# Patient Record
Sex: Male | Born: 1940 | Race: White | Hispanic: No | Marital: Married | Smoking: Former smoker
Health system: Southern US, Community
[De-identification: ages and names within clinical notes are randomized; demographics above are authoritative.]

## PROBLEM LIST (undated history)

## (undated) DIAGNOSIS — E669 Obesity, unspecified: Secondary | ICD-10-CM

## (undated) DIAGNOSIS — G8929 Other chronic pain: Secondary | ICD-10-CM

## (undated) DIAGNOSIS — G4733 Obstructive sleep apnea (adult) (pediatric): Secondary | ICD-10-CM

## (undated) DIAGNOSIS — R569 Unspecified convulsions: Secondary | ICD-10-CM

## (undated) DIAGNOSIS — I69359 Hemiplegia and hemiparesis following cerebral infarction affecting unspecified side: Secondary | ICD-10-CM

## (undated) DIAGNOSIS — F419 Anxiety disorder, unspecified: Secondary | ICD-10-CM

## (undated) DIAGNOSIS — I2510 Atherosclerotic heart disease of native coronary artery without angina pectoris: Secondary | ICD-10-CM

## (undated) DIAGNOSIS — E785 Hyperlipidemia, unspecified: Secondary | ICD-10-CM

## (undated) DIAGNOSIS — M549 Dorsalgia, unspecified: Secondary | ICD-10-CM

## (undated) DIAGNOSIS — I10 Essential (primary) hypertension: Secondary | ICD-10-CM

## (undated) DIAGNOSIS — R7302 Impaired glucose tolerance (oral): Secondary | ICD-10-CM

## (undated) DIAGNOSIS — C189 Malignant neoplasm of colon, unspecified: Secondary | ICD-10-CM

## (undated) DIAGNOSIS — K429 Umbilical hernia without obstruction or gangrene: Secondary | ICD-10-CM

## (undated) DIAGNOSIS — I639 Cerebral infarction, unspecified: Secondary | ICD-10-CM

## (undated) HISTORY — DX: Obesity, unspecified: E66.9

## (undated) HISTORY — DX: Essential (primary) hypertension: I10

## (undated) HISTORY — DX: Impaired glucose tolerance (oral): R73.02

## (undated) HISTORY — DX: Anxiety disorder, unspecified: F41.9

## (undated) HISTORY — PX: HERNIA REPAIR: SHX51

## (undated) HISTORY — DX: Hyperlipidemia, unspecified: E78.5

## (undated) HISTORY — DX: Obstructive sleep apnea (adult) (pediatric): G47.33

## (undated) HISTORY — DX: Atherosclerotic heart disease of native coronary artery without angina pectoris: I25.10

## (undated) HISTORY — DX: Malignant neoplasm of colon, unspecified: C18.9

## (undated) HISTORY — DX: Cerebral infarction, unspecified: I63.9

## (undated) HISTORY — DX: Hemiplegia and hemiparesis following cerebral infarction affecting unspecified side: I69.359

---

## 2002-01-11 DIAGNOSIS — I69359 Hemiplegia and hemiparesis following cerebral infarction affecting unspecified side: Secondary | ICD-10-CM

## 2002-01-11 HISTORY — DX: Hemiplegia and hemiparesis following cerebral infarction affecting unspecified side: I69.359

## 2002-01-18 ENCOUNTER — Inpatient Hospital Stay (HOSPITAL_COMMUNITY)
Admission: AD | Admit: 2002-01-18 | Discharge: 2002-02-19 | Payer: Self-pay | Admitting: Physical Medicine & Rehabilitation

## 2002-01-25 ENCOUNTER — Encounter: Payer: Self-pay | Admitting: Physical Medicine & Rehabilitation

## 2002-03-26 ENCOUNTER — Encounter (HOSPITAL_COMMUNITY)
Admission: RE | Admit: 2002-03-26 | Discharge: 2002-04-25 | Payer: Self-pay | Admitting: Physical Medicine & Rehabilitation

## 2002-04-25 ENCOUNTER — Encounter (HOSPITAL_COMMUNITY)
Admission: RE | Admit: 2002-04-25 | Discharge: 2002-05-25 | Payer: Self-pay | Admitting: Physical Medicine & Rehabilitation

## 2002-05-28 ENCOUNTER — Encounter (HOSPITAL_COMMUNITY)
Admission: RE | Admit: 2002-05-28 | Discharge: 2002-06-27 | Payer: Self-pay | Admitting: Physical Medicine & Rehabilitation

## 2002-06-25 ENCOUNTER — Encounter (HOSPITAL_COMMUNITY)
Admission: RE | Admit: 2002-06-25 | Discharge: 2002-07-25 | Payer: Self-pay | Admitting: Physical Medicine & Rehabilitation

## 2002-07-25 ENCOUNTER — Encounter (HOSPITAL_COMMUNITY)
Admission: RE | Admit: 2002-07-25 | Discharge: 2002-08-24 | Payer: Self-pay | Admitting: Physical Medicine & Rehabilitation

## 2002-08-28 ENCOUNTER — Encounter (HOSPITAL_COMMUNITY)
Admission: RE | Admit: 2002-08-28 | Discharge: 2002-09-27 | Payer: Self-pay | Admitting: Physical Medicine & Rehabilitation

## 2002-09-13 HISTORY — PX: CORONARY ARTERY BYPASS GRAFT: SHX141

## 2002-09-28 ENCOUNTER — Encounter (HOSPITAL_COMMUNITY)
Admission: RE | Admit: 2002-09-28 | Discharge: 2002-10-28 | Payer: Self-pay | Admitting: Physical Medicine & Rehabilitation

## 2002-10-23 ENCOUNTER — Encounter (HOSPITAL_COMMUNITY)
Admission: RE | Admit: 2002-10-23 | Discharge: 2002-11-22 | Payer: Self-pay | Admitting: Physical Medicine & Rehabilitation

## 2003-01-17 ENCOUNTER — Encounter
Admission: RE | Admit: 2003-01-17 | Discharge: 2003-04-17 | Payer: Self-pay | Admitting: Physical Medicine & Rehabilitation

## 2003-05-13 ENCOUNTER — Encounter
Admission: RE | Admit: 2003-05-13 | Discharge: 2003-08-11 | Payer: Self-pay | Admitting: Physical Medicine & Rehabilitation

## 2003-08-28 ENCOUNTER — Encounter
Admission: RE | Admit: 2003-08-28 | Discharge: 2003-11-26 | Payer: Self-pay | Admitting: Physical Medicine & Rehabilitation

## 2003-09-02 ENCOUNTER — Inpatient Hospital Stay (HOSPITAL_COMMUNITY): Admission: EM | Admit: 2003-09-02 | Discharge: 2003-09-18 | Payer: Self-pay | Admitting: *Deleted

## 2003-09-04 ENCOUNTER — Encounter (INDEPENDENT_AMBULATORY_CARE_PROVIDER_SITE_OTHER): Payer: Self-pay | Admitting: Cardiology

## 2003-09-14 HISTORY — PX: OTHER SURGICAL HISTORY: SHX169

## 2003-09-18 ENCOUNTER — Ambulatory Visit (HOSPITAL_COMMUNITY)
Admission: RE | Admit: 2003-09-18 | Discharge: 2003-09-19 | Payer: Self-pay | Admitting: Physical Medicine & Rehabilitation

## 2003-09-18 ENCOUNTER — Inpatient Hospital Stay
Admission: RE | Admit: 2003-09-18 | Discharge: 2003-09-24 | Payer: Self-pay | Admitting: Physical Medicine & Rehabilitation

## 2003-09-24 ENCOUNTER — Ambulatory Visit (HOSPITAL_COMMUNITY)
Admission: RE | Admit: 2003-09-24 | Discharge: 2003-09-24 | Payer: Self-pay | Admitting: Physical Medicine & Rehabilitation

## 2003-10-22 ENCOUNTER — Encounter
Admission: RE | Admit: 2003-10-22 | Discharge: 2003-10-22 | Payer: Self-pay | Admitting: Thoracic Surgery (Cardiothoracic Vascular Surgery)

## 2003-11-14 ENCOUNTER — Encounter
Admission: RE | Admit: 2003-11-14 | Discharge: 2003-11-14 | Payer: Self-pay | Admitting: Thoracic Surgery (Cardiothoracic Vascular Surgery)

## 2003-11-18 ENCOUNTER — Ambulatory Visit (HOSPITAL_COMMUNITY)
Admission: RE | Admit: 2003-11-18 | Discharge: 2003-11-18 | Payer: Self-pay | Admitting: Thoracic Surgery (Cardiothoracic Vascular Surgery)

## 2003-12-05 ENCOUNTER — Encounter
Admission: RE | Admit: 2003-12-05 | Discharge: 2003-12-05 | Payer: Self-pay | Admitting: Thoracic Surgery (Cardiothoracic Vascular Surgery)

## 2004-01-14 ENCOUNTER — Encounter (HOSPITAL_COMMUNITY): Admission: RE | Admit: 2004-01-14 | Discharge: 2004-02-13 | Payer: Self-pay | Admitting: *Deleted

## 2004-02-14 ENCOUNTER — Encounter (HOSPITAL_COMMUNITY): Admission: RE | Admit: 2004-02-14 | Discharge: 2004-03-15 | Payer: Self-pay | Admitting: *Deleted

## 2004-02-25 ENCOUNTER — Encounter
Admission: RE | Admit: 2004-02-25 | Discharge: 2004-05-25 | Payer: Self-pay | Admitting: Physical Medicine & Rehabilitation

## 2004-05-16 ENCOUNTER — Emergency Department (HOSPITAL_COMMUNITY): Admission: EM | Admit: 2004-05-16 | Discharge: 2004-05-16 | Payer: Self-pay | Admitting: Emergency Medicine

## 2004-05-25 ENCOUNTER — Ambulatory Visit (HOSPITAL_COMMUNITY): Admission: RE | Admit: 2004-05-25 | Discharge: 2004-05-25 | Payer: Self-pay | Admitting: General Surgery

## 2004-07-16 ENCOUNTER — Ambulatory Visit: Payer: Self-pay | Admitting: Family Medicine

## 2004-07-22 ENCOUNTER — Encounter
Admission: RE | Admit: 2004-07-22 | Discharge: 2004-09-02 | Payer: Self-pay | Admitting: Physical Medicine & Rehabilitation

## 2004-07-23 ENCOUNTER — Ambulatory Visit: Payer: Self-pay | Admitting: Family Medicine

## 2004-07-27 ENCOUNTER — Ambulatory Visit: Payer: Self-pay | Admitting: Physical Medicine & Rehabilitation

## 2004-09-02 ENCOUNTER — Encounter
Admission: RE | Admit: 2004-09-02 | Discharge: 2004-12-01 | Payer: Self-pay | Admitting: Physical Medicine & Rehabilitation

## 2004-09-09 ENCOUNTER — Ambulatory Visit: Payer: Self-pay | Admitting: Physical Medicine & Rehabilitation

## 2004-11-17 ENCOUNTER — Ambulatory Visit: Payer: Self-pay | Admitting: Family Medicine

## 2005-03-05 ENCOUNTER — Encounter
Admission: RE | Admit: 2005-03-05 | Discharge: 2005-06-03 | Payer: Self-pay | Admitting: Physical Medicine & Rehabilitation

## 2005-03-18 ENCOUNTER — Ambulatory Visit: Payer: Self-pay | Admitting: Family Medicine

## 2005-04-05 ENCOUNTER — Ambulatory Visit: Payer: Self-pay | Admitting: Physical Medicine & Rehabilitation

## 2005-07-05 ENCOUNTER — Ambulatory Visit: Payer: Self-pay | Admitting: Family Medicine

## 2005-08-09 ENCOUNTER — Ambulatory Visit: Payer: Self-pay | Admitting: Family Medicine

## 2005-09-02 ENCOUNTER — Encounter
Admission: RE | Admit: 2005-09-02 | Discharge: 2005-12-01 | Payer: Self-pay | Admitting: Physical Medicine & Rehabilitation

## 2005-09-02 ENCOUNTER — Ambulatory Visit: Payer: Self-pay | Admitting: Physical Medicine & Rehabilitation

## 2005-09-13 HISTORY — PX: OTHER SURGICAL HISTORY: SHX169

## 2005-10-04 ENCOUNTER — Ambulatory Visit: Payer: Self-pay | Admitting: Physical Medicine & Rehabilitation

## 2005-10-08 ENCOUNTER — Encounter (HOSPITAL_COMMUNITY)
Admission: RE | Admit: 2005-10-08 | Discharge: 2005-11-07 | Payer: Self-pay | Admitting: Physical Medicine & Rehabilitation

## 2005-10-12 ENCOUNTER — Ambulatory Visit (HOSPITAL_COMMUNITY): Admission: RE | Admit: 2005-10-12 | Discharge: 2005-10-12 | Payer: Self-pay | Admitting: General Surgery

## 2005-10-12 ENCOUNTER — Encounter (INDEPENDENT_AMBULATORY_CARE_PROVIDER_SITE_OTHER): Payer: Self-pay | Admitting: General Surgery

## 2005-11-01 ENCOUNTER — Inpatient Hospital Stay (HOSPITAL_COMMUNITY): Admission: RE | Admit: 2005-11-01 | Discharge: 2005-11-08 | Payer: Self-pay | Admitting: General Surgery

## 2005-11-01 ENCOUNTER — Encounter (INDEPENDENT_AMBULATORY_CARE_PROVIDER_SITE_OTHER): Payer: Self-pay | Admitting: General Surgery

## 2005-11-30 ENCOUNTER — Emergency Department (HOSPITAL_COMMUNITY): Admission: EM | Admit: 2005-11-30 | Discharge: 2005-11-30 | Payer: Self-pay | Admitting: Emergency Medicine

## 2005-11-30 ENCOUNTER — Emergency Department (HOSPITAL_COMMUNITY): Admission: EM | Admit: 2005-11-30 | Discharge: 2005-12-01 | Payer: Self-pay | Admitting: Emergency Medicine

## 2005-12-01 ENCOUNTER — Ambulatory Visit: Payer: Self-pay | Admitting: Family Medicine

## 2005-12-23 ENCOUNTER — Ambulatory Visit: Payer: Self-pay | Admitting: Physical Medicine & Rehabilitation

## 2005-12-23 ENCOUNTER — Encounter
Admission: RE | Admit: 2005-12-23 | Discharge: 2006-03-23 | Payer: Self-pay | Admitting: Physical Medicine & Rehabilitation

## 2006-01-11 DIAGNOSIS — C189 Malignant neoplasm of colon, unspecified: Secondary | ICD-10-CM

## 2006-01-11 HISTORY — DX: Malignant neoplasm of colon, unspecified: C18.9

## 2006-02-01 ENCOUNTER — Ambulatory Visit: Payer: Self-pay | Admitting: Family Medicine

## 2006-02-03 ENCOUNTER — Ambulatory Visit: Payer: Self-pay | Admitting: Physical Medicine & Rehabilitation

## 2006-04-05 ENCOUNTER — Encounter
Admission: RE | Admit: 2006-04-05 | Discharge: 2006-07-04 | Payer: Self-pay | Admitting: Physical Medicine & Rehabilitation

## 2006-04-05 ENCOUNTER — Ambulatory Visit: Payer: Self-pay | Admitting: Physical Medicine & Rehabilitation

## 2006-04-11 ENCOUNTER — Ambulatory Visit: Payer: Self-pay | Admitting: Family Medicine

## 2006-06-22 ENCOUNTER — Ambulatory Visit: Payer: Self-pay | Admitting: Family Medicine

## 2006-09-20 ENCOUNTER — Ambulatory Visit: Payer: Self-pay | Admitting: Family Medicine

## 2006-09-21 ENCOUNTER — Encounter: Payer: Self-pay | Admitting: Family Medicine

## 2006-09-21 LAB — CONVERTED CEMR LAB
ALT: 16 units/L (ref 0–53)
AST: 18 units/L (ref 0–37)
Albumin: 4.3 g/dL (ref 3.5–5.2)
Alkaline Phosphatase: 40 units/L (ref 39–117)
BUN: 24 mg/dL — ABNORMAL HIGH (ref 6–23)
Bilirubin, Direct: 0.2 mg/dL (ref 0.0–0.3)
CO2: 31 meq/L (ref 19–32)
Calcium: 9.3 mg/dL (ref 8.4–10.5)
Chloride: 101 meq/L (ref 96–112)
Cholesterol: 130 mg/dL (ref 0–200)
Creatinine, Ser: 1.13 mg/dL (ref 0.40–1.50)
Glucose, Bld: 103 mg/dL — ABNORMAL HIGH (ref 70–99)
HDL: 41 mg/dL (ref >39)
Hgb A1c MFr Bld: 5.9 % (ref 4.6–6.1)
Indirect Bilirubin: 0.5 mg/dL (ref 0.0–0.9)
LDL Cholesterol: 67 mg/dL (ref 0–99)
Potassium: 4.3 meq/L (ref 3.5–5.3)
Sodium: 138 meq/L (ref 135–145)
Total Bilirubin: 0.7 mg/dL (ref 0.3–1.2)
Total CHOL/HDL Ratio: 3.2
Total Protein: 7.2 g/dL (ref 6.0–8.3)
Triglycerides: 109 mg/dL (ref <150)
VLDL: 22 mg/dL (ref 0–40)

## 2006-09-26 ENCOUNTER — Encounter
Admission: RE | Admit: 2006-09-26 | Discharge: 2006-12-25 | Payer: Self-pay | Admitting: Physical Medicine & Rehabilitation

## 2006-09-27 ENCOUNTER — Ambulatory Visit (HOSPITAL_COMMUNITY): Admission: RE | Admit: 2006-09-27 | Discharge: 2006-09-27 | Payer: Self-pay | Admitting: Orthopaedic Surgery

## 2006-10-04 ENCOUNTER — Encounter (HOSPITAL_COMMUNITY): Admission: RE | Admit: 2006-10-04 | Discharge: 2006-11-03 | Payer: Self-pay | Admitting: Orthopaedic Surgery

## 2006-11-08 ENCOUNTER — Encounter (HOSPITAL_COMMUNITY): Admission: RE | Admit: 2006-11-08 | Discharge: 2006-12-08 | Payer: Self-pay | Admitting: Orthopaedic Surgery

## 2006-11-15 ENCOUNTER — Encounter (INDEPENDENT_AMBULATORY_CARE_PROVIDER_SITE_OTHER): Payer: Self-pay | Admitting: Specialist

## 2006-11-15 ENCOUNTER — Ambulatory Visit (HOSPITAL_COMMUNITY): Admission: RE | Admit: 2006-11-15 | Discharge: 2006-11-15 | Payer: Self-pay | Admitting: General Surgery

## 2006-12-12 ENCOUNTER — Encounter (HOSPITAL_COMMUNITY): Admission: RE | Admit: 2006-12-12 | Discharge: 2007-01-12 | Payer: Self-pay | Admitting: Orthopaedic Surgery

## 2007-01-04 ENCOUNTER — Ambulatory Visit: Payer: Self-pay | Admitting: Family Medicine

## 2007-01-10 ENCOUNTER — Ambulatory Visit: Payer: Self-pay | Admitting: Family Medicine

## 2007-01-13 ENCOUNTER — Encounter
Admission: RE | Admit: 2007-01-13 | Discharge: 2007-04-13 | Payer: Self-pay | Admitting: Physical Medicine & Rehabilitation

## 2007-01-13 ENCOUNTER — Ambulatory Visit: Payer: Self-pay | Admitting: Physical Medicine & Rehabilitation

## 2007-03-09 ENCOUNTER — Ambulatory Visit: Payer: Self-pay | Admitting: Physical Medicine & Rehabilitation

## 2007-03-22 ENCOUNTER — Encounter: Payer: Self-pay | Admitting: Family Medicine

## 2007-03-22 LAB — CONVERTED CEMR LAB
ALT: 16 units/L (ref 0–53)
AST: 16 units/L (ref 0–37)
Albumin: 4.6 g/dL (ref 3.5–5.2)
Alkaline Phosphatase: 36 units/L — ABNORMAL LOW (ref 39–117)
BUN: 26 mg/dL — ABNORMAL HIGH (ref 6–23)
Basophils Absolute: 0 10*3/uL (ref 0.0–0.1)
Basophils Relative: 0 % (ref 0–1)
Bilirubin, Direct: 0.1 mg/dL (ref 0.0–0.3)
CO2: 25 meq/L (ref 19–32)
Calcium: 9.6 mg/dL (ref 8.4–10.5)
Chloride: 104 meq/L (ref 96–112)
Cholesterol: 171 mg/dL (ref 0–200)
Creatinine, Ser: 1.24 mg/dL (ref 0.40–1.50)
Eosinophils Absolute: 0.2 10*3/uL (ref 0.0–0.7)
Eosinophils Relative: 3 % (ref 0–5)
Glucose, Bld: 99 mg/dL (ref 70–99)
HCT: 46 % (ref 39.0–52.0)
HDL: 52 mg/dL (ref >39)
Hemoglobin: 14.9 g/dL (ref 13.0–17.0)
Indirect Bilirubin: 0.3 mg/dL (ref 0.0–0.9)
LDL Cholesterol: 95 mg/dL (ref 0–99)
Lymphocytes Relative: 22 % (ref 12–46)
Lymphs Abs: 2 10*3/uL (ref 0.7–3.3)
MCHC: 32.4 g/dL (ref 30.0–36.0)
MCV: 93.9 fL (ref 78.0–100.0)
Monocytes Absolute: 0.6 10*3/uL (ref 0.2–0.7)
Monocytes Relative: 7 % (ref 3–11)
Neutro Abs: 6.1 10*3/uL (ref 1.7–7.7)
Neutrophils Relative %: 68 % (ref 43–77)
Platelets: 186 10*3/uL (ref 150–400)
Potassium: 4.4 meq/L (ref 3.5–5.3)
RBC: 4.9 M/uL (ref 4.22–5.81)
RDW: 13.6 % (ref 11.5–14.0)
Sodium: 141 meq/L (ref 135–145)
TSH: 2.356 microintl units/mL (ref 0.350–5.50)
Total Bilirubin: 0.4 mg/dL (ref 0.3–1.2)
Total CHOL/HDL Ratio: 3.3
Total Protein: 7.4 g/dL (ref 6.0–8.3)
Triglycerides: 118 mg/dL (ref <150)
VLDL: 24 mg/dL (ref 0–40)
WBC: 8.9 10*3/uL (ref 4.0–10.5)

## 2007-05-08 ENCOUNTER — Encounter
Admission: RE | Admit: 2007-05-08 | Discharge: 2007-06-13 | Payer: Self-pay | Admitting: Physical Medicine & Rehabilitation

## 2007-05-08 ENCOUNTER — Ambulatory Visit: Payer: Self-pay | Admitting: Physical Medicine & Rehabilitation

## 2007-06-14 ENCOUNTER — Ambulatory Visit: Payer: Self-pay | Admitting: Family Medicine

## 2007-06-14 LAB — CONVERTED CEMR LAB: PSA: 1.88 ng/mL (ref 0.10–4.00)

## 2007-08-08 ENCOUNTER — Ambulatory Visit: Payer: Self-pay | Admitting: Physical Medicine & Rehabilitation

## 2007-08-08 ENCOUNTER — Encounter
Admission: RE | Admit: 2007-08-08 | Discharge: 2007-08-15 | Payer: Self-pay | Admitting: Physical Medicine & Rehabilitation

## 2007-09-26 ENCOUNTER — Encounter: Payer: Self-pay | Admitting: Family Medicine

## 2007-09-26 LAB — CONVERTED CEMR LAB
ALT: 20 units/L (ref 0–53)
AST: 17 units/L (ref 0–37)
Albumin: 4.4 g/dL (ref 3.5–5.2)
Alkaline Phosphatase: 41 units/L (ref 39–117)
BUN: 24 mg/dL — ABNORMAL HIGH (ref 6–23)
Bilirubin, Direct: 0.1 mg/dL (ref 0.0–0.3)
CO2: 26 meq/L (ref 19–32)
Calcium: 9.4 mg/dL (ref 8.4–10.5)
Chloride: 103 meq/L (ref 96–112)
Cholesterol: 163 mg/dL (ref 0–200)
Creatinine, Ser: 1.14 mg/dL (ref 0.40–1.50)
Glucose, Bld: 100 mg/dL — ABNORMAL HIGH (ref 70–99)
HDL: 47 mg/dL (ref >39)
Indirect Bilirubin: 0.5 mg/dL (ref 0.0–0.9)
LDL Cholesterol: 90 mg/dL (ref 0–99)
Potassium: 4.3 meq/L (ref 3.5–5.3)
Sodium: 140 meq/L (ref 135–145)
Total Bilirubin: 0.6 mg/dL (ref 0.3–1.2)
Total CHOL/HDL Ratio: 3.5
Total Protein: 7.1 g/dL (ref 6.0–8.3)
Triglycerides: 131 mg/dL (ref <150)
VLDL: 26 mg/dL (ref 0–40)

## 2007-10-08 ENCOUNTER — Emergency Department (HOSPITAL_COMMUNITY): Admission: EM | Admit: 2007-10-08 | Discharge: 2007-10-08 | Payer: Self-pay | Admitting: Emergency Medicine

## 2007-11-06 ENCOUNTER — Ambulatory Visit (HOSPITAL_COMMUNITY): Admission: RE | Admit: 2007-11-06 | Discharge: 2007-11-06 | Payer: Self-pay | Admitting: *Deleted

## 2007-11-09 ENCOUNTER — Ambulatory Visit: Payer: Self-pay | Admitting: Family Medicine

## 2008-01-31 ENCOUNTER — Emergency Department (HOSPITAL_COMMUNITY): Admission: EM | Admit: 2008-01-31 | Discharge: 2008-01-31 | Payer: Self-pay | Admitting: Emergency Medicine

## 2008-02-14 ENCOUNTER — Ambulatory Visit: Payer: Self-pay | Admitting: Family Medicine

## 2008-02-21 DIAGNOSIS — I2510 Atherosclerotic heart disease of native coronary artery without angina pectoris: Secondary | ICD-10-CM

## 2008-02-21 DIAGNOSIS — C189 Malignant neoplasm of colon, unspecified: Secondary | ICD-10-CM | POA: Insufficient documentation

## 2008-02-21 DIAGNOSIS — G4733 Obstructive sleep apnea (adult) (pediatric): Secondary | ICD-10-CM | POA: Insufficient documentation

## 2008-02-21 DIAGNOSIS — I10 Essential (primary) hypertension: Secondary | ICD-10-CM

## 2008-02-21 DIAGNOSIS — E785 Hyperlipidemia, unspecified: Secondary | ICD-10-CM

## 2008-02-21 DIAGNOSIS — G4730 Sleep apnea, unspecified: Secondary | ICD-10-CM

## 2008-02-21 DIAGNOSIS — E669 Obesity, unspecified: Secondary | ICD-10-CM | POA: Insufficient documentation

## 2008-05-29 ENCOUNTER — Ambulatory Visit: Payer: Self-pay | Admitting: Family Medicine

## 2008-05-29 LAB — CONVERTED CEMR LAB: Glucose, Bld: 102 mg/dL

## 2008-06-06 ENCOUNTER — Encounter: Payer: Self-pay | Admitting: Family Medicine

## 2008-06-10 ENCOUNTER — Encounter: Payer: Self-pay | Admitting: Family Medicine

## 2008-06-13 ENCOUNTER — Encounter: Payer: Self-pay | Admitting: Family Medicine

## 2008-06-14 ENCOUNTER — Encounter: Payer: Self-pay | Admitting: Family Medicine

## 2008-07-05 ENCOUNTER — Encounter: Payer: Self-pay | Admitting: Family Medicine

## 2008-07-05 LAB — CONVERTED CEMR LAB: PSA: 2.67 ng/mL (ref 0.10–4.00)

## 2008-07-11 ENCOUNTER — Telehealth: Payer: Self-pay | Admitting: Family Medicine

## 2008-07-29 ENCOUNTER — Encounter: Payer: Self-pay | Admitting: Family Medicine

## 2008-08-05 ENCOUNTER — Encounter: Payer: Self-pay | Admitting: Family Medicine

## 2008-08-27 ENCOUNTER — Ambulatory Visit: Payer: Self-pay | Admitting: Family Medicine

## 2008-08-30 ENCOUNTER — Encounter: Payer: Self-pay | Admitting: Family Medicine

## 2008-09-24 ENCOUNTER — Encounter: Payer: Self-pay | Admitting: Family Medicine

## 2008-10-28 ENCOUNTER — Encounter: Payer: Self-pay | Admitting: Family Medicine

## 2008-11-05 ENCOUNTER — Ambulatory Visit: Payer: Self-pay | Admitting: Family Medicine

## 2008-11-05 DIAGNOSIS — I69959 Hemiplegia and hemiparesis following unspecified cerebrovascular disease affecting unspecified side: Secondary | ICD-10-CM

## 2008-11-06 LAB — CONVERTED CEMR LAB
Basophils Absolute: 0 10*3/uL (ref 0.0–0.1)
Basophils Relative: 0 % (ref 0–1)
Eosinophils Absolute: 0.2 10*3/uL (ref 0.0–0.7)
Eosinophils Relative: 2 % (ref 0–5)
HCT: 46.4 % (ref 39.0–52.0)
Hemoglobin: 15.6 g/dL (ref 13.0–17.0)
Lymphocytes Relative: 19 % (ref 12–46)
Lymphs Abs: 1.5 10*3/uL (ref 0.7–4.0)
MCHC: 33.6 g/dL (ref 30.0–36.0)
MCV: 91.9 fL (ref 78.0–100.0)
Monocytes Absolute: 0.6 10*3/uL (ref 0.1–1.0)
Monocytes Relative: 8 % (ref 3–12)
Neutro Abs: 5.6 10*3/uL (ref 1.7–7.7)
Neutrophils Relative %: 71 % (ref 43–77)
Platelets: 176 10*3/uL (ref 150–400)
RBC: 5.05 M/uL (ref 4.22–5.81)
RDW: 13.4 % (ref 11.5–15.5)
TSH: 2.281 microintl units/mL (ref 0.350–4.50)
WBC: 7.9 10*3/uL (ref 4.0–10.5)

## 2008-11-18 ENCOUNTER — Encounter: Payer: Self-pay | Admitting: Family Medicine

## 2008-11-26 ENCOUNTER — Encounter
Admission: RE | Admit: 2008-11-26 | Discharge: 2008-11-27 | Payer: Self-pay | Admitting: Physical Medicine & Rehabilitation

## 2008-11-27 ENCOUNTER — Ambulatory Visit: Payer: Self-pay | Admitting: Physical Medicine & Rehabilitation

## 2008-12-02 ENCOUNTER — Encounter: Payer: Self-pay | Admitting: Family Medicine

## 2008-12-02 LAB — CONVERTED CEMR LAB
OCCULT 1: NEGATIVE
OCCULT 2: NEGATIVE
OCCULT 3: NEGATIVE

## 2008-12-10 ENCOUNTER — Telehealth: Payer: Self-pay | Admitting: Family Medicine

## 2008-12-17 ENCOUNTER — Encounter: Payer: Self-pay | Admitting: Family Medicine

## 2008-12-17 ENCOUNTER — Telehealth: Payer: Self-pay | Admitting: Family Medicine

## 2009-01-02 ENCOUNTER — Encounter: Payer: Self-pay | Admitting: Family Medicine

## 2009-01-13 ENCOUNTER — Encounter
Admission: RE | Admit: 2009-01-13 | Discharge: 2009-01-13 | Payer: Self-pay | Admitting: Physical Medicine & Rehabilitation

## 2009-01-13 ENCOUNTER — Ambulatory Visit: Payer: Self-pay | Admitting: Physical Medicine & Rehabilitation

## 2009-01-17 ENCOUNTER — Telehealth: Payer: Self-pay | Admitting: Family Medicine

## 2009-01-21 ENCOUNTER — Encounter: Payer: Self-pay | Admitting: Family Medicine

## 2009-01-27 ENCOUNTER — Encounter (HOSPITAL_COMMUNITY)
Admission: RE | Admit: 2009-01-27 | Discharge: 2009-02-26 | Payer: Self-pay | Admitting: Physical Medicine & Rehabilitation

## 2009-02-19 ENCOUNTER — Ambulatory Visit: Payer: Self-pay | Admitting: Family Medicine

## 2009-02-27 ENCOUNTER — Encounter: Payer: Self-pay | Admitting: Family Medicine

## 2009-03-05 ENCOUNTER — Encounter
Admission: RE | Admit: 2009-03-05 | Discharge: 2009-06-03 | Payer: Self-pay | Admitting: Physical Medicine & Rehabilitation

## 2009-03-10 ENCOUNTER — Ambulatory Visit: Payer: Self-pay | Admitting: Physical Medicine & Rehabilitation

## 2009-03-21 ENCOUNTER — Encounter: Payer: Self-pay | Admitting: Family Medicine

## 2009-03-24 ENCOUNTER — Telehealth: Payer: Self-pay | Admitting: Family Medicine

## 2009-04-08 ENCOUNTER — Ambulatory Visit: Payer: Self-pay | Admitting: Physical Medicine & Rehabilitation

## 2009-05-28 ENCOUNTER — Encounter
Admission: RE | Admit: 2009-05-28 | Discharge: 2009-05-30 | Payer: Self-pay | Admitting: Physical Medicine & Rehabilitation

## 2009-05-30 ENCOUNTER — Ambulatory Visit: Payer: Self-pay | Admitting: Physical Medicine & Rehabilitation

## 2009-06-24 ENCOUNTER — Ambulatory Visit: Payer: Self-pay | Admitting: Family Medicine

## 2009-06-24 LAB — CONVERTED CEMR LAB: Hgb A1c MFr Bld: 6 %

## 2009-07-01 ENCOUNTER — Encounter: Payer: Self-pay | Admitting: Family Medicine

## 2009-07-09 ENCOUNTER — Encounter: Payer: Self-pay | Admitting: Family Medicine

## 2009-07-10 ENCOUNTER — Encounter: Payer: Self-pay | Admitting: Family Medicine

## 2009-07-14 LAB — CONVERTED CEMR LAB
ALT: 15 units/L (ref 0–53)
AST: 16 units/L (ref 0–37)
Albumin: 4.5 g/dL (ref 3.5–5.2)
Alkaline Phosphatase: 40 units/L (ref 39–117)
BUN: 25 mg/dL — ABNORMAL HIGH (ref 6–23)
Bilirubin, Direct: 0.1 mg/dL (ref 0.0–0.3)
CO2: 25 meq/L (ref 19–32)
Calcium: 9.9 mg/dL (ref 8.4–10.5)
Chloride: 104 meq/L (ref 96–112)
Cholesterol: 145 mg/dL (ref 0–200)
Creatinine, Ser: 1.07 mg/dL (ref 0.40–1.50)
Glucose, Bld: 103 mg/dL — ABNORMAL HIGH (ref 70–99)
HDL: 43 mg/dL (ref >39)
Indirect Bilirubin: 0.5 mg/dL (ref 0.0–0.9)
LDL Cholesterol: 77 mg/dL (ref 0–99)
PSA: 2.52 ng/mL (ref 0.10–4.00)
Potassium: 4.3 meq/L (ref 3.5–5.3)
Sodium: 142 meq/L (ref 135–145)
Total Bilirubin: 0.6 mg/dL (ref 0.3–1.2)
Total CHOL/HDL Ratio: 3.4
Total Protein: 7.1 g/dL (ref 6.0–8.3)
Triglycerides: 124 mg/dL (ref <150)
VLDL: 25 mg/dL (ref 0–40)

## 2009-08-20 ENCOUNTER — Ambulatory Visit: Payer: Self-pay | Admitting: Physical Medicine & Rehabilitation

## 2009-08-20 ENCOUNTER — Encounter
Admission: RE | Admit: 2009-08-20 | Discharge: 2009-09-10 | Payer: Self-pay | Admitting: Physical Medicine & Rehabilitation

## 2009-08-26 ENCOUNTER — Telehealth: Payer: Self-pay | Admitting: Family Medicine

## 2009-11-10 ENCOUNTER — Encounter: Payer: Self-pay | Admitting: Family Medicine

## 2009-11-17 LAB — CONVERTED CEMR LAB
ALT: 17 units/L (ref 0–53)
AST: 19 units/L (ref 0–37)
Albumin: 4.6 g/dL (ref 3.5–5.2)
Alkaline Phosphatase: 41 units/L (ref 39–117)
BUN: 30 mg/dL — ABNORMAL HIGH (ref 6–23)
Basophils Absolute: 0 10*3/uL (ref 0.0–0.1)
Basophils Relative: 0 % (ref 0–1)
Bilirubin, Direct: 0.1 mg/dL (ref 0.0–0.3)
CO2: 29 meq/L (ref 19–32)
Calcium: 9.8 mg/dL (ref 8.4–10.5)
Chloride: 100 meq/L (ref 96–112)
Cholesterol: 231 mg/dL — ABNORMAL HIGH (ref 0–200)
Creatinine, Ser: 0.93 mg/dL (ref 0.40–1.50)
Eosinophils Absolute: 0.2 10*3/uL (ref 0.0–0.7)
Eosinophils Relative: 3 % (ref 0–5)
Glucose, Bld: 76 mg/dL (ref 70–99)
HCT: 47.9 % (ref 39.0–52.0)
HDL: 44 mg/dL (ref >39)
Hemoglobin: 15.5 g/dL (ref 13.0–17.0)
Indirect Bilirubin: 0.5 mg/dL (ref 0.0–0.9)
LDL Cholesterol: 161 mg/dL — ABNORMAL HIGH (ref 0–99)
Lymphocytes Relative: 26 % (ref 12–46)
Lymphs Abs: 2.1 10*3/uL (ref 0.7–4.0)
MCHC: 32.4 g/dL (ref 30.0–36.0)
MCV: 95.2 fL (ref 78.0–100.0)
Monocytes Absolute: 0.7 10*3/uL (ref 0.1–1.0)
Monocytes Relative: 9 % (ref 3–12)
Neutro Abs: 4.9 10*3/uL (ref 1.7–7.7)
Neutrophils Relative %: 62 % (ref 43–77)
Platelets: 188 10*3/uL (ref 150–400)
Potassium: 4.5 meq/L (ref 3.5–5.3)
RBC: 5.03 M/uL (ref 4.22–5.81)
RDW: 14.3 % (ref 11.5–15.5)
Sodium: 141 meq/L (ref 135–145)
Total Bilirubin: 0.6 mg/dL (ref 0.3–1.2)
Total CHOL/HDL Ratio: 5.3
Total Protein: 7 g/dL (ref 6.0–8.3)
Triglycerides: 131 mg/dL (ref <150)
VLDL: 26 mg/dL (ref 0–40)
WBC: 7.9 10*3/uL (ref 4.0–10.5)

## 2009-11-19 ENCOUNTER — Ambulatory Visit: Payer: Self-pay | Admitting: Family Medicine

## 2009-12-15 ENCOUNTER — Encounter: Payer: Self-pay | Admitting: Family Medicine

## 2010-01-07 ENCOUNTER — Encounter
Admission: RE | Admit: 2010-01-07 | Discharge: 2010-01-07 | Payer: Self-pay | Admitting: Physical Medicine & Rehabilitation

## 2010-02-03 ENCOUNTER — Ambulatory Visit (HOSPITAL_COMMUNITY): Admission: RE | Admit: 2010-02-03 | Discharge: 2010-02-03 | Payer: Self-pay | Admitting: General Surgery

## 2010-03-30 ENCOUNTER — Encounter
Admission: RE | Admit: 2010-03-30 | Discharge: 2010-04-07 | Payer: Self-pay | Admitting: Physical Medicine & Rehabilitation

## 2010-04-07 ENCOUNTER — Ambulatory Visit: Payer: Self-pay | Admitting: Physical Medicine & Rehabilitation

## 2010-04-13 ENCOUNTER — Encounter (HOSPITAL_COMMUNITY)
Admission: RE | Admit: 2010-04-13 | Discharge: 2010-05-13 | Payer: Self-pay | Admitting: Physical Medicine & Rehabilitation

## 2010-04-21 ENCOUNTER — Ambulatory Visit: Payer: Self-pay | Admitting: Family Medicine

## 2010-04-28 ENCOUNTER — Telehealth: Payer: Self-pay | Admitting: Family Medicine

## 2010-04-28 LAB — CONVERTED CEMR LAB
Alkaline Phosphatase: 37 units/L — ABNORMAL LOW (ref 39–117)
BUN: 23 mg/dL (ref 6–23)
Bilirubin, Direct: 0.1 mg/dL (ref 0.0–0.3)
CO2: 30 meq/L (ref 19–32)
Chloride: 102 meq/L (ref 96–112)
Creatinine, Ser: 1.15 mg/dL (ref 0.40–1.50)
Glucose, Bld: 95 mg/dL (ref 70–99)
Indirect Bilirubin: 0.5 mg/dL (ref 0.0–0.9)
LDL Cholesterol: 81 mg/dL (ref 0–99)
Potassium: 4.2 meq/L (ref 3.5–5.3)
Total Bilirubin: 0.6 mg/dL (ref 0.3–1.2)
Total Protein: 7 g/dL (ref 6.0–8.3)
Triglycerides: 151 mg/dL — ABNORMAL HIGH (ref <150)
VLDL: 30 mg/dL (ref 0–40)

## 2010-04-30 ENCOUNTER — Ambulatory Visit: Payer: Self-pay | Admitting: Otolaryngology

## 2010-04-30 ENCOUNTER — Ambulatory Visit: Payer: Self-pay | Admitting: Family Medicine

## 2010-04-30 ENCOUNTER — Telehealth: Payer: Self-pay | Admitting: Physician Assistant

## 2010-06-30 ENCOUNTER — Ambulatory Visit: Payer: Self-pay | Admitting: Family Medicine

## 2010-07-24 ENCOUNTER — Ambulatory Visit: Payer: Self-pay | Admitting: Family Medicine

## 2010-07-26 DIAGNOSIS — M549 Dorsalgia, unspecified: Secondary | ICD-10-CM

## 2010-07-27 ENCOUNTER — Encounter: Payer: Self-pay | Admitting: Family Medicine

## 2010-07-27 LAB — CONVERTED CEMR LAB: PSA: 2.17 ng/mL (ref <=4.00)

## 2010-08-03 ENCOUNTER — Encounter: Payer: Self-pay | Admitting: Family Medicine

## 2010-08-18 ENCOUNTER — Telehealth: Payer: Self-pay | Admitting: Family Medicine

## 2010-09-22 ENCOUNTER — Ambulatory Visit: Admit: 2010-09-22 | Payer: Self-pay | Admitting: Physical Medicine & Rehabilitation

## 2010-10-13 NOTE — Assessment & Plan Note (Signed)
Summary: FLU SHOT/SLJ  Nurse Visit   Allergies: 1)  ! * Niaspan  Immunizations Administered:  Influenza Vaccine:    Vaccine Type: Fluvax MCR    Site: right deltoid    Mfr: novartis    Dose: 0.5 ml    Route: IM    Given by: Mauricia Area CMA    Exp. Date: 01/2011    Lot #: [id number]    VIS given: 04/07/10 version given June 30, 2010.  Orders Added: 1)  Influenza Vaccine MCR [00025]   Immunizations Administered:  Influenza Vaccine:    Vaccine Type: Fluvax MCR    Site: right deltoid    Mfr: novartis    Dose: 0.5 ml    Route: IM    Given by: Mauricia Area CMA    Exp. Date: 01/2011    Lot #: [id number]    VIS given: 04/07/10 version given June 30, 2010.

## 2010-10-13 NOTE — Miscellaneous (Signed)
Summary: refill  Clinical Lists Changes  Medications: Changed medication from HYDROCODONE-ACETAMINOPHEN 5-500 MG TABS (HYDROCODONE-ACETAMINOPHEN) Take 1 tablet by mouth three times a day  as needed to HYDROCODONE-ACETAMINOPHEN 5-500 MG TABS (HYDROCODONE-ACETAMINOPHEN) Take 1 tablet by mouth three times a day  as needed - Signed Rx of HYDROCODONE-ACETAMINOPHEN 5-500 MG TABS (HYDROCODONE-ACETAMINOPHEN) Take 1 tablet by mouth three times a day  as needed;  #90 x 0;  Signed;  Entered by: Everitt Amber LPN;  Authorized by: Syliva Overman MD;  Method used: Printed then [facility] to [facility]. [phone]*, [address], [place], [place], [place]  [address], Ph: [phone] or [phone], Fax: [phone]    Prescriptions: HYDROCODONE-ACETAMINOPHEN 5-500 MG TABS (HYDROCODONE-ACETAMINOPHEN) Take 1 tablet by mouth three times a day  as needed  #90 x 0   Entered by:   Everitt Amber LPN   Authorized by:   Syliva Overman MD   Signed by:   Everitt Amber LPN on 52/84/1324   Method used:   Printed then faxed to ...       [facility]  [address] [phone]* (retail)       [address]       [place], [place]  [address]       Ph: [phone] or [phone]       Fax: [phone]   RxID:   [id number]

## 2010-10-13 NOTE — Miscellaneous (Signed)
Summary: med add  Clinical Lists Changes  Medications: Added new medication of CLOTRIMAZOLE-BETAMETHASONE 1-0.05 % CREA (CLOTRIMAZOLE-BETAMETHASONE) Apply topically to affected area two times a day as needed - Signed Rx of CLOTRIMAZOLE-BETAMETHASONE 1-0.05 % CREA (CLOTRIMAZOLE-BETAMETHASONE) Apply topically to affected area two times a day as needed;  #45gm x 0;  Signed;  Entered by: Everitt Amber LPN;  Authorized by: Syliva Overman MD;  Method used: Handwritten    Prescriptions: CLOTRIMAZOLE-BETAMETHASONE 1-0.05 % CREA (CLOTRIMAZOLE-BETAMETHASONE) Apply topically to affected area two times a day as needed  #45gm x 0   Entered by:   Everitt Amber LPN   Authorized by:   Syliva Overman MD   Signed by:   Everitt Amber LPN on 16/06/9603   Method used:   Handwritten   RxID:   [id number]

## 2010-10-13 NOTE — Progress Notes (Signed)
Summary: DR. Suszanne Conners  DR. TEOH   Imported By: Lind Guest 05/07/2010 08:14:48  _____________________________________________________________________  External Attachment:    Type:   Image     Comment:   External Document

## 2010-10-13 NOTE — Assessment & Plan Note (Signed)
Summary: right ear ROOM 1    Vital Signs:  Patient profile:   70 year old male Height:      69 inches Weight:      264.75 pounds BMI:     39.24 O2 Sat:      98 % Pulse rate:   74 / minute Resp:     16 per minute BP sitting:   134 / 78  (left arm) Cuff size:   large  Vitals Entered By: Everitt Amber LPN (April 30, 2010 1:21 PM) CC: Patient was using Q-tip in right ear lastnight and it started bleeding off and on until this morning. He was unable to hear out of ear yesterday during the bleeding but now he can    CC:  Patient was using Q-tip in right ear lastnight and it started bleeding off and on until this morning. He was unable to hear out of ear yesterday during the bleeding but now he can .  History of Present Illness: Pt presents today with his wife.  States he used a Qtip to clean his ear yesterday.  No pain.  Later his wife noticed dried blood in his ear. He has been cleaning it away but continues. Had a hard time hearing this morning until the clot came out.  Has noticed more bleeding today too.  Still no pain.  Pt is on Plavix.  He is aware that he bleeds much easier due to this blood thinner. He is also aware that he shouldnt put anything in his ear.  Allergies: 1)  ! * Niaspan  Past History:  Past medical history reviewed for relevance to current acute and chronic problems.  Past Medical History: Reviewed history from 11/05/2008 and no changes required. 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 ENT:  Denies earache and nasal congestion.  Physical Exam  General:  Well-developed,well-nourished,in no acute distress; alert,appropriate and cooperative throughout examination Head:  Normocephalic and  atraumatic without obvious abnormalities. No apparent alopecia or balding. Ears:  L ear normal.  R EAC mostly obstructed with blood clot.  Dried blood noted too external ear.  Portion of the clot was removed with a curette.  TM appears nl.  There is irrg whitish matter in lower mid section of EAC with surrounding clot.  Uncertain if this is from abrasion, mass or cerumen.  The remainder of the EAC appears nl. Nose:  no external deformity and no nasal discharge.   Neck:  No deformities, masses, or tenderness noted. Lungs:  Normal respiratory effort, chest expands symmetrically. Lungs are clear to auscultation, no crackles or wheezes. Heart:  Normal rate and regular rhythm. S1 and S2 normal without gallop, murmur, click, rub or other extra sounds. Cervical Nodes:  No lymphadenopathy noted   Impression & Recommendations:  Problem # 1:  OPEN WOUND EAR PART UNSPEC WITHOUT MENTION COMP (ICD-872.8) Assessment New Suspect abrasion wound to EAC with persistent bleeding secondary to Plavix. Was able to get pt an appt this afternoon with ENT.  He was to go immed to his office for eval.  Complete Medication List: 1)  [facility] Spectravite Tabs (Multiple vitamins-minerals) .... Take 1 tablet by mouth once a day 2)  Triamterene-hctz 37.5-25 Mg Tabs (Triamterene-hctz) .... Take 1 tablet by mouth once a day 3)  Metoprolol Tartrate 50 Mg Tabs (Metoprolol tartrate) .Marland KitchenMarland KitchenMarland Kitchen  Take one half tab two times a day 4)  Lisinopril 10 Mg Tabs (Lisinopril) .... Take 1 tablet by mouth once a day 5)  Aspirin 81 Mg Tbec (Aspirin) .... Take 2 tablets by mouth once a day 6)  Plavix 75 Mg Tabs (Clopidogrel bisulfate) .... Take 1 tablet by mouth once a day 7)  Klor-con M20 20 Meq Cr-tabs (Potassium chloride crys cr) .... Take two by mouth daily 8)  Fish Oil 1000 Mg Caps (Omega-3 fatty acids) .... Take 2 pills daily 9)  Tizanidine Hcl 4 Mg Tabs (Tizanidine hcl) .... Take one tab every 6 hours 10)  Hydrocodone-acetaminophen 5-500 Mg Tabs  (Hydrocodone-acetaminophen) .... Take 1 tablet by mouth three times a day  as needed 11)  Co Q-10 200 Mg Caps (Coenzyme q10) .... One cap by mouth qd 12)  Gabapentin 300 Mg Caps (Gabapentin) .... One cap by mouth tid 13)  Senna-plus 8.6-50 Mg Tabs (Sennosides-docusate sodium) .... Three tabs by mouth qd 14)  Clotrimazole-betamethasone 1-0.05 % Crea (Clotrimazole-betamethasone) .... Apply topically to affected area two times a day as needed 15)  Simvastatin 20 Mg Tabs (Simvastatin) .... Take 1 tab by mouth at bedtime (take as able)  Patient Instructions: 1)  Keep your next appt with DrSimpson 2)  I have arranged for you to see the ENT dr today. He is going to see you right away.  Go downstairs to [address] - Dr Simpson's old office.

## 2010-10-13 NOTE — Progress Notes (Signed)
Summary: LAB WORK  Phone Note Call from Patient   Summary of Call: WANTS YOU TO FAX HIS WORK TO [facility] WILL PICK UP A COPY OF HIS LAB WORK WANTS YOU TO HAVE IT READY Initial call taken by: Lind Guest,  April 28, 2010 9:22 AM  Follow-up for Phone Call        faxed Follow-up by: Adella Hare LPN,  April 29, 2010 9:06 AM

## 2010-10-13 NOTE — Progress Notes (Signed)
Summary: right ear bleeding  Phone Note Call from Patient   Summary of Call: pt needs to speak with nurse about right ear. cleaned it out yesterday and it started bleeding. bleed off and on through nite. but this morning he can hear and ear not bleeding. [phone] Initial call taken by: Rudene Anda,  April 30, 2010 9:04 AM  Follow-up for Phone Call        patient is on plavix but this morning its not bleeding anymore. You don't need to see him for that unless it starts back bleeding do you? Follow-up by: Everitt Amber LPN,  April 30, 2010 9:24 AM  Additional Follow-up for Phone Call Additional follow up Details #1::        I recommend he have it looked at.  Especially if he is having any pain or discomfort at all in that ear. Additional Follow-up by: Esperanza Sheets PA,  April 30, 2010 9:45 AM    Additional Follow-up for Phone Call Additional follow up Details #2::    Was using a Q-tip lastnight to clean the ear and it started bleeding, he couldn't hear out of that ear from the blood but he thinks it all came out this morning and he can hear again. Made app with Dawn to have it checked to make sure he didn't do any damage  Follow-up by: Everitt Amber LPN,  April 30, 2010 9:50 AM

## 2010-10-13 NOTE — Progress Notes (Signed)
Summary: medication  Phone Note From Other Clinic   Summary of Call: [facility] states you sent them a prescription to them, I am sorry I didn't get the name of the prescription. Initial call taken by: Curtis Sites,  August 18, 2010 9:27 AM    Prescriptions: PLAVIX 75 MG  TABS (CLOPIDOGREL BISULFATE) Take 1 tablet by mouth once a day  #90 x 3   Entered by:   Adella Hare LPN   Authorized by:   Syliva Overman MD   Signed by:   Adella Hare LPN on 34/74/2595   Method used:   Printed then [facility]ed [facility] ...       [facility]  [address] [phone]* (retail)       [address]       [place], [place]  [address]       Ph: [phone] or [phone]       Fax: [phone]   RxID:   [id number]

## 2010-10-13 NOTE — Assessment & Plan Note (Signed)
Summary: FOLLOW UP   Vital Signs:  Patient profile:   70 year old male Height:      69 inches Weight:      267.25 pounds BMI:     39.61 O2 Sat:      97 % Pulse rate:   51 / minute Pulse rhythm:   regular Resp:     16 per minute BP sitting:   120 / 80  (right arm) Cuff size:   large  Vitals Entered By: Everitt Amber LPN (November 19, 9145 11:21 AM)  Nutrition Counseling: Patient's BMI is greater than 25 and therefore counseled on weight management options. CC: Follow up chronic problems Is Patient Diabetic? No   CC:  Follow up chronic problems.  History of Present Illness: Pt  reports that he has started having pain in the legs since trying to resume a cholesterol med, so he is unable to continue the statins. He deneis any recent chest painor palpitations, PND orthopnea or leg swelling. He denies symptoms of uncontrolled depression or anxiety. He denies any redcent feve or chills, head or chest congestion, dysuri or frequency. He denies polyuria, polydypsia, blurredvision.     Current Medications (verified): 1)  [facility] Spectravite   Tabs (Multiple Vitamins-Minerals) .... Take 1 Tablet By Mouth Once A Day 2)  Triamterene-Hctz 37.5-25 Mg  Tabs (Triamterene-Hctz) .... Take 1 Tablet By Mouth Once A Day 3)  Metoprolol Tartrate 50 Mg  Tabs (Metoprolol Tartrate) .... Take One Half Tab Two Times A Day 4)  Lisinopril 10 Mg  Tabs (Lisinopril) .... Take 1 Tablet By Mouth Once A Day 5)  Aspirin 81 Mg  Tbec (Aspirin) .... Take 1 Tablet By Mouth Once A Day 6)  Plavix 75 Mg  Tabs (Clopidogrel Bisulfate) .... Take 1 Tablet By Mouth Once A Day 7)  Klor-Con M20 20 Meq Cr-Tabs (Potassium Chloride Crys Cr) .... Take Two By Mouth Daily 8)  Fish Oil 1000 Mg Caps (Omega-3 Fatty Acids) .... Take 2 Pills Daily 9)  Tizanidine Hcl 4 Mg Tabs (Tizanidine Hcl) .... Take One Tab Every 6 Hours 10)  Hydrocodone-Acetaminophen 5-500 Mg Tabs (Hydrocodone-Acetaminophen) .... Take 1 Tablet By Mouth Three Times A Day  As  Needed 11)  Co Q-10 200 Mg Caps (Coenzyme Q10) .... One Cap By Mouth Qd 12)  Gabapentin 300 Mg Caps (Gabapentin) .... One Cap By Mouth Tid 13)  Senna-Plus 8.6-50 Mg Tabs (Sennosides-Docusate Sodium) .... Three Tabs By Mouth Qd  Allergies (verified): 1)  ! * Niaspan  Review of Systems      See HPI General:  Complains of fatigue and weakness; denies chills, fever, and sweats. Eyes:  Denies blurring and red eye. ENT:  Denies hoarseness, nasal congestion, and sinus pressure. CV:  See HPI. Resp:  Complains of shortness of breath; denies cough and sputum productive. GI:  Denies abdominal pain, constipation, diarrhea, nausea, and vomiting. GU:  Denies dysuria and urinary frequency. MS:  Complains of joint pain, muscle weakness, and stiffness. Psych:  Denies anxiety and depression. Endo:  Denies cold intolerance, excessive hunger, excessive thirst, excessive urination, heat intolerance, polyuria, and weight change.  Physical Exam  General:  alert, well-hydrated, and overweight-appearing.  HEENT: No facial asymmetry,  EOMI, No sinus tenderness, TM's Clear, oropharynx  pink and moist.   Chest: Clear to auscultation bilaterally.  CVS: S1, S2, No murmurs, No S3.   Abd: Soft, Nontender.  MS: decreased ROM spine, hips, shoulders and knees.  Ext: No edema.   CNS: CN  2-12 intactdecreased, power tone and sensation on left upper and lower ext s/p CVA  Skin: Intact, no visible lesions or rashes.  Psych: Good eye contact, normal affect.  Memory intact, not anxious or depressed appearing.     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 Take 1 tablet by mouth once a day    Plavix 75 Mg Tabs (Clopidogrel bisulfate) .Marland Kitchen... 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 Take 1 tablet by mouth once a day    Metoprolol Tartrate 50 Mg Tabs (Metoprolol tartrate) .Marland Kitchen... Take one half tab two times a day    Lisinopril 10 Mg Tabs (Lisinopril) .Marland Kitchen... Take 1 tablet by mouth once a day  BP today: 120/80 Prior BP: 120/80 (06/24/2009)  Labs Reviewed: K+: 4.5 (11/11/2009) Creat: : 0.93 (11/11/2009)   Chol: 231 (11/11/2009)   HDL: 44 (11/11/2009)   LDL: 161 (11/11/2009)   TG: 131 (11/11/2009)  Problem # 5:  HYPERLIPIDEMIA (ICD-272.4) Assessment: Deteriorated  The following medications were removed from the medication list:    Simvastatin 20 Mg Tabs (Simvastatin) .Marland Kitchen... Take 1 tab by mouth at bedtime, pt reprts significant intolerance and will not tak any statin  Labs Reviewed: SGOT: 19 (11/11/2009)   SGPT: 17 (11/11/2009)   HDL:44 (11/11/2009), 43 (07/09/2009)  LDL:161 (11/11/2009), 77 (98/07/9146)  Chol:231 (11/11/2009), 145 (07/09/2009)  Trig:131 (11/11/2009), 124 (07/09/2009)  Problem # 6:  HYPERTENSION (ICD-401.9)  His updated medication list for this problem includes:    Triamterene-hctz 37.5-25 Mg Tabs (Triamterene-hctz) .Marland Kitchen... Take 1 tablet by mouth once a day    Metoprolol Tartrate 50 Mg Tabs (Metoprolol tartrate) .Marland Kitchen... Take one half tab two times a day    Lisinopril 10 Mg Tabs (Lisinopril) .Marland Kitchen... Take 1 tablet by mouth once a day  Complete Medication List: 1)  [facility]vs Spectravite Tabs (Multiple vitamins-minerals) .... Take 1 tablet by mouth once a day 2)  Triamterene-hctz 37.5-25 Mg Tabs (Triamterene-hctz) .... Take 1 tablet by mouth once a day 3)  Metoprolol Tartrate 50 Mg Tabs (Metoprolol tartrate) .... Take one half tab two times a  day 4)  Lisinopril 10 Mg Tabs (Lisinopril) .... Take 1 tablet by mouth once a day 5)  Aspirin 81 Mg Tbec (Aspirin) .... Take 1 tablet by mouth once a day 6)  Plavix 75 Mg Tabs (Clopidogrel bisulfate) .... Take 1 tablet by mouth once a day 7)  Klor-con M20 20 Meq Cr-tabs (Potassium chloride crys cr) .... Take two by mouth daily 8)  Fish Oil 1000 Mg Caps (Omega-3 fatty acids) .... Take 2 pills daily 9)  Tizanidine Hcl 4 Mg Tabs (Tizanidine hcl) .... Take one tab every 6 hours 10)  Hydrocodone-acetaminophen 5-500 Mg Tabs (Hydrocodone-acetaminophen) .... Take 1 tablet by mouth three times a day  as needed 11)  Co Q-10 200 Mg Caps (Coenzyme q10) .... One cap by mouth qd 12)  Gabapentin 300 Mg Caps (Gabapentin) .... One cap by mouth tid 13)  Senna-plus 8.6-50 Mg Tabs (Sennosides-docusate sodium) .... Three  tabs by mouth qd  Patient Instructions: 1)  Please schedule a follow-up appointment in .5 months. 2)  It is important that you exercise regularly at least 20 minutes 5 times a week. If you develop chest pain, have severe difficulty breathing, or feel very tired , stop exercising immediately and seek medical attention. 3)  You need to lose weight. Consider a lower calorie diet and regular exercise.  4)  Your blood pressure is great , no med changes at this time. 5)  pLs discucc the cholesterol with the cardiologist. Prescriptions: METOPROLOL TARTRATE 50 MG  TABS (METOPROLOL TARTRATE) Take ONE HALF tab two times a day  #45 x 3   Entered by:   Everitt Amber LPN   Authorized by:   Syliva Overman MD   Signed by:   Everitt Amber LPN on 85/46/2703   Method used:   [facility]ronically to        [facility] [phone]* (retail)       [address]       [place], [place]  [address]       Ph: [phone] or [phone]       Fax: [phone]   RxID:   [id number]

## 2010-10-13 NOTE — Assessment & Plan Note (Signed)
Summary: office visit   Vital Signs:  Patient profile:   70 year old male Height:      69 inches Weight:      265.25 pounds BMI:     39.31 O2 Sat:      98 % on Room air Pulse rate:   59 / minute Pulse rhythm:   regular Resp:     16 per minute BP sitting:   120 / 70  (right arm)  Vitals Entered By: Mauricia Area CMA (July 24, 2010 10:23 AM)  Nutrition Counseling: Patient's BMI is greater than 25 and therefore counseled on weight management options.  O2 Flow:  Room air CC: Follow up   CC:  Follow up.  History of Present Illness: Reports  that he has been doing fairly well. He unfortunately has not been diligent in dietary change , hence his weight is unchanged, he is a bit more active, since the pain in his legs is less Denies recent fever or chills. Denies sinus pressure, nasal congestion , ear pain or sore throat. Denies chest congestion, or cough productive of sputum. Denies chest pain, palpitations, PND, orthopnea or leg swelling. Denies abdominal pain, nausea, vomitting, diarrhea or constipation. Denies change in bowel movements or bloody stool. Denies dysuria , frequency, incontinence or hesitancy.  Denies headaches, vertigo, seizures. Denies depression, anxiety or insomnia. Denies  rash, lesions, or itch.     Current Medications (verified): 1)  [facility]   Tabs (Multiple Vitamins-Minerals) .... Take 1 Tablet By Mouth Once A Day 2)  Triamterene-Hctz 37.5-25 Mg  Tabs (Triamterene-Hctz) .... Take 1 Tablet By Mouth Once A Day 3)  Metoprolol Tartrate 50 Mg  Tabs (Metoprolol Tartrate) .... Take One Half Tab Two Times A Day 4)  Lisinopril 10 Mg  Tabs (Lisinopril) .... Take 1 Tablet By Mouth Once A Day 5)  Aspirin 81 Mg  Tbec (Aspirin) .... Take 1 Tablet By Mouth Once A Day 6)  Plavix 75 Mg  Tabs (Clopidogrel Bisulfate) .... Take 1 Tablet By Mouth Once A Day 7)  Klor-Con M20 20 Meq Cr-Tabs (Potassium Chloride Crys Cr) .... Take Two By Mouth Daily 8)  Fish Oil  1000 Mg Caps (Omega-3 Fatty Acids) .... Take 2 Pills Daily 9)  Tizanidine Hcl 4 Mg Tabs (Tizanidine Hcl) .... Take One Tab Two Times A Day 10)  Hydrocodone-Acetaminophen 5-500 Mg Tabs (Hydrocodone-Acetaminophen) .... Take 1 Tablet By Mouth Three Times A Day  As Needed 11)  Co Q-10 200 Mg Caps (Coenzyme Q10) .... One Cap By Mouth Qd 12)  Gabapentin 300 Mg Caps (Gabapentin) .... One Cap By Mouth Two Times A Day 13)  Senna-Plus 8.6-50 Mg Tabs (Sennosides-Docusate Sodium) .... Three Tabs By Mouth Once Daily. 14)  Clotrimazole-Betamethasone 1-0.05 % Crea (Clotrimazole-Betamethasone) .... Apply Topically To Affected Area Two Times A Day As Needed 15)  Simvastatin 20 Mg Tabs (Simvastatin) .... Take 1 Tab By Mouth At Bedtime (Take As Able)  Allergies (verified): 1)  ! * Niaspan  Review of Systems      See HPI General:  Complains of fatigue. Eyes:  Complains of vision loss-both eyes; wears corrective lenses. MS:  Complains of joint pain, muscle weakness, and stiffness. Endo:  Denies cold intolerance, excessive hunger, excessive thirst, and excessive urination. Heme:  Denies abnormal bruising and bleeding. Allergy:  Denies hives or rash and itching eyes.  Physical Exam  General:  Well-developed,obese,in no acute distress; alert,appropriate and cooperative throughout examination HEENT: No facial asymmetry,  EOMI, No sinus tenderness,  TM's Clear, oropharynx  pink and moist.   Chest: Clear to auscultation bilaterally.  CVS: S1, S2, No murmurs, No S3.   Abd: Soft, Nontender.  MS: decreased e ROM spine, hips, shoulders and knees.  Ext: No edema.   CNS: CN 2-12 intact, power tone and sensation decreased onleft  upper and lower extremeties.   Skin: Intact, no visible lesions or rashes.  Psych: Good eye contact, normal affect.  Memory intact, not anxious or depressed appearing.    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 Take 1 tablet by mouth once a day    Plavix 75 Mg Tabs (Clopidogrel bisulfate) .Marland Kitchen... Take 1 tablet by mouth once a day  Problem # 2:  OBESITY (ICD-278.00) Assessment: Unchanged  Ht: 69 (07/24/2010)   Wt: 265.25 (07/24/2010)   BMI: 39.31 (07/24/2010) therapeutic lifestyle change discussed and encouraged  Problem # 3:  HYPERTENSION (ICD-401.9) Assessment: Improved  His updated medication list for this problem includes:    Triamterene-hctz 37.5-25 Mg Tabs (Triamterene-hctz) .Marland Kitchen... Take 1 tablet by mouth once a day    Metoprolol Tartrate 50 Mg Tabs (Metoprolol tartrate) .Marland Kitchen... Take one half tab two times a day    Lisinopril 10 Mg Tabs (Lisinopril) .Marland Kitchen... Take 1 tablet by mouth once a day  Orders: Medicare Electronic Prescription [phone]) T-Basic Metabolic Panel [phone])  BP today: 120/70 Prior BP: 134/78 (04/30/2010)  Labs Reviewed: K+: 4.2 (04/27/2010) Creat: : 1.15 (04/27/2010)   Chol: 150 (04/27/2010)   HDL: 39 (04/27/2010)   LDL: 81 (04/27/2010)   TG: 151 (04/27/2010)  Problem # 4:  HYPERLIPIDEMIA (ICD-272.4) Assessment: Comment Only  His updated medication list for this problem includes:    Simvastatin 20 Mg Tabs (Simvastatin) .Marland Kitchen... Take 1 tab by mouth at bedtime (take as able)  Orders: Medicare Electronic Prescription [phone]) T-Hepatic Function [phone]) T-Lipid Profile [phone]) Low fat dietdiscussed and encouraged  Labs Reviewed: SGOT: 25 (04/27/2010)   SGPT: 20 (04/27/2010)   HDL:39 (04/27/2010), 44 (11/11/2009)  LDL:81 (04/27/2010), 161 (28/41/3244)  Chol:150 (04/27/2010), 231 (11/11/2009)  Trig:151 (04/27/2010), 131 (11/11/2009)  Problem # 5:  BACK PAIN (ICD-724.5) Assessment: Improved  His updated medication list for this problem includes:    Aspirin 81 Mg Tbec (Aspirin) .Marland Kitchen... Take 1 tablet by mouth once a day    Tizanidine Hcl 4 Mg Tabs (Tizanidine hcl) .Marland Kitchen...  Take one tab two times a day    Hydrocodone-acetaminophen 5-500 Mg Tabs (Hydrocodone-acetaminophen) .Marland Kitchen... Take 1 tablet by mouth three times a day  as needed  Problem # 6:  ESSENTIAL HYPERTENSION (ICD-401.9) Assessment: Improved  His updated medication list for this problem includes:    Triamterene-hctz 37.5-25 Mg Tabs (Triamterene-hctz) .Marland Kitchen... Take 1 tablet by mouth once a day    Metoprolol Tartrate 50 Mg Tabs (Metoprolol tartrate) .Marland Kitchen... Take one half tab two times a day    Lisinopril 10 Mg Tabs (Lisinopril) .Marland Kitchen... Take 1 tablet by mouth once a day  Orders: Medicare Electronic Prescription [phone]) T-Basic Metabolic Panel [phone])  BP today: 120/70 Prior BP: 134/78 (04/30/2010)  Labs Reviewed: K+: 4.2 (04/27/2010) Creat: : 1.15 (04/27/2010)   Chol: 150 (04/27/2010)   HDL: 39 (04/27/2010)   LDL: 81 (04/27/2010)   TG: 151 (04/27/2010)  Complete Medication List: 1)  [facility] Spect[facility] Tabs (Multiple vitamins-minerals) .... Take 1 tablet by mouth once a day 2)  Triamterene-hctz 37.5-25  Mg Tabs (Triamterene-hctz) .... Take 1 tablet by mouth once a day 3)  Metoprolol Tartrate 50 Mg Tabs (Metoprolol tartrate) .... Take one half tab two times a day 4)  Lisinopril 10 Mg Tabs (Lisinopril) .... Take 1 tablet by mouth once a day 5)  Aspirin 81 Mg Tbec (Aspirin) .... Take 1 tablet by mouth once a day 6)  Plavix 75 Mg Tabs (Clopidogrel bisulfate) .... Take 1 tablet by mouth once a day 7)  Klor-con M20 20 Meq Cr-tabs (Potassium chloride crys cr) .... Take two by mouth daily 8)  Fish Oil 1000 Mg Caps (Omega-3 fatty acids) .... Take 2 pills daily 9)  Tizanidine Hcl 4 Mg Tabs (Tizanidine hcl) .... Take one tab two times a day 10)  Hydrocodone-acetaminophen 5-500 Mg Tabs (Hydrocodone-acetaminophen) .... Take 1 tablet by mouth three times a day  as needed 11)  Co Q-10 200 Mg Caps (Coenzyme q10) .... One cap by mouth qd 12)  Gabapentin 300 Mg Caps (Gabapentin) .... One cap by mouth two times a day 13)   Senna-plus 8.6-50 Mg Tabs (Sennosides-docusate sodium) .... Three tabs by mouth once daily. 14)  Clotrimazole-betamethasone 1-0.05 % Crea (Clotrimazole-betamethasone) .... Apply topically to affected area two times a day as needed 15)  Simvastatin 20 Mg Tabs (Simvastatin) .... Take 1 tab by mouth at bedtime (take as able)  Other Orders: T-TSH ([id number]) T-PSA ([id number])  Patient Instructions: 1)  cPE end April. 2)  PSA today. 3)  Fasting labs 2nd or 3rd week in January, 4)  No med changes. 5)  I am happy that you feel better. 6)  It is important that you exercise regularly at least 30 minutes 5 times a week. If you develop chest pain, have severe difficulty breathing, or feel very tired , stop exercising immediately and seek medical attention. 7)  You need to lose weight. Consider a lower calorie diet and regular exercise. Goal is 2 pounds each month Prescriptions: PLAVIX 75 MG  TABS (CLOPIDOGREL BISULFATE) Take 1 tablet by mouth once a day  #30 Tablet x 3   Entered by:   Adella Hare LPN   Authorized by:   Syliva Overman MD   Signed by:   Adella Hare LPN on 29/56/2130   Method used:   [facility] to        [facility] [phone]* (retail)       [address]       [place], [place]  [address]       Ph: [phone] or [phone]       Fax: [phone]   RxID:   [id number] TIZANIDINE HCL 4 MG TABS (TIZANIDINE HCL) take one tab two times a day  #60 x 3   Entered by:   Adella Hare LPN   Authorized by:   Syliva Overman MD   Signed by:   Adella Hare LPN on 84/16/6063   Method used:   Electronically to        [facility]. [phone]* (retail)       [address]       [place], [place]  [address]       Ph: [phone] or [phone]       Fax: [phone]   RxID:   [id number] KLOR-CON M20 20 MEQ CR-TABS (POTASSIUM CHLORIDE CRYS CR) take two by mouth daily  #60 Tablet x 3   Entered by:  Adella Hare LPN   Authorized by:    Syliva Overman MD   Signed by:   Adella Hare LPN on 16/06/9603   Method used:   Electronically to        [facility]. [phone]* (retail)       [address]       [place], [place]  [address]       Ph: [phone] or [phone]       Fax: [phone]   RxID:   [id number] LISINOPRIL 10 MG  TABS (LISINOPRIL) Take 1 tablet by mouth once a day  #30 Tablet x 3   Entered by:   Adella Hare LPN   Authorized by:   Syliva Overman MD   Signed by:   Adella Hare LPN on 74/25/9563   Method used:   Electronically to        [facility]. [phone]* (retail)       [address]       [place], [place]  [address]       Ph: [phone] or [phone]       Fax: [phone]   RxID:   [id number]    Orders Added: 1)  Est. Patient Level IV [[address]] 2)  Medicare Electronic Prescription [G8553] 3)  T-Basic Metabolic Panel [phone] 4)  T-Hepatic Function [80076-22960] 5)  T-Lipid Profile [80061-22930] 6)  T-TSH [54627-03500] 7)  T-PSA [93818-29937]

## 2010-10-13 NOTE — Letter (Signed)
Summary: Laboratory/X-Ray Results  [facility]  [address]   [place], [place] [address]   Phone: [phone]  Fax: [phone]    Lab/X-Ray Results  July 27, 2010  MRN: [id number]  [facility] [address] [place], [place]  [address]  Dear Mr. Wempe,  The results of your recent lab has been reviewed and were found: NORMAL    If you have any questions, please contact our office.     Adella Hare LPN

## 2010-10-13 NOTE — Letter (Signed)
Summary: RX ASSISTANCE  RX ASSISTANCE   Imported By: Lind Guest 08/03/2010 09:06:45  _____________________________________________________________________  External Attachment:    Type:   Image     Comment:   External Document

## 2010-10-13 NOTE — Assessment & Plan Note (Signed)
Summary: office visit   Vital Signs:  Patient profile:   70 year old male Height:      69 inches Weight:      267 pounds BMI:     39.57 O2 Sat:      94 % Pulse rate:   74 / minute Pulse rhythm:   regular Resp:     16 per minute BP sitting:   126 / 72  (left arm) Cuff size:   large  Vitals Entered By: Everitt Amber LPN (April 21, 2010 8:21 AM)  Nutrition Counseling: Patient's BMI is greater than 25 and therefore counseled on weight management options. Denies change in bowel movements or bloody stool. Denies dysuria , frequency, incontinence or hesitancy. Denies  joint pain, swelling, or reduced mobility. Denies headaches, vertigo, seizures. Denies depression, anxiety or insomnia. Denies  rash, lesions, or itch.   CC: Follow up chronic problems   CC:  Follow up chronic problems.  History of Present Illness: Pt has started therapy last week, he is happy about this and excited, he has come from being wheelchair bound to be able to walk 0.1 mile, he is doing 3 times per week for 4 weeks. He states the pain cotrol in the left knee is now appropriate. Reports  that he has beendoing well. Denies recent fever or chills. Denies sinus pressure, nasal congestion , ear pain or sore throat. Denies chest congestion, or cough productive of sputum. Denies chest pain, palpitations, PND, orthopnea or leg swelling. Denies abdominal pain, nausea, vomitting, diarrhea or constipation. Denies change in bowel movements or bloody stool. Denies dysuria , frequency, incontinence or hesitancy.  Denies headaches, vertigo, seizures. Denies depression, anxiety or insomnia. Denies  rash, lesions, or itch.     Current Medications (verified): 1)  [facility]   Tabs (Multiple Vitamins-Minerals) .... Take 1 Tablet By Mouth Once A Day 2)  Triamterene-Hctz 37.5-25 Mg  Tabs (Triamterene-Hctz) .... Take 1 Tablet By Mouth Once A Day 3)  Metoprolol Tartrate 50 Mg  Tabs (Metoprolol Tartrate) .... Take One  Half Tab Two Times A Day 4)  Lisinopril 10 Mg  Tabs (Lisinopril) .... Take 1 Tablet By Mouth Once A Day 5)  Aspirin 81 Mg  Tbec (Aspirin) .... Take 2 Tablets By Mouth Once A Day 6)  Plavix 75 Mg  Tabs (Clopidogrel Bisulfate) .... Take 1 Tablet By Mouth Once A Day 7)  Klor-Con M20 20 Meq Cr-Tabs (Potassium Chloride Crys Cr) .... Take Two By Mouth Daily 8)  Fish Oil 1000 Mg Caps (Omega-3 Fatty Acids) .... Take 2 Pills Daily 9)  Tizanidine Hcl 4 Mg Tabs (Tizanidine Hcl) .... Take One Tab Every 6 Hours 10)  Hydrocodone-Acetaminophen 5-500 Mg Tabs (Hydrocodone-Acetaminophen) .... Take 1 Tablet By Mouth Three Times A Day  As Needed 11)  Co Q-10 200 Mg Caps (Coenzyme Q10) .... One Cap By Mouth Qd 12)  Gabapentin 300 Mg Caps (Gabapentin) .... One Cap By Mouth Tid 13)  Senna-Plus 8.6-50 Mg Tabs (Sennosides-Docusate Sodium) .... Three Tabs By Mouth Qd 14)  Clotrimazole-Betamethasone 1-0.05 % Crea (Clotrimazole-Betamethasone) .... Apply Topically To Affected Area Two Times A Day As Needed 15)  Simvastatin 20 Mg Tabs (Simvastatin) .... Take 1 Tab By Mouth At Bedtime (Take As Able)  Allergies (verified): 1)  ! * Niaspan  Review of Systems      See HPI General:  Complains of fatigue. Eyes:  Complains of vision loss-both eyes; wears corrective lenses. MS:  Complains of joint pain, low back  pain, mid back pain, muscle weakness, and stiffness; left hemipARESIS S/P CVA. Psych:  Complains of anxiety; denies depression, suicidal thoughts/plans, thoughts of violence, and unusual visions or sounds. Endo:  Denies excessive thirst and heat intolerance. Heme:  Denies abnormal bruising and enlarge lymph nodes. Allergy:  Denies hives or rash and persistent infections.  Physical Exam  General:  alert, well-hydrated, and overweight-appearing.  HEENT: No facial asymmetry,  EOMI, No sinus tenderness, TM's Clear, oropharynx  pink and moist.   Chest: Clear to auscultation bilaterally.  CVS: S1, S2, No murmurs, No  S3.   Abd: Soft, Nontender.  MS: decreased ROM spine, hips, shoulders and knees.  Ext: No edema.   CNS: CN 2-12 intactdecreased, power tone and sensation on left upper and lower ext s/p CVA  Skin: Intact, no visible lesions or rashes.  Psych: Good eye contact, normal affect.  Memory intact, not anxious or depressed appearing.     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 Take 2 tablets by mouth once a day    Plavix 75 Mg Tabs (Clopidogrel bisulfate) .Marland Kitchen... Take 1 tablet by mouth once a day Strength slightly improvedwith therapy  Problem # 3:  CAD (ICD-414.00) Assessment: Comment Only  His updated medication list for this problem includes:    Triamterene-hctz 37.5-25 Mg Tabs (Triamterene-hctz) .Marland Kitchen... Take 1 tablet by mouth once a day    Metoprolol Tartrate 50 Mg Tabs (Metoprolol tartrate) .Marland Kitchen... Take one half tab two times a day    Lisinopril 10 Mg Tabs (Lisinopril) .Marland Kitchen... Take 1 tablet by mouth once a day    Aspirin 81 Mg Tbec (Aspirin) .Marland Kitchen... Take 2 tablets by mouth once a day    Plavix 75 Mg Tabs (Clopidogrel bisulfate) .Marland Kitchen... Take 1 tablet by mouth once a day  Labs Reviewed: Chol: 231 (11/11/2009)   HDL: 44 (11/11/2009)   LDL: 161 (11/11/2009)   TG: 131 (11/11/2009)  Problem # 4:  HYPERTENSION (ICD-401.9) Assessment: Unchanged  His updated medication list for this problem includes:    Triamterene-hctz 37.5-25 Mg Tabs (Triamterene-hctz) .Marland Kitchen... Take 1 tablet by mouth once a day    Metoprolol Tartrate 50 Mg Tabs (Metoprolol tartrate) .Marland Kitchen... Take one half tab two times a day    Lisinopril 10 Mg Tabs (Lisinopril) .Marland Kitchen... Take 1  tablet by mouth once a day  Orders: T-Basic Metabolic Panel [id number] [phone])  BP today: 126/72 Prior BP: 120/80 (11/19/2009)  Labs Reviewed: K+: 4.5 (11/11/2009) Creat: : 0.93 (11/11/2009)   Chol: 231 (11/11/2009)   HDL: 44 (11/11/2009)   LDL: 161 (11/11/2009)   TG: 131 (11/11/2009)  Complete Medication List: 1)  [facility] Spect[facility] Tabs (Multiple vitamins-minerals) .... Take 1 tablet by mouth once a day 2)  Triamterene-hctz 37.5-25 Mg Tabs (Triamterene-hctz) .... Take 1 tablet by mouth once a day 3)  Metoprolol Tartrate 50 Mg Tabs (Metoprolol tartrate) .... Take one half tab two times a day 4)  Lisinopril 10 Mg Tabs (Lisinopril) .... Take 1 tablet by mouth once a day 5)  Aspirin 81 Mg Tbec (Aspirin) .... Take 2 tablets by mouth once a day 6)  Plavix 75 Mg Tabs (Clopidogrel bisulfate) .... Take 1 tablet by  mouth once a day 7)  Klor-con M20 20 Meq Cr-tabs (Potassium chloride crys cr) .... Take two by mouth daily 8)  Fish Oil 1000 Mg Caps (Omega-3 fatty acids) .... Take 2 pills daily 9)  Tizanidine Hcl 4 Mg Tabs (Tizanidine hcl) .... Take one tab every 6 hours 10)  Hydrocodone-acetaminophen 5-500 Mg Tabs (Hydrocodone-acetaminophen) .... Take 1 tablet by mouth three times a day  as needed 11)  Co Q-10 200 Mg Caps (Coenzyme q10) .... One cap by mouth qd 12)  Gabapentin 300 Mg Caps (Gabapentin) .... One cap by mouth tid 13)  Senna-plus 8.6-50 Mg Tabs (Sennosides-docusate sodium) .... Three tabs by mouth qd 14)  Clotrimazole-betamethasone 1-0.05 % Crea (Clotrimazole-betamethasone) .... Apply topically to affected area two times a day as needed 15)  Simvastatin 20 Mg Tabs (Simvastatin) .... Take 1 tab by mouth at bedtime (take as able)  Other Orders: T-Hepatic Function [phone]) T-Lipid Profile ([id number])  Patient Instructions: 1)  Please schedule a follow-up appointment in 4 months. 2)  It is important that you exercise regularly at least 20 minutes 5 times a week. If you develop chest  pain, have severe difficulty breathing, or feel very tired , stop exercising immediately and seek medical attention. 3)  You need to lose weight. Consider a lower calorie diet and regular exercise.  4)  BMP prior to visit, ICD-9: 5)  Hepatic Panel prior to visit, ICD-9: 6)  Lipid Panel prior to visit, ICD-9:   fasting asap 7)  HbgA1C prior to visit, ICD-9: 8)  No med changes, keep well wiht the therapy. Prescriptions: METOPROLOL TARTRATE 50 MG  TABS (METOPROLOL TARTRATE) Take ONE HALF tab two times a day  #30 Tablet x 4   Entered by:   Everitt Amber LPN   Authorized by:   Syliva Overman MD   Signed by:   Everitt Amber LPN on 08/65/7846   Method used:   Electronically to        [facility] [phone]* (retail)       [address]       [place], [place]  [address]       Ph: [phone] or [phone]       Fax: [phone]   RxID:   [id number] PLAVIX 75 MG  TABS (CLOPIDOGREL BISULFATE) Take 1 tablet by mouth once a day  #30 Tablet x 4   Entered by:   Everitt Amber LPN   Authorized by:   Syliva Overman MD   Signed by:   Everitt Amber LPN on 30/16/0109   Method used:   Electronically to        [facility]. [phone]* (retail)       [address]       [place], [place]  [address]       Ph: [phone] or [phone]       Fax: [phone]   RxID:   [id number] KLOR-CON M20 20 MEQ CR-TABS (POTASSIUM CHLORIDE CRYS CR) take two by mouth daily  #60 Tablet x 4   Entered by:   Everitt Amber LPN   Authorized by:   Syliva Overman MD   Signed by:   Everitt Amber LPN on 81/82/9937   Method used:   Electronically to        [facility]  [facility]. [phone]* (retail)       [address]  [place], [place]  [address]       Ph: [phone] or [phone]       Fax: [phone]   RxID:   [id number] LISINOPRIL 10 MG  TABS (LISINOPRIL) Take 1 tablet by mouth once a day  #30 Tablet x 4   Entered by:   Everitt Amber LPN   Authorized by:   Syliva Overman MD   Signed by:   Everitt Amber LPN on 64/40/3474   Method used:   Electronically to        [facility]. [phone]* (retail)       [address]       [place], [place]  [address]       Ph: [phone] or [phone]       Fax: [phone]   RxID:   [id number]

## 2010-10-14 LAB — CONVERTED CEMR LAB
AST: 19 units/L (ref 0–37)
Albumin: 4.2 g/dL (ref 3.5–5.2)
Bilirubin, Direct: 0.1 mg/dL (ref 0.0–0.3)
CO2: 31 meq/L (ref 19–32)
Calcium: 9.4 mg/dL (ref 8.4–10.5)
Chloride: 102 meq/L (ref 96–112)
Glucose, Bld: 83 mg/dL (ref 70–99)
HDL: 46 mg/dL (ref >39)
Potassium: 3.9 meq/L (ref 3.5–5.3)
Sodium: 141 meq/L (ref 135–145)
TSH: 3.304 microintl units/mL (ref 0.350–4.500)
Total Bilirubin: 0.6 mg/dL (ref 0.3–1.2)
Total CHOL/HDL Ratio: 3.5
VLDL: 25 mg/dL (ref 0–40)

## 2010-11-16 ENCOUNTER — Ambulatory Visit: Payer: Medicare Other | Admitting: Physical Medicine & Rehabilitation

## 2010-11-16 ENCOUNTER — Encounter: Payer: Medicare Other | Attending: Physical Medicine & Rehabilitation

## 2010-11-16 DIAGNOSIS — I10 Essential (primary) hypertension: Secondary | ICD-10-CM | POA: Insufficient documentation

## 2010-11-16 DIAGNOSIS — I69959 Hemiplegia and hemiparesis following unspecified cerebrovascular disease affecting unspecified side: Secondary | ICD-10-CM | POA: Insufficient documentation

## 2010-11-16 DIAGNOSIS — I2510 Atherosclerotic heart disease of native coronary artery without angina pectoris: Secondary | ICD-10-CM

## 2010-11-16 DIAGNOSIS — G8110 Spastic hemiplegia affecting unspecified side: Secondary | ICD-10-CM

## 2010-11-16 DIAGNOSIS — I6330 Cerebral infarction due to thrombosis of unspecified cerebral artery: Secondary | ICD-10-CM

## 2011-01-04 ENCOUNTER — Encounter: Payer: Self-pay | Admitting: Family Medicine

## 2011-01-05 ENCOUNTER — Encounter: Payer: Self-pay | Admitting: Family Medicine

## 2011-01-17 ENCOUNTER — Other Ambulatory Visit: Payer: Self-pay | Admitting: Family Medicine

## 2011-01-20 ENCOUNTER — Other Ambulatory Visit: Payer: Self-pay

## 2011-01-20 MED ORDER — HYDROCODONE-ACETAMINOPHEN 5-500 MG PO TABS: 1.0000 | ORAL_TABLET | Freq: Three times a day (TID) | ORAL | Status: DC | PRN

## 2011-01-26 ENCOUNTER — Emergency Department (HOSPITAL_COMMUNITY): Payer: Medicare Other

## 2011-01-26 ENCOUNTER — Emergency Department (HOSPITAL_COMMUNITY)
Admission: EM | Admit: 2011-01-26 | Discharge: 2011-01-27 | Disposition: A | Discharge status: Home / Self Care | Payer: Medicare Other | Attending: Emergency Medicine | Admitting: Emergency Medicine

## 2011-01-26 DIAGNOSIS — Z79899 Other long term (current) drug therapy: Secondary | ICD-10-CM | POA: Insufficient documentation

## 2011-01-26 DIAGNOSIS — Z794 Long term (current) use of insulin: Secondary | ICD-10-CM | POA: Insufficient documentation

## 2011-01-26 DIAGNOSIS — Z85038 Personal history of other malignant neoplasm of large intestine: Secondary | ICD-10-CM | POA: Insufficient documentation

## 2011-01-26 DIAGNOSIS — R1013 Epigastric pain: Secondary | ICD-10-CM | POA: Insufficient documentation

## 2011-01-26 DIAGNOSIS — R079 Chest pain, unspecified: Secondary | ICD-10-CM | POA: Insufficient documentation

## 2011-01-26 DIAGNOSIS — Z951 Presence of aortocoronary bypass graft: Secondary | ICD-10-CM | POA: Insufficient documentation

## 2011-01-26 DIAGNOSIS — E119 Type 2 diabetes mellitus without complications: Secondary | ICD-10-CM | POA: Insufficient documentation

## 2011-01-26 DIAGNOSIS — Z8673 Personal history of transient ischemic attack (TIA), and cerebral infarction without residual deficits: Secondary | ICD-10-CM | POA: Insufficient documentation

## 2011-01-26 DIAGNOSIS — Z7902 Long term (current) use of antithrombotics/antiplatelets: Secondary | ICD-10-CM | POA: Insufficient documentation

## 2011-01-26 DIAGNOSIS — I10 Essential (primary) hypertension: Secondary | ICD-10-CM | POA: Insufficient documentation

## 2011-01-26 LAB — POCT CARDIAC MARKERS
CKMB, poc: 2.7 ng/mL (ref 1.0–8.0)
Myoglobin, poc: 98.5 ng/mL (ref 12–200)
Troponin i, poc: <0.05 ng/mL (ref 0.00–0.09)

## 2011-01-26 LAB — DIFFERENTIAL
Basophils Absolute: 0 10*3/uL (ref 0.0–0.1)
Basophils Relative: 0 % (ref 0–1)
Eosinophils Absolute: 0.2 10*3/uL (ref 0.0–0.7)
Eosinophils Relative: 3 % (ref 0–5)
Lymphocytes Relative: 29 % (ref 12–46)
Monocytes Absolute: 0.5 10*3/uL (ref 0.1–1.0)

## 2011-01-26 LAB — CBC
HCT: 43.3 % (ref 39.0–52.0)
MCHC: 33.3 g/dL (ref 30.0–36.0)
Platelets: 155 10*3/uL (ref 150–400)
RDW: 13.4 % (ref 11.5–15.5)
WBC: 6.2 10*3/uL (ref 4.0–10.5)

## 2011-01-26 NOTE — Assessment & Plan Note (Signed)
HISTORY OF PRESENT ILLNESS:  Mr. Farner is back regarding his left sided  hemiparesis.  We tried increasing his Zanaflex last visit and all he  really feels is that it has made him more tired.  He is on 6 mg q.i.d.  He has had some ongoing problems with his left foot and calluses on the  toes.  He denies any specific pain.  Sleep is fair.   REVIEW OF SYSTEMS:  Notable for tingling, numbness, bladder control,  occasional bleeding problems, swelling, constipation.  Full review is in  the written health and history section.   SOCIAL HISTORY:  The patient is married and living with his wife.   PHYSICAL EXAMINATION:  VITAL SIGNS:  Blood pressure 112/74, pulse 68,  respiratory rate 18, saturating 94% on room air.  GENERAL APPEARANCE:  The patient is pleasant, alert and oriented x3.  Affect is generally bright and appropriate.  MUSCULOSKELETAL:  Tone is 3/4 in the left upper extremity and 1 to 2/4  in the left lower extremity.  Strength is 1/5 in the upper limb and 2+  to 3/5 still in the left lower limb.  He has trace edema in the leg and  the arm.  CARDIOVASCULAR:  Heart is regular.  CHEST:  Clear.  ABDOMEN:  Soft, nontender.  He remains overweight.   ASSESSMENT:  1. 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  2. Hypertension.  3. History of constipation.  4. Coronary artery disease.   PLAN:  1. Taper Zanaflex to off over the next 7 to 10 days time.  2. Continue Plavix.  3. Continue modifications and follow up with podiatry regarding      calluses.  I would advise him to stay away from surgery if      possible.  4. I will see him back on a p.r.n. basis.  I have nothing new to offer      at this point.      Ranelle Oyster, M.D.  Electronically Signed     ZTS/MedQ  D:  08/14/2007 11:35:40  T:  08/14/2007 11:46:31  Job #:  [id number]   cc:   Milus Mallick. Lodema Hong, M.D.  Fax: [phone]

## 2011-01-26 NOTE — Assessment & Plan Note (Signed)
Craig Fields is back regarding his left leg pain.  He has had his ups and  downs.  The Neurontin may have helped a bit with his symptoms and  Zanaflex seems to help with his spasms, but he is having a lot of pain  in the knee, hip, and ankle now.  He uses Vicodin from his family doctor  with some benefit.  He rates his pain 6-7/10.  He described it as sharp,  constant, aching.  Pain interferes with general activity, relations with  others, enjoyment of life on a moderate level.  Sleep is poor with pain  in his hip often waking him up at nighttime.   REVIEW OF SYSTEMS:  Notable for weakness, trouble walking, spasms,  fever, chills, night sweats, constipation, swelling, sleep apnea.  Other  pertinent positives are above and full review is in the written health  and history section of the chart.   SOCIAL HISTORY:  The patient is married living with his wife.   PHYSICAL EXAMINATION:  Blood pressure is 140/72, pulse 71, respiratory  rate 18.  He is sating 96% on room air.  [name] affect is bright  and appropriate.  He remains overweight.  He continues to walk with  substantial external rotation from the hip down to the leg.  There seems  to be significant valgus stress on the left knee in stance phase.  Tone  is still 2/4 in the left leg with 3+ reflexes.  He has edema in the left  leg, it is trace, and trace to 1+ in left arm.  Heart is regular.  Chest  is clear.  Abdomen is soft, nontender.   ASSESSMENT:  1. Right cerebrovascular accident, spastic left hemiparesis.  2. Hypertension.  3. Constipation.  4. Multifactorial left leg pain which is related to spasticity,      perhaps neuropathic pain and certainly there are arthritic changes      in the hip, knee, and ankle.   PLAN:  1. Recommended increasing Zanaflex at night to 8 mg and holding off on      early morning dosing.  I would like to see him get some sleep.  2. He needs to follow up on a sleep apnea and CPAP machine to find  an      appropriate setting.  3. Consider Klonopin at night for spasticity and nighttime symptoms.  4. Voltaren gel trial t.i.d. to left knee, hip, and ankle.  He is not      a candidate for oral anti-inflammatories.  5. May benefit from specific trial of therapy to help straighten out      his left leg in stance.  Perhaps pool therapy would be beneficial.  6. I will see him back in about 2 months' time.      Ranelle Oyster, M.D.  Electronically Signed     ZTS/MedQ  D:  04/08/2009 11:04:47  T:  04/09/2009 60:45:40  Job #:  [id number]   cc:   Teola Bradley, M.D.  Fax: [phone]   Milus Mallick. Lodema Hong, M.D.  Fax: [phone]

## 2011-01-26 NOTE — Assessment & Plan Note (Signed)
Craig Fields is back regarding his left leg pain.  He seemed to be doing a bit  better for a while particularly when I saw him last May.  He states that  over the last few weeks, pain seems to have worsened.  Pain seems to be  worse when he is walking and when he is resting.  He knows the pain is  increased at night when he first awakes in the morning.  He described as  a sharp, sometimes burning, and aching at the hip, knee, and ankle.  He  is on the increased Zanaflex still at this point.  He has had a back off  therapy at this point.  He is able to walk short distances with his  straight cane.   REVIEW OF SYSTEMS:  Notable for the above as well as tremor, weakness,  spasms, nausea, urine retention, night sweats, sleep apnea, limb  swelling, fever, chills.  Other pertinent positives are above and full  review is in the written health and history section of the chart.   SOCIAL HISTORY:  The patient is married, living with his wife.  He has  also been in therapy recently.   PHYSICAL EXAMINATION:  VITAL SIGNS:  Blood pressure is 153/71, pulse is  57, respiratory rate 18.  He is sating 95% on room air.  GENERAL:  The patient is pleasant, alert, and oriented x3.  Affect is  generally bright and appropriate.  EXTREMITIES:  He walks with externally rotated leg and significant  valgus stress on the left knee.  His tone still in left upper extremity  is 2/4.  Reflexes 3+.  He has trace edema in left leg, 1+ left arm  today.  Left leg appears to be slightly hyperemic compared to the right,  although pulses are 2+ dorsalis pedis and posterior tibial areas.  HEART:  Regular.  CHEST:  Clear.  ABDOMEN:  Soft, nontender.  Weight is unchanged.  [name] straight  leg testing was equivocal.  He had some pain with palpation along the  left knee and ankle.   ASSESSMENT:  1. Right cerebrovascular accident, spastic left hemiparesis.  2. Hypertension.  3. Constipation.  4. Continued left leg pain.  I  felt that this was initially related to      the patient's statin agents.  However, he has had some persistent      pain and has been in joint areas as well as diffusely down the leg.      I wonder if he is having more of a osteoarthritic versus      neuropathic presentation now.  He denies back pain at this point,      but this also could be radiculopathy.   PLAN:  1. We will begin the patient on Neurontin titrating up to 300 mg      t.i.d. to see if this improves his pain.  He had been on this in      the remote past and tolerated it previously.  2. We will continue with Zanaflex current dosing 4 mg q.6 h.  3. Consider Klonopin h.s. for morning spasticity and nighttime      symptoms.  4. Hold on any physical therapy at this point.  5. I will see him back in about a month.      Ranelle Oyster, M.D.  Electronically Signed     ZTS/MedQ  D:  03/10/2009 09:52:07  T:  03/11/2009 01:01:55  Job #:  [id number]  cc:   J. Darreld Mclean, M.D.  Fax: [phone]   Milus Mallick. Lodema Hong, M.D.  Fax: [phone]

## 2011-01-26 NOTE — Assessment & Plan Note (Signed)
Craig Fields is back regarding his leg pain.  He has done better with the  prednisone taper.  We gave him two of these.  The second one was not  effective, but overall he has much improved.  He rates his pain 4/10,  described it as sharp, tingling, and aching.  He had some ongoing tone,  although Zanaflex seems to have helped him from a functional standpoint  without any substantial side effects.  Sleep is fair.  Pain worsens with  walking, sitting, and prolonged standing; and improves with rest and  medications.   REVIEW OF SYSTEMS:  Notable for numbness, tingling, trouble walking,  spasms, weakness.  Also he complaints of fever, chills, easy bleeding,  constipation, limb swelling, sleep apnea.  Other pertinent positives  above.  Full review is in the written health and history section of the  chart.   SOCIAL HISTORY:  The patient is married, living with his wife.   PHYSICAL EXAMINATION:  VITAL SIGNS:  Blood pressure 154/74, pulse 65,  respiratory rate 18, and he is saturating 95% on room air.  GENERAL:  The patient is pleasant, alert, and oriented x3.  MUSCULOSKELETAL:  Tone remains 2-3/4 in the left leg and arm.  Strength  in left leg is still in the 2+ to 3- range.  Strength in the upper  extremity is 1/5.  He seemed to be walking much better with less  antalgia and more fluidity.  His trace edema in left leg was 2+, pulses  intact and intact skin.  HEART:  Regular.  CHEST:  Clear.  ABDOMEN:  Soft and nontender.  Weight unchanged.   ASSESSMENT:  1. 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  2. Hypertension.  3. Constipation.  4. 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  May      be clouded by spasticity and perhaps neuropathic pain in the left      lower extremity as well.   PLAN:  1. We will send the patient for outpatient physical therapy and      further improve his strength and endurance as well as range of      motion.  He was very  receptive to this.  2. We will increase Zanaflex for better spasticity control going to 4      mg q.8 h. for 1 week then q.6 h. thereafter.  3. Continue other medications per his primary team.  He is on Crestor      and then perhaps another triglyceride-      lowering agent as well.  4. I will see him back in 2 months.      Ranelle Oyster, M.D.  Electronically Signed     ZTS/MedQ  D:  01/13/2009 10:17:51  T:  01/14/2009 00:27:51  Job #:  [id number]   cc:   Teola Bradley, M.D.  Fax: [phone]   Milus Mallick. Lodema Hong, M.D.  Fax: [phone]

## 2011-01-26 NOTE — Assessment & Plan Note (Signed)
[name] is back regarding pain complaints of left leg.   He states that at Christmas time, he was started on simvastatin and  Niaspan for his cholesterol and lipids.  He began developed left leg  pain at higher doses.  His medication was stopped and pain improved and  essentially resolved and he was placed back on medication again and pain  is recurred.  He has been off it now for about 2 months or so.  He  continues to have similar pain in his right thigh and calf with  associated spasm.  It sounds as if he has had normal Dopplers and x-rays  of his leg.  He rates his pain on average of 3-10/10 depending on  activity levels.  His pain seems to bother him mostly the daytime when  he has to walk.  He takes Zanaflex 2 at nighttime as well as hydrocodone  for breakthrough pain.  Pain described as sharp, tingling, and  intermittent.  Pain interferes with general activity, relations with  others, and enjoyment of life from a mild-to-severe level.   REVIEW OF SYSTEMS:  Notable for the above.  He reports trouble walking  and easy bleeding.  He has sleep apnea episodes.  It sounds as if he had  normal echocardiogram.   SOCIAL HISTORY:  The patient is married and no changes noted there.   PHYSICAL EXAMINATION:  VITAL SIGNS:  Blood pressure is 106/67, pulse 79,  respiratory rate 18, and saturating 96% on room air.  EXTREMITIES:  The patient walks with his left AFO and tends to  continually rotate the leg externally to help clear the foot.  He has  significant varus moment at the knee as well.  Tone in the knee and foot  remains at 2-3/4.  Reflexes are 3+.  He has underlying 2-3/5 strength in  the left leg still.  Strength in the upper limb remains 1/5 with 2-3/4  tone.  He has trace edema in the left leg and arm still.  HEART:  Regular.  CHEST:  Clear.  ABDOMEN:  Soft and nontender.  Weight is stable.  NEUROLOGIC:  Cognitively, he is intact.   ASSESSMENT:  1. Right cerebrovascular  accident and spastic left hemiparesis.  2. Hypertension.  3. History of constipation.  4. New left leg pain, which may have been as a result of his statin      use.  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   PLAN:  1. We wrote a 16-day prednisone taper starting at 60 mg and tapering      off.  I asked him to closely watch his sugars and blood pressure      especially initially.  He is technically not diabetic at this      point.  2. We will increase Zanaflex to 4 mg h.s. and 2 mg daily in the      morning and midday.  3. Continue other medications per primary team.  4. Recommended stretching, heat, modalities such as warm water soak,      etc.  5. I will see him back about 6 weeks' time.      Ranelle Oyster, M.D.  Electronically Signed     ZTS/MedQ  D:  11/27/2008 10:05:47  T:  11/27/2008 22:13:44  Job #:  [id number]   cc:   Teola Bradley, M.D.  Fax: [phone]   Milus Mallick. Lodema Hong, M.D.  Fax: [phone]

## 2011-01-26 NOTE — Assessment & Plan Note (Signed)
REASON FOR VISIT:  The patient is back complaining of left-sided spastic  hemiparesis.   HISTORY OF PRESENT ILLNESS:  He feels that the Zanaflex has helped him  somewhat, particularly in his left foot where before his toes tended to  plantar flex. He does not know that it has helped his left upper  extremity as much. He is having some sedation with it, but it certainly  is tolerable. We are currently at 4 mg q.i.d. with the Zanaflex. He  remains on Plavix for stroke prophylaxis.   REVIEW OF SYSTEMS:  Notable for trouble walking, easy bleeding. He has  had some changes made to his shoe and he tends to rotate and invert his  foot in his new shoe setup.   SOCIAL HISTORY:  The patient is married, living with his wife.   PHYSICAL EXAMINATION:  VITAL SIGNS: Blood pressure is 119/94, pulse is  58, respiratory rate 20, satting 98% on room air.  GENERAL APPEARANCE: The patient is pleasant, alert and oriented x3,  affect is bright and appropriate.  EXTREMITIES: Tone remains about 3/4 in the left upper extremity. He is a  bit less in the left lower extremity. Strength is 1/5 in the upper, 2+  to 3/5 in the lower.  SKIN: The skin is intact. Trace edema seen in the left arm and leg  still.  HEART: The heart is regular but slightly bradycardic.  CHEST: The chest is clear.  ABDOMEN: The abdomen is soft, nontender. Weight is unchanged.   ASSESSMENT:  1. 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  2. Hypertension.  3. History of constipation.  4. Coronary artery disease.   PLAN:  1. Continue Zanaflex but increase 1 more time up to 6 mg q.i.d. over      the next week or so.  2. Continue Plavix for stroke prophylaxis.  3. Will send to [employer] for shoe modifications including potential      lateral buildup and/or T-strap.  4. I will see him back in about 3 months' time for followup. I would      check LFTs at that point.      Ranelle Oyster, M.D.  Electronically  Signed     ZTS/MedQ  D:  05/09/2007 10:38:43  T:  05/09/2007 17:03:22  Job #:  [id number]

## 2011-01-26 NOTE — Assessment & Plan Note (Signed)
Craig Fields is back regarding his left-sided spastic hemiparesis. We started  the Zanaflex at last visit and he has noted some improvement  particularly in tightness in the left chest and occasionally elbow. Hand  is not significantly different. He has not noticed any specific fatigue  or other side effects related to it. He is occasionally short of breath  but he thinks that is his weight. His activities remain stable. He does  try to walk on a daily basis. Patient denies frank pain today. He is on  Plavix still for stroke prophylaxis.   REVIEW OF SYSTEMS:  Notable for numbness, tingling, weakness, limb  swelling particularly in the effect side. Easy bleeding is also seen.  Full review is in the health and history section.   SOCIAL HISTORY:  Patient is married living with his wife.   RECENT MEDICAL HISTORY:  Patient saw Dr. Hilda Lias up in [place] for  right shoulder pain. It sounds as if he has had some bursitis,  arthritis, and rotator cuff injury. He has had some physical therapy  which has helped.   PHYSICAL EXAMINATION:  Blood pressure is 127/69, pulse is 43,  respiratory rate 18, he is sating at 93% on room air. Patient is  pleasant, alert and oriented x3. Affect is bright and appropriate. Left  arm remains tight with 3 out of 4 tone at the wrist, 1 + to 2 out of 4  at the elbow and shoulder still. Strength is trace to 1 out of 5. Lower  extremity motor function is 2 + to 3 out of 5. He is a bit weaker  distally at the ankle.  SKIN: Intact.  He does have some edema at the left arm and leg at 1 + to 2 +.  Pulse is bradycardic.  CHEST: CLEAR. He remains overweight.   ASSESSMENT:  1. Right CVA with a spastic left hemiparesis.  2. Hypertension.  3. History of constipation.  4. History of coronary artery disease.   PLAN:  1. Continue titrating Zanaflex up eventually to 4 mg q.i.d. His family      physician will send me copies of LFTs when he has these done in a      couple of  weeks.  2. Continue Plavix for stroke prophylaxis.  3. Continue ambulation.  4. I will see the patient back in about 2 months time. He is to call      me with any questions.      Ranelle Oyster, M.D.  Electronically Signed     ZTS/MedQ  D:  03/14/2007 10:30:35  T:  03/14/2007 13:42:36  Job #:  [id number]

## 2011-01-27 LAB — BASIC METABOLIC PANEL
Calcium: 10.4 mg/dL (ref 8.4–10.5)
GFR calc non Af Amer: >60 mL/min (ref >60)
Glucose, Bld: 110 mg/dL — ABNORMAL HIGH (ref 70–99)
Potassium: 3.6 mEq/L (ref 3.5–5.1)
Sodium: 136 mEq/L (ref 135–145)

## 2011-01-29 NOTE — Procedures (Signed)
NAMEJARQUEZ, MESTRE NO.:  [id number]   MEDICAL RECORD NO.:  [id number]          PATIENT TYPE:  REC   LOCATION:  [facility]                          FACILITY:  [facility]   PHYSICIAN:  Ranelle Oyster, M.D.DATE OF BIRTH:  August 24, 1941   DATE OF PROCEDURE:  07/27/2004  DATE OF DISCHARGE:                                 OPERATIVE REPORT   MEDICAL RECORD NUMBER:  [id number].   DATE OF BIRTH:  [birth year].   PROCEDURE IN DETAIL:  Botox injection.   DIAGNOSIS:  Spastic hemiplegia, nondominant side.  ICD9 code 342.12.   After informed consent and appropriate preparation, we injected two muscle  groups today, the left biceps and the left wrist and finger flexor bed.  We  injected these areas using a 25 gauge 1-1/2 inch needle with Botox diluted  in 1 mL of preservative-free normal saline.  The patient tolerated the  injections well.  Aspiration technique was used.  We placed 100 units into  the biceps muscle and 200 units into the wrist and finger flexor bed.   Post injection instructions were given.  I will see the patient back in six  weeks for followup.  May consider phenol injection to the left median nerve  to control severe spasticity at the wrist if Botox fails.   The patient also will follow up with orthotics regarding his left AFO  adjustments.       ZTS/MEDQ  D:  07/27/2004 12:24:45  T:  07/27/2004 13:10:44  Job:  [id number]   cc:   Wendi Maya, M.D.

## 2011-01-29 NOTE — Assessment & Plan Note (Signed)
DATE OF VISIT:  April 05, 2005.   Craig Fields is here regarding his left spastic hemiparesis.  There has really been  no change since our last meeting in December.  He continues to have  significant tone at the wrist, fingers, elbow and shoulder.  We discussed  Phenol injections at last visit, although patient was not eager to go ahead  with these.  He does not wear his tone is too great.  He is able to use his  arm for assistance in some ADLs.  He denies pain.  His left little toes were  giving him some problems due to the fact that they were curling  underneath.  He had a pad placed by his podiatrist which has helped.  He  has also had a corn removed.  He uses his quad cane to walk.  He remains  very active.  His weight has been about stable.   REVIEW OF SYSTEMS:  Patient any new neurological, psychiatric,  constitutional, GU or GI, cardiorespiratory problems.   SOCIAL HISTORY:  Patient had no new changes.   PHYSICAL EXAMINATION:  GENERAL APPEARANCE:  Patient is pleasant, in no acute  distress.  Weight stable.  VITAL SIGNS:  Blood pressure is 112/72, pulse 56, respiratory rate 16,  sating 98% on room air.  LUNGS:  Clear.  CARDIOVASCULAR:  Regular rate and rhythm.  ABDOMEN:  Soft and nontender.  NEUROLOGIC:  Patient is alert and oriented x3.  Left upper extremity has 3/4  tone at the wrist, elbow and shoulder.  Sensation appears near normal.  Reflexes are hyperactive.  Left lower extremity is 3+ to 4/5 proximally and  1+ to 2/5 distally.  Tone is trace to 1/4 distally in the leg.  Cognitively,  the patient is appropriate.  Cranial nerve exam is intact.  Patient  ambulates with his total upright AFO today without significant difficulties.  I examined his toes today and there were no obvious pressure areas.  He  seems to have some contractures of the toes which have lead to some of the  curling.   ASSESSMENT:  1.  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  2.   Hypertension.  3.  Constipation.   PLAN:  1.  We once again discussed Phenol today.  He will think this over.  He will      call me back if he wishes to proceed.  He is not too unhappy with how      things are at this point but would like to see his tone improve.  We      discussed the Baclofen pump as well.  He may be a candidate for straight      induced therapies if some of his tone breaks.  2.  Continue with his adaptive footwear.  3.  I gave the patient samples of Plavix today.  4.  I will see him back in about six months' time.       ZTS/MedQ  D:  04/05/2005 12:59:41  T:  04/05/2005 14:17:17  Job #:  [id number]

## 2011-01-29 NOTE — Cardiovascular Report (Signed)
NAMELONG, BRIMAGE NO.:  [id number]   MEDICAL RECORD NO.:  [id number]                   PATIENT TYPE:  INP   LOCATION:  3714                                 FACILITY:  [facility]   PHYSICIAN:  Cristy Hilts. Jacinto Halim, M.D.                  DATE OF BIRTH:  03-07-1941   DATE OF PROCEDURE:  DATE OF DISCHARGE:                              CARDIAC CATHETERIZATION   REFERRING PHYSICIAN:  Blanch Media, M.D.   PROCEDURE PERFORMED:  1. Left ventriculography.  2. Selective left and right coronary arteriography.  3. Right innominate and left subclavian arteriography.  4. Abdominal aortogram.   INDICATIONS:  Mr. Vencent Lemmerman is a 70 year old Caucasian male with history  of hypertension, morbid obesity, strong family history of premature coronary  artery disease who was admitted to the hospital with chest pain suggestive  of unstable angina.  Given his multiple cardiac risk factors and his ongoing  chest pain he was brought to the cardiac catheterization laboratory to  evaluate his coronary anatomy.   HEMODYNAMIC DATA:  The left ventricular pressures were 127/5 with the end-  diastolic pressure of 15 mmHg.  The aortic pressures were 127/67 mmHg.   LEFT VENTRICULAR DATA:  Left ventricle:  Left ventricular systolic function  was normal and the ejection fraction was estimated at 60%.  There was no  significant mitral regurgitation.  There was no significant wall motion  abnormalities.   CORONARY ANGIOGRAPHY:  Right coronary artery is a large caliber vessel and a  dominant vessel.  Proximal 30% stenosis, mid 80-90% stenosis, distal 80%  stenosis.  Large PDA origin.  The PDA ostium 90% stenosis.  PLV moderate  caliber, has 90% mid stenosis.   Left main coronary artery is a large caliber vessel.  It has mild  calcification.   Left circumflex is a large caliber vessel.  It has mild proximal  calcification.  Proximally it has 40% stenosis.  Gives origin to small OM1  and large OM2 and continues as a moderate sized obtuse marginal 3.  At the  bifurcation of OM2 and OM3 there is a 95% stenosis.  The OM2 which is large  has moderate diffuse disease and proximally has about 60% stenosis.   Left anterior descending is a large caliber vessel.  It gives origin to a  very large diagonal 1 which is __________ to the LAD.  The LAD itself has  moderate diffuse disease with 80% mid __________ lesions and 40-50% lesions  in the mid segment throughout.  The diagonal 1 which is very large has  proximal 90% stenosis.  The LAD itself is diffusely diseased in its distal  segment.   Right innominate and RIMA are widely patent.   Left subclavian artery and LIMA are widely patent.   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  They are  widely patent.  No evidence of  abdominal aortic aneurysm.  No significant aortic atherosclerosis was noted.   IMPRESSION:  1. Normal left ventricular systolic function, ejection fraction 60%.  2. Severe triple vessel coronary artery disease as described above.   RECOMMENDATIONS:  Based on his coronary anatomy, patient will benefit from  coronary artery bypass grafting.  This will be done during this  hospitalization.  Hence, a CVTS consultation will be obtained.  Aggressive  risk factor modification is indicated.   TECHNIQUE OF PROCEDURE:  Under the usual sterile precaution using a 6-French  right femoral artery access, a 6-French multipurpose BD catheter was  advanced in the ascending aorta with 0.035 inch J-wire.  The catheter was  then advanced to the left ventricle.  Left ventricular pressures were  monitored.  Hand contrast into the left ventricle was performed both in LAO  and RAO projection.  Then the catheter was pulled out of the body and a 6-  [place] Judkins left 4 diagnostic catheter was utilized to engage the left  main coronary artery and a 6-French Judkins  right 4 catheter was utilized to  engage the RCA and angiography was repeated.  The right coronary catheter  was utilized to engage the right innominate and left subclavian artery and  also to perform the abdominal aortogram.  Then the catheter was pulled out  of the body in the usual fashion.  The patient tolerated the procedure well.  No complications were noted.                                               Cristy Hilts. Jacinto Halim, M.D.    Pilar Plate  D:  09/03/2003  T:  09/03/2003  Job:  [id number]   cc:   Valetta Mole. Swords, M.D. [facility]   [facility]

## 2011-01-29 NOTE — Discharge Summary (Signed)
NAMEKAZIM, CORRALES NO.:  [id number]   MEDICAL RECORD NO.:  [id number]                   PATIENT TYPE:  INP   LOCATION:                                       FACILITY:  [facility]   PHYSICIAN:  Ranelle Oyster, M.D.             DATE OF BIRTH:  Feb 04, 1941   DATE OF ADMISSION:  09/18/2003  DATE OF DISCHARGE:  09/24/2003                                 DISCHARGE SUMMARY   DISCHARGE DIAGNOSES:  1. Deconditioning after coronary artery bypass graft.  2. Status post coronary artery bypass graft secondary to cardiovascular     disease.  3. History of right cerebrovascular accident.  4. History of diabetes mellitus.  5. History of hypertension.  6. History of dyslipidemia.  7. History of spasticity.  8. Anemia.   PAST SURGICAL HISTORY:  None.   MEDICATIONS PRIOR TO ADMISSION:  1. Lisinopril 10 mg daily.  2. Maxzide 37.5 mg daily.  3. Plavix 75 mg daily.  4. Aspirin 325 daily.  5. Dantrium 50 mg t.i.d.  6. Tylenol 500 mg p.o. t.i.d.   PRIMARY CARE Seymone Forlenza:  None at this time.   FAMILY HISTORY:  Positive for CVA and cardiovascular disease.   SOCIAL HISTORY:  The patient and his wife live in a one-level home in  [place], [place].  Independent prior to admission.  Uses a left  AFO and resting hand splint.  Denies tobacco or alcohol use at this time.   HOSPITAL COURSE:  Mr. Rishav Obriant was admitted to [facility] subacute  department on September 18, 2003, where he received one-hour therapies daily.  Overall, Mr. Burmester progressed very well during his six-day stay in subacute.  Discharged at an independent level.  Able to ambulate greater than 100 feet  with left AFO and quad cane.  Bed mobility:  Modified independent level.  Transfers:  Modified independent level.  Patient had no significant chest  pain while in rehab.  Patient remained on Plavix and aspirin for his MI  prophylaxis.  Patient was also placed on Zocor 20 mg p.o. q.h.s. for a  history of dyslipidemia.  Liver function remained stable.  Blood pressures  remained stable.  He remained on Lopressor as well as Lasix.  The parameters  were set for Lopressor due to occasional decreased blood pressure.  The  patient also had a history of diabetes.  CBGs under great control on diet-  control only.  Postoperatively, patient was receiving _________.  This was  increased from 100 mg 3 times daily to 100 mg 4 times daily due to increase  in spasticity.  Overall, the patient did tolerate therapies very well.  Patient was followed periodically by [facility].  His surgical incision healed  very, very well.  He was able to adhere to sternal precautions.  The patient  was also followed by cardiology, who is going to see him in  about 2-3 weeks.  There are no other major medical issues that occurred while the patient is  in rehab.   The latest labs indicated the latest hemoglobin was 10.2, hematocrit 30.3,  white blood cell count 8.5, platelet count 429.  The latest sodium 135,  potassium 3.8, chloride 99, carbon dioxide 29, BUN 13, creatinine 1.1, AST  17 and ALT 17.   Overall, the patient progressed fairly well and was discharged at a modified  independent level, able to ambulate greater than 200 feet, requiring pain.  Bed mobility to modified independent.  Transfer:  Modified independent.  All  ADLs are modified independent.  At the time of discharge, all vitals were  stable.  Surgical incisions were well healed, demonstrating no signs of  infection.  Patient was transferred home with his family.   DISCHARGE MEDICATIONS:  1. Aspirin 325 mg 1 tablet daily.  2. Plavix 75 mg daily.  3. Zocor 20 mg in the afternoon.  4. Lisinopril 10 mg daily.  5. Maxzide resume 3.75 mg daily.  6. Potassium 4 mg daily.  7. Dantrium 100 mg 4 times daily.  8. Oxycodone 5-10 mg every 4-6 hours as needed for pain.  9. Lopressor 25 mg 1 table twice daily.   Pain management with oxycodone.   ACTIVITY:   No driving.  Observe sternal precautions.  Use cane.  No smoking.  No alcohol.  No therapies are needed at this time.   He is to follow up with Dr. Dorris Fetch at [facility] on February 8 at 12:15.  Patient will also have x-rays prior to this appointment.  He is to follow up  with Dr. Domingo Sep, cardiologist, on February 2nd at 10:00 a.m.  Follow up  with Dr. Riley Kill for his scheduled appointment in February.      Drucilla Schmidt, P.A.                         Ranelle Oyster, M.D.    LB/MEDQ  D:  09/24/2003  T:  09/24/2003  Job:  [id number]

## 2011-01-29 NOTE — H&P (Signed)
NAMEWALKER, SITAR NO.:  [id number]   MEDICAL RECORD NO.:  [id number]          PATIENT TYPE:  REC   LOCATION:  TPC                          FACILITY:  [facility]   PHYSICIAN:  Dalia Heading, M.D.  DATE OF BIRTH:  1940/11/11   DATE OF ADMISSION:  09/02/2005  DATE OF DISCHARGE:  LH                                HISTORY & PHYSICAL   CHIEF COMPLAINT:  Hematochezia.   HISTORY OF PRESENT ILLNESS:  The patient is a 70 year old white male who was  referred for endoscopic evaluation, colonoscopy for intermittent  hematochezia.  He has been on Plavix.  No abdominal pain, weight loss,  nausea, vomiting, diarrhea, constipation, melena have been noted.  He has  never had a colonoscopy.  There is no family history of colon carcinoma.   PAST MEDICAL HISTORY:  1.  Coronary artery disease.  2.  Hypertension.  3.  CVA.   PAST SURGICAL HISTORY:  1.  CABG.  2.  Hernia surgery.   CURRENT MEDICATIONS:  Simvastatin, lisinopril,  triamterene/hydrochlorothiazide, metoprolol, potassium supplements,  Hydrocodone, baby aspirin.   ALLERGIES:  No known drug allergies.   REVIEW OF SYSTEMS:  Noncontributory.   PHYSICAL EXAMINATION:  GENERAL:  The patient is a well-developed, well-  nourished white male in no acute distress.  LUNGS:  Clear to auscultation with equal breath sounds bilaterally.  HEART:  Reveals a regular rate and rhythm without S3, S4, or murmurs.  ABDOMEN:  Soft, nontender, nondistended.  No hepatosplenomegaly or masses  are noted.  RECTAL:  Examination was deferred to the procedure.   IMPRESSION:  Hematochezia.   PLAN:  The patient is scheduled for a colonoscopy on October 12, 2005.  The  risks and benefits of the procedure, including bleeding and perforation,  were fully explained to the patient, who gave informed consent.      Dalia Heading, M.D.  Electronically Signed     MAJ/MEDQ  D:  09/23/2005  T:  09/23/2005  Job:  [id number]   cc:   Dalia Heading, M.D.  Fax: [phone]   [facility] Short Stay   Milus Mallick. Lodema Hong, M.D.  Fax: [phone]

## 2011-01-29 NOTE — H&P (Signed)
NAME:  Craig Fields, Craig Fields NO.:  [id number]   MEDICAL RECORD NO.:  [id number]          PATIENT TYPE:  AMB   LOCATION:  DAY                           FACILITY:  [facility]   PHYSICIAN:  Dalia Heading, M.D.  DATE OF BIRTH:  11/23/1940   DATE OF ADMISSION:  DATE OF DISCHARGE:  LH                                HISTORY & PHYSICAL   CHIEF COMPLAINT:  Left colon carcinoma.   HISTORY OF PRESENT ILLNESS:  The patient is a 70 year old white male who  recently underwent a colonoscopy for an intermittent hematochezia and was  found to have an invasive malignancy at approximately 40 cm from the anus.  He denies any abdominal pain, weight loss, nausea, vomiting, diarrhea,  constipation, melena.  There is no family history of colon carcinoma.   The patient has a significant history of coronary artery disease and a  stroke several years ago.  He was seen by Dr. Domingo Sep recently and a  Cardiolite stress test was unremarkable.  He has been cleared from the  cardiac standpoint to undergo surgery.   PAST MEDICAL HISTORY:  1.  Coronary artery disease.  2.  Hypertension.  3.  CVA.   PAST SURGICAL HISTORY:  1.  CABG.  2.  Inguinal herniorrhaphy.   CURRENT MEDICATIONS:  1.  Simvastatin 20 mg p.o. daily.  2.  Lisinopril 10 mg p.o. daily.  3.  Triamterine/hydrochlorothiazide 37.5/25 mg p.o. daily.  4.  Metoprolol 50 mg p.o. daily.  5.  Potassium supplement 20 mEq p.o. b.i.d.  6.  Hydrocodone as needed for pain.  7.  Baby aspirin which he is holding.  8.  Plavix which he is holding.   ALLERGIES:  No known drug allergies.   REVIEW OF SYSTEMS:  No recent chest pain, MI, or recent TIAs.   PHYSICAL EXAMINATION:  GENERAL:  The patient is a well-developed, well-  nourished white male in no acute distress.  He does have limited left arm  movement secondary to his previous stroke.  LUNGS:  Clear to auscultation with equal breath sounds bilaterally.  HEART:  Regular rate and rhythm  without S3, S4, or murmurs.  ABDOMEN:  Soft, nontender, nondistended.  No hepatosplenomegaly, masses or  inguinal lymphadenopathy is noted.   IMPRESSION:  Colon carcinoma.   PLAN:  The patient is scheduled for a left hemicolectomy with intraoperative  colonoscopy for neoplasm localization on November 01, 2005. The risks and  benefits of the procedure including bleeding, infection, cardiopulmonary  difficulties, blood transfusion, and a stroke were fully explained to the  patient who gave informed consent.      Dalia Heading, M.D.  Electronically Signed     MAJ/MEDQ  D:  10/28/2005  T:  10/28/2005  Job:  [id number]   cc:   [facility]  Fax: [phone]   Dani Gobble, MD  Fax: [phone]   Milus Mallick. Lodema Hong, M.D.  Fax: [phone]

## 2011-01-29 NOTE — H&P (Signed)
NAMEJAMEIRE, KOUBA NO.:  [id number]   MEDICAL RECORD NO.:  [id number]          PATIENT TYPE:  REC   LOCATION:  TPC                          FACILITY:  [facility]   PHYSICIAN:  Dalia Heading, M.D.  DATE OF BIRTH:  03/12/1941   DATE OF ADMISSION:  09/02/2005  DATE OF DISCHARGE:  LH                                HISTORY & PHYSICAL   CHIEF COMPLAINT:  Hematochezia.   HISTORY OF PRESENT ILLNESS:  The patient is a 70 year old white male who was  referred for endoscopic evaluation and colonoscopy for intermittent  hematochezia.  He has been on Plavix.  No abdominal pain, weight loss,  nausea, vomiting, diarrhea, constipation, or melena have been noted.  He has  never had a colonoscopy.   PAST MEDICAL HISTORY:  1.  Coronary artery disease.  2.  CVA.   CURRENT MEDICATIONS:  Simvastatin, lisinopril,  triamterene/hydrochlorothiazide, metoprolol, potassium supplements,  Hydrocodone, enteric aspirin.   ALLERGIES:  No known drug allergies.   REVIEW OF SYSTEMS:  Dictation stopped at this point.      Dalia Heading, M.D.  Electronically Signed     MAJ/MEDQ  D:  09/23/2005  T:  09/23/2005  Job:  [id number]

## 2011-01-29 NOTE — Assessment & Plan Note (Signed)
Craig Fields is back regarding his spastic left hemiparesis and pain.  He has  been doing a bit better over the last couple months.  He feels that the  Neurontin has helped somewhat as well as Zanaflex a lesser extent.  He  has also stopped his lipid lowering agents, but has not discussed this  with his cardiologist or family doctor.  His pain is 5-7/10.  Pain is  sharp.  Pain interferes with general activity, relations with others,  enjoyment of life on a moderate level.  Sleep is fair.   REVIEW OF SYSTEMS:  Notable for occasional dizziness, tingling,  weakness, trouble walking, easy bleeding, fever, night sweats,  constipation, and limb swelling.  Other pertinent positives are listed  above and full review is in the written health and history section of  the chart.   SOCIAL HISTORY:  The patient is married and living with his wife.   PHYSICAL EXAMINATION:  VITAL SIGNS:  Blood pressure 134/83, pulse 68,  respirations 20.  He is sating 95% on room air.  GENERAL:  The patient is pleasant, alert, and oriented x3.  EXTREMITIES:  He continues to walk with externally rotated left hip and  continues to have significant varus stress on the left knee and ankle.  Tone remains 2/4 still in the left leg and 3/4 in the left arm today.  He has edema in the left leg which is trace to 1+.  Leg is not  allogenic.  Color is good and pulses are 2+.  HEART:  Regular.  CHEST:  Clear.  ABDOMEN:  Soft, nontender.  The patient remains overweight.   ASSESSMENT:  1. Right cerebrovascular accident, spastic left hemiparesis.  2. Hypertension.  3. Constipation.  4. Multifactorial leg pain related to spasticity, neuropathic      symptoms, arthritis, and perhaps lipid lowering agents.   PLAN:  1. We will add low-dose Klonopin 0.5 mg q.12 h to see if this      initially helps with his lower extremity tone which is part of his      pain.  Ultimately, I told him that the way he is walking will continue to  worsen  his symptoms in the knee and ankle.  I think, we will get his  pain and spasticity down a bit in the short-term that we could explore  physical therapy to work on his form with his walking.  He understands  this concept.  1. We will stick with Zanaflex 4-8 mg twice a day.  He is receiving      hydrocodone from his family doctor which is      fine for more severe breakthrough pain.  2. I will see him back in about 3 months.      Ranelle Oyster, M.D.  Electronically Signed     ZTS/MedQ  D:  05/30/2009 11:39:59  T:  05/31/2009 02:21:34  Job #:  [id number]   cc:   Teola Bradley, M.D.  Fax: [phone]   Milus Mallick. Lodema Hong, M.D.  Fax: [phone]

## 2011-01-29 NOTE — Assessment & Plan Note (Signed)
MEDICAL RECORD NUMBER:  [id number].   Craig Fields is here regarding his spastic left hemiparesis. He states he has had a  little bit of movement return to the left arm since I last saw him. His  biggest problem is some curling of the left middle toes which is causing  pain and excessive shoe wear and calluses. He is seeing a podiatrist who has  provided some padding and some hygiene to the foot. He needs a new insert  for his shoe. The patient notes pain is worse with walking and standing.  Improves somewhat with rest. 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 Left hand is nonfunctional currently although he does have  some movement by report.   SOCIAL HISTORY:  Without change. His wife remains very supportive.   REVIEW OF SYSTEMS:  The patient reports trouble walking and pain in the left  foot as mentioned above. Full review of systems is in the health and history  section.   PHYSICAL EXAMINATION:  VITAL SIGNS:  Blood pressure is 130/80, pulse is 58,  respiratory rate 16. He is saturating 97% on room air.  GENERAL:  The patient is pleasant in no acute distress. He is alert and  oriented x3. Affect is bright and appropriate.   Gait is of a steppage variety. He tends to walk with some external rotation  of the left foot as well. Still is weak with ankle dorsi flexion and plantar  flexion today. Coordination was fair otherwise in the right lower extremity.  Deep tendon reflexes were hyperactive in the left. Sensation is decreased in  the left arm and hand today. Left hand movement was trace to 1/5 but  difficult to assess. The elbow function was 1+ to 2/5, and shoulder was 1+  to 2/5. The patient had significant tone in the left upper extremity, and I  rated his wrist tone at 3+ to 4/4. No skin breakdown was seen. The patient  had trace edema in the hand. Cranial nerve exam was fairly unremarkable  today. Cognitively, the patient is appropriate. He remains slightly  obese.   ABDOMEN:  Soft and nontender.  CHEST:  Clear.  HEART:  Regular rate and rhythm today.   ASSESSMENT:  1.  Right cerebral vascular accident with spastic left hemiparesis.  2.  Hypertension.  3.  Constipation.   PLAN:  1.  We will try a phenol injection to the left median nerve at the elbow.      Also consider Botox to the left flexor digitorum brevus and longus to      help with his toe symptoms although there may be some other issues      leading to the hammer toe deformities aside from spasms. Recommend      podiatry follow up and a modification of shoe wear for now.  2.  Continue Plavix for stroke prophylaxis.  3.  Will seen the patient back pending arrangement of his phenol injection.     Ranelle Oyster, M.D.  Electronically Signed    ZTS/MedQ  D:  09/08/2005 12:32:20  T:  09/08/2005 16:19:32  Job #:  [id number]

## 2011-01-29 NOTE — Discharge Summary (Signed)
Craig Fields, Craig Fields NO.:  [id number]   MEDICAL RECORD NO.:  [id number]                   PATIENT TYPE:  PS   LOCATION:  4037                                 FACILITY:  [facility]   PHYSICIAN:  Ranelle Oyster, M.D.             DATE OF BIRTH:  Oct 06, 1940   DATE OF PROCEDURE:  DATE OF DISCHARGE:  02/19/2002                      STAT - MUST CHANGE TO CORRECT WORK TYPE   DISCHARGE DIAGNOSES:  1. Right nonhemorrhagic infarct.  2. History of hypotension.  3. History of sleep apnea.  4. Back pain.   HISTORY OF PRESENT ILLNESS:  The patient is a 70 year old, right-handed,  white male with a positive history of hypertension and kidney stones, who  presents on Jan 15, 2002, for left-sided weakness and slurred speech.  MRI  revealing nonhemorrhagic infarction in the frontal temporal white matter of  the corpus callosum.  Carotid Doppler was performed revealing no internal  carotid artery stenosis.  The patient was placed on aspirin and Plavix for  CVA prophylaxis.  PT report had patient for transfer mod assist in bed  mobility.  Neurology Dr. Grandville Silos.  Hospital course significant for  hypokalemia and sleep apnea.  The patient was ambulating approximately 5  feet.  The patient will have sleep study performed after discharge.  The  patient was transferred to [facility] on Jan 18, 2002.   PAST MEDICAL HISTORY:  1. Hypertension.  2. Left inguinal hernia.  3. Obesity.   MEDICATIONS:  HCTZ, aspirin.   PRIMARY CARE Devaun Hernandez:  Dr. Olin Hauser at the [facility].   ALLERGIES:  None.   SOCIAL HISTORY:  The patient lives with wife in one-level home in  [place], [place], works as a Merchandiser, retail at [employer].  Fairly  inactive.  He has one step to entry.  Occasional alcohol.  Chews tobacco.  Independent prior to admission.   FAMILY HISTORY:  Father had a CVA in his 63's.  Mother deceased from cancer.   REVIEW OF SYSTEMS:   Significant for GI bleed.  No chest pain or shortness of  breath.   HOSPITAL COURSE:  The patient now admitted to [facility] Rehab Department on  Jan 18, 2002, for comprehensive rehabilitation where he received more than  three hours of PT/OT therapy daily.  His hospital course was significant for  the following.  1. Overall the patient made fair progress during rehab and tolerated     therapies.  He was made minimum assist to ambulation and needed minimum     assist with some ADLs.  He made very slow progress during his 13 day stay     in rehab.  He remained on aspirin and Plavix for CVA prophylaxis.  He was     started on subcu heparin for DVT prophylaxis.  He had no new neurological     symptoms during rehab.  He had significant  arthritic pain and     occasionally complained of back pain radiating to the left hip.  He was     started on Vioxx 25 mg p.o. q.d. on day one of rehab.  The patient     developed some nausea on Jan 21, 2002.  Therefore, Vioxx was placed on     hold.  He received __________ mg q.6h. p.r.n. for nausea.  The main cause     of nausea we thought was the patient did not have a bowel movement for     several days.  Although he has received laxative of choice and sorbitol.     He had a KUB performed on Jan 25, 2002, to rule out impaction.  KUB     revealed normal bowel gas pattern.  After receiving several doses of     sorbitol and laxative, the patient finally had a bowel movement.  He was     fitted for a custom AFO on the left lower extremity on Feb 08, 2002.     Vioxx was restarted on February 11, 2002, due to significant agitation of the     back pain.  He also received a __________ p.r.n.  It was thought the pain     he had was possibly neuropathy and back pain, but he stated it was still     radiating down his left leg and therefore, he was started on Neurontin     300 mg p.o. q.h.s. on February 14, 2002, and he also was started on baclofen     for some spasms.  Neurontin  was tapered accordingly as well as the     baclofen and the Vioxx.  After adjustments were made in these     medications, back pain and leg pain did improve.  2. HYPOTENSION:  At the time of admission, the patient was receiving     Accupril as well as Maxzide.  It was noted on Jan 30, 2002, that the     patient began to have elevated BUN.  Therefore, Maxzide was placed on     hold.  And he was encouraged to push fluids.  Maxzide was restarted on     Feb 03, 2002.  On Feb 09, 2002, it was noted that blood pressure began to     elevate.  His __________ was elevated from 10 to 20 mg q.d.  No other     further adjustments were made in antihypertensive medications.  Besides     decreased appetite, there were no other major medical issues occurred     while he in rehab, and he was started on Megace, and this was     discontinued prior to discharge.   Latest labs indicated that his hemoglobin was 16.1, hematocrit 47.2, white  blood cell count 9.1, platelet count 148.  Latest potassium was 3.7.  Latest  sodium was 134, chloride 101, CO2 27, BUN 20, creatinine 1.1, glucose 116.  His AST was 34, ALT 47.  Urine cultures performed on Jan 18, 2002, was  demonstrating no growth x 1 day.  At time of discharge, blood pressure was  144/73, respiratory rate 20, pulse 79, temperature 96.8.  PT report  indicated patient was ambulating with minimum assist but required cane.  In  transfer, squat to pivot was close supervision.  Bed mobility modified  independent.  He could perform most ADLs with some minimum assist to  modified independently.  Overall patient requires minimum assist to  close  supervision due to decreased standing and balance.  The patient made good  progress and met all three long-term goals.  The patient required minimum  assist during those times where he is easy to fatigue, on indoor-outdoor  surfaces, and wheelchair.  The patient is strong and works diligently and has a thorough  understanding of his strengths and limitations.  Overall, he  had great progress with speech.  He is currently on a regular diet.  Speech  has improved to 100% in conversation; cognition appears functional.  The  patient was discharged home with his family.   DISCHARGE MEDICATIONS:  1. Plavix 75 mg q.d.  2. Aspirin 325.  3. Coreg q.d.  4. Lisinopril 20 mg q.d.  5. Vioxx 25 mg 1 pill q.d.  6. Maxzide 37.5/25 q.d.  7. Tylenol 325-650 q.4-6h. p.r.n. pain.  8. Baclofen 10 mg t.i.d.  9. Neurontin 300 mg t.i.d.    DISCHARGE INSTRUCTIONS:  Use walker.  No driving, no drinking.  Wear AFO on  the left lower extremity.  Low salt.  No smoking.  No alcohol.  He will have  [facility] for PT and OT.  He will follow up with Dr. Faith Rogue, April 02, 2002, at 12:00.  Follow up with Dr. Olin Hauser within two  weeks.     Junie Bame, R.N.                       Ranelle Oyster, M.D.    LH/MEDQ  D:  04/22/2002  T:  04/22/2002  Job:  [id number]   cc:   Olin Hauser, M.D.

## 2011-01-29 NOTE — H&P (Signed)
NAME:  Craig Fields, Craig Fields                           ACCOUNT NO.:  [id number]   MEDICAL RECORD NO.:  [id number]                   PATIENT TYPE:   LOCATION:                                       FACILITY:  [facility]   PHYSICIAN:  Dalia Heading, M.D.               DATE OF BIRTH:  07/16/1941   DATE OF ADMISSION:  05/25/2004  DATE OF DISCHARGE:                                HISTORY & PHYSICAL   CHIEF COMPLAINT:  Left inguinal hernia.   HISTORY OF PRESENT ILLNESS:  The patient is a 70 year old white male status  post a cerebrovascular accident with left sided weakness and coronary artery  disease who presented to the emergency room approximately 1 week ago with an  incarcerated left inguinal hernia. It was reduced in the emergency room. He  now comes for a formal left inguinal herniorrhaphy.   PAST MEDICAL HISTORY:  Coronary artery disease, left sided cerebrovascular  accident.   PAST SURGICAL HISTORY:  Coronary arterial bypass grafting several years ago.   CURRENT MEDICATIONS:  Metoprolol, Zocor, potassium supplements, Lisinopril,  Vicodin p.r.n., Triamterene/hydrochlorothiazide.   ALLERGIES:  No known drug allergies.   REVIEW OF SYSTEMS:  The patient denies any recent chest pain, myocardial  infarction, diabetes mellitus, or bleeding disorder. He has been on Plavix  and aspirin but they are on hold.   PHYSICAL EXAMINATION:  GENERAL:  The patient is a pleasant 70 year old white  male who is in no acute distress.  VITAL SIGNS:  Afebrile. Vital signs are stable.  LUNGS:  Clear to auscultation with equal breath sounds bilaterally.  HEART:  Examination reveals regular rate and rhythm without S3, S4, or  murmurs.  ABDOMEN:  Soft, nontender, nondistended. Reducible left inguinal hernia is  noted. No right inguinal hernia is noted.  GENITOURINARY:  Examination is within normal limits.   IMPRESSION:  Left inguinal hernia.   PLAN:  The patient is scheduled for left inguinal herniorrhaphy on  May 25, 2004. The risks and benefits of the procedure including bleeding,  infection, and recurrence of the hernia were fully explained to the patient  and he gave informed consent.     ___________________________________________                                         Dalia Heading, M.D.   MAJ/MEDQ  D:  05/21/2004  T:  05/21/2004  Job:  [id number]   cc:   Milus Mallick. Lodema Hong, M.D.  [address]  [place], [place] [address]  Fax: [phone]

## 2011-01-29 NOTE — Assessment & Plan Note (Signed)
Craig Fields is back regarding his spastic left-sided hemiparesis.  He had good  results with the Botox injections to his left quadriceps muscles.  He has  had some good results with the phenol as well to the left median nerve.  He  does note some increase in tone in the wrist, however, the last few weeks.  Left elbow and shoulder also are still problems.  He continues to splint the  hand as able and perform range of motion exercises.  He feels that his gait  is improved with better swing of the knee in swing phase of his gait.  Patient does have shoulder discomfort with dressing tasks.  He also has  difficulties in any kind of over the shoulder movement.   REVIEW OF SYSTEMS:  Patient reports weakness, tingling, numbness, trouble  walking, dizziness, limb swelling, easy bruising.   SOCIAL HISTORY:  Patient is married without change today.   PHYSICAL EXAMINATION:  VITAL SIGNS:  Blood pressure is 146/84, pulse is 70,  respiratory rate 16.  He is saturating 96% on room air.  GENERAL:  Patient is pleasant, no acute distress.  He remains overweight.  HEART:  Regular rate and rhythm.  LUNGS:  Clear.  ABDOMEN:  Soft, nontender.  NEUROLOGIC:  Patient is alert and oriented.  Gait is wide based.  The left  leg remains rigid but he has better bend in the knee with swing phase.  He  also seems to extend the knee better as he is transferring from a sit to  stand position.  Coordination is unchanged in the left side.  Reflexes are  3+ on the left.  He has 2-3/5 motor function in the left lower extremity.  Left upper extremity motor function is 1+/5.  Tone is 2+ at the wrist, 1+ at  the elbow, 2 at the shoulder for the pectoralis major and latissimus dorsi  muscle.  The  teres major may be involved.  Cranial nerve examination is  intact.  Cognitively patient is appropriate.  Sensation remains decreased in  the left side at 1/2.   ASSESSMENT:  1.  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  2.  Hypertension.  3.  Constipation history.   PLAN:  1.  Will observe for now.  Patient does not want to be overly aggressive at      this point.  He has had some results with the injections we performed      but long-term results have not been overwhelming at this point.  He may      be a candidate for a Baclofen pump but I do not believe he wants to go      that route.  2.  Continue Plavix for stroke prophylaxis.  3.  I will see the patient back in six months.      Ranelle Oyster, M.D.  Electronically Signed     ZTS/MedQ  D:  04/06/2006 11:31:16  T:  04/06/2006 11:46:18  Job #:  [id number]

## 2011-01-29 NOTE — Assessment & Plan Note (Signed)
The patient is back regarding his left spastic hemiparesis.  He had some  positive effects with the Botox injection into the left elbow and wrist.  Left fingers remained clinched.  Left leg has been stable.  He does have a  pair of new shoes pending which we hope will provide a better fit since he  has lost some weight.  He is to continue to walk and try to stay active.  He  has had no other new problems since our last visit.   REVIEW OF SYSTEMS:  Denies any new review of systems changes.  Please see  review of systems section in the health and history portion of the chart.   PHYSICAL EXAMINATION:  VITAL SIGNS:  Blood pressure 99/59, pulse 55,  respiratory rate 20, saturating 98% on room air.  GENERAL:  The patient walks with externally rotated gait pattern on the left  side with some steppage noted.  Affect is bright and appropriate.  Appearance is well-kept.  Tone ranges from 3 to 4 to 1 out of 4 in the arm  to the leg.  Muscle examination is 1 to 2 out of 5 in the upper extremity  and 3+ to 4+ out of 5 in the lower extremity on the left side.  Sensory  examination is grossly intact.  Deep tendon reflexes are 2++.  Cognitively,  the patient is appropriate.  No cranial nerve examination changes were noted  today.   ASSESSMENT:  1.  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  2.  Hypertension.  3.  Constipation.   PLAN:  1.  We discussed the possibility of phenol injections today.  The patient      decided that he would like to hold off on these for the timebeing.  He      is not too unhappy with what he has had.  2.  We will await his new shoe wear to see if this helps with some of his      excessive plantar flexion of the toes.  3.  Continue other medications as is for now.  4.  Will see the patient back in about six months for a checkup.      Zach   ZTS/MedQ  D:  09/09/2004 12:39:28  T:  09/09/2004 12:57:35  Job #:  [id number]

## 2011-01-29 NOTE — Procedures (Signed)
NAMERONALD, LONDO NO.:  [id number]   MEDICAL RECORD NO.:  [id number]          PATIENT TYPE:  REC   LOCATION:  [facility]                          FACILITY:  [facility]   PHYSICIAN:  Ranelle Oyster, M.D.DATE OF BIRTH:  01/03/1941   DATE OF PROCEDURE:  02/04/2006  DATE OF DISCHARGE:                                 OPERATIVE REPORT   PROCEDURE:  Phenol injection.   DIAGNOSIS:  Spastic hemiplegia, left side, diagnostic code 342.12.   DESCRIPTION OF PROCEDURE:  After informed consent and preparation of the  skin with isopropyl alcohol, I utilized a monopolar 50 mm 26-gauge  injectable needle with the reference electrode connected to the elbow.  Going from 80 mA down to 0.05 mA, we localized the median nerve at the  antecubital fossa on the left.  After drawing back, 5 mL of aqueous phenol  solution was injected into the left elbow.  The patient had instant release  of the wrist and finger musculature.  He still has underlying contractures.   We will have the patient work on home exercises alone.  If he does not feel  that he is progressing, we will look into outpatient OT.  He was given  postinjection instructions.  I will see him back in the office in two  months' time.      Ranelle Oyster, M.D.  Electronically Signed     ZTS/MEDQ  D:  02/04/2006 12:50:15  T:  02/05/2006 12:51:33  Job:  [id number]

## 2011-01-29 NOTE — Assessment & Plan Note (Signed)
HISTORY:  The patient is back regarding his spastic left hemiparesis.  He  came to the rehabilitation unit after a coronary artery bypass graft surgery  and some deconditioning.  He is improved to modified independent at the  household level.  He is still having some problems with his endurance.  He  is being followed closely by cardiology postoperatively.  He notes that the  left hand has not really improved dramatically with an increase in Dantrium.  He is able to wear a splint at night time, and is not having pain in the  left hand or wrist.  The left elbow seems a bit better.  The left leg may  have responded somewhat to the Dantrium increase.  He is ambulating daily.  His mood remains stable.  He has done some more in regards to his ADL's.  He  has prepared some simple meals.  He is troubled with constipation and is  using Senokot daily.  He is using Plavix for stroke prophylaxis, as well as  cardiac prophylaxis.   REVIEW OF SYSTEMS:  The patient denies any new chest pain or shortness of  breath, nausea, vomiting, diarrhea, or constipation.  No wheezing, coughing,  problems with vision, or sleep.  No mood issues.  Denies any new spasms,  weakness, numbness, or vertigo.  No fever or chills.  He does note a  decreased appetite and some overall weight loss from before his coronary  artery bypass graft surgery.   PHYSICAL EXAMINATION:  VITAL SIGNS:  Blood pressure 113/74, pulse 91,  saturation 91% on room air.  NEUROLOGIC:  The patient has an improved gait today with some circumduction  of the left leg, but less external rotation than he has had, and better foot  clearance overall.  He seems much more efficient than he has in prior  visits.  The left upper extremity remains spastic and the wrist and hand  remain 2+/4.  His left elbow is 1/4.  His left shoulder is 1+ to 2/4.  He  has 1+ to 2 strength in the left arm and 2 to 2+ in the left lower  extremity.  His skin is intact.   Cognition remains appropriate.  Cranial  nerve examination is stable.   ASSESSMENT:  1. Right cerebrovascular accident with a spastic left hemiparesis.  2. Coronary artery disease, status post coronary artery bypass graft     surgery.  3. Hypertension.  4. Constipation.   PLAN:  1. Continue with his Dantrium 100 mg q.i.d.  2. He had recent normal liver function tests and will check these at a later     date.  3. He will continue with Senokot for constipation.  4. He will continue with Plavix for stroke and coronary artery disease     prophylaxis.  5. Will consider Botox injections of the left arm at a later point, but I     think he has made some gains with the Dantrium at this time.  I would     like to see how he does as he gets more strength back after this recent     coronary artery bypass graft surgery, and we will revisit Botox possibly     in June or July.  He has had modest results previously with the Botox.  6. I will see the patient back in June.      Ranelle Oyster, M.D.   ZTS/MedQ  D:  10/29/2003 13:55:25  T:  10/29/2003 14:52:24  Job #:  [id number]   cc:   Wendi Maya, M.D.

## 2011-01-29 NOTE — Assessment & Plan Note (Signed)
HISTORY OF PRESENT ILLNESS:  The patient is back regarding his left-  sided spastic hemiparesis.  I saw him about nine months ago.  He has had  continued spasticity in the left side.  He has done well with Botox to a  certain extent.  We have done phenol injections as well though his  spasticity usually returns after a period of time.  We have tried him on  Dantrium and Baclofen in the past with suboptimal results.  He does try  regularly to stretch and walk.  He goes up to a mile if possible with  walking unless he has pain develop.  Patient has been fairly stable from  a medical standpoint.  He remains on Plavix for stroke prophylaxis.  Blood pressure has been under fair control.  His wife recently has  become ill and had some pain complications of her own.   REVIEW OF SYSTEMS:  Notable for bladder control problems, weakness,  numbness, trouble walking, easy bleeding and limb swelling.  He had his  shoes and brace recently adjusted by [facility] today.   SOCIAL HISTORY:  Patient is married.  Lives with his wife and other  pertinent positives are above.   PHYSICAL EXAMINATION:  VITAL SIGNS:  Blood pressure 130/87, pulse 75,  respiration 18.  O2 sat 97 percent on room air.  GENERAL:  Patient generally pleasant.  No acute distress.  He is alert  and oriented x3.  Remains overweight.  Patient is generally appropriate.  HEART:  Regular rate.  CHEST:  Clear.  ABDOMEN:  Soft, nontender.  EXTREMITIES:  Gait is wide based.  He continues to circum-duct the left  leg with swing phase.  He transfers well.  He has significant tone at  the wrist at 3/4, 2/4 tone at the elbow and shoulder.  Strength in the  left upper extremity is 1+/5.  Lower extremity movements 2-3/5 proximal,  1+-2 distal at the ankle.  NEUROLOGIC:  Cranial nerve exam is intact.  Cognitively is appropriate.  Sensation is decreased in the left to 1/2.   ASSESSMENT:  1. Right cerebrovascular accident.  Spastic left  hemiparesis.  2. Hypertension.  3. Constipation history.   PLAN:  1. Will try Zanaflex 2 mg q h.s. titrating up to q.i.d. over 20 days      time.  May benefit from anticonvulsant as well.  2. Continue Plavix for stroke prophylaxis.  3. I will see the patient back in about two months time.      Ranelle Oyster, M.D.  Electronically Signed     ZTS/MedQ  D:  01/16/2007 16:38:14  T:  01/17/2007 06:40:40  Job #:  [id number]

## 2011-01-29 NOTE — Op Note (Signed)
NAME:  Craig Fields, Craig Fields                           ACCOUNT NO.:  [id number]   MEDICAL RECORD NO.:  [id number]                   PATIENT TYPE:  AMB   LOCATION:  DAY                                  FACILITY:  [facility]   PHYSICIAN:  Dalia Heading, M.D.               DATE OF BIRTH:  05-01-1941   DATE OF PROCEDURE:  05/25/2004  DATE OF DISCHARGE:                                 OPERATIVE REPORT   PREOPERATIVE DIAGNOSIS:  Left inguinal hernia.   POSTOPERATIVE DIAGNOSIS:  Left inguinal hernia, direct and indirect.   PROCEDURE:  Left inguinal herniorrhaphy.   SURGEON:  Dalia Heading, M.D.   ANESTHESIA:  Spinal.   INDICATIONS:  The patient is a 70 year old white male who presented to the  emergency room over a week ago with an incarcerated left inguinal hernia.  This was reduced in the emergency room. The patient now comes to the  operating room for a left inguinal herniorrhaphy. The risks and benefits of  the procedure including bleeding, infection, pain, and the possibility of  recurrence of the hernia were fully explained to the patient, who gave  informed consent.   PROCEDURE NOTE:  The patient the patient was placed in the supine position  after spinal anesthesia was administered. The left groin region was prepped  and draped using the usual sterile technique with Betadine. Surgical site  confirmation was performed.   A transverse incision was made in the left groin region down to the external  oblique aponeurosis. The aponeurosis was incised to the external ring. A  Penrose drain was placed around the spermatic cord. The vas deferens was  noted in the spermatic cord. 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 The patient was noted to have  indirect and  direct hernia sacs. Both were reduced after being freed away from the  spermatic cord. A medium-size polypropylene mesh plug was then placed in the  indirect hernia, and a large-size polypropylene mesh  was placed in the  direct hernia. Both were secured to the transverse ___________ fascia using  a 2-0 Novofil interrupted suture. An [place] polypropylene mesh patch was then  placed along the floor of the inguinal canal and secured superiorly to the  conjoined tendon and inferiorly to the shelving edge of Poupart's ligament  using a 2-0 Novofil interrupted suture. The internal ring was recreated a 2-  0 Novofil interrupted suture. The external oblique aponeurosis was  reapproximated using a 2-0 Vicryl running suture. Subcutaneous layer was  reapproximated using a 3-0 Vicryl interrupted suture. The skin was closed  using a 4-0 Vicryl subcuticular suture. Sensorcaine 0.5% was instilled into  the surrounding wound, and the wound was covered with a liquid dressing.   All tape and needle counts were correct at the end of the procedure. The  patient was transferred to PACU in stable condition.  COMPLICATIONS:  None.   SPECIMENS:  None.   BLOOD LOSS:  Minimal.      ___________________________________________                                            Dalia Heading, M.D.   MAJ/MEDQ  D:  05/25/2004  T:  05/25/2004  Job:  [id number]   cc:   Milus Mallick. Lodema Hong, M.D.  [address]  [place], [place] [address]  Fax: [phone]

## 2011-01-29 NOTE — Consult Note (Signed)
NAME:  Craig Fields, Craig Fields                           ACCOUNT NO.:  [id number]   MEDICAL RECORD NO.:  [id number]                   PATIENT TYPE:  EMS   LOCATION:  ED                                   FACILITY:  [facility]   PHYSICIAN:  Dalia Heading, M.D.               DATE OF BIRTH:  [birth year]   DATE OF CONSULTATION:  DATE OF DISCHARGE:                                   CONSULTATION   REASON FOR CONSULTATION:  Incarcerated left inguinal hernia.   HISTORY OF PRESENT ILLNESS:  The patient is a 70 year old white male with  coronary artery disease and a history of cerebrovascular accident who  presented with left groin pain from trying to have a bowel movement.  He has  a known history of a left inguinal hernia which has never been repaired.  A  mass developed in the left groin region.  It could not be initially reduced.  A surgery consultation was obtained.   PAST MEDICAL HISTORY:  1.  Includes coronary artery disease.  2.  Cerebrovascular accident.   PAST SURGICAL HISTORY:  Coronary artery bypass graft several years ago.   CURRENT MEDICATIONS:  1.  Plavix.  2.  Metoprolol.  3.  Zocor.  4.  Potassium supplements.  5.  Regular aspirin.  6.  Lisinopril 10 mg p.o. daily.  7.  Vicodin p.r.n. pain.  8.  Triamterene.  9.  Hydrochlorothiazide one tablet p.o. daily.   ALLERGIES:  No known drug allergies.   REVIEW OF SYSTEMS:  Noncontributory.   PHYSICAL EXAMINATION:  GENERAL:  The patient is a pleasant, 70 year old  white male, in no acute distress.  VITAL SIGNS:  He is afebrile.  Vital signs are stable.  ABDOMEN:  Soft with an incarcerated left inguinal hernia present.  This was  reduced at bedside.  No other masses were noted.  No hepatosplenomegaly or  lesions were noted.  The patient reported resolution of his pain.   IMPRESSION:  Incarcerated left inguinal hernia, resolved.   PLAN:  The patient has been told to hold his Plavix.   FOLLOWUP:  He is to follow up in my office later  this week to scheduled a  left inguinal herniorrhaphy on an elective basis.  He is to hold his Plavix  so that we could be able to do a spinal anesthetic.      ___________________________________________                                            Dalia Heading, M.D.   MAJ/MEDQ  D:  05/16/2004  T:  05/16/2004  Job:  [id number]   cc:   Milus Mallick. Lodema Hong, M.D.  [address]  [place], [place] [address]  Fax: [phone]

## 2011-01-29 NOTE — Discharge Summary (Signed)
NAMELOGIN, MUCKLEROY NO.:  [id number]   MEDICAL RECORD NO.:  [id number]                   PATIENT TYPE:  INP   LOCATION:  2020                                 FACILITY:  [facility]   PHYSICIAN:  Salvatore Decent. Dorris Fetch, M.D.         DATE OF BIRTH:  1941/06/13   DATE OF ADMISSION:  09/02/2003  DATE OF DISCHARGE:                                 DISCHARGE SUMMARY   DATE OF SURGERY:  September 10, 2003.   DATE OF DISCHARGE:  Anticipated date of discharge September 18, 2003.   ADMISSION DIAGNOSES:  Chest pain consistent with unstable angina.   PAST MEDICAL HISTORY:  1. Hypertension.  2. 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  3. Obesity.  4. Borderline diabetes mellitus type 2.  5. History of nephrolithiasis.   ALLERGIES:  Patient has no known drug allergies, however, he is ALLERGIC TO  BEES.   DISCHARGE DIAGNOSES:  Severe three vessel coronary artery disease, status  post coronary artery bypass grafting X4.   HISTORY OF PRESENT ILLNESS:  Briefly, the patient is a 70 year old Caucasian  man.  He presented to the emergency department at [facility] with complaint that he woke up early the morning of September 02, 2003 with right shoulder pain radiating to his chest.  This he described as  a pressure rated as a 5 out of 10, no associated shortness of breath or  diaphoresis.  He was seen by the medical teaching service in the emergency  department who felt the differential diagnoses included pulmonary embolus,  musculoskeletal pain, myocardial infarction, gastroesophageal reflux disease  or infection.  Studies were ordered to rule out all of these possibilities.  A cardiology consult was also requested and the patient was evaluated by Dr.  Domingo Sep.  The patient continued to have intermittent mild substernal chest  pain during his time in the emergency department.  Dr. Domingo Sep felt in  view  of his intermittent chest pain as well as his cardiac risk factors, that  included hypertension and strong family history of coronary artery disease,  the patient should undergo cardiac catheterization for definitive  delineation of his coronary anatomy.  The procedure risks and benefits were  discussed with the patient and he agreed to proceed with cardiac  catheterization.  Dr. Jacinto Halim performed this on September 03, 2003.  Catheterization revealed severe three vessel coronary artery disease with  preserved left ventricular function.  Ejection fraction was estimated to be  60%.  As his lesions were not amenable to percutaneous coronary  intervention, cardiac surgery consultation was requested.  He was seen later  in the day by Dr. Evelene Croon.  After examination of the patient and review  of the available records Dr. Laneta Simmers felt that coronary artery bypass  grafting would be the best treatment choice for this gentleman.  He  acknowledged that he would expect to have a slow recovery and high risk for  postoperative complications due to his obesity and prior history of stroke  with left hemiparesis.  He has also been taking Plavix and was recommended  that he should be off this for five to seven days prior to surgery to  decrease bleeding. The procedure risks and benefits were all discussed with  the patient and he agreed to proceed with the surgery.  The surgery was  tentatively scheduled for Monday, September 10, 2003.   Preoperative arterial evaluation was performed on September 04, 2003  including carotid Doppler study which revealed bilateral mild heterogeneous  plaque at the bifurcation and origin of the ICA and ECA.  No significant  internal carotid artery stenosis.  Next his lower extremity evaluation  included ankle brachial indexes which were greater than 1.0 bilaterally.   The patient was seen in consultation by the smoking cessation counselor for  his tobacco chewing habit.   The patient said that he would not have any  problems staying quit after discharge.  He will be followed in the  postoperative period via telephone calls.   A physical therapy consultation was performed on September 04, 2003.  Their  recommendation was for cardiac rehabilitation and mobility team and reorder  for physical therapy after his surgery.   On September 08, 2003 the patient was evaluated by Dr. Charlett Lango.  The procedure risks and benefits of coronary artery bypass grafting were  again discussed with the patient and he agreed to proceed with the surgery  scheduled for Tuesday, September 10, 2003.  The patient remained stable while  awaiting his surgery.   On September 10, 2003 the patient underwent the above surgical procedure with  Dr. Charlett Lango.  Coronary artery bypass grafting X4 performed with  grafts placed at time of procedure including left internal mammary artery  graft to left anterior descending artery, saphenous vein graft to posterior  descending artery, saphenous vein graft to the ramus intermedius artery,  saphenous vein was grafted to the second obtuse marginal artery.  Vein was  harvested from the right thigh through the lower calf via the Endovein  harvesting technique.  He tolerated this procedure well and was transferred  in stable condition to the SICU.  He remained hemodynamically stable in the  immediate postoperative period,  was extubated several hours after arriving  in the intensive care unit.  The patient awoke with his neurological system  at baseline.  The patient required only routine care in the intensive care  unit.  His progress was slow due to his obesity and his old cerebrovascular  accident.  He was ready for transfer out of the intensive care unit on  postoperative day #2.  Case management has followed the patient throughout his hospital stay.  Dr. Riley Kill had been following the patient as an  outpatient since his stroke in May  2003.  If the patient requires inpatient  rehabilitation or SACU stay, Dr. Riley Kill will follow him.   Over the next several days the patient continued to make progress in  recovering from his surgery.  This morning, September 17, 2003 is postoperative  day #7.  The patient reported that he felt stronger today.  His vital signs  are stable with blood pressure 101/63, he is afebrile, his room air  saturation is 97%.  His weight is 263.5 pounds which is just above his  preoperative weight of 262 pounds.  His heart  is maintaining normal sinus  rhythm.  His lungs are clear to auscultation.  His wounds are all healing  well.  He does have mild right lower extremity edema.  The patient is  medically stable for transfer to rehabilitation or SACU.  Dr. Riley Kill did  consult on the patient yesterday, September 16, 2003 and felt that the patient  could benefit from CIR level of therapies to address his mobility and  activities of daily living issues. However, the patient will require pre  certification from his insurance company.  This is being carried out today.  The patient is ready for transfer to [facility] when a bed is available.   LABORATORY DATA:  On September 17, 2003 chemistries show sodium 136, potassium  3.6, BUN 15, creatinine 1.0, glucose 128.  On September 13, 2003 CBC with  white blood cell count 9.1, hemoglobin 9.9, hematocrit 28.9, platelet count  162,000.   CONDITION ON DISCHARGE:  Improved.   DISCHARGE INSTRUCTIONS:   DISCHARGE MEDICATIONS:  1. Zocor 20 mg p.o. 1800 hours.  2. Dantrolene 100 mg p.o. b.i.d.  3. Plavix 75 mg p.o. daily.  4. Aspirin 325 mg p.o. daily.  5. Colace 200 mg p.o. daily.  6. Lopressor 25 mg p.o. b.i.d.  7. Potassium chloride 40 mEq p.o. daily.  8. Lisinopril 10 mg p.o. daily.  9. Lasix 40 mg p.o. daily.  10.      Ultram 50 mg one to two p.o. q.6h. PRN pain.   ACTIVITY:  The patient should not do any heavy lifting, pushing, pulling  anything greater than 10  pounds.  He is to continue working with physical  therapy and occupational therapy as per their recommendations.   DIET:  Heart-healthy diet.   WOUND CARE:  The patient may shower daily.  If his incisions show any sign  of infection or if he has a fever greater than 101 degrees F he is to call  Dr. Sunday Corn office.   FOLLOW UP:  Dr. Riley Kill will follow the patient on the subacute care unit.  CVTS will continue to follow his progress.  He will be scheduled for an  outpatient appointment with Dr. Dorris Fetch prior to discharge from the  hospital.      Toribio Harbour, N.P.                  Salvatore Decent. Dorris Fetch, M.D.    CTK/MEDQ  D:  09/17/2003  T:  09/17/2003  Job:  [id number]   cc:   Dani Gobble, MD  Fax: [phone]   Ranelle Oyster, M.D.  [address] [place]  [place] [address]  Fax: [phone]   [facility] Teaching Service, [facility]

## 2011-01-29 NOTE — H&P (Signed)
NAMEJOHNTE, PORTNOY NO.:  [id number]   MEDICAL RECORD NO.:  [id number]          PATIENT TYPE:  AMB   LOCATION:                                FACILITY:  [facility]   PHYSICIAN:  Dalia Heading, M.D.  DATE OF BIRTH:  13-Feb-1941   DATE OF ADMISSION:  11/15/2006  DATE OF DISCHARGE:  LH                              HISTORY & PHYSICAL   .   CHIEF COMPLAINT:  History of colon carcinoma.   HISTORY OF PRESENT ILLNESS:  The patient is a 70 year old white male who  is referred for endoscopic evaluation.  He needs colonoscopy for  followup of colon carcinoma.  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  No abdominal pain, weight loss,  nausea, vomiting, diarrhea, constipation, melena, hematochezia have been  noted.  There is no family history of colon carcinoma.   PAST MEDICAL HISTORY:  Includes coronary artery disease, hypertension,  CVA.   PAST SURGICAL HISTORY:  Includes CABG, herniorrhaphy, left  hemicolectomy.   CURRENT MEDICATIONS:  Include senna, Maxzide, Plavix which she is  holding, simvastatin, Vicodin, metoprolol, lisinopril, potassium  supplements, baby aspirin which she is holding, Lexapro.   ALLERGIES:  No known drug allergies.   REVIEW OF SYSTEMS:  No recent MI, chest pain, CVA, or diabetes mellitus  is noted.   PHYSICAL EXAMINATION:  GENERAL:  The patient is white male in no acute  distress.  LUNGS:  Clear to auscultation with equal breath sounds bilaterally.  HEART: Examination reveals regular rate and rhythm without S3, S4, or  murmurs.  ABDOMEN:  Soft, nontender, nondistended.  No hepatosplenomegaly or  masses noted.  A reducible umbilical hernia is present.  RECTAL:  Examination was deferred to the procedure.   IMPRESSION:  Colon carcinoma.   PLAN:  The patient is scheduled for colonoscopy on November 15, 2006.  The  risks and benefits of the  procedure including bleeding and perforation  were fully explained to the patient, gave informed consent.      Dalia Heading, M.D.  Electronically Signed     MAJ/MEDQ  D:  11/01/2006  T:  11/01/2006  Job:  [id number]   cc:   [facility]  Fax: [phone]   Milus Mallick. Lodema Hong, M.D.  Fax: [phone]

## 2011-01-29 NOTE — Op Note (Signed)
Craig Fields, Craig Fields NO.:  [id number]   MEDICAL RECORD NO.:  [id number]          PATIENT TYPE:  INP   LOCATION:  IC06                          FACILITY:  [facility]   PHYSICIAN:  Dalia Heading, M.D.  DATE OF BIRTH:  [birth year]   DATE OF PROCEDURE:  11/01/2005  DATE OF DISCHARGE:                                 OPERATIVE REPORT   PREOPERATIVE DIAGNOSIS:  Colon carcinoma.   POSTOPERATIVE DIAGNOSIS:  Colon carcinoma.   PROCEDURE:  Sigmoid colectomy, colonoscopy for localization of tumor.   SURGEON:  Dr. Franky Macho.   ASSISTANT:  Dr. Arna Snipe.   ANESTHESIA:  General endotracheal.   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 [name] preoperative CEA was 1.3. The patient now comes the  operating room for a partial colectomy. He will also undergo colonoscopy  intraoperatively to localize the tumor. The risks and benefits of procedure  including bleeding, infection, cardiopulmonary difficulties, and the  possibility of a blood transfusion were fully explained to the patient, who  gave informed consent.   PROCEDURE NOTE:  The patient was placed in a supine position. After  induction of general endotracheal anesthesia, the colonoscopy was performed.  The colonoscope was advanced to the distal transverse colon. As it was being  retracted, the lesion in question was found. This was at 40 cm from the  anus. Methylene blue was then injected submucosally into this region in  order to facilitate localization of the tumor given the patient's morbid  obesity. All air was then evacuated from the sigmoid colon and rectum upon  retraction of the colonoscope. The abdomen was then prepped and draped using  the usual sterile technique with Betadine. Surgical site confirmation was  performed.   A midline incision was made from just above the umbilicus  to the suprapubic  region. The peritoneal cavity was entered into without difficulty. On  exploration of the abdomen, the nasogastric tube tip was noted be in its  appropriate position in the stomach. The left lobe of liver was within  normal limits. No mass lesions were noted. The right lobe of liver could not  be fully palpated due to previous surgery and adhesions. The ascending  colon, transverse colon, and descending colon regions were all within normal  limits. No abnormal lesions were noted. The methylene blue dye was noted to  be in the midsigmoid colon region. It was elected to proceed with a partial  colectomy of the sigmoid colon. A GIA stapler was placed proximally and  distally on the sigmoid colon and fired. The mesentery was then divided  using the harmonic scalpel. A suture was placed proximally on the sigmoid  colon specimen, and the specimen was removed and sent to pathology for  further examination. A side-to-side descending colon to distal sigmoid colon  anastomosis was then performed using a GIA stapler. The colotomy was closed  using a TA-60 stapler. The staple line was bolstered using 3-0 silk sutures.  Omentum was then placed over the anastomosis and secured using 3-0 silk  sutures. The mesenteric defect was closed using a 0 chromic gut interrupted  suture. The abdominal cavity then copiously irrigated with normal saline.  The fascia was reapproximated using a looped 0 Novofil running suture.  Subcutaneous layer was irrigated with normal saline, and skin was closed  using staples. Betadine ointment and dry sterile dressing were applied.   All tape and needle counts were correct at the end of the procedure. The  patient was extubated in the operating room and went back to recovery room  awake in stable condition.   COMPLICATIONS:  None.   SPECIMEN:   SIGMOID COLON SUTURED PROXIMAL:  Blood loss less than 100 cc.      Dalia Heading, M.D.  Electronically  Signed     MAJ/MEDQ  D:  11/01/2005  T:  11/01/2005  Job:  [id number]   cc:   Dani Gobble, MD  Fax: [phone]   Milus Mallick. Lodema Hong, M.D.  Fax: [phone]

## 2011-01-29 NOTE — Assessment & Plan Note (Signed)
Craig Fields is here regarding his spastic left hemiparesis.  I last saw him in  February of this year.  We titrated his Dantrium up to 100 mg q.i.d. He  feels that he has had sporadic, at best, results with the Dantrium.  He is  interested in pursuing Botox once again but is waiting for Medicare and  insurance issue to be settled.  The patient is walking with his hinged AFO.  He denies any falls or pain.  The AFO is looser now as he has lost some  weight since his heart surgery.  He denies any focal pain.  He does have  occasional spasms.  He has been involved with cardiac rehab and gaining a  little bit more endurance overall.   REVIEW OF SYMPTOMS:  The patient denies any chest pain, shortness of breath,  cold, flu, wheezing or coughing symptoms.  Denies new weakness, numbness,  dizziness, vertigo, confusion, problems with sleep, suicidal thoughts,  hearing, taste or headaches.  Denies nausea, vomiting, urinary retention,  urinary frequency, constipation, or bladder incontinence.  The patient  denies fever, chills, weight change, skin breakdown or bruising.   PHYSICAL EXAMINATION:  VITAL SIGNS:  Blood pressure is 116/54, pulse 62,  respiratory rate 20, saturating 97% on room air.  NEUROLOGIC:  The patient has stable tone left upper extremity, 2+ to 3/4 at  the left wrist and fingers, 2/4 at the left triceps.  Knee and ankle tone is  1+/4.  Motor examination still in the range of 2 to 2+/5 in the left upper  extremity and 3+/5 left lower extremity.  On standing, the patient has  continued external rotation and varus moment at the left knee.  The left  foot is inverted predominantly.  She was worn at the toe to a certain extent  on the lateral edges.  Skin is intact throughout.  _________is appropriate.  Cranial nerve examination is unchanged.  I witnessed the patient walk today  and the  hinged AFO seems to provide better clearance of the toes but he  does not have significant toe lift when he  comes out of stance phase of gait  on the left side.   ASSESSMENT:  1. 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  2. Hypertension.  3. Constipation.   PLAN:  1. I would like to consider follow-up Botox injections in about three months     from now, trying to aim predominantly for the left wrist and finger     flexors using 200 to 300 units.  Also could potentially target the left     triceps with 100 to 200 units of Botox.  2. Recommend the patient follow up with advanced orthotics regarding further     shoe and brace modification and resizing.  3. The patient will continue with Senokot for constipation as well as Plavix     for stroke prophylaxis.  4. Will titrate the Dantrium to off over the next three to four weeks.  5. I will see the patient back in approximately three to four months' time.      Ranelle Oyster, M.D.   ZTS/MedQ  D:  02/26/2004 12:20:47  T:  02/26/2004 13:17:44  Job #:  [id number]   cc:   Craig Fields, M.D.

## 2011-01-29 NOTE — Assessment & Plan Note (Signed)
Craig Fields is back regarding his left-sided spastic hemiparesis.  He had good  results with the phenol injection, was making progress with therapy, but  developed complications with a colon polyp, which turned out to be cancerous  and required surgery.  He had a colectomy of approximately 1 ft on November 29, 2005.  He is recovering from that and generally improving nicely.  He has  had some setback with the use of his left arm and leg as a result.  He is  feeling pretty well in general right now and denies any pain but still has  some tightness in the left wrist and fingers as well as the knee.  He walks  with a quad cane for balance.  He recalls no problems on his last visit with  the phenol injection from the standpoint of pain, numbness, etc.   REVIEW OF SYSTEMS:  Patient reports no new neurological, psychiatric,  constitutional, GI/GU, or cardiorespiratory complaints other than those  mentioned above.   SOCIAL HISTORY:  Wife remains very supportive.   PHYSICAL EXAMINATION:  VITAL SIGNS:  Blood pressure 149/68, pulse 61,  respirations 17, satting 100% on room air.  GENERAL:  Patient is pleasant in no acute distress.  He is alert and  oriented x3.  HEART:  Regular rate and rhythm.  LUNGS:  Clear.  ABDOMEN:  Soft and nontender.  EXTREMITIES:  NEUROLOGIC:  He is alert and appropriate.  Coordination is normal on the  right.  It is impaired on the left due to his tone.  Reflexes are 3+ on the  left side.  He has 2-3+/4 tone in the left wrist and fingers, 2/4 tone in  the left hamstrings.  He had trace to 1 tone in the left ankle.  Cognitively, the patient is appropriate.  Sensation is decreased somewhat on  the left side at 102.  Left elbow and wrist function is trace to +1/5.  In  general, left lower extremity movement is 2/5.  Cranial nerves exam is  unremarkable.   ASSESSMENT:  1.  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  2.  Hypertension.  3.  Constipation.   PLAN:  1.  Will set up a phenol injection again of the left median nerve at the      elbow.  Also will attempt Botox injection to the left hamstring using      200 units of Botox.  2.  Continue Plavix for stroke prophylaxis.  3.  Consider follow-up therapy.  4.  I will see the patient back pending scheduling of these injections.      Ranelle Oyster, M.D.  Electronically Signed     ZTS/MedQ  D:  12/27/2005 12:29:21  T:  12/27/2005 21:25:44  Job #:  [id number]

## 2011-01-29 NOTE — Procedures (Signed)
NAMEJEVAN, GAUNT NO.:  [id number]   MEDICAL RECORD NO.:  [id number]          PATIENT TYPE:  REC   LOCATION:  TPC                          FACILITY:  [facility]   PHYSICIAN:  Ranelle Oyster, M.D.DATE OF BIRTH:  [birth year]   DATE OF PROCEDURE:  DATE OF DISCHARGE:                                 OPERATIVE REPORT   PROCEDURE PERFORMED:  Botox injection.  Diagnostic code is 342.12.   DIAGNOSIS:  Spastic left hemiparesis non-dominant limb.   DESCRIPTION OF PROCEDURE:  After informed consent and preparation of the  skin with isopropyl alcohol, we injected the quadriceps muscle at four  separate access points using 200 units of botulinum toxin type A.  Each 100  units of Botox was diluted in 1.5 mL preservative free normal saline.  Aspiration technique was used.  We also utilized EMG needle guidance for  appropriate location of the medicine.  The patient tolerated the injection  without difficulties.  He was given postinjection instructions.   I will have the patient follow up with me for left phenol injection as an  appointment is available.  We will inject the left median nerve at the  elbow.      Ranelle Oyster, M.D.  Electronically Signed     ZTS/MEDQ  D:  01/24/2006 15:36:42  T:  01/25/2006 08:47:42  Job:  [id number]

## 2011-01-29 NOTE — Op Note (Signed)
Craig Fields, PULSE NO.:  [id number]   MEDICAL RECORD NO.:  [id number]                   PATIENT TYPE:  INP   LOCATION:  2309                                 FACILITY:  [facility]   PHYSICIAN:  Salvatore Decent. Dorris Fetch, M.D.         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   POSTOPERATIVE DIAGNOSES:  Severe three-vessel coronary disease with unstable  angina.   OPERATION PERFORMED:  Median sternotomy, extracorporeal circulation,  coronary artery bypass grafting times four (left internal mammary artery to  left anterior descending, saphenous vein graft to ramus intermedius,  saphenous vein graft to obtuse marginal 2, saphenous vein graft to posterior  descending), endoscopic vein harvest, right let.   SURGEON:  Salvatore Decent. Dorris Fetch, M.D.   ASSISTANT:  Coral Ceo, P.A.   ANESTHESIA:  General.   FINDINGS:  Pericarditis with fibrous peel obscuring posterolateral walls.  Good quality targets, good quality conduits, left ventricular hypertrophy.   INDICATIONS FOR PROCEDURE:  The patient is a 70 year old gentleman with a  history of multiple cardiac risk factors as well as a previous stroke with  some residual left-sided weakness.  He presented with unstable angina and at  cardiac catheterization had severe three-vessel disease but preserved left  ventricular function.  The patient was referred for coronary artery bypass  grafting.  He had been on Plavix prior to admission and therefore was  advised to wait off Plavix five to seven days  prior to surgery.  The  indications, risks, benefits and alternatives including the high risk nature  of the procedure given his previous stroke were discussed in detail with the  patient and his family and he understood and accepted the risks and  agreed  to proceed.   DESCRIPTION OF PROCEDURE:  Craig Fields was brought to the preop holding area  on September 10, 2003.  Lines were placed to monitor arterial, central venous  and pulmonary arterial pressure.  EKG leads were placed for continuous  telemetry.  Intravenous antibiotics were administered.  The patient was  taken to the operating room, anesthetized and intubated.  A Foley catheter  was placed.  The chest, abdomen and legs were prepped and draped in the  usual fashion.   A median sternotomy was performed.  The left internal mammary artery was  harvested using standard technique.  Simultaneously incision was made in the  medial aspect of the right leg at the level of the knee and the greater  saphenous vein was harvested endoscopically from both the thigh as well as  the upper calf.  The saphenous vein was of good quality.  The patient was  fully heparinized prior to dividing the distal end of the mammary artery.  There was excellent flow through the cut  ends of the vessel.   The pericardium was opened.  The ascending aorta was inspected and palpated.  There was no palpable atherosclerotic disease.  The aorta was cannulated via  concentric 2-0 Ethibond pledgeted pursestring sutures.  A dual stage venous  cannula was placed via pursestring suture in the right atrial appendage.  Cardiopulmonary bypass was instituted and the patient was cooled to 32  degrees Celsius.  The coronary arteries were inspected and anastomotic sites  were chosen.  Of note there was pericardial inflammation as well as  epicardial fibrous peel particularly on the inferior and posterior walls as  well as on the lateral wall.  Coronary arteries were inspected and suitable  anastomotic sites were chosen on each of the vessels.  The conduits were  inspected and cut to length.  A foam pad was placed in the pericardium.  A  temperature probe was placed in the myocardial septum and a cardioplegia  cannula was  placed in the ascending aorta.   The aorta was crossclamped.  The left ventricle was emptied via the aortic  root vent.  Cardiac arrest then was achieved with a combination of cold  antegrade blood cardioplegia and topical iced saline.  After achieving a  complete diastolic arrest and myocardial septal temperature of 12 degrees  Celsius, the following distal anastomoses were performed.   First reversed saphenous vein graft was placed end-to-side to the posterior  descending branch of the right coronary artery.  This was a 2 mm vessel.  It  was a good quality at the site of the anastomosis.  The vein graft was good  quality.  The anastomosis was performed with a running 7-0 Prolene suture in  end-to-side fashion.  There was excellent flow through the graft.  Cardioplegia was administered.  There was good hemostasis.  Next a reversed  saphenous vein graft was placed end-to-side to the second obtuse marginal  branch of the left circumflex coronary artery.  This was the large dominant  lateral wall branch.  It was 1.5 mm in diameter.  A probe passed easily  distally.  There was significant disease proximal to the anastomosis.  The  vein graft was spatulated and was anastomosed end-to-side with a running 7-0  Prolene suture.  Of note the obtuse marginal 2 as well as the ramus  intermedius were intramyocardial vessels.  Next, a reversed saphenous vein  graft was placed end-to-side to the ramus intermedius.  This was a 1.5 mm  good quality target.  It was again intramyocardial.  A 1.5 mm probe passed  for a short distance distally and then a 1 mm probe passed further down the  branches of the vessel.  The vein graft for the ramus was of smaller caliber  but still good quality.  The anastomosis was then performed with a running 7-  0 Prolene suture.  Again, there was good flow through the graft. Cardioplegia was administered.  There was good hemostasis.   Next, the left internal mammary artery  was brought through a window in the  pericardium.  The distal end of the mammary was spatulated, then was  anastomosed end-to-side to the distal LAD.  The LAD was diffusely diseased  at its midportion. It was grafted beyond the palpable plaque. The 1.5 mm  probe did pass easily retrograde.  A 1 mm probe passed distal to the apex.  The mammary was a 2.5 mm good quality conduit.  The LAD was a 1.5 mm target.  The anastomosis  was performed with a running 8-0 Prolene suture in an end-to-  side fashion.  At the completion of the mammary anastomosis the bulldog  clamp was briefly removed to inspect for hemostasis.  Immediate and rapid  septal rewarming was noted.  A single 8-0 Prolene suture was required for a  small leak at the heel of the anastomosis.  There then was good hemostasis.  The mammary pedicle was tacked to the epicardial surface of the heart with 6-  0 Prolene sutures.   Additional cardioplegia then was administered down the vein grafts.  They  were cut to length.  Additional cardioplegia was also administered down the  aortic root to achieve adequate septal cooling.  The cardioplegia cannula  then was removed from the ascending aorta and the proximal vein graft  anastomoses were performed to 4.0 mm punch aortotomies while under cross-  clamp.  These were performed with running 6-0 Prolene sutures.  At the  completion of the final proximal anastomosis, lidocaine was administered.  The bulldog clamp was again removed from the mammary artery.  Immediate and  rapid septal rewarming was again noted.  The patient was placed in  Trendelenburg position.  The aortic root was deaired and the aortic  crossclamp was removed.  The vein grafts were aspirated to remove any  entrapped air.  The bulldog clamps were then removed from them and flow was  restored to the entire heart.  The total crossclamp time was 89 minutes.  All proximal and distal anastomoses were inspected for hemostasis while  the  patient was being rewarmed.  Epicardial pacing wires were placed on the  right ventricle and right atrium.  A dopamine drip was initiated and when  the patient had been rewarmed to a core temperature of 37 degrees Celsius,  he was weaned from cardiopulmonary bypass without difficulty.  He weaned on  the first attempt and had cardiac index of greater than 2L a minute per  meter squared coming off bypass.  The total bypass time was 136 minutes.  The test dose of protamine was administered and was well tolerated.  The  atrial and aortic cannulae were removed.  The remainder of the protamine was  administered without incident.  The chest was irrigated with 1L of warm  normal saline containing 1 gm of vancomycin.  Hemostasis was achieved.  A  left pleural and two mediastinal chest tubes were placed through separate subcostal incisions.  The pericardium was closed with interrupted 3-0 silk  sutures.  It came together easily without tension.  The sternum was closed  with a combination of heavy gauge stainless steel wires as well as sternal  bands for the midportion of the sternum to provide extra stability.  The  remainder of the incisions were closed in standard fashion.  Subcuticular  closures were used for the skin.  All sponge, needle and instrument counts  were correct at the end of the procedure.  Craig Fields was taken from the  operating room to the surgical intensive care unit intubated in critical but  stable condition.                                               Salvatore Decent Dorris Fetch, M.D.    SCH/MEDQ  D:  09/10/2003  T:  09/10/2003  Job:  [id number]   cc:   Vonna Kotyk  Tomasita Crumble, M.D.  [phone]  [place]  [place] [address]  Fax: [phone]   Madaline Guthrie, M.D.  [address] [place] [address]  [place] [address]  Fax: [phone]

## 2011-01-29 NOTE — Assessment & Plan Note (Signed)
SUBJECTIVE:  Craig Fields is back regarding his spastic left hemiparesis.  Craig Fields  does not feel that he has had as many results with the increase in the  Dantrium that he did with the initial dose of the medication.  Overall he  feels like he is better from the summer from a tone standpoint.  He still  struggles with his bracewear.  He has had less than remarkable results with  the Botox in the past and therefore, we have not proceeded with future Botox  injections.   On review of systems patient denies any sleep issues.  Mood has been good.  He is ambulating daily.  No chest pain or shortness of breath.  No bladder  problems.  He has had more constipation and is using two Senokot daily to  control his schedule.   OBJECTIVE:  On physical examination today patient is pleasant; no acute  distress.  Blood pressure is 164/89, pulse 64, saturating 97% on room air.  His left upper extremity still has significant tone at 2 to 3/4 depending on  location.  He seems to be tightest at the finger flexors today.  He is 1+ to  2 at the knee and ankle today on examination and seems to be a little bit  more fluent with his gait overall.  Motor exam still in the range of 2 to  2+/5 in the upper and 3+/5 in the left lower extremity.  Skin is intact.  Cognition is appropriate.  Cranial nerve exam is unchanged.   ASSESSMENT:  1. 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  2. Hypertension.  3. Constipation.   PLAN:  1. We will continue titrating his Dantrium upwards, after 10 days he will be     at 100 mg t.i.d.  2. We will check complete metabolic panel today and CBC.  3. Continue with the Senokot for constipation.  I gave him samples today.  4. I gave him samples of Plavix today which he will continue to take for     stroke prophylaxis.  5. I will see him back in approximately 2 months' time.      Ranelle Oyster, M.D.   ZTS/MedQ  D:  08/30/2003 10:17:17  T:  08/30/2003 11:26:10   Job #:  [id number]   cc:   Wendi Maya, MD

## 2011-01-29 NOTE — Discharge Summary (Signed)
NAME:  Craig Fields, Craig Fields NO.:  [id number]   MEDICAL RECORD NO.:  [id number]          PATIENT TYPE:  INP   LOCATION:  A223                          FACILITY:  [facility]   PHYSICIAN:  Dalia Heading, M.D.  DATE OF BIRTH:  Jun 24, 1941   DATE OF ADMISSION:  11/01/2005  DATE OF DISCHARGE:  02/26/2007LH                                 DISCHARGE SUMMARY   HOSPITAL COURSE SUMMARY:  The patient is a 70 year old white male with  multiple medical problems including coronary artery disease, hypertension,  and a stroke in the past who was found on colonoscopy to have invasive  carcinoma of the sigmoid colon. He went to the operating room on November 01, 2005 and underwent a sigmoid colectomy. He tolerated the procedure well.  His postoperative course is remarkable for a mild postoperative ileus which  has since resolved. Physical therapy was consulted for ambulation due to  residual effects of a previous stroke. Final pathology was negative for any  retained malignancy. His hypokalemia has been corrected.   The patient is being discharged home in good improving condition.   DISCHARGE INSTRUCTIONS:  The patient is to follow up with Dr. Franky Macho  on November 11, 2005. Discharge medications include K-Dur 20 mEq p.o. twice a  day x2 weeks. He is to resume all his other medications as previously  prescribed.   PRINCIPAL DIAGNOSIS:  1.  Colon carcinoma.  2.  Coronary artery disease.  3.  Hypertension.  4.  History of cerebrovascular accident.   PRINCIPAL PROCEDURE:  Sigmoid colectomy on November 01, 2005.      Dalia Heading, M.D.  Electronically Signed     MAJ/MEDQ  D:  11/08/2005  T:  11/08/2005  Job:  [id number]   cc:   Milus Mallick. Lodema Hong, M.D.  Fax: [phone]   Dani Gobble, MD  Fax: [phone]

## 2011-01-29 NOTE — Procedures (Signed)
NAMETAJAE, RYBICKI NO.:  [id number]   MEDICAL RECORD NO.:  [id number]          PATIENT TYPE:  REC   LOCATION:  TPC                          FACILITY:  [facility]   PHYSICIAN:  Ranelle Oyster, M.D.DATE OF BIRTH:  11/04/1940   DATE OF PROCEDURE:  10/05/2005  DATE OF DISCHARGE:                                 OPERATIVE REPORT   PROCEDURE:  Phenol injection, ICD-9 code 342.12.   DESCRIPTION OF PROCEDURE:  After informed consent and preparation with  isopropyl alcohol, we isolated the median nerve with needle stimulation,  first at 80 mA and titrating gradually down to 0.05 mA.  With a 50 mm 26-  gauge injectable monopolar needle, I injected 5 mL of aqueous phenol  solution around the median nerve.  The patient had relaxation of the median  nerve muscles on the left arm and hand.  The patient tolerated the injection  well without any bleeding complications.   I will send the patient for outpatient occupational therapy to work on arm  and hand range of motion.  Hopefully, we can achieve more splint wear as  well.  The patient was given post-injection instructions.  I will see him  back in approximately two months' time.      Ranelle Oyster, M.D.  Electronically Signed     ZTS/MEDQ  D:  10/05/2005 13:39:29  T:  10/06/2005 06:02:56  Job:  [id number]

## 2011-02-01 ENCOUNTER — Encounter: Payer: Self-pay | Admitting: Family Medicine

## 2011-03-02 ENCOUNTER — Encounter: Payer: Self-pay | Admitting: Family Medicine

## 2011-03-03 ENCOUNTER — Ambulatory Visit (INDEPENDENT_AMBULATORY_CARE_PROVIDER_SITE_OTHER): Payer: Medicare Other | Admitting: Family Medicine

## 2011-03-03 ENCOUNTER — Encounter: Payer: Self-pay | Admitting: Family Medicine

## 2011-03-03 VITALS — BP 118/78 | HR 84 | Resp 16 | Ht 68.75 in | Wt 262.4 lb

## 2011-03-03 DIAGNOSIS — Z125 Encounter for screening for malignant neoplasm of prostate: Secondary | ICD-10-CM

## 2011-03-03 DIAGNOSIS — R7302 Impaired glucose tolerance (oral): Secondary | ICD-10-CM

## 2011-03-03 DIAGNOSIS — E669 Obesity, unspecified: Secondary | ICD-10-CM

## 2011-03-03 DIAGNOSIS — R5383 Other fatigue: Secondary | ICD-10-CM

## 2011-03-03 DIAGNOSIS — E785 Hyperlipidemia, unspecified: Secondary | ICD-10-CM

## 2011-03-03 DIAGNOSIS — R5381 Other malaise: Secondary | ICD-10-CM

## 2011-03-03 DIAGNOSIS — R7309 Other abnormal glucose: Secondary | ICD-10-CM

## 2011-03-03 DIAGNOSIS — R7301 Impaired fasting glucose: Secondary | ICD-10-CM

## 2011-03-03 DIAGNOSIS — I10 Essential (primary) hypertension: Secondary | ICD-10-CM

## 2011-03-03 DIAGNOSIS — C189 Malignant neoplasm of colon, unspecified: Secondary | ICD-10-CM

## 2011-03-03 MED ORDER — HYDROCODONE-ACETAMINOPHEN 5-500 MG PO TABS: 1.0000 | ORAL_TABLET | Freq: Three times a day (TID) | ORAL | Status: DC | PRN

## 2011-03-03 MED ORDER — TRIAMTERENE-HCTZ 37.5-25 MG PO CAPS: 1.0000 | ORAL_CAPSULE | ORAL | Status: DC

## 2011-03-03 NOTE — Patient Instructions (Signed)
F/u in 4 month.  pls increase your fruit and vegetable intake to help with weight loss, you will feel better   I really hope that you will get help  With your leg so can start moving more.  PLS calll and get  An eye exam this is due  Labs today

## 2011-03-03 NOTE — Progress Notes (Signed)
  Subjective:    Patient ID: Craig Fields, male    DOB: [birth year], 70 y.o.   MRN: [id number]  HPI C/o left leg pain which limits his ability to walk, he is more fatigued and tired in recent times, sleep is fair. C/o neck pain and popping radiating down the left arm. He is concerned about weight gain, and plans to reduce his intake to facilitate weight loss.     Review of Systems Denies recent fever or chills. Denies sinus pressure, nasal congestion, ear pain or sore throat. Denies chest congestion, productive cough or wheezing. Denies chest pains, palpitations, paroxysmal nocturnal dyspnea, orthopnea and leg swelling Denies abdominal pain, nausea, vomiting,diarrhea or constipation.  Denies rectal bleeding or change in bowel movement. Denies dysuria, frequency, hesitancy or incontinence.  Denies headaches, seizure, numbness, or tingling. Denies depression, anxiety or insomnia. Denies skin break down or rash.        Objective:   Physical Exam Patient alert and oriented and in no Cardiopulmonary distress.  HEENT: No facial asymmetry, EOMI, no sinus tenderness, TM's clear, Oropharynx pink and moist.  Neck supple no adenopathy.  Chest: Clear to auscultation bilaterally.  CVS: S1, S2 no murmurs, no S3.  ABD: Soft non tender. Bowel sounds normal.  Ext: No edema  MS: decreased  ROM spine, hips and knees.  Skin: Intact, no ulcerations or rash noted.  Psych: Good eye contact, normal affect. Memory intact not anxious or depressed appearing.  CNS: CN 2-12 intact, power,      Assessment & Plan:

## 2011-03-04 ENCOUNTER — Other Ambulatory Visit: Payer: Self-pay | Admitting: Family Medicine

## 2011-03-04 LAB — HEMOGLOBIN A1C
Hgb A1c MFr Bld: 6.1 % — ABNORMAL HIGH (ref <5.7)
Mean Plasma Glucose: 128 mg/dL — ABNORMAL HIGH (ref <117)

## 2011-03-05 LAB — TSH: TSH: 3.796 u[IU]/mL (ref 0.350–4.500)

## 2011-03-18 NOTE — Assessment & Plan Note (Signed)
Deteriorated. Patient re-educated about  the importance of commitment to a  minimum of 150 minutes of exercise per week. The importance of healthy food choices with portion control discussed. Encouraged to start a food diary, count calories and to consider  joining a support group. Sample diet sheets offered. Goals set by the patient for the next several months.    

## 2011-03-18 NOTE — Assessment & Plan Note (Signed)
Controlled, no change in medication  

## 2011-03-18 NOTE — Assessment & Plan Note (Signed)
rept labs to be followed

## 2011-04-26 ENCOUNTER — Ambulatory Visit (INDEPENDENT_AMBULATORY_CARE_PROVIDER_SITE_OTHER): Payer: Medicare Other | Admitting: Family Medicine

## 2011-04-26 ENCOUNTER — Encounter: Payer: Self-pay | Admitting: Family Medicine

## 2011-04-26 VITALS — BP 138/78 | HR 73 | Resp 16 | Wt 255.1 lb

## 2011-04-26 DIAGNOSIS — R21 Rash and other nonspecific skin eruption: Secondary | ICD-10-CM

## 2011-04-26 MED ORDER — HYDROCORTISONE 1 % EX CREA: TOPICAL_CREAM | CUTANEOUS | Status: AC

## 2011-04-26 MED ORDER — CEPHALEXIN 500 MG PO CAPS: 500.0000 mg | ORAL_CAPSULE | Freq: Two times a day (BID) | ORAL | Status: AC

## 2011-04-26 NOTE — Patient Instructions (Signed)
Start the antibiotic, 1 tablet twice a day for 7 days Use the cream for any itcy or irritating spots, try not to scratch If you have fever, vomiting or the rash spreads please come in for a visit or go to ER Please call on Thursday to check on progress Make sure you elevate your legs to help with the swelling

## 2011-04-26 NOTE — Progress Notes (Signed)
  Subjective:    Patient ID: Craig Fields, male    DOB: [birth year], 70 y.o.   MRN: [id number]  HPI Patient presents with rash on right lower extremity x1 week. He noticed a few red spots on his leg which were sore to touch earlier in the week. He does go outside but does not remember any particular bite or injury. He's noticed that the spots are spreading on his lower extremities also had some swelling. He denies any drainage from any lesions. He denies fever or chills. He does note that yesterday he had some nausea. Has not tried any medication on the lesions. One lesion he has scratched the skin off. There has been no recent change in his medications. He has been tapered off of Neurontin and Zanaflex   Review of Systems per above     Objective:   Physical Exam  GEN- NAD, alert and oriented  EXT- Brace on LLE    1+ edema RLE, mild edema LLE  Skin- multiple areas of erythema with induration on 2 lesions on medial aspect of right leg, excoriations on lateral aspect of leg, with scabbing over one lesion, all together 4-5 areas of erythema  Feet- no lesions Pulse 2+ radial and DP       Assessment & Plan:   RASH- unclear cause of her rash. As there are couple lesions with induration light there may have been a bite I will go ahead and cover him for staph with cephalexin. I will also give hydrocortisone for any itching areas. If this is not resolved our recommended a biopsy of the lesions. We will followup on Thursday via telephone to see if these are improving. He will elevate his foot for the swelling.

## 2011-04-29 ENCOUNTER — Telehealth: Payer: Self-pay | Admitting: Family Medicine

## 2011-04-29 NOTE — Telephone Encounter (Signed)
Noted  

## 2011-05-28 ENCOUNTER — Other Ambulatory Visit: Payer: Self-pay

## 2011-05-28 MED ORDER — HYDROCODONE-ACETAMINOPHEN 5-500 MG PO TABS: 1.0000 | ORAL_TABLET | Freq: Three times a day (TID) | ORAL | Status: DC | PRN

## 2011-06-03 LAB — CBC
Hemoglobin: 14.7
MCHC: 33.1
MCV: 91.2
RBC: 4.88
RDW: 14.2

## 2011-06-03 LAB — BASIC METABOLIC PANEL
CO2: 29
Chloride: 103
Creatinine, Ser: 0.64
GFR calc Af Amer: >60
Glucose, Bld: 115 — ABNORMAL HIGH

## 2011-06-03 LAB — DIFFERENTIAL
Basophils Relative: 0
Eosinophils Absolute: 0.2
Monocytes Absolute: 0.5
Monocytes Relative: 7
Neutrophils Relative %: 72

## 2011-06-07 ENCOUNTER — Telehealth: Payer: Self-pay | Admitting: Family Medicine

## 2011-06-07 MED ORDER — HYDROCODONE-ACETAMINOPHEN 5-500 MG PO TABS: 1.0000 | ORAL_TABLET | Freq: Three times a day (TID) | ORAL | Status: DC | PRN

## 2011-06-07 NOTE — Telephone Encounter (Signed)
Patient aware, med sent in

## 2011-06-14 ENCOUNTER — Other Ambulatory Visit: Payer: Self-pay | Admitting: Family Medicine

## 2011-07-01 ENCOUNTER — Encounter: Payer: Self-pay | Admitting: Family Medicine

## 2011-07-02 LAB — HEPATIC FUNCTION PANEL
AST: 31 U/L (ref 0–37)
Alkaline Phosphatase: 38 U/L — ABNORMAL LOW (ref 39–117)
Indirect Bilirubin: 0.5 mg/dL (ref 0.0–0.9)
Total Protein: 6.9 g/dL (ref 6.0–8.3)

## 2011-07-02 LAB — LIPID PANEL
Cholesterol: 154 mg/dL (ref 0–200)
LDL Cholesterol: 87 mg/dL (ref 0–99)
Triglycerides: 150 mg/dL — ABNORMAL HIGH (ref <150)

## 2011-07-02 LAB — BASIC METABOLIC PANEL
BUN: 24 mg/dL — ABNORMAL HIGH (ref 6–23)
Calcium: 9.7 mg/dL (ref 8.4–10.5)
Creat: 1.02 mg/dL (ref 0.50–1.35)

## 2011-07-06 ENCOUNTER — Ambulatory Visit (INDEPENDENT_AMBULATORY_CARE_PROVIDER_SITE_OTHER): Payer: Medicare Other | Admitting: Family Medicine

## 2011-07-06 ENCOUNTER — Encounter: Payer: Self-pay | Admitting: Family Medicine

## 2011-07-06 VITALS — BP 140/68 | HR 68 | Resp 16 | Ht 68.75 in | Wt 255.4 lb

## 2011-07-06 DIAGNOSIS — Z23 Encounter for immunization: Secondary | ICD-10-CM

## 2011-07-06 DIAGNOSIS — K5900 Constipation, unspecified: Secondary | ICD-10-CM | POA: Insufficient documentation

## 2011-07-06 DIAGNOSIS — R7309 Other abnormal glucose: Secondary | ICD-10-CM

## 2011-07-06 DIAGNOSIS — I69959 Hemiplegia and hemiparesis following unspecified cerebrovascular disease affecting unspecified side: Secondary | ICD-10-CM

## 2011-07-06 DIAGNOSIS — E785 Hyperlipidemia, unspecified: Secondary | ICD-10-CM

## 2011-07-06 DIAGNOSIS — I10 Essential (primary) hypertension: Secondary | ICD-10-CM

## 2011-07-06 DIAGNOSIS — M549 Dorsalgia, unspecified: Secondary | ICD-10-CM

## 2011-07-06 DIAGNOSIS — R7303 Prediabetes: Secondary | ICD-10-CM

## 2011-07-06 DIAGNOSIS — E669 Obesity, unspecified: Secondary | ICD-10-CM

## 2011-07-06 MED ORDER — CLOPIDOGREL BISULFATE 75 MG PO TABS: 75.0000 mg | ORAL_TABLET | Freq: Every day | ORAL | Status: DC

## 2011-07-06 MED ORDER — POLYETHYLENE GLYCOL 3350 17 GM/SCOOP PO POWD: 17.0000 g | Freq: Every day | ORAL | Status: DC

## 2011-07-06 NOTE — Patient Instructions (Addendum)
F/u in 4.5 months.  The skin lesions on your legs are no longer infected, pls do not put on any cream on your skin lesions.  Blood sugar is improved   Pls cut back on fried and fatty foods   Use miralax every day and stool softeners every day for help with your constipation.  Ducolax, milk of magnesia or magnesium citrate are laxatives that you can use every 3 days if no bowel movement. Please increase fruit and vegetable intake  Flu vaccine today

## 2011-07-19 DIAGNOSIS — R7303 Prediabetes: Secondary | ICD-10-CM | POA: Insufficient documentation

## 2011-07-19 NOTE — Progress Notes (Signed)
  Subjective:    Patient ID: Craig Fields, male    DOB: 1941-02-23, 70 y.o.   MRN: [id number]  HPI The PT is here for follow up and re-evaluation of chronic medical conditions, medication management and review of any available recent lab and radiology data.  Preventive health is updated, specifically  Cancer screening and Immunization.   Questions or concerns regarding consultations or procedures which the PT has had in the interim are  addressed. The PT denies any adverse reactions to current medications since the last visit.  Has insect bites on his leg which he is concerned are not healing and look infected, he has continued to use topical antibiotic. Denies fever, tenderness or drainage       Review of Systems See HPI Denies recent fever or chills. Denies sinus pressure, nasal congestion, ear pain or sore throat. Denies chest congestion, productive cough or wheezing. Denies chest pains, palpitations and leg swelling Denies abdominal pain, nausea, vomiting,diarrhea . He has constipation at times, but when careful with his diet this is not bad   Denies dysuria, frequency, hesitancy or incontinence. Chronic  joint pain, swelling and limitation in mobility. Denies headaches, seizures, numbness, or tingling. Denies depression, anxiety or insomnia.         Objective:   Physical Exam Patient alert and oriented and in no cardiopulmonary distress.  HEENT: No facial asymmetry, EOMI, no sinus tenderness,  oropharynx pink and moist.  Neck  Adequate ROM though decreased, no adenopathy.  Chest: Clear to auscultation bilaterally.  CVS: S1, S2 no murmurs, no S3.  ABD: Soft non tender. Bowel sounds normal.  Ext: No edema  YQ:[id number]  ROM spine, shoulders, hips and knees.  Skin:NO  rash noted.Healing insect bites noted on leg with no evidence of infection, erythema which is present is due to topical agent being used  Psych: Good eye contact, normal affect. Memory intact not  anxious or depressed appearing.  CNS: CN 2-12 intact, power, tone and sensation normal throughout.        Assessment & Plan:

## 2011-07-19 NOTE — Assessment & Plan Note (Signed)
Unchanged, meds as before 

## 2011-07-19 NOTE — Assessment & Plan Note (Signed)
Unchanged, regular physical activity encouraged

## 2011-07-19 NOTE — Assessment & Plan Note (Signed)
Controlled, no change in medication  

## 2011-07-19 NOTE — Assessment & Plan Note (Signed)
Hyperlipidemia:Low fat diet discussed and encouraged.  No med change at this time     

## 2011-07-19 NOTE — Assessment & Plan Note (Signed)
Improved blood sugars based on recent hBa1C , pt applauded on this and encouraged to continue low carb diet

## 2011-07-19 NOTE — Assessment & Plan Note (Signed)
Deteriorated. Patient re-educated about  the importance of commitment to a  minimum of 150 minutes of exercise per week. The importance of healthy food choices with portion control discussed. Encouraged to start a food diary, count calories and to consider  joining a support group. Sample diet sheets offered. Goals set by the patient for the next several months.    

## 2011-09-11 ENCOUNTER — Other Ambulatory Visit: Payer: Self-pay | Admitting: Family Medicine

## 2011-09-22 DIAGNOSIS — E7800 Pure hypercholesterolemia, unspecified: Secondary | ICD-10-CM | POA: Diagnosis not present

## 2011-09-22 DIAGNOSIS — R0989 Other specified symptoms and signs involving the circulatory and respiratory systems: Secondary | ICD-10-CM | POA: Diagnosis not present

## 2011-09-22 DIAGNOSIS — I10 Essential (primary) hypertension: Secondary | ICD-10-CM | POA: Diagnosis not present

## 2011-09-22 DIAGNOSIS — I2510 Atherosclerotic heart disease of native coronary artery without angina pectoris: Secondary | ICD-10-CM | POA: Diagnosis not present

## 2011-09-22 DIAGNOSIS — Z951 Presence of aortocoronary bypass graft: Secondary | ICD-10-CM | POA: Diagnosis not present

## 2011-10-14 ENCOUNTER — Other Ambulatory Visit: Payer: Self-pay

## 2011-10-14 MED ORDER — HYDROCODONE-ACETAMINOPHEN 5-500 MG PO TABS: 1.0000 | ORAL_TABLET | Freq: Three times a day (TID) | ORAL | Status: DC | PRN

## 2011-10-15 ENCOUNTER — Other Ambulatory Visit: Payer: Self-pay

## 2011-11-03 DIAGNOSIS — I739 Peripheral vascular disease, unspecified: Secondary | ICD-10-CM | POA: Diagnosis not present

## 2011-11-09 ENCOUNTER — Ambulatory Visit: Payer: Medicare Other | Admitting: Family Medicine

## 2011-11-14 ENCOUNTER — Other Ambulatory Visit: Payer: Self-pay | Admitting: Family Medicine

## 2011-11-16 ENCOUNTER — Encounter: Payer: Self-pay | Admitting: Family Medicine

## 2011-11-16 ENCOUNTER — Other Ambulatory Visit: Payer: Self-pay | Admitting: Family Medicine

## 2011-11-16 ENCOUNTER — Ambulatory Visit (INDEPENDENT_AMBULATORY_CARE_PROVIDER_SITE_OTHER): Payer: Medicare Other | Admitting: Family Medicine

## 2011-11-16 VITALS — BP 158/74 | HR 85 | Resp 18 | Ht 68.75 in | Wt 253.0 lb

## 2011-11-16 DIAGNOSIS — R7309 Other abnormal glucose: Secondary | ICD-10-CM | POA: Diagnosis not present

## 2011-11-16 DIAGNOSIS — C189 Malignant neoplasm of colon, unspecified: Secondary | ICD-10-CM | POA: Diagnosis not present

## 2011-11-16 DIAGNOSIS — R7303 Prediabetes: Secondary | ICD-10-CM

## 2011-11-16 DIAGNOSIS — L97919 Non-pressure chronic ulcer of unspecified part of right lower leg with unspecified severity: Secondary | ICD-10-CM

## 2011-11-16 DIAGNOSIS — E785 Hyperlipidemia, unspecified: Secondary | ICD-10-CM

## 2011-11-16 DIAGNOSIS — I2510 Atherosclerotic heart disease of native coronary artery without angina pectoris: Secondary | ICD-10-CM | POA: Diagnosis not present

## 2011-11-16 DIAGNOSIS — L97909 Non-pressure chronic ulcer of unspecified part of unspecified lower leg with unspecified severity: Secondary | ICD-10-CM

## 2011-11-16 DIAGNOSIS — L919 Hypertrophic disorder of the skin, unspecified: Secondary | ICD-10-CM

## 2011-11-16 DIAGNOSIS — Z125 Encounter for screening for malignant neoplasm of prostate: Secondary | ICD-10-CM | POA: Diagnosis not present

## 2011-11-16 DIAGNOSIS — L909 Atrophic disorder of skin, unspecified: Secondary | ICD-10-CM | POA: Diagnosis not present

## 2011-11-16 DIAGNOSIS — L918 Other hypertrophic disorders of the skin: Secondary | ICD-10-CM

## 2011-11-16 DIAGNOSIS — I10 Essential (primary) hypertension: Secondary | ICD-10-CM

## 2011-11-16 DIAGNOSIS — E669 Obesity, unspecified: Secondary | ICD-10-CM

## 2011-11-16 HISTORY — DX: Other hypertrophic disorders of the skin: L91.8

## 2011-11-16 NOTE — Patient Instructions (Signed)
F/u mid July.  Fasting lipid and hepatic, cbc and TSH  In July 1 week before visit  PSA and HBA1C today.  Rectal exam is normal today.  Please try and get the best help for your left foot so you can get around more, call if you want referral to therapy.  Please continue work on weight loss  You are referred to dr Margo Aye

## 2011-11-16 NOTE — Assessment & Plan Note (Signed)
6 month h/o non healing ulcers on right lower leg, thinks initially an insect bite

## 2011-11-17 LAB — POC HEMOCCULT BLD/STL (OFFICE/1-CARD/DIAGNOSTIC): Fecal Occult Blood, POC: NEGATIVE

## 2011-11-20 NOTE — Assessment & Plan Note (Signed)
Controlled, no change in medication  

## 2011-11-20 NOTE — Assessment & Plan Note (Signed)
Hyperlipidemia:Low fat diet discussed and encouraged.  Updated labs prior to next visit 

## 2011-11-20 NOTE — Assessment & Plan Note (Signed)
Unchanged. Patient re-educated about  the importance of commitment to a  minimum of 150 minutes of exercise per week. The importance of healthy food choices with portion control discussed. Encouraged to start a food diary, count calories and to consider  joining a support group. Sample diet sheets offered. Goals set by the patient for the next several months.    

## 2011-11-20 NOTE — Progress Notes (Signed)
  Subjective:    Patient ID: Craig Fields, male    DOB: Oct 03, 1940, 71 y.o.   MRN: [id number]  HPI The PT is here for follow up and re-evaluation of chronic medical conditions, medication management and review of any available recent lab and radiology data.  Preventive health is updated, specifically  Cancer screening and Immunization.   Questions or concerns regarding consultations or procedures which the PT has had in the interim are  addressed. The PT denies any adverse reactions to current medications since the last visit.  Cocerned about 2 areas on the leg (right) and one on anterior chest, where he is cxoncerned about cancer, has had skin cancer in the past.Also wants skin tag on neck removed as it irritates him C/o increased left foot pain with corn limiting mobility Has cut back on portions with weight loss      Review of Systems See HPI Denies recent fever or chills. Denies sinus pressure, nasal congestion, ear pain or sore throat. Denies chest congestion, productive cough or wheezing. Denies chest pains, palpitations and leg swelling Denies abdominal pain, nausea, vomiting,diarrhea or constipation.   Denies dysuria, frequency, hesitancy or incontinence.  Denies headaches, seizures, numbness, or tingling. Denies depression, anxiety or insomnia.       Objective:   Physical Exam Patient alert and oriented and in no cardiopulmonary distress.  HEENT: No facial asymmetry, EOMI, no sinus tenderness,  oropharynx pink and moist.  Neck decreased ROM no adenopathy.  Chest: Clear to auscultation bilaterally.  CVS: S1, S2 no murmurs, no S3.  ABD: Soft non tender. Bowel sounds normal. Rectal: prostate smooth, heme negative stool Ext: No edema  MS: decreased ROM spine, shoulders, hips and knees.  Skin: , scaly erythematous maculopapular lesions on right anterior chest and non healing ulcers on right lower extremity. Skin tag on neck  Psych: Good eye contact, normal affect.  Memory intact not anxious or depressed appearing.  CNS: CN 2-12 intact,        Assessment & Plan:

## 2011-11-20 NOTE — Assessment & Plan Note (Signed)
Deteriorated increased HBA1c to 6.0, re educated about dietary change

## 2011-11-22 DIAGNOSIS — D234 Other benign neoplasm of skin of scalp and neck: Secondary | ICD-10-CM | POA: Diagnosis not present

## 2011-11-22 DIAGNOSIS — I83009 Varicose veins of unspecified lower extremity with ulcer of unspecified site: Secondary | ICD-10-CM | POA: Diagnosis not present

## 2011-12-13 ENCOUNTER — Other Ambulatory Visit: Payer: Self-pay | Admitting: Family Medicine

## 2012-01-12 ENCOUNTER — Other Ambulatory Visit: Payer: Self-pay | Admitting: Family Medicine

## 2012-01-14 ENCOUNTER — Telehealth: Payer: Self-pay | Admitting: Family Medicine

## 2012-01-14 NOTE — Telephone Encounter (Signed)
Sent in

## 2012-01-19 DIAGNOSIS — I739 Peripheral vascular disease, unspecified: Secondary | ICD-10-CM | POA: Diagnosis not present

## 2012-01-25 DIAGNOSIS — I359 Nonrheumatic aortic valve disorder, unspecified: Secondary | ICD-10-CM | POA: Diagnosis not present

## 2012-01-25 DIAGNOSIS — E782 Mixed hyperlipidemia: Secondary | ICD-10-CM | POA: Diagnosis not present

## 2012-01-25 DIAGNOSIS — I2510 Atherosclerotic heart disease of native coronary artery without angina pectoris: Secondary | ICD-10-CM | POA: Diagnosis not present

## 2012-03-10 DIAGNOSIS — I70219 Atherosclerosis of native arteries of extremities with intermittent claudication, unspecified extremity: Secondary | ICD-10-CM | POA: Diagnosis not present

## 2012-03-10 DIAGNOSIS — R0989 Other specified symptoms and signs involving the circulatory and respiratory systems: Secondary | ICD-10-CM | POA: Diagnosis not present

## 2012-03-22 DIAGNOSIS — I10 Essential (primary) hypertension: Secondary | ICD-10-CM | POA: Diagnosis not present

## 2012-03-22 DIAGNOSIS — L919 Hypertrophic disorder of the skin, unspecified: Secondary | ICD-10-CM | POA: Diagnosis not present

## 2012-03-22 DIAGNOSIS — L97909 Non-pressure chronic ulcer of unspecified part of unspecified lower leg with unspecified severity: Secondary | ICD-10-CM | POA: Diagnosis not present

## 2012-03-22 DIAGNOSIS — E785 Hyperlipidemia, unspecified: Secondary | ICD-10-CM | POA: Diagnosis not present

## 2012-03-22 DIAGNOSIS — R7309 Other abnormal glucose: Secondary | ICD-10-CM | POA: Diagnosis not present

## 2012-03-22 DIAGNOSIS — I2510 Atherosclerotic heart disease of native coronary artery without angina pectoris: Secondary | ICD-10-CM | POA: Diagnosis not present

## 2012-03-23 LAB — HEPATIC FUNCTION PANEL
ALT: 17 U/L (ref 0–53)
Total Protein: 6.6 g/dL (ref 6.0–8.3)

## 2012-03-23 LAB — CBC
HCT: 45.9 % (ref 39.0–52.0)
Hemoglobin: 15 g/dL (ref 13.0–17.0)
MCH: 30.1 pg (ref 26.0–34.0)
MCHC: 32.7 g/dL (ref 30.0–36.0)
MCV: 92 fL (ref 78.0–100.0)

## 2012-03-23 LAB — LIPID PANEL
Cholesterol: 148 mg/dL (ref 0–200)
Triglycerides: 128 mg/dL (ref <150)

## 2012-03-29 ENCOUNTER — Ambulatory Visit (INDEPENDENT_AMBULATORY_CARE_PROVIDER_SITE_OTHER): Payer: Medicare Other | Admitting: Family Medicine

## 2012-03-29 ENCOUNTER — Encounter: Payer: Self-pay | Admitting: Family Medicine

## 2012-03-29 VITALS — BP 124/74 | HR 68 | Resp 16 | Ht 68.75 in | Wt 256.0 lb

## 2012-03-29 DIAGNOSIS — B369 Superficial mycosis, unspecified: Secondary | ICD-10-CM | POA: Diagnosis not present

## 2012-03-29 DIAGNOSIS — E785 Hyperlipidemia, unspecified: Secondary | ICD-10-CM

## 2012-03-29 DIAGNOSIS — E669 Obesity, unspecified: Secondary | ICD-10-CM

## 2012-03-29 DIAGNOSIS — R5383 Other fatigue: Secondary | ICD-10-CM

## 2012-03-29 DIAGNOSIS — R7301 Impaired fasting glucose: Secondary | ICD-10-CM | POA: Diagnosis not present

## 2012-03-29 DIAGNOSIS — I10 Essential (primary) hypertension: Secondary | ICD-10-CM | POA: Diagnosis not present

## 2012-03-29 DIAGNOSIS — R5381 Other malaise: Secondary | ICD-10-CM | POA: Diagnosis not present

## 2012-03-29 DIAGNOSIS — M549 Dorsalgia, unspecified: Secondary | ICD-10-CM

## 2012-03-29 DIAGNOSIS — I739 Peripheral vascular disease, unspecified: Secondary | ICD-10-CM | POA: Diagnosis not present

## 2012-03-29 MED ORDER — HYDROCODONE-ACETAMINOPHEN 5-500 MG PO TABS: 1.0000 | ORAL_TABLET | Freq: Three times a day (TID) | ORAL | Status: DC | PRN

## 2012-03-29 MED ORDER — METOPROLOL TARTRATE 50 MG PO TABS: ORAL_TABLET | ORAL | Status: DC

## 2012-03-29 MED ORDER — TRIAMTERENE-HCTZ 37.5-25 MG PO CAPS: ORAL_CAPSULE | ORAL | Status: DC

## 2012-03-29 MED ORDER — CLOTRIMAZOLE-BETAMETHASONE 1-0.05 % EX CREA: TOPICAL_CREAM | Freq: Two times a day (BID) | CUTANEOUS | Status: DC

## 2012-03-29 MED ORDER — POTASSIUM CHLORIDE CRYS ER 20 MEQ PO TBCR: EXTENDED_RELEASE_TABLET | ORAL | Status: DC

## 2012-03-29 MED ORDER — SIMVASTATIN 20 MG PO TABS: ORAL_TABLET | ORAL | Status: DC

## 2012-03-29 NOTE — Assessment & Plan Note (Addendum)
Rash under abdomen, recurent , espescialy during the Summer months, cream prescribed

## 2012-03-29 NOTE — Patient Instructions (Addendum)
Annual wellness  exam in 3.5 month, please call if you need me before  Please call if the pain in your back worsens, I believe you may benefit from  therapy.  No changes in medication  HBA1C and non fastring chem 7 in 3.5 month, before next visit.  Labs are excellent!!, congrats

## 2012-03-29 NOTE — Progress Notes (Signed)
  Subjective:    Patient ID: Craig Fields, male    DOB: Jul 26, 1941, 71 y.o.   MRN: [id number]  HPI The PT is here for follow up and re-evaluation of chronic medical conditions, medication management and review of any available recent lab and radiology data.  Preventive health is updated, specifically  Cancer screening and Immunization.   Questions or concerns regarding consultations or procedures which the PT has had in the interim are  addressed. The PT denies any adverse reactions to current medications since the last visit.  There are no new concerns.  C/o increased back pain in the past several weeks, thinks it is mainly due to a bad mattress    Review of Systems See HPI Denies recent fever or chills. Denies sinus pressure, nasal congestion, ear pain or sore throat. Denies chest congestion, productive cough or wheezing. Denies chest pains, palpitations and leg swelling Denies abdominal pain, nausea, vomiting,diarrhea or constipation.   Denies dysuria, frequency, hesitancy or incontinence.  Denies headaches, seizures, numbness, or tingling. Denies depression, anxiety or insomnia. Denies skin break down or rash.        Objective:   Physical Exam Patient alert and oriented and in no cardiopulmonary distress.  HEENT: No facial asymmetry, EOMI, no sinus tenderness,  oropharynx pink and moist.  Neck supple no adenopathy.  Chest: Clear to auscultation bilaterally.  CVS: S1, S2 no murmurs, no S3.  ABD: Soft non tender. Bowel sounds normal.  Ext: No edema  MS: decreased  ROM spine, shoulders, hips and knees.  Skin: Intact, no ulcerations or rash noted.  Psych: Good eye contact, normal affect. Memory intact not anxious or depressed appearing.  CNS: CN 2-12 intact, power,decreased in left extremities.        Assessment & Plan:

## 2012-04-02 NOTE — Assessment & Plan Note (Signed)
Controlled, no change in medication  

## 2012-04-02 NOTE — Assessment & Plan Note (Signed)
Increased, resisting therapy at this time, believes he needs a new mattress

## 2012-04-02 NOTE — Assessment & Plan Note (Signed)
Controlled, no change in medication Hyperlipidemia:Low fat diet discussed and encouraged.  \ 

## 2012-04-02 NOTE — Assessment & Plan Note (Signed)
Unchanged. Patient re-educated about  the importance of commitment to a  minimum of 150 minutes of exercise per week. The importance of healthy food choices with portion control discussed. Encouraged to start a food diary, count calories and to consider  joining a support group. Sample diet sheets offered. Goals set by the patient for the next several months.    

## 2012-04-04 ENCOUNTER — Other Ambulatory Visit: Payer: Self-pay

## 2012-04-04 MED ORDER — CLOPIDOGREL BISULFATE 75 MG PO TABS: ORAL_TABLET | ORAL | Status: DC

## 2012-04-07 ENCOUNTER — Other Ambulatory Visit: Payer: Self-pay | Admitting: Family Medicine

## 2012-05-10 ENCOUNTER — Other Ambulatory Visit: Payer: Self-pay | Admitting: Family Medicine

## 2012-05-16 ENCOUNTER — Other Ambulatory Visit: Payer: Self-pay

## 2012-05-16 MED ORDER — HYDROCODONE-ACETAMINOPHEN 5-500 MG PO TABS: 1.0000 | ORAL_TABLET | Freq: Three times a day (TID) | ORAL | Status: DC | PRN

## 2012-06-07 ENCOUNTER — Ambulatory Visit (INDEPENDENT_AMBULATORY_CARE_PROVIDER_SITE_OTHER): Payer: Medicare Other

## 2012-06-07 DIAGNOSIS — I739 Peripheral vascular disease, unspecified: Secondary | ICD-10-CM | POA: Diagnosis not present

## 2012-06-07 DIAGNOSIS — Z23 Encounter for immunization: Secondary | ICD-10-CM | POA: Diagnosis not present

## 2012-07-17 DIAGNOSIS — E782 Mixed hyperlipidemia: Secondary | ICD-10-CM | POA: Diagnosis not present

## 2012-07-17 DIAGNOSIS — I10 Essential (primary) hypertension: Secondary | ICD-10-CM | POA: Diagnosis not present

## 2012-07-17 DIAGNOSIS — I359 Nonrheumatic aortic valve disorder, unspecified: Secondary | ICD-10-CM | POA: Diagnosis not present

## 2012-07-17 DIAGNOSIS — Z951 Presence of aortocoronary bypass graft: Secondary | ICD-10-CM | POA: Diagnosis not present

## 2012-07-19 DIAGNOSIS — R7301 Impaired fasting glucose: Secondary | ICD-10-CM | POA: Diagnosis not present

## 2012-07-19 DIAGNOSIS — R5381 Other malaise: Secondary | ICD-10-CM | POA: Diagnosis not present

## 2012-07-19 LAB — BASIC METABOLIC PANEL
BUN: 26 mg/dL — ABNORMAL HIGH (ref 6–23)
Calcium: 9.7 mg/dL (ref 8.4–10.5)
Potassium: 4.7 mEq/L (ref 3.5–5.3)
Sodium: 139 mEq/L (ref 135–145)

## 2012-07-19 LAB — HEMOGLOBIN A1C: Hgb A1c MFr Bld: 6 % — ABNORMAL HIGH (ref <5.7)

## 2012-07-26 ENCOUNTER — Ambulatory Visit (INDEPENDENT_AMBULATORY_CARE_PROVIDER_SITE_OTHER): Payer: Medicare Other | Admitting: Family Medicine

## 2012-07-26 ENCOUNTER — Encounter: Payer: Self-pay | Admitting: Family Medicine

## 2012-07-26 VITALS — BP 142/72 | HR 68 | Resp 18 | Ht 68.75 in | Wt 258.0 lb

## 2012-07-26 DIAGNOSIS — Z0000 Encounter for general adult medical examination without abnormal findings: Secondary | ICD-10-CM

## 2012-07-26 NOTE — Patient Instructions (Addendum)
F/U in  4 month, call if you need me before  Please review the advanced directive form this will be completed at your next visit  Please try to get around safely every day for exercise, and remember when you get to feeling frustrated about what you can no longer do, to remember th many thing you still can do, including living and enjoying your family  Increase the blood pressure medication as recommended by cardiologist please and use your CPAP machine, you will have more energy and protest your heart.  Colonoscopy is due in 2016  Depression screen shows you are not de[pressed. , so I know you will be able to smile when you feel upset, which is good

## 2012-07-26 NOTE — Progress Notes (Signed)
Subjective:    Patient ID: Craig Fields, male    DOB: Dec 04, 1940, 71 y.o.   MRN: [id number]  HPI Preventive Screening-Counseling & Management   Patient present here today for a Medicare annual wellness visit.   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 Quit nicotine over 30 years ago, no illicit drug use, never alcoholic    Risk Factors  Current exercise habits:  Minimal exercise , ambulates with a cane and has left hemiparesis  Dietary issues discussed:Low carb , low blood sugar diet discussed   Cardiac risk factors: established CAD  Depression Screen PHQ 9 score of 3 (Note: if answer to either of the following is "Yes", a more complete depression screening is indicated)   Over the past two weeks, have you felt down, depressed or hopeless? No  Over the past two weeks, have you felt little interest or pleasure in doing things? No  Have you lost interest or pleasure in daily life? No  Do you often feel hopeless? No  Do you cry easily over simple problems? No   Activities of Daily Living  In your present state of health, do you have any difficulty performing the following activities?  Driving?: Not driving since stroke Managing money?: No Feeding yourself?:yes Getting from bed to chair?:yes Climbing a flight of stairs?:yesPreparing food and eating?:yes Bathing or showering?yes Getting dressed?:yes Getting to the toilet?:yes Using the toilet?:yes Moving around from place to place?: yes  Fall Risk Assessment In the past year have you fallen or had a near fall?:No Are you currently taking any medications that make you  dizziness?:No   Hearing Difficulties: No Do you often ask people to speak up or repeat themselves?:No Do you experience ringing or noises in your ears?:No Do you have difficulty understanding soft or whispered voices?:No  Cognitive Testing  Alert? Yes Normal Appearance?Yes  Oriented to person? Yes Place? Yes  Time? Yes  Displays appropriate judgment?Yes  Can read the correct time from a watch face? yes Are you having problems remembering things?No  Advanced Directives have been discussed with the patient?Yes , full code, but if prognosis and overall condition is poor , then disconnect support. Will review MOST form with spouse and put in writing at future visit   List the Names of Other Physician/Practitioners you currently use: See list, sees [place] [facility]    Assessment:    Annual Wellness Exam   Plan:    During the course of the visit the patient was educated and counseled about appropriate screening and preventive services including:  A healthy diet is rich in fruit, vegetables and whole grains. Poultry fish, nuts and beans are a healthy choice for protein rather then red meat. A low sodium diet and drinking 64 ounces of water daily is generally recommended. Oils and sweet should be limited. Carbohydrates especially for those who are diabetic or overweight, should be limited to 30-45 gram per meal. It is important to eat on a regular schedule, at least 3 times daily. Snacks should be primarily fruits, vegetables or nuts. It is important that you exercise regularly at least 30 minutes  5 times a week. If you develop chest pain, have severe difficulty breathing, or feel very tired, stop exercising immediately and seek medical attention  Immunization reviewed and updated. Cancer screening reviewed and updated    Patient Instructions (the written plan) was given to the patient.  Medicare Attestation  I have personally reviewed:  [name] medical and social history   Their use of alcohol, tobacco or illicit drugs  Their current medications and supplements  [name] functional ability including ADLs,fall risks, home safety risks, cognitive, and hearing and visual impairment  Diet and physical activities  Evidence for depression or mood disorders  [name] weight, height, BMI, and visual acuity have been recorded in the chart. I have made referrals, counseling, and provided education to the patient based on review of the above and I have provided the patient with a written personalized care plan for preventive services.      Review of Systems     Objective:   Physical Exam        Assessment & Plan:

## 2012-07-30 DIAGNOSIS — Z0000 Encounter for general adult medical examination without abnormal findings: Secondary | ICD-10-CM | POA: Insufficient documentation

## 2012-07-30 NOTE — Assessment & Plan Note (Signed)
Exam performed, history reviewed, confirmed and updated. Pt is very functional despite left hemiparesis following a CVA, which at times depresses him. His cognitive function and memory are intact, his depression screen is negative. He is encouraged to commit to daily physical activity, healthy eating , rich in veg and fruit and encouraged o keep involved with community as well as reading and being informed on the news. He will complete the mOST form with his wife at a future visit, but advanced directives were discussed in her presence today at [facility] visit

## 2012-08-16 DIAGNOSIS — I739 Peripheral vascular disease, unspecified: Secondary | ICD-10-CM | POA: Diagnosis not present

## 2012-09-07 ENCOUNTER — Other Ambulatory Visit: Payer: Self-pay | Admitting: Family Medicine

## 2012-10-05 ENCOUNTER — Other Ambulatory Visit: Payer: Self-pay | Admitting: Family Medicine

## 2012-11-07 DIAGNOSIS — I739 Peripheral vascular disease, unspecified: Secondary | ICD-10-CM | POA: Diagnosis not present

## 2012-11-23 ENCOUNTER — Ambulatory Visit: Payer: Medicare Other | Admitting: Family Medicine

## 2012-12-05 ENCOUNTER — Other Ambulatory Visit: Payer: Self-pay | Admitting: Family Medicine

## 2012-12-07 ENCOUNTER — Encounter: Payer: Self-pay | Admitting: Family Medicine

## 2012-12-07 ENCOUNTER — Ambulatory Visit (INDEPENDENT_AMBULATORY_CARE_PROVIDER_SITE_OTHER): Payer: Medicare Other | Admitting: Family Medicine

## 2012-12-07 ENCOUNTER — Other Ambulatory Visit: Payer: Self-pay

## 2012-12-07 ENCOUNTER — Telehealth: Payer: Self-pay | Admitting: Family Medicine

## 2012-12-07 VITALS — BP 142/72 | HR 73 | Resp 16 | Ht 68.75 in | Wt 261.1 lb

## 2012-12-07 DIAGNOSIS — R7309 Other abnormal glucose: Secondary | ICD-10-CM

## 2012-12-07 DIAGNOSIS — L0390 Cellulitis, unspecified: Secondary | ICD-10-CM | POA: Insufficient documentation

## 2012-12-07 DIAGNOSIS — L0291 Cutaneous abscess, unspecified: Secondary | ICD-10-CM | POA: Diagnosis not present

## 2012-12-07 DIAGNOSIS — L989 Disorder of the skin and subcutaneous tissue, unspecified: Secondary | ICD-10-CM | POA: Diagnosis not present

## 2012-12-07 DIAGNOSIS — R7301 Impaired fasting glucose: Secondary | ICD-10-CM | POA: Diagnosis not present

## 2012-12-07 DIAGNOSIS — I10 Essential (primary) hypertension: Secondary | ICD-10-CM

## 2012-12-07 DIAGNOSIS — L918 Other hypertrophic disorders of the skin: Secondary | ICD-10-CM

## 2012-12-07 DIAGNOSIS — R7303 Prediabetes: Secondary | ICD-10-CM

## 2012-12-07 DIAGNOSIS — L909 Atrophic disorder of skin, unspecified: Secondary | ICD-10-CM

## 2012-12-07 DIAGNOSIS — E785 Hyperlipidemia, unspecified: Secondary | ICD-10-CM | POA: Diagnosis not present

## 2012-12-07 DIAGNOSIS — Z125 Encounter for screening for malignant neoplasm of prostate: Secondary | ICD-10-CM | POA: Diagnosis not present

## 2012-12-07 DIAGNOSIS — E669 Obesity, unspecified: Secondary | ICD-10-CM

## 2012-12-07 HISTORY — DX: Disorder of the skin and subcutaneous tissue, unspecified: L98.9

## 2012-12-07 MED ORDER — CEPHALEXIN 500 MG PO CAPS: 500.0000 mg | ORAL_CAPSULE | Freq: Three times a day (TID) | ORAL | Status: AC

## 2012-12-07 MED ORDER — HYDROCODONE-ACETAMINOPHEN 5-325 MG PO TABS: 1.0000 | ORAL_TABLET | Freq: Three times a day (TID) | ORAL | Status: DC | PRN

## 2012-12-07 NOTE — Patient Instructions (Addendum)
F/u end September or early October, please call if you need me before.  Fasting lipid, cmp and HBA1C this week and a PSA please.  Go back to lower dose of blood pressure medication please since you feel light headed with the higher dose.I will let cardiologist know what you told me today and the BP reading today.  You are referred to Dr Margo Aye for lesion on left middle toe  5 day course of antibiotic for infection at navel, please keep loosely covered so you do not accidentally scratch the skin.  Continuie to enjoy weekly outings with your older brother Craig Fields! Do not eat too much sweet and optato, watch the sugar!!!PLEASE

## 2012-12-07 NOTE — Telephone Encounter (Signed)
rx changed to hydrocodone 5/325mg  3x daily prn and printed for signature.

## 2012-12-10 NOTE — Assessment & Plan Note (Signed)
Erythema around navel antibiotic prescribed

## 2012-12-10 NOTE — Assessment & Plan Note (Signed)
Updated lab needed to further asses Patient educated about the importance of limiting  Carbohydrate intake , the need to commit to daily physical activity for a minimum of 30 minutes , and to commit weight loss. The fact that changes in all these areas will reduce or eliminate all together the development of diabetes is stressed.

## 2012-12-10 NOTE — Assessment & Plan Note (Signed)
Adequate control on reduced med dose with no lightheadedness. Advised pt to continue on the lower dose

## 2012-12-10 NOTE — Assessment & Plan Note (Signed)
Deteriorated. Patient re-educated about  the importance of commitment to a  minimum of 150 minutes of exercise per week. The importance of healthy food choices with portion control discussed. Encouraged to start a food diary, count calories and to consider  joining a support group. Sample diet sheets offered. Goals set by the patient for the next several months.    

## 2012-12-10 NOTE — Assessment & Plan Note (Signed)
Hyperlipidemia:Low fat diet discussed and encouraged.  Updated lab needed 

## 2012-12-10 NOTE — Progress Notes (Signed)
  Subjective:    Patient ID: Craig Fields, male    DOB: 1940/12/30, 72 y.o.   MRN: [id number]  HPI The PT is here for follow up and re-evaluation of chronic medical conditions, medication management and review of any available recent lab and radiology data.  Preventive health is updated, specifically  Cancer screening and Immunization.   Questions or concerns regarding consultations or procedures which the PT has had in the interim are  Addressed.Dose of antihypertensive was recently increased by his cardiologist, states however , that he became lightheaded, and has resumed the lower dose, which is half as strong Has been picking his navel, which is now red and painful no purulent drainage noted. The act is uncontrolled and is with his weak left upper extremity Wishes dermatology to remove painful toe lesion and is referred for same     Review of Systems See HPI Denies recent fever or chills. Denies sinus pressure, nasal congestion, ear pain or sore throat. Denies chest congestion, productive cough or wheezing. Denies chest pains, palpitations and leg swelling Denies abdominal pain, nausea, vomiting,diarrhea or constipation.   Denies dysuria, frequency, hesitancy or incontinence. Chronic  joint pain, and limitation in mobility. Denies headaches, seizures, chronic left sided weakness fromCVA Denies depression, anxiety or insomnia.       Objective:   Physical Exam Patient alert and oriented and in no cardiopulmonary distress.  HEENT: No facial asymmetry, EOMI, no sinus tenderness,  oropharynx pink and moist.  Neck decreased though adequate ROM no adenopathy.  Chest: Clear to auscultation bilaterally.  CVS: S1, S2 no murmurs, no S3.  ABD: Soft non tender. Bowel sounds normal.  Ext: No edema  MS: decreased ROM spine, shoulders, hips and knees.  Skin: peri umbilical erythema  Psych: Good eye contact, normal affect. Memory mildly impaired, not anxious or depressed  appearing.  CNS: CN 2-12 intact, power, tone and sensation decreased in left upper and lower extremities from CVA        Assessment & Plan:

## 2012-12-10 NOTE — Assessment & Plan Note (Signed)
Referred to dermatology for further management 

## 2012-12-11 DIAGNOSIS — Z125 Encounter for screening for malignant neoplasm of prostate: Secondary | ICD-10-CM | POA: Diagnosis not present

## 2012-12-11 DIAGNOSIS — L821 Other seborrheic keratosis: Secondary | ICD-10-CM | POA: Diagnosis not present

## 2012-12-11 DIAGNOSIS — L84 Corns and callosities: Secondary | ICD-10-CM | POA: Diagnosis not present

## 2012-12-11 DIAGNOSIS — L570 Actinic keratosis: Secondary | ICD-10-CM | POA: Diagnosis not present

## 2012-12-11 DIAGNOSIS — D235 Other benign neoplasm of skin of trunk: Secondary | ICD-10-CM | POA: Diagnosis not present

## 2012-12-11 DIAGNOSIS — R7301 Impaired fasting glucose: Secondary | ICD-10-CM | POA: Diagnosis not present

## 2012-12-11 DIAGNOSIS — E785 Hyperlipidemia, unspecified: Secondary | ICD-10-CM | POA: Diagnosis not present

## 2012-12-12 LAB — COMPREHENSIVE METABOLIC PANEL
ALT: 21 U/L (ref 0–53)
AST: 27 U/L (ref 0–37)
Alkaline Phosphatase: 38 U/L — ABNORMAL LOW (ref 39–117)
BUN: 27 mg/dL — ABNORMAL HIGH (ref 6–23)
Calcium: 10 mg/dL (ref 8.4–10.5)
Chloride: 99 mEq/L (ref 96–112)
Creat: 1.17 mg/dL (ref 0.50–1.35)
Total Bilirubin: 0.7 mg/dL (ref 0.3–1.2)

## 2012-12-12 LAB — LIPID PANEL
Cholesterol: 167 mg/dL (ref 0–200)
LDL Cholesterol: 94 mg/dL (ref 0–99)
Total CHOL/HDL Ratio: 3.4 Ratio
Triglycerides: 118 mg/dL (ref <150)
VLDL: 24 mg/dL (ref 0–40)

## 2012-12-12 LAB — PSA, MEDICARE: PSA: 3.43 ng/mL (ref <=4.00)

## 2012-12-20 ENCOUNTER — Other Ambulatory Visit: Payer: Self-pay | Admitting: Family Medicine

## 2012-12-20 ENCOUNTER — Telehealth: Payer: Self-pay

## 2012-12-20 ENCOUNTER — Encounter (HOSPITAL_COMMUNITY): Payer: Self-pay

## 2012-12-20 ENCOUNTER — Emergency Department (HOSPITAL_COMMUNITY)
Admission: EM | Admit: 2012-12-20 | Discharge: 2012-12-20 | Disposition: A | Discharge status: Home / Self Care | Payer: Medicare Other | Attending: Emergency Medicine | Admitting: Emergency Medicine

## 2012-12-20 DIAGNOSIS — E669 Obesity, unspecified: Secondary | ICD-10-CM | POA: Diagnosis not present

## 2012-12-20 DIAGNOSIS — Z87891 Personal history of nicotine dependence: Secondary | ICD-10-CM | POA: Insufficient documentation

## 2012-12-20 DIAGNOSIS — Z7982 Long term (current) use of aspirin: Secondary | ICD-10-CM | POA: Insufficient documentation

## 2012-12-20 DIAGNOSIS — I2510 Atherosclerotic heart disease of native coronary artery without angina pectoris: Secondary | ICD-10-CM | POA: Insufficient documentation

## 2012-12-20 DIAGNOSIS — Z8659 Personal history of other mental and behavioral disorders: Secondary | ICD-10-CM | POA: Diagnosis not present

## 2012-12-20 DIAGNOSIS — E785 Hyperlipidemia, unspecified: Secondary | ICD-10-CM | POA: Diagnosis not present

## 2012-12-20 DIAGNOSIS — G8190 Hemiplegia, unspecified affecting unspecified side: Secondary | ICD-10-CM | POA: Insufficient documentation

## 2012-12-20 DIAGNOSIS — I69998 Other sequelae following unspecified cerebrovascular disease: Secondary | ICD-10-CM | POA: Insufficient documentation

## 2012-12-20 DIAGNOSIS — IMO0002 Reserved for concepts with insufficient information to code with codable children: Secondary | ICD-10-CM | POA: Insufficient documentation

## 2012-12-20 DIAGNOSIS — Z951 Presence of aortocoronary bypass graft: Secondary | ICD-10-CM | POA: Insufficient documentation

## 2012-12-20 DIAGNOSIS — L0390 Cellulitis, unspecified: Secondary | ICD-10-CM

## 2012-12-20 DIAGNOSIS — Z85038 Personal history of other malignant neoplasm of large intestine: Secondary | ICD-10-CM | POA: Diagnosis not present

## 2012-12-20 DIAGNOSIS — Y838 Other surgical procedures as the cause of abnormal reaction of the patient, or of later complication, without mention of misadventure at the time of the procedure: Secondary | ICD-10-CM | POA: Insufficient documentation

## 2012-12-20 DIAGNOSIS — Z7902 Long term (current) use of antithrombotics/antiplatelets: Secondary | ICD-10-CM | POA: Diagnosis not present

## 2012-12-20 DIAGNOSIS — Z8669 Personal history of other diseases of the nervous system and sense organs: Secondary | ICD-10-CM | POA: Diagnosis not present

## 2012-12-20 DIAGNOSIS — R58 Hemorrhage, not elsewhere classified: Secondary | ICD-10-CM

## 2012-12-20 DIAGNOSIS — K429 Umbilical hernia without obstruction or gangrene: Secondary | ICD-10-CM

## 2012-12-20 DIAGNOSIS — Z79899 Other long term (current) drug therapy: Secondary | ICD-10-CM | POA: Diagnosis not present

## 2012-12-20 DIAGNOSIS — I10 Essential (primary) hypertension: Secondary | ICD-10-CM | POA: Diagnosis not present

## 2012-12-20 NOTE — Telephone Encounter (Signed)
pls refer to Dr Lovell Sheehan for evaluation IO amm entering referral

## 2012-12-20 NOTE — ED Provider Notes (Signed)
History  This chart was scribed for Geoffery Lyons, MD by Bennett Scrape, ED Scribe. This patient was seen in room APA03/APA03 and the patient's care was started at 2:18 PM.  CSN: [id number]  Arrival date & time 12/20/12  1354   First MD Initiated Contact with Patient 12/20/12 1418      Chief Complaint  Patient presents with  . Wound Check    The history is provided by the patient. No language interpreter was used.    Craig Fields is a 72 y.o. male who presents to the Emergency Department for a wound check. Pt states that he has had intermittent bleeding from underneath his umbilical hernia that started yesterday and became worse today. Wife states that the pt had hernia surgery for the same in 2006 by Dr. Lovell Sheehan but states that the hernia "poped back out" due to colon CA. He reports that the area healed with no complications. He was seen for the same last week and given an antibiotic prescription with no improvement. Wife states that she cleaned the area and placed a new bandage on the area with no improvement. She reports that she called Dr. Lovell Sheehan but no appointments were available today. Pt is currently on Plavix. He denies fever, abdominal pain, nausea and emesis as associated symptoms. He has a h/o HTN, CAD and HLD. He is a former smoker but denies alcohol use.   Past Medical History  Diagnosis Date  . IGT (impaired glucose tolerance)   . Anxiety disorder   . Obesity   . CAD (coronary artery disease)   . Hypertension   . Sleep apnea   . Hyperlipidemia   . CVA, old, hemiparesis     left side   . 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 Colon cancer 2007/05    Past Surgical History  Procedure Laterality Date  . Left inguinal  hernia repair  2005  . [name]  2007  . Coronary artery bypass graft  2004    x4    Family History  Problem Relation Age of Onset  . Pancreatic cancer Mother   . Heart disease Father   . Heart disease Sister   .  Skin cancer Sister   . Prostate cancer Sister   . Stroke Brother     History  Substance Use Topics  . Smoking status: Former Games developer  . Smokeless tobacco: Not on file  . Alcohol Use: No      Review of Systems  Constitutional: Negative for fever.  Gastrointestinal: Negative for nausea, vomiting and abdominal pain.  Skin: Positive for wound.  All other systems reviewed and are negative.    Allergies  Bee venom and Niacin  Home Medications   Current Outpatient Rx  Name  Route  Sig  Dispense  Refill  . aspirin (ASPIRIN LOW DOSE) 81 MG EC tablet   Oral   Take 81 mg by mouth daily.           . clopidogrel (PLAVIX) 75 MG tablet   Oral   Take 75 mg by mouth daily.         . Coenzyme Q10 (CO Q-10) 200 MG CAPS   Oral   Take 1 tablet by mouth daily.          Marland Kitchen HYDROcodone-acetaminophen (NORCO/VICODIN) 5-325 MG per tablet   Oral   Take 1 tablet by mouth 3 (three) times daily as needed for pain.   90 tablet  2   . metoprolol (LOPRESSOR) 50 MG tablet   Oral   Take 25 mg by mouth 2 (two) times daily.         . Multiple Vitamins-Minerals ([employer] SPECTRAVITE) TABS   Oral   Take 1 tablet by mouth daily.          . Omega-3 Fatty Acids (FISH OIL) 1000 MG CAPS   Oral   Take 2 capsules by mouth at bedtime.          . polyethylene glycol (MIRALAX / GLYCOLAX) packet   Oral   Take 17 g by mouth daily.         . potassium chloride SA (K-DUR,KLOR-CON) 20 MEQ tablet   Oral   Take 40 mEq by mouth daily.         . simvastatin (ZOCOR) 20 MG tablet   Oral   Take 20 mg by mouth daily.         Marland Kitchen triamterene-hydrochlorothiazide (MAXZIDE-25) 37.5-25 MG per tablet   Oral   Take 1 tablet by mouth daily.           Triage Vitals: BP 147/107  Pulse 75  Temp(Src) 97.9 F (36.6 C) (Oral)  Resp 18  Ht 5\' 8"  (1.727 m)  Wt 260 lb (117.935 kg)  BMI 39.54 kg/m2  SpO2 97%  Physical Exam  Nursing note and vitals reviewed. Constitutional: He is oriented to  person, place, and time. He appears well-developed and well-nourished. No distress.  HENT:  Head: Normocephalic and atraumatic.  Eyes: EOM are normal.  Neck: Neck supple. No tracheal deviation present.  Cardiovascular: Normal rate.   Pulmonary/Chest: Effort normal. No respiratory distress.  Abdominal:  Umbilical hernia, umbilicus underneath the hernia has skin eroded away with slight bleeding. No evidence for abscess, erythema or purulent drainage. Abdomen is otherwise benign   Musculoskeletal: Normal range of motion.  Neurological: He is alert and oriented to person, place, and time.  Skin: Skin is warm and dry.  Psychiatric: He has a normal mood and affect. His behavior is normal.    ED Course  Procedures (including critical care time)  DIAGNOSTIC STUDIES: Oxygen Saturation is 97% on room air, normal by my interpretation.    COORDINATION OF CARE: 2:23 PM-Discussed treatment plan which includes packing the area with dressing and having pt f/u with Dr. Lovell Sheehan with pt at bedside and pt agreed to plan.   Labs Reviewed - No data to display No results found.   No diagnosis found.    MDM  There is an area of skin breakdown in the naval adjacent to the hernia site.  This was packed with gauze to stop the bleeding.  He is to follow up with Dr. Lovell Sheehan in the next 1-2 days for further recommendations.    I personally performed the services described in this documentation, which was scribed in my presence. The recorded information has been reviewed and is accurate.          Geoffery Lyons, MD 12/20/12 2127

## 2012-12-20 NOTE — ED Notes (Signed)
Irritation around belly button from a hernia surgery

## 2012-12-26 DIAGNOSIS — K429 Umbilical hernia without obstruction or gangrene: Secondary | ICD-10-CM | POA: Diagnosis not present

## 2012-12-26 NOTE — Patient Instructions (Addendum)
Craig Fields  12/26/2012   Your procedure is scheduled on:  01/01/2013  Report to [facility] at  615  AM.  Call this number if you have problems the morning of surgery: [phone]   Remember:   Do not eat food or drink liquids after midnight.   Take these medicines the morning of surgery with A SIP OF WATER: hydrocodone,lopressor,maxzide   Do not wear jewelry, make-up or nail polish.  Do not wear lotions, powders, or perfumes.   Do not shave 48 hours prior to surgery. Men may shave face and neck.  Do not bring valuables to the hospital.  Contacts, dentures or bridgework may not be worn into surgery.  Leave suitcase in the car. After surgery it may be brought to your room.  For patients admitted to the hospital, checkout time is 11:00 AM the day of discharge.   Patients discharged the day of surgery will not be allowed to drive  home.  Name and phone number of your driver: family  Special Instructions: Shower using CHG 2 nights before surgery and the night before surgery.  If you shower the day of surgery use CHG.  Use special wash - you have one bottle of CHG for all showers.  You should use approximately 1/3 of the bottle for each shower.   Please read over the following fact sheets that you were given: Pain Booklet, Coughing and Deep Breathing, MRSA Information, Surgical Site Infection Prevention, Anesthesia Post-op Instructions and Care and Recovery After Surgery Hernia A hernia occurs when an internal organ pushes out through a weak spot in the abdominal wall. Hernias most commonly occur in the groin and around the navel. Hernias often can be pushed back into place (reduced). Most hernias tend to get worse over time. Some abdominal hernias can get stuck in the opening (irreducible or incarcerated hernia) and cannot be reduced. An irreducible abdominal hernia which is tightly squeezed into the opening is at risk for impaired blood supply (strangulated hernia). A strangulated hernia  is a medical emergency. Because of the risk for an irreducible or strangulated hernia, surgery may be recommended to repair a hernia. CAUSES   Heavy lifting.  Prolonged coughing.  Straining to have a bowel movement.  A cut (incision) made during an abdominal surgery. HOME CARE INSTRUCTIONS   Bed rest is not required. You may continue your normal activities.  Avoid lifting more than 10 pounds (4.5 kg) or straining.  Cough gently. If you are a smoker it is best to stop. Even the best hernia repair can break down with the continual strain of coughing. Even if you do not have your hernia repaired, a cough will continue to aggravate the problem.  Do not wear anything tight over your hernia. Do not try to keep it in with an outside bandage or truss. These can damage abdominal contents if they are trapped within the hernia sac.  Eat a normal diet.  Avoid constipation. Straining over long periods of time will increase hernia size and encourage breakdown of repairs. If you cannot do this with diet alone, stool softeners may be used. SEEK IMMEDIATE MEDICAL CARE IF:   You have a fever.  You develop increasing abdominal pain.  You feel nauseous or vomit.  Your hernia is stuck outside the abdomen, looks discolored, feels hard, or is tender.  You have any changes in your bowel habits or in the hernia that are unusual for you.  You have increased pain  or swelling around the hernia.  You cannot push the hernia back in place by applying gentle pressure while lying down. MAKE SURE YOU:   Understand these instructions.  Will watch your condition.  Will get help right away if you are not doing well or get worse. Document Released: 08/30/2005 Document Revised: 11/22/2011 Document Reviewed: 04/18/2008 [facility] Patient Information 2013 [place], [place]. PATIENT INSTRUCTIONS POST-ANESTHESIA  IMMEDIATELY FOLLOWING SURGERY:  Do not drive or operate machinery for the first twenty four hours  after surgery.  Do not make any important decisions for twenty four hours after surgery or while taking narcotic pain medications or sedatives.  If you develop intractable nausea and vomiting or a severe headache please notify your doctor immediately.  FOLLOW-UP:  Please make an appointment with your surgeon as instructed. You do not need to follow up with anesthesia unless specifically instructed to do so.  WOUND CARE INSTRUCTIONS (if applicable):  Keep a dry clean dressing on the anesthesia/puncture wound site if there is drainage.  Once the wound has quit draining you may leave it open to air.  Generally you should leave the bandage intact for twenty four hours unless there is drainage.  If the epidural site drains for more than 36-48 hours please call the anesthesia department.  QUESTIONS?:  Please feel free to call your physician or the hospital operator if you have any questions, and they will be happy to assist you.

## 2012-12-26 NOTE — H&P (Signed)
  NTS SOAP Note  Vital Signs:  Vitals as of: 12/26/2012: Systolic 185: Diastolic 92: Heart Rate 86: Temp 98.38F: Height 51ft 9.5in: Weight 255Lbs 0 Ounces: BMI 37.12  BMI : 37.12 kg/m2  Subjective: This 72 Years 19 Months old Male presents for bleeding from his umbilicus.  Started four days ago.  Seen in ER.  Does have an umbilical hernia.  Has been present for some time, but is increasing in size and causing him discomfort.   Review of Symptoms:  Constitutional:  fatigue Head:unremarkable    Eyes:unremarkable   Nose/Mouth/Throat:unremarkable Cardiovascular:  unremarkable   Respiratory:  dyspnea Gastrointestinal:  unremarkable   Genitourinary:unremarkable       walks with walker Skin:unremarkable Hematolgic/Lymphatic:unremarkable     Allergic/Immunologic:unremarkable     Past Medical History:    Reviewed   Past Medical History  Surgical History: partial colectomy, CABG, inguinal herniorrhaphy Medical Problems: CAD,CVA, HTN Allergies: nkda Medications: klorcon, triamterene, simvastatin, clopidogrel, lisinopril, metoprolol, hydrocodone, baby asa, fish oil   Social History:Reviewed  Social History  Preferred Language: English Race:  White Ethnicity: Not Hispanic / Latino Age: 72 Years 7 Months Marital Status:  M Alcohol:  No Recreational drug(s):  No   Smoking Status: Never smoker reviewed on 12/26/2012 Functional Status reviewed on mm/dd/yyyy ------------------------------------------------ Bathing: Normal Cooking: Normal Dressing: Normal Driving: Normal Eating: Normal Managing Meds: Normal Oral Care: Normal Shopping: Normal Toileting: Normal Transferring: Normal Walking: Normal Cognitive Status reviewed on mm/dd/yyyy ------------------------------------------------ Attention: Normal Decision Making: Normal Language: Normal Memory: Normal Motor: Normal Perception: Normal Problem Solving: Normal Visual and  Spatial: Normal   Family History:  Reviewed   Family History  Is there a family history UU:[id number]    Objective Information:   Walks with walker, has deficit with left side. Heart:  RRR, no 2/6 SEM Lungs:    CTA bilaterally, no wheezes, rhonchi, rales.  Breathing unlabored. Abdomen:Soft, NT/ND, no HSM, no masses.  Bleeding and erosion of skin noted inferiorly along the umbilical hernia.  Assessment:Umbilical hernia  Diagnosis &amp; Procedure Smart Code   Plan:Scheduled for umbilical herniorrhaphy on 01/01/13.  Stop plavix and ASA.   Patient Education:Alternative treatments to surgery were discussed with patient (and family).  Risks and benefits  of procedure were fully explained to the patient (and family) who gave informed consent. Patient/family questions were addressed.  Follow-up:Pending Surgery

## 2012-12-27 ENCOUNTER — Other Ambulatory Visit: Payer: Self-pay

## 2012-12-27 ENCOUNTER — Encounter (HOSPITAL_COMMUNITY): Payer: Self-pay | Admitting: Pharmacy Technician

## 2012-12-27 ENCOUNTER — Encounter (HOSPITAL_COMMUNITY): Payer: Self-pay

## 2012-12-27 ENCOUNTER — Encounter (HOSPITAL_COMMUNITY)
Admission: RE | Admit: 2012-12-27 | Discharge: 2012-12-27 | Disposition: A | Discharge status: Home / Self Care | Payer: Medicare Other | Source: Ambulatory Visit | Attending: General Surgery | Admitting: General Surgery

## 2012-12-27 DIAGNOSIS — K432 Incisional hernia without obstruction or gangrene: Secondary | ICD-10-CM | POA: Diagnosis not present

## 2012-12-27 DIAGNOSIS — I10 Essential (primary) hypertension: Secondary | ICD-10-CM | POA: Diagnosis not present

## 2012-12-27 DIAGNOSIS — G4730 Sleep apnea, unspecified: Secondary | ICD-10-CM | POA: Diagnosis not present

## 2012-12-27 DIAGNOSIS — Z01810 Encounter for preprocedural cardiovascular examination: Secondary | ICD-10-CM | POA: Diagnosis not present

## 2012-12-27 DIAGNOSIS — Z01812 Encounter for preprocedural laboratory examination: Secondary | ICD-10-CM | POA: Diagnosis not present

## 2012-12-27 DIAGNOSIS — E119 Type 2 diabetes mellitus without complications: Secondary | ICD-10-CM | POA: Diagnosis not present

## 2012-12-27 HISTORY — DX: Other chronic pain: G89.29

## 2012-12-27 HISTORY — DX: Dorsalgia, unspecified: M54.9

## 2012-12-27 HISTORY — DX: Unspecified convulsions: R56.9

## 2012-12-27 LAB — CBC WITH DIFFERENTIAL/PLATELET
Basophils Relative: 0 % (ref 0–1)
HCT: 45.8 % (ref 39.0–52.0)
Hemoglobin: 15.4 g/dL (ref 13.0–17.0)
Lymphs Abs: 1.5 10*3/uL (ref 0.7–4.0)
MCHC: 33.6 g/dL (ref 30.0–36.0)
Monocytes Absolute: 0.6 10*3/uL (ref 0.1–1.0)
Monocytes Relative: 7 % (ref 3–12)
Neutro Abs: 6.2 10*3/uL (ref 1.7–7.7)
RBC: 5.04 MIL/uL (ref 4.22–5.81)

## 2012-12-27 LAB — BASIC METABOLIC PANEL
BUN: 24 mg/dL — ABNORMAL HIGH (ref 6–23)
Chloride: 98 mEq/L (ref 96–112)
GFR calc Af Amer: 84 mL/min — ABNORMAL LOW (ref >90)
Glucose, Bld: 116 mg/dL — ABNORMAL HIGH (ref 70–99)
Potassium: 4.3 mEq/L (ref 3.5–5.1)
Sodium: 137 mEq/L (ref 135–145)

## 2012-12-27 LAB — SURGICAL PCR SCREEN: MRSA, PCR: NEGATIVE

## 2013-01-01 ENCOUNTER — Ambulatory Visit (HOSPITAL_COMMUNITY): Payer: Medicare Other | Admitting: Anesthesiology

## 2013-01-01 ENCOUNTER — Encounter (HOSPITAL_COMMUNITY): Payer: Self-pay | Admitting: *Deleted

## 2013-01-01 ENCOUNTER — Observation Stay (HOSPITAL_COMMUNITY)
Admission: RE | Admit: 2013-01-01 | Discharge: 2013-01-02 | Disposition: A | Discharge status: Home / Self Care | Payer: Medicare Other | Source: Ambulatory Visit | Attending: General Surgery | Admitting: General Surgery

## 2013-01-01 ENCOUNTER — Encounter (HOSPITAL_COMMUNITY)
Admission: RE | Disposition: A | Discharge status: Home / Self Care | Payer: Self-pay | Source: Ambulatory Visit | Attending: General Surgery

## 2013-01-01 ENCOUNTER — Encounter (HOSPITAL_COMMUNITY): Payer: Self-pay | Admitting: Anesthesiology

## 2013-01-01 DIAGNOSIS — Z01812 Encounter for preprocedural laboratory examination: Secondary | ICD-10-CM | POA: Diagnosis not present

## 2013-01-01 DIAGNOSIS — E119 Type 2 diabetes mellitus without complications: Secondary | ICD-10-CM | POA: Insufficient documentation

## 2013-01-01 DIAGNOSIS — I10 Essential (primary) hypertension: Secondary | ICD-10-CM | POA: Insufficient documentation

## 2013-01-01 DIAGNOSIS — Z01810 Encounter for preprocedural cardiovascular examination: Secondary | ICD-10-CM | POA: Insufficient documentation

## 2013-01-01 DIAGNOSIS — K429 Umbilical hernia without obstruction or gangrene: Secondary | ICD-10-CM | POA: Diagnosis not present

## 2013-01-01 DIAGNOSIS — K432 Incisional hernia without obstruction or gangrene: Secondary | ICD-10-CM | POA: Diagnosis not present

## 2013-01-01 DIAGNOSIS — G4730 Sleep apnea, unspecified: Secondary | ICD-10-CM | POA: Diagnosis not present

## 2013-01-01 HISTORY — PX: INSERTION OF MESH: SHX5868

## 2013-01-01 HISTORY — PX: INCISIONAL HERNIA REPAIR: SHX193

## 2013-01-01 LAB — GLUCOSE, CAPILLARY
Glucose-Capillary: 307 mg/dL — ABNORMAL HIGH (ref 70–99)
Glucose-Capillary: 220 mg/dL — ABNORMAL HIGH (ref 70–99)

## 2013-01-01 SURGERY — REPAIR, HERNIA, INCISIONAL
Anesthesia: General | Site: Abdomen | Wound class: Dirty or Infected

## 2013-01-01 MED ORDER — MIDAZOLAM HCL 2 MG/2ML IJ SOLN
1.0000 mg | INTRAMUSCULAR | Status: DC | PRN
  Administered 2013-01-01: 2 mg via INTRAVENOUS

## 2013-01-01 MED ORDER — CHLORHEXIDINE GLUCONATE 4 % EX LIQD: 1.0000 "application " | Freq: Once | CUTANEOUS | Status: DC

## 2013-01-01 MED ORDER — ENOXAPARIN SODIUM 40 MG/0.4ML ~~LOC~~ SOLN
40.0000 mg | Freq: Once | SUBCUTANEOUS | Status: AC
  Administered 2013-01-01: 40 mg via SUBCUTANEOUS

## 2013-01-01 MED ORDER — ENOXAPARIN SODIUM 40 MG/0.4ML ~~LOC~~ SOLN
SUBCUTANEOUS | Status: AC
  Filled 2013-01-01: qty 0.4

## 2013-01-01 MED ORDER — LIDOCAINE HCL (CARDIAC) 20 MG/ML IV SOLN
INTRAVENOUS | Status: DC | PRN
  Administered 2013-01-01: 40 mg via INTRAVENOUS

## 2013-01-01 MED ORDER — 0.9 % SODIUM CHLORIDE (POUR BTL) OPTIME
TOPICAL | Status: DC | PRN
  Administered 2013-01-01: 1000 mL

## 2013-01-01 MED ORDER — PROPOFOL 10 MG/ML IV EMUL
INTRAVENOUS | Status: AC
  Filled 2013-01-01: qty 20

## 2013-01-01 MED ORDER — LACTATED RINGERS IV SOLN
INTRAVENOUS | Status: DC
  Administered 2013-01-01: 500 mL via INTRAVENOUS
  Administered 2013-01-01: 07:00:00 via INTRAVENOUS
  Administered 2013-01-01: 500 mL via INTRAVENOUS

## 2013-01-01 MED ORDER — BUPIVACAINE HCL (PF) 0.5 % IJ SOLN
INTRAMUSCULAR | Status: DC | PRN
  Administered 2013-01-01: 10 mL

## 2013-01-01 MED ORDER — ONDANSETRON HCL 4 MG/2ML IJ SOLN
4.0000 mg | Freq: Once | INTRAMUSCULAR | Status: AC
  Administered 2013-01-01: 4 mg via INTRAVENOUS

## 2013-01-01 MED ORDER — FENTANYL CITRATE 0.05 MG/ML IJ SOLN
25.0000 ug | INTRAMUSCULAR | Status: DC | PRN
  Administered 2013-01-01 (×2): 25 ug via INTRAVENOUS

## 2013-01-01 MED ORDER — SUCCINYLCHOLINE CHLORIDE 20 MG/ML IJ SOLN
INTRAMUSCULAR | Status: DC | PRN
  Administered 2013-01-01: 120 mg via INTRAVENOUS

## 2013-01-01 MED ORDER — SIMVASTATIN 20 MG PO TABS
20.0000 mg | ORAL_TABLET | Freq: Every day | ORAL | Status: DC
  Administered 2013-01-01: 20 mg via ORAL
  Filled 2013-01-01 (×2): qty 1

## 2013-01-01 MED ORDER — FENTANYL CITRATE 0.05 MG/ML IJ SOLN
INTRAMUSCULAR | Status: AC
  Filled 2013-01-01: qty 2

## 2013-01-01 MED ORDER — PROPOFOL 10 MG/ML IV BOLUS
INTRAVENOUS | Status: DC | PRN
  Administered 2013-01-01: 100 mg via INTRAVENOUS

## 2013-01-01 MED ORDER — MIDAZOLAM HCL 2 MG/2ML IJ SOLN
INTRAMUSCULAR | Status: AC
  Filled 2013-01-01: qty 2

## 2013-01-01 MED ORDER — POVIDONE-IODINE 10 % EX OINT
TOPICAL_OINTMENT | CUTANEOUS | Status: AC
  Filled 2013-01-01: qty 1

## 2013-01-01 MED ORDER — LISINOPRIL 10 MG PO TABS
20.0000 mg | ORAL_TABLET | Freq: Every day | ORAL | Status: DC
  Administered 2013-01-02: 20 mg via ORAL
  Filled 2013-01-01: qty 2

## 2013-01-01 MED ORDER — ONDANSETRON HCL 4 MG/2ML IJ SOLN: 4.0000 mg | Freq: Four times a day (QID) | INTRAMUSCULAR | Status: DC | PRN

## 2013-01-01 MED ORDER — FENTANYL CITRATE 0.05 MG/ML IJ SOLN
INTRAMUSCULAR | Status: AC
  Filled 2013-01-01: qty 5

## 2013-01-01 MED ORDER — ONDANSETRON HCL 4 MG/2ML IJ SOLN: 4.0000 mg | Freq: Once | INTRAMUSCULAR | Status: DC | PRN

## 2013-01-01 MED ORDER — LACTATED RINGERS IV SOLN
INTRAVENOUS | Status: DC | PRN
  Administered 2013-01-01: 07:00:00 via INTRAVENOUS

## 2013-01-01 MED ORDER — FENTANYL CITRATE 0.05 MG/ML IJ SOLN
INTRAMUSCULAR | Status: DC | PRN
  Administered 2013-01-01: 50 ug via INTRAVENOUS
  Administered 2013-01-01: 100 ug via INTRAVENOUS

## 2013-01-01 MED ORDER — ONDANSETRON HCL 4 MG/2ML IJ SOLN
INTRAMUSCULAR | Status: AC
  Filled 2013-01-01: qty 2

## 2013-01-01 MED ORDER — ONDANSETRON HCL 4 MG PO TABS: 4.0000 mg | ORAL_TABLET | Freq: Four times a day (QID) | ORAL | Status: DC | PRN

## 2013-01-01 MED ORDER — EPHEDRINE SULFATE 50 MG/ML IJ SOLN
INTRAMUSCULAR | Status: AC
  Filled 2013-01-01: qty 1

## 2013-01-01 MED ORDER — ENOXAPARIN SODIUM 40 MG/0.4ML ~~LOC~~ SOLN
40.0000 mg | SUBCUTANEOUS | Status: DC
  Filled 2013-01-01: qty 0.4

## 2013-01-01 MED ORDER — POTASSIUM CHLORIDE CRYS ER 20 MEQ PO TBCR
40.0000 meq | EXTENDED_RELEASE_TABLET | Freq: Every day | ORAL | Status: DC
  Administered 2013-01-02: 40 meq via ORAL
  Filled 2013-01-01: qty 2

## 2013-01-01 MED ORDER — ACETAMINOPHEN 10 MG/ML IV SOLN
INTRAVENOUS | Status: AC
  Filled 2013-01-01: qty 100

## 2013-01-01 MED ORDER — CEFAZOLIN SODIUM-DEXTROSE 2-3 GM-% IV SOLR
INTRAVENOUS | Status: AC
  Filled 2013-01-01: qty 50

## 2013-01-01 MED ORDER — SUCCINYLCHOLINE CHLORIDE 20 MG/ML IJ SOLN
INTRAMUSCULAR | Status: AC
  Filled 2013-01-01: qty 1

## 2013-01-01 MED ORDER — METOPROLOL TARTRATE 25 MG PO TABS
25.0000 mg | ORAL_TABLET | Freq: Two times a day (BID) | ORAL | Status: DC
Start: 2013-01-01 — End: 2013-01-02
  Administered 2013-01-01 – 2013-01-02 (×2): 25 mg via ORAL
  Filled 2013-01-01 (×2): qty 1

## 2013-01-01 MED ORDER — DEXTROSE IN LACTATED RINGERS 5 % IV SOLN
INTRAVENOUS | Status: DC
  Administered 2013-01-01 – 2013-01-02 (×2): via INTRAVENOUS

## 2013-01-01 MED ORDER — MORPHINE SULFATE 2 MG/ML IJ SOLN: 2.0000 mg | INTRAMUSCULAR | Status: DC | PRN

## 2013-01-01 MED ORDER — TRIAMTERENE-HCTZ 37.5-25 MG PO TABS
1.0000 | ORAL_TABLET | Freq: Every day | ORAL | Status: DC
  Administered 2013-01-01: 1 via ORAL
  Filled 2013-01-01 (×2): qty 1

## 2013-01-01 MED ORDER — POLYETHYLENE GLYCOL 3350 17 G PO PACK: 17.0000 g | PACK | Freq: Every day | ORAL | Status: DC | PRN

## 2013-01-01 MED ORDER — ACETAMINOPHEN 10 MG/ML IV SOLN
1000.0000 mg | Freq: Four times a day (QID) | INTRAVENOUS | Status: AC
  Administered 2013-01-01 – 2013-01-02 (×4): 1000 mg via INTRAVENOUS
  Filled 2013-01-01 (×3): qty 100

## 2013-01-01 MED ORDER — POVIDONE-IODINE 10 % OINT PACKET
TOPICAL_OINTMENT | CUTANEOUS | Status: DC | PRN
  Administered 2013-01-01: 1 via TOPICAL

## 2013-01-01 MED ORDER — LIDOCAINE HCL (PF) 1 % IJ SOLN
INTRAMUSCULAR | Status: AC
  Filled 2013-01-01: qty 5

## 2013-01-01 MED ORDER — CEFAZOLIN SODIUM-DEXTROSE 2-3 GM-% IV SOLR
2.0000 g | INTRAVENOUS | Status: AC
  Administered 2013-01-01: 2 g via INTRAVENOUS

## 2013-01-01 MED ORDER — BUPIVACAINE HCL (PF) 0.5 % IJ SOLN
INTRAMUSCULAR | Status: AC
  Filled 2013-01-01: qty 30

## 2013-01-01 MED ORDER — EPHEDRINE SULFATE 50 MG/ML IJ SOLN
INTRAMUSCULAR | Status: DC | PRN
  Administered 2013-01-01 (×2): 10 mg via INTRAVENOUS

## 2013-01-01 SURGICAL SUPPLY — 35 items
BAG HAMPER (MISCELLANEOUS) ×3 IMPLANT
BLADE SURG SZ11 CARB STEEL (BLADE) ×3 IMPLANT
CLOTH BEACON ORANGE TIMEOUT ST (SAFETY) ×3 IMPLANT
COVER LIGHT HANDLE STERIS (MISCELLANEOUS) ×6 IMPLANT
DECANTER SPIKE VIAL GLASS SM (MISCELLANEOUS) ×3 IMPLANT
DURAPREP 26ML APPLICATOR (WOUND CARE) ×3 IMPLANT
ELECT REM PT RETURN 9FT ADLT (ELECTROSURGICAL) ×3
ELECTRODE REM PT RTRN 9FT ADLT (ELECTROSURGICAL) ×2 IMPLANT
GLOVE BIO SURGEON STRL SZ7.5 (GLOVE) ×3 IMPLANT
GLOVE BIOGEL PI IND STRL 7.5 (GLOVE) ×4 IMPLANT
GLOVE BIOGEL PI INDICATOR 7.5 (GLOVE) ×2
GLOVE ECLIPSE 7.0 STRL STRAW (GLOVE) ×3 IMPLANT
GLOVE EXAM NITRILE LRG STRL (GLOVE) ×3 IMPLANT
GOWN STRL REIN XL XLG (GOWN DISPOSABLE) ×9 IMPLANT
INST SET MINOR GENERAL (KITS) ×3 IMPLANT
KIT ROOM TURNOVER APOR (KITS) ×3 IMPLANT
LIGASURE IMPACT 36 18CM CVD LR (INSTRUMENTS) ×3 IMPLANT
MANIFOLD NEPTUNE II (INSTRUMENTS) ×3 IMPLANT
NEEDLE HYPO 25X1 1.5 SAFETY (NEEDLE) ×3 IMPLANT
NS IRRIG 1000ML POUR BTL (IV SOLUTION) ×3 IMPLANT
PACK MINOR (CUSTOM PROCEDURE TRAY) ×3 IMPLANT
PAD ABD 5X9 TENDERSORB (GAUZE/BANDAGES/DRESSINGS) ×3 IMPLANT
PAD ARMBOARD 7.5X6 YLW CONV (MISCELLANEOUS) ×3 IMPLANT
PATCH VENTRAL MEDIUM 6.4 (Mesh Specialty) ×3 IMPLANT
SET BASIN LINEN APH (SET/KITS/TRAYS/PACK) ×3 IMPLANT
SPONGE GAUZE 2X2 8PLY STRL LF (GAUZE/BANDAGES/DRESSINGS) ×6 IMPLANT
SPONGE GAUZE 4X4 12PLY (GAUZE/BANDAGES/DRESSINGS) ×3 IMPLANT
STAPLER VISISTAT (STAPLE) ×3 IMPLANT
SUT ETHIBOND NAB MO 7 #0 18IN (SUTURE) ×3 IMPLANT
SUT VIC AB 2-0 CT2 27 (SUTURE) ×3 IMPLANT
SUT VIC AB 3-0 SH 27 (SUTURE) ×3
SUT VIC AB 3-0 SH 27X BRD (SUTURE) ×2 IMPLANT
SUT VICRYL AB 3 0 TIES (SUTURE) IMPLANT
SYR CONTROL 10ML LL (SYRINGE) ×3 IMPLANT
TAPE CLOTH SURG 4X10 WHT LF (GAUZE/BANDAGES/DRESSINGS) ×3 IMPLANT

## 2013-01-01 NOTE — Transfer of Care (Signed)
Immediate Anesthesia Transfer of Care Note  Patient: Craig Fields  Procedure(s) Performed: Procedure(s) with comments: HERNIA REPAIR INCISIONAL (N/A) - umbilical area  Patient Location: PACU  Anesthesia Type:General  Level of Consciousness: awake, alert  and oriented  Airway & Oxygen Therapy: Patient Spontanous Breathing and Patient connected to nasal cannula oxygen  Post-op Assessment: Report given to PACU RN and Post -op Vital signs reviewed and stable  Post vital signs: Reviewed and stable  Complications: No apparent anesthesia complications

## 2013-01-01 NOTE — Interval H&P Note (Signed)
History and Physical Interval Note:  01/01/2013 7:18 AM  Craig Fields  has presented today for surgery, with the diagnosis of umbilical hernia   The various methods of treatment have been discussed with the patient and family. After consideration of risks, benefits and other options for treatment, the patient has consented to  Procedure(s): HERNIA REPAIR UMBILICAL ADULT (N/A) as a surgical intervention .  [name] history has been reviewed, patient examined, no change in status, stable for surgery.  I have reviewed the patient's chart and labs.  Questions were answered to the patient's satisfaction.     Franky Macho A

## 2013-01-01 NOTE — Anesthesia Procedure Notes (Signed)
Procedure Name: Intubation Date/Time: 01/01/2013 7:37 AM Performed by: Glendora Score A Pre-anesthesia Checklist: Patient identified, Emergency Drugs available, Suction available and Patient being monitored Patient Re-evaluated:Patient Re-evaluated prior to inductionOxygen Delivery Method: Circle system utilized Preoxygenation: Pre-oxygenation with 100% oxygen Intubation Type: IV induction, Rapid sequence and Cricoid Pressure applied Laryngoscope Size: Miller and 2 Grade View: Grade I Tube type: Oral Tube size: 7.0 mm Number of attempts: 1 Airway Equipment and Method: Stylet Placement Confirmation: ETT inserted through vocal cords under direct vision,  positive ETCO2 and breath sounds checked- equal and bilateral Secured at: 2 cm Tube secured with: Tape Dental Injury: Teeth and Oropharynx as per pre-operative assessment

## 2013-01-01 NOTE — Anesthesia Postprocedure Evaluation (Signed)
  Anesthesia Post-op Note  Patient: Craig Fields  Procedure(s) Performed: Procedure(s) with comments: HERNIA REPAIR INCISIONAL (N/A) - umbilical area  Patient Location: PACU  Anesthesia Type:General  Level of Consciousness: awake, alert  and oriented  Airway and Oxygen Therapy: Patient Spontanous Breathing and Patient connected to nasal cannula oxygen  Post-op Pain: none  Post-op Assessment: Post-op Vital signs reviewed, Patient's Cardiovascular Status Stable, Respiratory Function Stable, Patent Airway and No signs of Nausea or vomiting  Post-op Vital Signs: Reviewed and stable  Complications: No apparent anesthesia complications

## 2013-01-01 NOTE — Anesthesia Preprocedure Evaluation (Addendum)
Anesthesia Evaluation  Patient identified by MRN, date of birth, ID band Patient awake    Reviewed: Allergy & Precautions, H&P , NPO status , Patient's Chart, lab work & pertinent test results  Airway Mallampati: I TM Distance: >3 FB Neck ROM: Full    Dental  (+) Edentulous Upper and Edentulous Lower   Pulmonary sleep apnea , former smoker,  breath sounds clear to auscultation        Cardiovascular hypertension, Pt. on medications + CABG - CAD Rhythm:Regular Rate:Bradycardia     Neuro/Psych PSYCHIATRIC DISORDERS Anxiety CVA, Residual Symptoms    GI/Hepatic negative GI ROS,   Endo/Other  diabetes (borderline)Morbid obesity  Renal/GU      Musculoskeletal   Abdominal   Peds  Hematology   Anesthesia Other Findings   Reproductive/Obstetrics                           Anesthesia Physical Anesthesia Plan  ASA: III  Anesthesia Plan: General   Post-op Pain Management:    Induction: Intravenous and Rapid sequence  Airway Management Planned: Oral ETT  Additional Equipment:   Intra-op Plan:   Post-operative Plan: Extubation in OR  Informed Consent: I have reviewed the patients History and Physical, chart, labs and discussed the procedure including the risks, benefits and alternatives for the proposed anesthesia with the patient or authorized representative who has indicated his/her understanding and acceptance.     Plan Discussed with:   Anesthesia Plan Comments:         Anesthesia Quick Evaluation

## 2013-01-01 NOTE — Op Note (Signed)
Patient:  Bayard More Witherspoon  DOB:  09-Feb-1941  MRN:  [id number]   Preop Diagnosis:  Umbilical hernia  Postop Diagnosis:  Incisional hernia  Procedure:  Incisional herniorrhaphy with mesh  Surgeon:  Franky Macho, M.D.  Anes:  General endotracheal  Indications:  Patient is a 72 year old white male status post a partial colectomy in the past who now presents with an umbilical hernia. The risks and benefits of the procedure including bleeding, infection, cardiopulmonary difficulties, and the possibility of recurrence of the hernia were fully explained to the patient, who gave informed consent.  Procedure note:  The patient is placed the supine position. After induction of general endotracheal anesthesia, the abdomen was prepped and draped using usual sterile technique with DuraPrep. Surgical site confirmation was performed.  An infraumbilical incision was made down to the fascia. The patient had some eroded skin along the base of the umbilicus. This was excised without difficulty. It was disposed of. The abdominal cavity was entered into in order to free away the omentum from the hernia sac. The LigaSure was used to remove some of the omentum which had chronically adhesed to the underside of the abdominal wall. Once this was freed up, a large size proceed ventral patch was then inserted and secured to the fascia using 0 Ethibond interrupted sutures. The overlying fascia was reapproximated transversely using 0 Ethibond interrupted sutures. The base the umbilicus was secured back to the fascia using a 2-0 Vicryl interrupted suture. The skin was closed using staples. 0.5% Sensorcaine was instilled the surrounding wound. Betadine ointment and dry sterile dressing were applied.  All tape and needle counts were correct at the end of the procedure. Patient was extubated in the operating room and transferred to PACU in stable condition.   Complications:  None  EBL:   minimal  Specimen: None

## 2013-01-02 DIAGNOSIS — K432 Incisional hernia without obstruction or gangrene: Secondary | ICD-10-CM | POA: Diagnosis not present

## 2013-01-02 DIAGNOSIS — G4730 Sleep apnea, unspecified: Secondary | ICD-10-CM | POA: Diagnosis not present

## 2013-01-02 DIAGNOSIS — I10 Essential (primary) hypertension: Secondary | ICD-10-CM | POA: Diagnosis not present

## 2013-01-02 DIAGNOSIS — E119 Type 2 diabetes mellitus without complications: Secondary | ICD-10-CM | POA: Diagnosis not present

## 2013-01-02 DIAGNOSIS — Z01812 Encounter for preprocedural laboratory examination: Secondary | ICD-10-CM | POA: Diagnosis not present

## 2013-01-02 DIAGNOSIS — Z01810 Encounter for preprocedural cardiovascular examination: Secondary | ICD-10-CM | POA: Diagnosis not present

## 2013-01-02 LAB — CBC
Hemoglobin: 14.6 g/dL (ref 13.0–17.0)
RBC: 4.69 MIL/uL (ref 4.22–5.81)
WBC: 8.8 10*3/uL (ref 4.0–10.5)

## 2013-01-02 LAB — BASIC METABOLIC PANEL
GFR calc Af Amer: >90 mL/min (ref >90)
GFR calc non Af Amer: 80 mL/min — ABNORMAL LOW (ref >90)
Potassium: 3.6 mEq/L (ref 3.5–5.1)
Sodium: 138 mEq/L (ref 135–145)

## 2013-01-02 NOTE — Progress Notes (Signed)
UR Chart Review Completed  

## 2013-01-02 NOTE — Progress Notes (Signed)
Inpatient Diabetes Program Recommendations  AACE/ADA: New Consensus Statement on Inpatient Glycemic Control (2013)  Target Ranges:  Prepandial:   less than 140 mg/dL      Peak postprandial:   less than 180 mg/dL (1-2 hours)      Critically ill patients:  140 - 180 mg/dL   Results for CORDELLE, DAHMEN (MRN [id number]) as of 01/02/2013 08:41  Ref. Range 01/01/2013 16:53 01/01/2013 21:20  Glucose-Capillary Latest Range: 70-99 mg/dL 782 (H) 956 (H)   Results for AVIS, MCMAHILL (MRN [id number]) as of 01/02/2013 08:41  Ref. Range 01/02/2013 06:01  Glucose Latest Range: 70-99 mg/dL 469 (H)   Inpatient Diabetes Program Recommendations Correction (SSI): Please consider ordering CBGs ACHS with Novolog sensitive correction scale. Diet: May want to order carb modified diabetic diet when diet is advanced.  Note: Patient has a history of prediabetes noted in the chart.  Blood glucose on 01/01/13 ranged from 220-307 mg/dl and fasting blood glucose this morning is 134 mg/dl.  Last A1C was 6.2% on 12/11/12.  While inpatient, please consider ordering CBGs ACHS with Novolog sensitive correction scale.  When patient is ready to advance diet, may want to order carb modified diabetic diet.  Will continue to follow if necessary.  Thanks, Orlando Penner, RN, BSN, CCRN Diabetes Coordinator Inpatient Diabetes Program [phone]

## 2013-01-02 NOTE — Addendum Note (Signed)
Addendum created 01/02/13 1219 by Franco Nones, CRNA   Modules edited: Charges VN

## 2013-01-02 NOTE — Progress Notes (Signed)
AVS reviewed with patient and patient's wife.  Both verbalized understanding of d/c instructions.  Teach back method used.  Pt's IV removed.  Site WNL.  Pt verbalized all belongings were returned and in his possession.  Pt transported by NT via w/c to main entrance for d/c.  Pt stable at time of d/c.

## 2013-01-02 NOTE — Discharge Summary (Signed)
Physician Discharge Summary  Patient ID: Craig Fields MRN: [id number] DOB/AGE: Jul 04, 1941 72 y.o.  Admit date: 01/01/2013 Discharge date: 01/02/2013  Admission Diagnoses: Status post incisional herniorrhaphy with mesh, hypertension, status post CVA, high cholesterol levels  Discharge Diagnoses: Same Active Problems:   * No active hospital problems. *   Discharged Condition: good  Hospital Course: Patient is a 72 year old white male who underwent incisional herniorrhaphy with mesh on 01/01/2013. He was brought into the hospital for observation do to his comorbidities. He tolerated the procedure well. His postoperative course has been unremarkable. His diet was advanced without difficulty. The patient is being discharged home on 01/02/2013 in good improving condition.  Treatments: surgery: Incisional herniorrhaphy with mesh on 01/01/2013  Discharge Exam: Blood pressure 111/56, pulse 71, temperature 98.2 F (36.8 C), temperature source Oral, resp. rate 16, height 5\' 8"  (1.727 m), weight 119.6 kg (263 lb 10.7 oz), SpO2 98.00%. General appearance: alert, cooperative and no distress Resp: clear to auscultation bilaterally Cardio: regular rate and rhythm, S1, S2 normal, no murmur, click, rub or gallop GI: Soft. Dressing dry and intact.  Disposition: 01-Home or Self Care     Medication List    TAKE these medications       ASPIRIN LOW DOSE 81 MG EC tablet  Generic drug:  aspirin  Take 81 mg by mouth daily.     clopidogrel 75 MG tablet  Commonly known as:  PLAVIX  Take 75 mg by mouth daily.     Co Q-10 200 MG Caps  Take 1 tablet by mouth daily.     [employer] Spectravite Tabs  Take 1 tablet by mouth daily.     Fish Oil 1000 MG Caps  Take 2 capsules by mouth at bedtime.     HYDROcodone-acetaminophen 5-325 MG per tablet  Commonly known as:  NORCO/VICODIN  Take 1 tablet by mouth 3 (three) times daily as needed for pain.     lisinopril 20 MG tablet  Commonly known as:   PRINIVIL,ZESTRIL  Take 20 mg by mouth daily.     metoprolol 50 MG tablet  Commonly known as:  LOPRESSOR  Take 25 mg by mouth 2 (two) times daily.     polyethylene glycol packet  Commonly known as:  MIRALAX / GLYCOLAX  Take 17 g by mouth daily as needed (Constipation).     potassium chloride SA 20 MEQ tablet  Commonly known as:  K-DUR,KLOR-CON  Take 40 mEq by mouth daily.     simvastatin 20 MG tablet  Commonly known as:  ZOCOR  Take 20 mg by mouth daily.     triamterene-hydrochlorothiazide 37.5-25 MG per tablet  Commonly known as:  MAXZIDE-25  Take 1 tablet by mouth daily.           Follow-up Information   Follow up with Dalia Heading, MD. Schedule an appointment as soon as possible for a visit on 01/11/2013.   Contact information:   [address] [place] [address] [phone]       Signed: Franky Macho A 01/02/2013, 9:37 AM

## 2013-01-02 NOTE — Addendum Note (Signed)
Addendum created 01/02/13 1138 by Despina Hidden, CRNA   Modules edited: Notes Section   Notes Section:  File: [id number]

## 2013-01-02 NOTE — Anesthesia Postprocedure Evaluation (Signed)
  Anesthesia Post-op Note  Patient: Craig Couper Laviolette  Procedure(s) Performed: Procedure(s) with comments: HERNIA REPAIR INCISIONAL (N/A) - umbilical area INSERTION OF MESH (N/A)  Patient Location: room 326  Anesthesia Type:General  Level of Consciousness: awake, alert , oriented and patient cooperative  Airway and Oxygen Therapy: Patient Spontanous Breathing  Post-op Pain: 3 /10, mild  Post-op Assessment: Post-op Vital signs reviewed, Patient's Cardiovascular Status Stable, Respiratory Function Stable, Patent Airway, No signs of Nausea or vomiting and Pain level controlled  Post-op Vital Signs: Reviewed and stable  Complications: No apparent anesthesia complications

## 2013-01-03 ENCOUNTER — Encounter (HOSPITAL_COMMUNITY): Payer: Self-pay | Admitting: General Surgery

## 2013-01-03 NOTE — Progress Notes (Signed)
UR chart review completed.  

## 2013-01-16 DIAGNOSIS — I739 Peripheral vascular disease, unspecified: Secondary | ICD-10-CM | POA: Diagnosis not present

## 2013-02-07 ENCOUNTER — Other Ambulatory Visit: Payer: Self-pay | Admitting: Family Medicine

## 2013-02-08 ENCOUNTER — Other Ambulatory Visit: Payer: Self-pay | Admitting: Family Medicine

## 2013-02-23 DIAGNOSIS — E782 Mixed hyperlipidemia: Secondary | ICD-10-CM | POA: Diagnosis not present

## 2013-02-23 DIAGNOSIS — R0602 Shortness of breath: Secondary | ICD-10-CM | POA: Diagnosis not present

## 2013-02-23 DIAGNOSIS — I2510 Atherosclerotic heart disease of native coronary artery without angina pectoris: Secondary | ICD-10-CM | POA: Diagnosis not present

## 2013-02-28 ENCOUNTER — Encounter: Payer: Self-pay | Admitting: Cardiovascular Disease

## 2013-02-28 ENCOUNTER — Ambulatory Visit (HOSPITAL_COMMUNITY)
Admission: RE | Admit: 2013-02-28 | Discharge: 2013-02-28 | Disposition: A | Discharge status: Home / Self Care | Payer: Medicare Other | Source: Ambulatory Visit | Attending: Cardiovascular Disease | Admitting: Cardiovascular Disease

## 2013-02-28 DIAGNOSIS — R29898 Other symptoms and signs involving the musculoskeletal system: Secondary | ICD-10-CM | POA: Insufficient documentation

## 2013-02-28 DIAGNOSIS — I10 Essential (primary) hypertension: Secondary | ICD-10-CM | POA: Diagnosis not present

## 2013-02-28 DIAGNOSIS — R262 Difficulty in walking, not elsewhere classified: Secondary | ICD-10-CM | POA: Diagnosis not present

## 2013-02-28 DIAGNOSIS — IMO0001 Reserved for inherently not codable concepts without codable children: Secondary | ICD-10-CM | POA: Diagnosis not present

## 2013-02-28 DIAGNOSIS — M6281 Muscle weakness (generalized): Secondary | ICD-10-CM | POA: Insufficient documentation

## 2013-02-28 NOTE — Evaluation (Signed)
Physical Therapy Evaluation  Patient Details  Name: Craig Fields MRN: [id number] Date of Birth: June 04, 1941  Today's Date: 02/28/2013 Time: 1105-1150 PT Time Calculation (min): 45 min Charges: 1 evaluation             Visit#: 1 of 8  Re-eval: 03/30/13 Assessment Diagnosis: LLE weakness Surgical Date: 01/01/13 (hernia repair) Next MD Visit: Dr. Alanda Amass - unscheduled (cardiologist)  Authorization: Medicare    Authorization Time Period:    Authorization Visit#: 1 of 10   Past Medical History:  Past Medical History  Diagnosis Date  . IGT (impaired glucose tolerance)   . Anxiety disorder   . Obesity   . CAD (coronary artery disease)   . Hypertension   . Sleep apnea   . Hyperlipidemia   . CVA, old, hemiparesis     left side   . 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 Colon cancer 2007/05  . Seizures     at age 69 or 24. No meds and no reoccurances.  . Chronic back pain   . Anxiety     anxiety attacks at times   Past Surgical History:  Past Surgical History  Procedure Laterality Date  . Left inguinal  hernia repair  2005  . [name]  2007  . Coronary artery bypass graft  2004    x4  . Incisional hernia repair N/A 01/01/2013    Procedure: HERNIA REPAIR INCISIONAL;  Surgeon: Dalia Heading, MD;  Location: [facility];  Service: General;  Laterality: N/A;  umbilical area  . Insertion of mesh N/A 01/01/2013    Procedure: INSERTION OF MESH;  Surgeon: Dalia Heading, MD;  Location: [facility];  Service: General;  Laterality: N/A;    Subjective Symptoms/Limitations Pertinent History: Pt is a 72 year old male referred to PT for Lt leg weakness from a stroke about 11 years ago.  His c/co is his leg has been dragging more often and is out of align.  He reports that about 2 years ago was placed on statins and feels that is what started with leg getting progressivly weak and has not bee able to go out shopping and participating in lesiure activites with his wife  and grandchildren.  feels he loses his balance worse at night time. Over the years he has recieved multiple injections of Botox to his LUE without significant effect.  Patient Stated Goals: I want to be able to walk to my mailbox and back without stumbling Pain Assessment Currently in Pain?: Yes Pain Score:   3 Pain Location: Hip Pain Orientation: Right Pain Type: Acute pain Pain Frequency: Intermittent  Precautions/Restrictions  Precautions Precautions: Fall  Balance Screening Balance Screen Has the patient fallen in the past 6 months: No Has the patient had a decrease in activity level because of a fear of falling? : Yes Is the patient reluctant to leave their home because of a fear of falling? : No  Prior Functioning  Home Living Lives With: Family Prior Function Comments: Enjoys going to flea markets, walking at the grocery store.   Sensation/Coordination/Flexibility/Functional Tests Functional Tests Functional Tests: sit to stand without UE assist 21 inch surface  Assessment LLE Assessment LLE Assessment: Exceptions to [facility] LLE Strength LLE Overall Strength Comments: Hip IR: 0/5, Hip ER: 2/5 Left Hip Flexion: 3/5 Left Hip ABduction: 2+/5 Left Hip ADduction: 2+/5 Left Knee Flexion: 2/5 Left Knee Extension: 2/5  Mobility/Balance  Ambulation/Gait Ambulation/Gait: Yes Ambulation Distance (Feet): 70 Feet (  2 min w/min A and quad cane) Assistive device: Small based quad cane Gait Pattern: Decreased hip/knee flexion - left;Left hip hike;Left circumduction Berg Balance Test Sit to Stand: Able to stand using hands after several tries Standing Unsupported: Able to stand safely 2 minutes Sitting with Back Unsupported but Feet Supported on Floor or Stool: Able to sit safely and securely 2 minutes Stand to Sit: Controls descent by using hands Transfers: Able to transfer safely, definite need of hands Standing Unsupported with Eyes Closed: Able to stand 10 seconds  safely Standing Ubsupported with Feet Together: Able to place feet together independently and stand 1 minute safely From Standing, Reach Forward with Outstretched Arm: Can reach forward >5 cm safely (2") From Standing Position, Pick up Object from Floor: Unable to pick up shoe, but reaches 2-5 cm (1-2") from shoe and balances independently From Standing Position, Turn to Look Behind Over each Shoulder: Turn sideways only but maintains balance Turn 360 Degrees: Needs close supervision or verbal cueing Standing Unsupported, Alternately Place Feet on Step/Stool: Needs assistance to keep from falling or unable to try Standing Unsupported, One Foot in Front: Loses balance while stepping or standing Standing on One Leg: Unable to try or needs assist to prevent fall Total Score: 31 Timed Up and Go Test TUG: Normal TUG Normal TUG (seconds): 65 (quad cane and min A)     Physical Therapy Assessment and Plan PT Assessment and Plan Clinical Impression Statement: Pt is a 72 year old male referred to PT for LLE weakness and difficulty walking with following impairments below.  He has a significant hx of stroke (11 years ago) with LUE flexion contracture and LLE weakness.  Overall has been having impaired balance and gait over the past two years after taking statin medications.  Pt demonstrates most difficulty with significant weakness to his hips causing abnormal gait patterns and is having difficulty with functional activities including sit to stand causing extra pain on his RUE.  Pt will benefit from skilled therapeutic intervention in order to improve on the following deficits: Abnormal gait;Decreased strength;Pain;Difficulty walking;Decreased balance;Decreased activity tolerance PT Frequency: Min 2X/week PT Duration: 4 weeks PT Treatment/Interventions: Gait training;Stair training;Functional mobility training;Therapeutic activities;Therapeutic exercise;Balance training;Neuromuscular  re-education;Patient/family education PT Plan: [place] [facility] program to improve berg balance score.  Continue to emphasis improvements for gait speed and functional sit to stand.  Check on order for OT eval for RUE pain.     Goals Home Exercise Program Pt will Perform Home Exercise Program: Independently PT Goal: Perform Home Exercise Program - Progress: Goal set today PT Short Term Goals Time to Complete Short Term Goals: 2 weeks PT Short Term Goal 1: Pt will improve his static standing balance and demonstrate partial tandem gait x15 seconds on BLE.  PT Short Term Goal 2: Pt will improve his functional LE strength and go from sit to stand without UE assist from a 19 inch surface to decrease use of RUE to decrease pain to RUE PT Long Term Goals Time to Complete Long Term Goals: 4 weeks PT Long Term Goal 1: Pt will improve his berg balance test to 37/56 to decrease risk of falls. PT Long Term Goal 2: Pt will improve gait speed and compete the Tug in 45 seconds with supervision and LRAD for improved safety in the community.  Long Term Goal 3: Pt will improve his activity tolerance and complete 140 feet in 2 minutes with supervision with LRAD in order to return to community mobility.   Problem  List Patient Active Problem List   Diagnosis Date Noted  . Left leg weakness 02/28/2013  . Difficulty in walking(719.7) 02/28/2013  . Cellulitis 12/07/2012  . Skin lesion 12/07/2012  . Routine general medical examination at a health care facility 07/30/2012  . Ulcer of leg, chronic, right 11/16/2011  . Skin tag 11/16/2011  . Prediabetes 07/19/2011  . BACK PAIN 07/26/2010  . 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 COLON CANCER 02/21/2008  . HYPERLIPIDEMIA 02/21/2008  . OBESITY 02/21/2008  . Unspecified essential hypertension 02/21/2008  . CAD 02/21/2008  . SLEEP APNEA 02/21/2008    PT - End of Session Equipment Utilized During Treatment: Gait belt;Left ankle foot orthosis General Behavior  During Therapy: WFL for tasks assessed/performed Cognition: WFL for tasks performed PT Plan of Care PT Home Exercise Plan: see scanned report. PT Patient Instructions: encouraged sit to stand w/o UE assist, encouraged exercises at home.  discussed POC, answered questions about diagnosis.  Consulted and Agree with Plan of Care: Patient  GP Functional Assessment Tool Used: clinical observation  Functional Limitation: Mobility: Walking and moving around Mobility: Walking and Moving Around Current Status [phone]): At least 40 percent but less than 60 percent impaired, limited or restricted Mobility: Walking and Moving Around Goal Status [phone]): At least 20 percent but less than 40 percent impaired, limited or restricted  Aliayah Tyer, MPT, ATC 02/28/2013, 12:19 PM  Physician Documentation Your signature is required to indicate approval of the treatment plan as stated above.  Please sign and either send electronically or make a copy of this report for your files and return this physician signed original.   Please mark one 1.__approve of plan  2. ___approve of plan with the following conditions.   ______________________________                                                          _____________________ Physician Signature                                                                                                             Date

## 2013-03-02 ENCOUNTER — Ambulatory Visit (HOSPITAL_COMMUNITY)
Admission: RE | Admit: 2013-03-02 | Discharge: 2013-03-02 | Disposition: A | Discharge status: Home / Self Care | Payer: Medicare Other | Source: Ambulatory Visit | Attending: Cardiovascular Disease | Admitting: Cardiovascular Disease

## 2013-03-02 DIAGNOSIS — M6281 Muscle weakness (generalized): Secondary | ICD-10-CM | POA: Diagnosis not present

## 2013-03-02 DIAGNOSIS — R29898 Other symptoms and signs involving the musculoskeletal system: Secondary | ICD-10-CM

## 2013-03-02 DIAGNOSIS — R262 Difficulty in walking, not elsewhere classified: Secondary | ICD-10-CM | POA: Diagnosis not present

## 2013-03-02 DIAGNOSIS — I10 Essential (primary) hypertension: Secondary | ICD-10-CM | POA: Diagnosis not present

## 2013-03-02 DIAGNOSIS — IMO0001 Reserved for inherently not codable concepts without codable children: Secondary | ICD-10-CM | POA: Diagnosis not present

## 2013-03-02 NOTE — Progress Notes (Signed)
Physical Therapy Treatment Patient Details  Name: Craig Fields MRN: [id number] Date of Birth: September 18, 1940  Today's Date: 03/02/2013 Time: 0910-0956 PT Time Calculation (min): 46 min Charges; TE: [phone] Visit#: 2 of 8  Re-eval: 03/30/13 Assessment Diagnosis: LLE weakness Surgical Date: 01/01/13 Next MD Visit: Dr. Alanda Amass - unscheduled (cardiologist)  Authorization: Medicare  Authorization Time Period:    Authorization Visit#: 2 of 10   Subjective: Symptoms/Limitations Pertinent History: Reports he is doing about the same as normal.  He remembered after he left he forgot to get his HEP.  Pain Assessment Currently in Pain?: No/denies  Precautions/Restrictions  Precautions Precautions: Fall  Exercise/Treatments Aerobic Elliptical: NuStep: 8 minutes Track #1, Resistance #2 for activity tolerance Standing Knee Flexion: Both;10 reps;Limitations Knee Flexion Limitations: RLE 3#, max TC for LLE Functional Squat: 10 reps;Limitations Functional Squat Limitations: using chair behind Other Standing Knee Exercises: Hip Hikes on floor 10 reps BLE Seated Long Arc Quad: AROM;AAROM;Left;10 reps;Limitations Long Arc Quad Limitations: 10 sec holds Other Seated Knee Exercises: sit to stand 15 reps 21 in surface x15 Other Seated Knee Exercises: Hip isometric adduction 10x10 sec Supine Other Supine Knee Exercises: core: partial cruches x10; Rt oblique x10, Lt obligue x10 Sidelying Other Sidelying Knee Exercises: Rt S/L with max Assist for knee flexion   Physical Therapy Assessment and Plan PT Assessment and Plan Clinical Impression Statement: Treatment session focused on independence with HEP including LE strengthening and core strengthening to help with LLE foot clearance.  Pt continues to require max assist for knee flexion in standing and Rt s/l positon.  Able to achieve approximatly 60 degrees of knee flexion independently while sitting EOB and is able to achieve approximatly 95  degrees with RLE assistance.  PT Plan: [facility] program to improve berg balance score.  Continue to emphasis improvements for gait speed and functional sit to stand.  Check on order for OT eval for RUE pain.     Goals    Problem List Patient Active Problem List   Diagnosis Date Noted  . Left leg weakness 02/28/2013  . Difficulty in walking(719.7) 02/28/2013  . Cellulitis 12/07/2012  . Skin lesion 12/07/2012  . Routine general medical examination at a health care facility 07/30/2012  . Ulcer of leg, chronic, right 11/16/2011  . Skin tag 11/16/2011  . Prediabetes 07/19/2011  . BACK PAIN 07/26/2010  . 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 COLON CANCER 02/21/2008  . HYPERLIPIDEMIA 02/21/2008  . OBESITY 02/21/2008  . Unspecified essential hypertension 02/21/2008  . CAD 02/21/2008  . SLEEP APNEA 02/21/2008    PT - End of Session Equipment Utilized During Treatment: Gait belt;Left ankle foot orthosis General Behavior During Therapy: WFL for tasks assessed/performed Cognition: WFL for tasks performed PT Plan of Care PT Home Exercise Plan: see updated scanned report Consulted and Agree with Plan of Care: Patient  GP Functional Assessment Tool Used: clinical observation   Chrisette Man 03/02/2013, 10:11 AM

## 2013-03-06 ENCOUNTER — Ambulatory Visit (HOSPITAL_COMMUNITY)
Admission: RE | Admit: 2013-03-06 | Discharge: 2013-03-06 | Disposition: A | Discharge status: Home / Self Care | Payer: Medicare Other | Source: Ambulatory Visit | Attending: Cardiovascular Disease | Admitting: Cardiovascular Disease

## 2013-03-06 DIAGNOSIS — R29898 Other symptoms and signs involving the musculoskeletal system: Secondary | ICD-10-CM

## 2013-03-06 DIAGNOSIS — M6281 Muscle weakness (generalized): Secondary | ICD-10-CM | POA: Diagnosis not present

## 2013-03-06 DIAGNOSIS — IMO0001 Reserved for inherently not codable concepts without codable children: Secondary | ICD-10-CM | POA: Diagnosis not present

## 2013-03-06 DIAGNOSIS — I10 Essential (primary) hypertension: Secondary | ICD-10-CM | POA: Diagnosis not present

## 2013-03-06 DIAGNOSIS — R262 Difficulty in walking, not elsewhere classified: Secondary | ICD-10-CM | POA: Diagnosis not present

## 2013-03-06 NOTE — Progress Notes (Signed)
Physical Therapy Treatment Patient Details  Name: Craig Fields MRN: [id number] Date of Birth: [birth year]  Today's Date: 03/06/2013 Time: 4098-1191 PT Time Calculation (min): 42 min Charge: Gait 10' [phone]), NMR 8' [phone]), TE 23' [phone])  Visit#: 3 of 8  Re-eval: 03/30/13 Assessment Diagnosis: LLE weakness Surgical Date: 01/01/13 Next MD Visit: Dr. Alanda Amass - unscheduled (cardiologist)  Authorization: Medicare  Authorization Time Period:    Authorization Visit#: 3 of 10   Subjective: Symptoms/Limitations Symptoms: Pt reported he was sore following last session, reported compliance with HEP.  Pt reported decreased activity tolerance, unable to help out around the house and increase difficulty batheing.  No order for OT. Pain Assessment Currently in Pain?: No/denies  Precautions/Restrictions  Precautions Precautions: Fall  Exercise/Treatments Aerobic Elliptical: NuStep: 10 minutes Track #1, Resistance #2 for activity tolerance Standing Gait Training: Gait training to improve mechanics, sequencing and increase speed 2x 158 ft Other Standing Knee Exercises: Tandem stance on 4in step with intermittent Rt HHA 2x 30" with Rt LE on step Seated Long Arc Quad: Both;15 reps Long [employer] Limitations: 10 sec holds Other Seated Knee Exercises: sit to stand 15 reps 21 in surface x15 Other Seated Knee Exercises: Hip isometric adduction 10x10 sec     Physical Therapy Assessment and Plan PT Assessment and Plan Clinical Impression Statement: Treatment focus on improveing gait mechanics and increasing gait speed.  Pt able to independently sit to stand from 21 in without assistance following cueing for forward weight bearing.  Added tandem stance to improve balance with min assistance and max cueing to improve spatial awareness.   PT Plan: [facility] [facility] program to improve berg balance score.  Continue to emphasis improvements for gait speed and functional sit to stand.  Check  on order for OT eval for RUE pain.     Goals    Problem List Patient Active Problem List   Diagnosis Date Noted  . Left leg weakness 02/28/2013  . Difficulty in walking(719.7) 02/28/2013  . Cellulitis 12/07/2012  . Skin lesion 12/07/2012  . Routine general medical examination at a health care facility 07/30/2012  . Ulcer of leg, chronic, right 11/16/2011  . Skin tag 11/16/2011  . Prediabetes 07/19/2011  . BACK PAIN 07/26/2010  . 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 COLON CANCER 02/21/2008  . HYPERLIPIDEMIA 02/21/2008  . OBESITY 02/21/2008  . Unspecified essential hypertension 02/21/2008  . CAD 02/21/2008  . SLEEP APNEA 02/21/2008    PT - End of Session Equipment Utilized During Treatment: Gait belt;Left ankle foot orthosis Activity Tolerance: Patient limited by fatigue;Patient tolerated treatment well General Behavior During Therapy: [facility] for tasks assessed/performed Cognition: WFL for tasks performed  GP    [name] 03/06/2013, 9:42 AM

## 2013-03-08 ENCOUNTER — Ambulatory Visit (HOSPITAL_COMMUNITY)
Admission: RE | Admit: 2013-03-08 | Discharge: 2013-03-08 | Disposition: A | Discharge status: Home / Self Care | Payer: Medicare Other | Source: Ambulatory Visit | Attending: Cardiovascular Disease | Admitting: Cardiovascular Disease

## 2013-03-08 DIAGNOSIS — R29898 Other symptoms and signs involving the musculoskeletal system: Secondary | ICD-10-CM

## 2013-03-08 DIAGNOSIS — R262 Difficulty in walking, not elsewhere classified: Secondary | ICD-10-CM

## 2013-03-08 DIAGNOSIS — M6281 Muscle weakness (generalized): Secondary | ICD-10-CM | POA: Diagnosis not present

## 2013-03-08 DIAGNOSIS — I10 Essential (primary) hypertension: Secondary | ICD-10-CM | POA: Diagnosis not present

## 2013-03-08 DIAGNOSIS — IMO0001 Reserved for inherently not codable concepts without codable children: Secondary | ICD-10-CM | POA: Diagnosis not present

## 2013-03-08 NOTE — Progress Notes (Signed)
Physical Therapy Treatment Patient Details  Name: Craig Fields MRN: [id number] Date of Birth: [birth year]  Today's Date: 03/08/2013 Time: 2956-2130 PT Time Calculation (min): 35 min Charge: Gait 8' [phone]), TE 27' [phone])  Visit#: 4 of 8  Re-eval: 03/30/13 Assessment Diagnosis: LLE weakness Surgical Date: 01/01/13 Next MD Visit: Dr. Alanda Amass - unscheduled (cardiologist)  Authorization: Medicare  Authorization Time Period:    Authorization Visit#: 4 of 10   Subjective: Symptoms/Limitations Symptoms: Pt reported he was stiff following sitting for long periods of time.  Pt c/o pain in middle toe of Lt foot corn. Pain Assessment Currently in Pain?: No/denies  Precautions/Restrictions  Precautions Precautions: Fall  Exercise/Treatments Aerobic Elliptical: NuStep: 10 minutes Track #1, Resistance #2 for activity tolerance Standing Knee Flexion: Both;15 reps;Limitations Knee Flexion Limitations: RLE 3#, max TC for LLE Functional Squat: 15 reps;Limitations Functional Squat Limitations: using chair behind Gait Training: Gait training to improve mechanics, sequencing with cueing for placement with cane  Other Standing Knee Exercises: Tandem stance on 4in step 3x 30" no HHA, min-mod assistance Other Standing Knee Exercises: hip abduction  Physical Therapy Assessment and Plan PT Assessment and Plan Clinical Impression Statement: Continued [place] balance program for balance and functional strengthening. Added hip abduction for glut med strengthening. Pt required multimodal cueing for posture and to utilize correct musculature with therex. Annett Fabian, PT refaxed referral for OT for RUE PT Plan: [facility] program to improve berg balance score.  Continue to emphasis improvements for gait speed and functional sit to stand.  Check on order for OT eval for RUE pain.     Goals    Problem List Patient Active Problem List   Diagnosis Date Noted  . Left leg weakness  02/28/2013  . Difficulty in walking(719.7) 02/28/2013  . Cellulitis 12/07/2012  . Skin lesion 12/07/2012  . Routine general medical examination at a health care facility 07/30/2012  . Ulcer of leg, chronic, right 11/16/2011  . Skin tag 11/16/2011  . Prediabetes 07/19/2011  . BACK PAIN 07/26/2010  . 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 COLON CANCER 02/21/2008  . HYPERLIPIDEMIA 02/21/2008  . OBESITY 02/21/2008  . Unspecified essential hypertension 02/21/2008  . CAD 02/21/2008  . SLEEP APNEA 02/21/2008    PT - End of Session Equipment Utilized During Treatment: Gait belt;Other (comment) (Left ankle foot orthosis) Activity Tolerance: Patient limited by fatigue;Patient tolerated treatment well General Behavior During Therapy: [facility] for tasks assessed/performed Cognition: WFL for tasks performed  GP    [name] 03/08/2013, 2:19 PM

## 2013-03-10 ENCOUNTER — Other Ambulatory Visit: Payer: Self-pay | Admitting: Family Medicine

## 2013-03-12 ENCOUNTER — Telehealth (HOSPITAL_COMMUNITY): Payer: Self-pay

## 2013-03-13 ENCOUNTER — Ambulatory Visit (HOSPITAL_COMMUNITY): Payer: Medicare Other

## 2013-03-15 ENCOUNTER — Ambulatory Visit (HOSPITAL_COMMUNITY)
Admission: RE | Admit: 2013-03-15 | Discharge: 2013-03-15 | Disposition: A | Discharge status: Home / Self Care | Payer: Medicare Other | Source: Ambulatory Visit | Attending: Cardiovascular Disease | Admitting: Cardiovascular Disease

## 2013-03-15 DIAGNOSIS — I10 Essential (primary) hypertension: Secondary | ICD-10-CM | POA: Insufficient documentation

## 2013-03-15 DIAGNOSIS — IMO0001 Reserved for inherently not codable concepts without codable children: Secondary | ICD-10-CM | POA: Diagnosis not present

## 2013-03-15 DIAGNOSIS — R262 Difficulty in walking, not elsewhere classified: Secondary | ICD-10-CM | POA: Insufficient documentation

## 2013-03-15 DIAGNOSIS — M6281 Muscle weakness (generalized): Secondary | ICD-10-CM | POA: Diagnosis not present

## 2013-03-15 DIAGNOSIS — R29898 Other symptoms and signs involving the musculoskeletal system: Secondary | ICD-10-CM

## 2013-03-15 NOTE — Progress Notes (Signed)
Physical Therapy Treatment Patient Details  Name: Craig Fields MRN: [id number] Date of Birth: 01/30/1941  Today's Date: 03/15/2013 Time: 1018-1107 PT Time Calculation (min): 49 min Charge: TE 49' 1018-1107  Visit#: 5 of 8  Re-eval: 03/30/13 Assessment Diagnosis: LLE weakness Surgical Date: 01/01/13 Next MD Visit: Dr. Alanda Amass - unscheduled (cardiologist)  Authorization: Medicare  Authorization Time Period:    Authorization Visit#: 5 of 10   Subjective: Symptoms/Limitations Symptoms: Pt reported he was super sore last apt., has apt for new pain of shoes later today, Pain Assessment Currently in Pain?: No/denies  Precautions/Restrictions  Precautions Precautions: Fall  Exercise/Treatments Aerobic Elliptical: NuStep: 10 minutes Hill #3, Resistance #4 for activity tolerance, SPM >100 Standing Knee Flexion: Both;15 reps;Limitations Knee Flexion Limitations: RLE 3#, max TC for LLE Functional Squat: 15 reps Gait Training: Cueing to improve mechanics and sequencing with cane through out session Other Standing Knee Exercises: Tandem stance on 4in step 3x 30" no HHA, min-mod assistance Other Standing Knee Exercises: Retro and sidestepping 1RT    Physical Therapy Assessment and Plan PT Assessment and Plan Clinical Impression Statement: Continued [place] balance program to improve functional strengthening and balance training. Pt continues to compensate due to weak musculuatre, tactile and verbal cueing to utilize correct muscle with movements. Progressed balance activiteis wtih min assistance PT Plan: [facility] [facility] program to improve berg balance score.  Continue to emphasis improvements for gait speed and functional sit to stand.  Check on order for OT eval for RUE pain.     Goals    Problem List Patient Active Problem List   Diagnosis Date Noted  . Left leg weakness 02/28/2013  . Difficulty in walking(719.7) 02/28/2013  . Cellulitis 12/07/2012  . Skin lesion 12/07/2012   . Routine general medical examination at a health care facility 07/30/2012  . Ulcer of leg, chronic, right 11/16/2011  . Skin tag 11/16/2011  . Prediabetes 07/19/2011  . BACK PAIN 07/26/2010  . 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 COLON CANCER 02/21/2008  . HYPERLIPIDEMIA 02/21/2008  . OBESITY 02/21/2008  . Unspecified essential hypertension 02/21/2008  . CAD 02/21/2008  . SLEEP APNEA 02/21/2008    PT - End of Session Equipment Utilized During Treatment: Gait belt;Other (comment) Activity Tolerance: Patient limited by fatigue;Patient tolerated treatment well General Behavior During Therapy: [facility] for tasks assessed/performed Cognition: WFL for tasks performed  GP    [name] 03/15/2013, 6:59 PM

## 2013-03-20 ENCOUNTER — Ambulatory Visit (HOSPITAL_COMMUNITY)
Admission: RE | Admit: 2013-03-20 | Discharge: 2013-03-20 | Disposition: A | Discharge status: Home / Self Care | Payer: Medicare Other | Source: Ambulatory Visit | Attending: Cardiovascular Disease | Admitting: Cardiovascular Disease

## 2013-03-20 DIAGNOSIS — IMO0001 Reserved for inherently not codable concepts without codable children: Secondary | ICD-10-CM | POA: Diagnosis not present

## 2013-03-20 DIAGNOSIS — R29898 Other symptoms and signs involving the musculoskeletal system: Secondary | ICD-10-CM

## 2013-03-20 DIAGNOSIS — M6281 Muscle weakness (generalized): Secondary | ICD-10-CM | POA: Diagnosis not present

## 2013-03-20 DIAGNOSIS — R262 Difficulty in walking, not elsewhere classified: Secondary | ICD-10-CM | POA: Diagnosis not present

## 2013-03-20 DIAGNOSIS — I10 Essential (primary) hypertension: Secondary | ICD-10-CM | POA: Diagnosis not present

## 2013-03-20 NOTE — Progress Notes (Signed)
Physical Therapy Treatment Patient Details  Name: Yi Haugan Creque MRN: [id number] Date of Birth: 11/14/1940  Today's Date: 03/20/2013 Time: 0940-1024 PT Time Calculation (min): 44 min Charges: NMR: [phone] TE: 1005-1024 Visit#: 6 of 8  Re-eval: 03/30/13    Authorization: Medicare  Authorization Time Period:    Authorization Visit#: 6 of 10   Subjective: Symptoms/Limitations Symptoms: "It might not look like much, but I am doing a lot more at home with less breaks."  Pt reports that he feels his Lt leg is more stiff and has been working on his exercises at home.  Pain Assessment Currently in Pain?: No/denies  Precautions/Restrictions     Exercise/Treatments Aerobic Elliptical: NuStep: 12 minutes, Hill #3, Resistance #4 for activity tolerance, SPM >100 Seated Long Arc Quad: Both;15 reps Long [employer] Limitations: 3# RLE, 1# LLE Standing Standing Eyes Opened: Narrow base of support (BOS);Head turns;Solid surface;Limitations Standing Eyes Opened Limitations: 10 reps Standing Eyes Closed: Narrow base of support (BOS);Solid surface;2 reps;30 secs Tandem Stance: Eyes open;3 reps;30 secs;Limitations Tandem Stance Limitations: solid surface partial tandem, Rt and Lt Sidestepping: 2 reps;Limitations Sidestepping Limitations: mod A Cone Rotation: Solid surface;Right turn;Left turn;Limitations Cone Rotation Limitations: without cones, rotation x20 reps  Knee Flexion: Both;15 reps;Limitations Knee Flexion Limitations: RLE 3#, max TC for LLE Functional Squat: 20 reps  Seated Dynamic Sitting: Eyes opened;No upper extremity support;Limitations Dynamic Sitting Limitations: postural training with shoulder flexion x10 reps to RUE   Physical Therapy Assessment and Plan PT Assessment and Plan Clinical Impression Statement: Treatment consisted of improving LE strength and improving balance.  Pt has improved postural and mechanics with partial tandem stance after max cueing, he is able to  self correct for last activity.  Increased weight and reps when able.  Continues to improve his activity tolerance.  PT Plan: [facility] [facility] program to improve berg balance score.  Continue to emphasis improvements for gait speed and functional sit to stand.  Check on order for OT eval for RUE pain.     Goals    Problem List Patient Active Problem List   Diagnosis Date Noted  . Left leg weakness 02/28/2013  . Difficulty in walking(719.7) 02/28/2013  . Cellulitis 12/07/2012  . Skin lesion 12/07/2012  . Routine general medical examination at a health care facility 07/30/2012  . Ulcer of leg, chronic, right 11/16/2011  . Skin tag 11/16/2011  . Prediabetes 07/19/2011  . BACK PAIN 07/26/2010  . 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 COLON CANCER 02/21/2008  . HYPERLIPIDEMIA 02/21/2008  . OBESITY 02/21/2008  . Unspecified essential hypertension 02/21/2008  . CAD 02/21/2008  . SLEEP APNEA 02/21/2008    PT - End of Session Equipment Utilized During Treatment: Gait belt;Other (comment) Activity Tolerance: Patient limited by fatigue;Patient tolerated treatment well General Behavior During Therapy: [facility] for tasks assessed/performed Cognition: WFL for tasks performed  GP    Gwendlyon Zumbro, MPT, ATC 03/20/2013, 10:20 AM

## 2013-03-21 ENCOUNTER — Telehealth: Payer: Self-pay | Admitting: Family Medicine

## 2013-03-22 ENCOUNTER — Ambulatory Visit (HOSPITAL_COMMUNITY)
Admission: RE | Admit: 2013-03-22 | Discharge: 2013-03-22 | Disposition: A | Discharge status: Home / Self Care | Payer: Medicare Other | Source: Ambulatory Visit | Attending: Cardiovascular Disease | Admitting: Cardiovascular Disease

## 2013-03-22 ENCOUNTER — Other Ambulatory Visit: Payer: Self-pay | Admitting: Family Medicine

## 2013-03-22 DIAGNOSIS — R262 Difficulty in walking, not elsewhere classified: Secondary | ICD-10-CM | POA: Diagnosis not present

## 2013-03-22 DIAGNOSIS — IMO0001 Reserved for inherently not codable concepts without codable children: Secondary | ICD-10-CM | POA: Diagnosis not present

## 2013-03-22 DIAGNOSIS — I639 Cerebral infarction, unspecified: Secondary | ICD-10-CM

## 2013-03-22 DIAGNOSIS — M6281 Muscle weakness (generalized): Secondary | ICD-10-CM | POA: Diagnosis not present

## 2013-03-22 DIAGNOSIS — I10 Essential (primary) hypertension: Secondary | ICD-10-CM | POA: Diagnosis not present

## 2013-03-22 DIAGNOSIS — R29898 Other symptoms and signs involving the musculoskeletal system: Secondary | ICD-10-CM

## 2013-03-22 NOTE — Telephone Encounter (Signed)
Yes I will, pls let him know, referral is entered

## 2013-03-22 NOTE — Progress Notes (Signed)
Physical Therapy Treatment Patient Details  Name: Craig Fields MRN: [id number] Date of Birth: [birth year]  Today's Date: 03/22/2013 Time: 1018-1106 PT Time Calculation (min): 48 min  Visit#: 7 of 8  Re-eval: 03/30/13 Authorization: Medicare  Authorization Visit#: 7 of 10  Charges:  therex 46'  Subjective: Symptoms/Limitations Symptoms: Pt states he feels he's doing alot better.  Reports compliance with HEP.  States he is ready to begin therapy for his shoulder. Pain Assessment Currently in Pain?: No/denies   Exercise/Treatments Aerobic Elliptical: NuStep: 12 minutes, Hill #3, Resistance #4 for activity tolerance, SPM >100 Standing Knee Flexion: Both;15 reps;Limitations Knee Flexion Limitations: RLE 3#, max TC for LLE Functional Squat: 20 reps Gait Training: Cueing to improve mechanics and sequencing with cane through out session Other Standing Knee Exercises: Tandem stance on 4in step 3x 30" no HHA, min-mod assistance Other Standing Knee Exercises: Retro and sidestepping 1RT Seated Long Arc Quad: Both;15 reps Long Arc Quad Limitations: 3# RLE, 1# LLE Other Seated Knee Exercises: sit to stand 3X        Physical Therapy Assessment and Plan PT Assessment and Plan Clinical Impression Statement: Pt with noted difficulty with sit to stand transfer, requiring assistance.  Worked on sit to stand activity to increase strength and increase independence.  Pt requires max assistance with Lt LE flexion with very minimal contraction achieved.  Pt received order for OT and will be evaluated next visit. PT Plan: [place] next visit.     Problem List Patient Active Problem List   Diagnosis Date Noted  . Left leg weakness 02/28/2013  . Difficulty in walking(719.7) 02/28/2013  . Cellulitis 12/07/2012  . Skin lesion 12/07/2012  . Routine general medical examination at a health care facility 07/30/2012  . Ulcer of leg, chronic, right 11/16/2011  . Skin tag 11/16/2011  .  Prediabetes 07/19/2011  . BACK PAIN 07/26/2010  . 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 COLON CANCER 02/21/2008  . HYPERLIPIDEMIA 02/21/2008  . OBESITY 02/21/2008  . Unspecified essential hypertension 02/21/2008  . CAD 02/21/2008  . SLEEP APNEA 02/21/2008    General Behavior During Therapy: [facility] for tasks assessed/performed Cognition: [facility] for tasks performed   Lurena Nida, PTA/CLT 03/22/2013, 1:25 PM

## 2013-03-22 NOTE — Telephone Encounter (Signed)
Please advise.  I think patient is referring to pt

## 2013-03-26 ENCOUNTER — Ambulatory Visit (HOSPITAL_COMMUNITY)
Admission: RE | Admit: 2013-03-26 | Discharge: 2013-03-26 | Disposition: A | Discharge status: Home / Self Care | Payer: Medicare Other | Source: Ambulatory Visit | Attending: Family Medicine | Admitting: Family Medicine

## 2013-03-26 DIAGNOSIS — M6281 Muscle weakness (generalized): Secondary | ICD-10-CM | POA: Diagnosis not present

## 2013-03-26 DIAGNOSIS — R262 Difficulty in walking, not elsewhere classified: Secondary | ICD-10-CM | POA: Diagnosis not present

## 2013-03-26 DIAGNOSIS — I10 Essential (primary) hypertension: Secondary | ICD-10-CM | POA: Diagnosis not present

## 2013-03-26 DIAGNOSIS — IMO0001 Reserved for inherently not codable concepts without codable children: Secondary | ICD-10-CM | POA: Diagnosis not present

## 2013-03-26 DIAGNOSIS — R29898 Other symptoms and signs involving the musculoskeletal system: Secondary | ICD-10-CM

## 2013-03-26 NOTE — Evaluation (Signed)
Physical Therapy Re-Evaluation  Patient Details  Name: Craig Fields MRN: [id number] Date of Birth: 09-Aug-1941  Today's Date: 03/26/2013 Time: 1520-1600 PT Time Calculation (min): 40 min Charges PPT: 1520-1545 MMT: 4098-1191 Self Care: 1548-1600            Visit#: 8 of 16  Re-eval: 03/30/13 Assessment Diagnosis: LLE weakness Surgical Date: 01/01/13 Next MD Visit: Dr. Alanda Amass - unscheduled (cardiologist)  Authorization: Medicare    Authorization Time Period:    Authorization Visit#: 8 of 10   Subjective Symptoms/Limitations Symptoms: Pt reports everything has been getting easier overall.  This morning he reports he had to sit on the bed for 15-20 minutes to regain his balance before he could move.  Pain Assessment Currently in Pain?: No/denies  Precautions/Restrictions  Precautions Precautions: Fall  Balance Screening Balance Screen Has the patient fallen in the past 6 months: No Has the patient had a decrease in activity level because of a fear of falling? : Yes Is the patient reluctant to leave their home because of a fear of falling? : No  Prior Functioning  Home Living  Lives With: Family Prior Function Comments: Enjoys going to flea markets, walking at the grocery store.   Cognition/Observation Cognition Overall Cognitive Status: Within Functional Limits for tasks assessed Arousal/Alertness: Awake/alert Orientation Level: Oriented X4  Sensation/Coordination/Flexibility/Functional Tests Functional Tests Functional Tests: sit to stand without UE assist 20 INCHES ( WAS 21 inch surface)  RUE AROM (degrees) Right Shoulder Flexion:  (100/150) Right Shoulder ABduction:  (89/130) Right Shoulder Internal Rotation:  (27/37) Right Shoulder External Rotation:  (90/90) LLE Strength LLE Overall Strength Comments: Hip IR: (was 0/5, Hip ER: 2/5 Left Hip Flexion: 3/5 (was 3/5) Left Hip ABduction: 2+/5 (was 2+/5) Left Hip ADduction: 2+/5 (was 2+/5) Left Knee  Flexion: 2/5 (was 2/5) Left Knee Extension: 2+/5 (was 2/5)  Mobility/Balance  Ambulation/Gait Ambulation/Gait: Yes Ambulation Distance (Feet): 80 Feet (was 70 in 2 minutes) Assistive device: Small based quad cane Gait Pattern: Decreased hip/knee flexion - left;Left hip hike;Left circumduction Berg Balance Test Sit to Stand: Able to stand using hands after several tries Standing Unsupported: Able to stand safely 2 minutes Sitting with Back Unsupported but Feet Supported on Floor or Stool: Able to sit safely and securely 2 minutes Stand to Sit: Controls descent by using hands Transfers: Able to transfer safely, definite need of hands Standing Unsupported with Eyes Closed: Able to stand 10 seconds safely Standing Ubsupported with Feet Together: Able to place feet together independently and stand 1 minute safely From Standing, Reach Forward with Outstretched Arm: Can reach forward >12 cm safely (5") From Standing Position, Pick up Object from Floor: Able to pick up shoe, needs supervision From Standing Position, Turn to Look Behind Over each Shoulder: Looks behind one side only/other side shows less weight shift Turn 360 Degrees: Able to turn 360 degrees safely but slowly Standing Unsupported, Alternately Place Feet on Step/Stool: Needs assistance to keep from falling or unable to try Standing Unsupported, One Foot in Front: Able to take small step independently and hold 30 seconds Standing on One Leg: Unable to try or needs assist to prevent fall Total Score: 37 Timed Up and Go Test TUG: Normal TUG Normal TUG (seconds): 43 (was quad cane (was 65 asec with quad cane))   Exercise/Treatments Aerobic Elliptical: NuStep: 10 minutes, Hill #3, Resistance #4 for activity tolerance, avg SPM >1 Seated Other Seated Knee Exercises: Hip isometric adduction 10x10 sec holds Long Arc Quads 15 reps   Physical  Therapy Assessment and Plan PT Assessment and Plan Clinical Impression Statement: Mr.  Hounshell has attended 8 OP PT visits with the following findings: improved gait speed, mild improvement in dynamic balance, improved activity tolerance, non-adherence with HEP, reports today is a bad day overall and has been doing much better Encouraged pt to continue with HEP to maximize benefits. At this time pt would continue to benefit from skilled OP PT in order to continue to improve PLOF before cardiac surgery.  Pt will benefit from skilled therapeutic intervention in order to improve on the following deficits: Abnormal gait;Decreased strength;Decreased balance;Difficulty walking;Impaired perceived functional ability;Decreased activity tolerance;Decreased mobility PT Frequency: Min 2X/week PT Duration: 4 weeks PT Treatment/Interventions: Therapeutic exercise;Balance training;Therapeutic activities;Neuromuscular re-education;Functional mobility training;Manual techniques;Gait training;Patient/family education PT Plan: Continiue with funtional training (would like to get his Lt foot into truck with greater ease).  Continue to improve functional balance and strength.     Goals Home Exercise Program Pt/caregiver will Perform Home Exercise Program: Independently PT Short Term Goals Time to Complete Short Term Goals: 2 weeks PT Short Term Goal 1: Pt will improve his static standing balance and demonstrate partial tandem gait x15 seconds on BLE.  PT Short Term Goal 1 - Progress: Met PT Short Term Goal 2: Pt will improve his functional LE strength and go from sit to stand without UE assist from a 19 inch surface to decrease use of RUE to decrease pain to RUE (20 inch) PT Short Term Goal 2 - Progress: Progressing toward goal PT Long Term Goals Time to Complete Long Term Goals: 8 weeks PT Long Term Goal 1: Pt will improve his berg balance test to 37/56 to decrease risk of falls. PT Long Term Goal 1 - Progress: Met PT Long Term Goal 2: Pt will improve gait speed and compete the Tug in 45 seconds with  supervision and LRAD for improved safety in the community.  PT Long Term Goal 2 - Progress: Met Long Term Goal 3: Pt will improve his activity tolerance and complete 140 feet in 2 minutes with supervision with LRAD in order to return to community mobility.  Long Term Goal 3 Progress: Progressing toward goal  Problem List Patient Active Problem List   Diagnosis Date Noted  . Left leg weakness 02/28/2013  . Difficulty in walking(719.7) 02/28/2013  . Cellulitis 12/07/2012  . Skin lesion 12/07/2012  . Routine general medical examination at a health care facility 07/30/2012  . Ulcer of leg, chronic, right 11/16/2011  . Skin tag 11/16/2011  . Prediabetes 07/19/2011  . BACK PAIN 07/26/2010  . 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 COLON CANCER 02/21/2008  . HYPERLIPIDEMIA 02/21/2008  . OBESITY 02/21/2008  . Unspecified essential hypertension 02/21/2008  . CAD 02/21/2008  . SLEEP APNEA 02/21/2008   General Behavior During Therapy: [facility] for tasks assessed/performed Cognition: [facility] for tasks performed PT Plan of Care PT Patient Instructions: encouraged to continue with seated HEP Consulted and Agree with Plan of Care: Patient  Annett Fabian, MPT, ATC 03/26/2013, 5:50 PM  Physician Documentation Your signature is required to indicate approval of the treatment plan as stated above.  Please sign and either send electronically or make a copy of this report for your files and return this physician signed original.   Please mark one 1.__approve of plan  2. ___approve of plan with the following conditions.   ______________________________  _____________________ Physician Signature                                                                                                             Date

## 2013-03-27 ENCOUNTER — Other Ambulatory Visit (HOSPITAL_COMMUNITY): Payer: Medicare Other

## 2013-03-27 DIAGNOSIS — M25519 Pain in unspecified shoulder: Secondary | ICD-10-CM | POA: Insufficient documentation

## 2013-03-27 DIAGNOSIS — I739 Peripheral vascular disease, unspecified: Secondary | ICD-10-CM | POA: Diagnosis not present

## 2013-03-27 DIAGNOSIS — M6281 Muscle weakness (generalized): Secondary | ICD-10-CM | POA: Insufficient documentation

## 2013-03-27 NOTE — Evaluation (Signed)
Occupational Therapy Evaluation  Patient Details  Name: Craig Fields MRN: [id number] Date of Birth: 1941/03/18  Today's Date: 03/27/2013 Time: 1441-1510 OT Time Calculation (min): 29 min OT eval 1441-1510 29'  Visit#: 1 of 8  Re-eval: 04/23/13     Authorization: Medicare  Authorization Time Period: before 10th visit  Authorization Visit#: 1 of 10   Past Medical History:  Past Medical History  Diagnosis Date  . IGT (impaired glucose tolerance)   . Anxiety disorder   . Obesity   . CAD (coronary artery disease)   . Hypertension   . Sleep apnea   . Hyperlipidemia   . CVA, old, hemiparesis     left side   . 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 Colon cancer 2007/05  . Seizures     at age 26 or 71. No meds and no reoccurances.  . Chronic back pain   . Anxiety     anxiety attacks at times   Past Surgical History:  Past Surgical History  Procedure Laterality Date  . Left inguinal  hernia repair  2005  . [name]  2007  . Coronary artery bypass graft  2004    x4  . Incisional hernia repair N/A 01/01/2013    Procedure: HERNIA REPAIR INCISIONAL;  Surgeon: Craig Heading, MD;  Location: [facility];  Service: General;  Laterality: N/A;  umbilical area  . Insertion of mesh N/A 01/01/2013    Procedure: INSERTION OF MESH;  Surgeon: Craig Heading, MD;  Location: [facility];  Service: General;  Laterality: N/A;    Subjective Symptoms/Limitations Symptoms: S: I believe I have a tear in my shoulder. They didn't want to do surgery because of my left arm. The docotor knew I only had one good arm.  Pertinent History: Patient is a 72 y/o male presenting to outpatient OT with RUE pain and weakness. Patient believes that he has a torn ligament and his doctor opted to not perform surgery due to decreased function of LUE sustained from a stroke 11 years ago. Dr. Alanda Fields has referred patient to occupational therapy for evaluation and treatment.  Patient Stated Goals: To  gain some use back from arm.  Pain Assessment Currently in Pain?: No/denies  Precautions/Restrictions  Precautions Precautions: Fall  Balance Screening Balance Screen Has the patient fallen in the past 6 months: No Has the patient had a decrease in activity level because of a fear of falling? : Yes Is the patient reluctant to leave their home because of a fear of falling? : No   Assessment  03/26/13 1400  Assessment  Diagnosis Rt. side weakness and pain  Precautions  Precautions Fall  Balance Screen  Has the patient fallen in the past 6 months No  Has the patient had a decrease in activity level because of a fear of falling?  Yes  Is the patient reluctant to leave their home because of a fear of falling?  No  Home Living  Lives With Family  Prior Function  Comments Enjoys going to flea markets, walking at the grocery store.   Vision - History  Baseline Vision Wears glasses all the time  Cognition  Overall Cognitive Status Within Functional Limits for tasks assessed  Arousal/Alertness Awake/alert  Orientation Level Oriented X4  RUE AROM (degrees)  Right Shoulder Flexion (100/150)  Right Shoulder ABduction (89/130)  Right Shoulder Internal Rotation (27/37)  Right Shoulder External Rotation (90/90)  RUE Overall AROM Comments Assessed seated  and supine. IR/ER ADD  RUE Strength  Right Shoulder Flexion 2+/5  Right Shoulder ABduction 2+/5  Right Shoulder Internal Rotation 3/5  Right Shoulder External Rotation 3-/5  Palpation  Palpation max fascial restrictions Right upper arm, trapezius, and scapularis region.   Written Expression  Dominant Hand Right    Occupational Therapy Assessment and Plan OT Assessment and Plan Clinical Impression Statement: A: Patient presents with increased fasical restrictions in right shoulder region, joint instablity, and increased pain, which is causing decreased independence with daily activities due to decreased functional use of his right  arm.   Pt will benefit from skilled therapeutic intervention in order to improve on the following deficits: Impaired UE functional use;Increased fascial restricitons;Decreased range of motion;Pain;Decreased strength Rehab Potential: Good OT Frequency: Min 2X/week OT Duration: 4 weeks OT Treatment/Interventions: Self-care/ADL training;Therapeutic activities;Therapeutic exercise;DME and/or AE instruction;Manual therapy;Modalities;Patient/family education OT Plan: P:  Skilled OT intervention to decrease pain, restrictions, and joint instability while improving AROM and strength needed to return to full use of RUE with all daily activities.  Treatment Plan:  MFR and manual stretching to right trapezius, SCM, and rhomboid to decrease pain and fascial restrictions. Therapeutic exercises to build proximal shoulder stability, x to v and w arms, tband for shoulder stability, UBE in reverse.  AVOID combining ER/IR and abduction. PROM supine to start. Progress to therapist assisted AAROM  supine then seated. Progress as tolerated.    Goals Short Term Goals Time to Complete Short Term Goals: 2 weeks Short Term Goal 1: Patient will be educated on HEP.  Short Term Goal 2: Patient will decrease his right shoulder to 3/10 when performing daily activities. Short Term Goal 3: Patient will decrease fascial restrictions to mod in his right shoulder region. Short Term Goal 4: Patient will improve right shoulder joint stability from fair to good - for increased ability to maintain correct posture when reaching overhead into cabinets. Short Term Goal 5: Patient will increase RUE AROM to WNL to be able to reach into overhead cabinets with less difficulty.  Long Term Goals Time to Complete Long Term Goals: 4 weeks Long Term Goal 1: Patient will return to highest level of independence while using RUE for daily and leisure acitivities.  Long Term Goal 2: Patient will decrease his right shoulder to 1/10 when performing daily  activities. Long Term Goal 3: Patient will decrease fascial restrictions to min in his right shoulder region. Long Term Goal 4: Patient will improve right shoulder joint stability from good- to good for increased ability to maintain correct posture when reaching overhead into cabinets. Long Term Goal 5: Patient will increase RUE strength to 4-/5 to be able to lift dishes from countertop to overhead cabinet.  Problem List Patient Active Problem List   Diagnosis Date Noted  . Pain in joint, shoulder region 03/27/2013  . Muscle weakness (generalized) 03/27/2013  . Left leg weakness 02/28/2013  . Difficulty in walking(719.7) 02/28/2013  . Cellulitis 12/07/2012  . Skin lesion 12/07/2012  . Routine general medical examination at a health care facility 07/30/2012  . Ulcer of leg, chronic, right 11/16/2011  . Skin tag 11/16/2011  . Prediabetes 07/19/2011  . BACK PAIN 07/26/2010  . 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 COLON CANCER 02/21/2008  . HYPERLIPIDEMIA 02/21/2008  . OBESITY 02/21/2008  . Unspecified essential hypertension 02/21/2008  . CAD 02/21/2008  . SLEEP APNEA 02/21/2008    End of Session Activity Tolerance: Patient tolerated treatment well General Behavior During Therapy: [facility] for tasks  assessed/performed Cognition: WFL for tasks performed OT Plan of Care OT Home Exercise Plan: towel slides OT Patient Instructions: handout- scanned Consulted and Agree with Plan of Care: Patient  GO Functional Assessment Tool Used: Clinical observation Functional Limitation: Carrying, moving and handling objects Carrying, Moving and Handling Objects Current Status (Z6109): At least 40 percent but less than 60 percent impaired, limited or restricted Carrying, Moving and Handling Objects Goal Status [id number]): At least 20 percent but less than 40 percent impaired, limited or restricted  Limmie Patricia, OTR/L,CBIS   03/27/2013, 7:27 AM  Physician Documentation Your signature is  required to indicate approval of the treatment plan as stated above.  Please sign and either send electronically or make a copy of this report for your files and return this physician signed original.  Please mark one 1.__approve of plan  2. ___approve of plan with the following conditions.   ______________________________                                                          _____________________ Physician Signature                                                                                                             Date

## 2013-03-28 ENCOUNTER — Ambulatory Visit (HOSPITAL_COMMUNITY)
Admission: RE | Admit: 2013-03-28 | Discharge: 2013-03-28 | Disposition: A | Discharge status: Home / Self Care | Payer: Medicare Other | Source: Ambulatory Visit | Attending: Family Medicine | Admitting: Family Medicine

## 2013-03-28 DIAGNOSIS — R262 Difficulty in walking, not elsewhere classified: Secondary | ICD-10-CM | POA: Diagnosis not present

## 2013-03-28 DIAGNOSIS — M6281 Muscle weakness (generalized): Secondary | ICD-10-CM | POA: Diagnosis not present

## 2013-03-28 DIAGNOSIS — R29898 Other symptoms and signs involving the musculoskeletal system: Secondary | ICD-10-CM

## 2013-03-28 DIAGNOSIS — I10 Essential (primary) hypertension: Secondary | ICD-10-CM | POA: Diagnosis not present

## 2013-03-28 DIAGNOSIS — IMO0001 Reserved for inherently not codable concepts without codable children: Secondary | ICD-10-CM | POA: Diagnosis not present

## 2013-03-28 NOTE — Progress Notes (Signed)
Physical Therapy Treatment Patient Details  Name: Craig Fields MRN: [id number] Date of Birth: 11-09-1940  Today's Date: 03/28/2013 Time: 7829-5621 PT Time Calculation (min): 38 min Charge: TE 61'  Visit#: 9 of 10  Re-eval: 04/23/13    Authorization: Medicare  Authorization Time Period:    Authorization Visit#: 9 of 10   Subjective: Symptoms/Limitations Symptoms: Pt reports he has his good and bad days, everything seems to be getting easier.  Continues to get tired with lots of activites. Pain Assessment Currently in Pain?: No/denies  Precautions/Restrictions  Precautions Precautions: Fall  Exercise/Treatments Stretches Passive Hamstring Stretch: 3 reps;30 seconds Standing Lateral Step Up: Left;15 reps;Hand Hold: 0;Step Height: 2";Other (comment) (w/ QC) Gait Training: Cueing to improve mechanics and sequencing with cane through out session Seated Long Arc Quad: Both;15 reps Long Arc Quad Limitations: 3# RLE, 1# LLE Other Seated Knee Exercises: Hip isometric adduction 10x10 sec holds Other Seated Knee Exercises: AAROM hip flexion 5x  Supine Other Supine Knee Exercises: Passive IR stretch for Lt LE 3x 30"   Balance Exercises Standing Retro Gait: 2 reps;Limitations Retro Gait Limitations: min A with QC Sidestepping: 2 reps;Limitations Sidestepping Limitations: mod A   Physical Therapy Assessment and Plan PT Assessment and Plan Clinical Impression Statement: Session focus on functional strengthening activities to increase ease with getting in and out of vehicle.  Pt continues to have weak Lt LE musculature required AAROM with hip flexion and cueing to reduce compensation with seated exercises today.   Pt will benefit from skilled therapeutic intervention in order to improve on the following deficits: Impaired UE functional use;Increased fascial restricitons;Decreased range of motion;Pain;Decreased strength PT Plan: Gcode due next session.  Continiue with funtional  training (would like to get his Lt foot into truck with greater ease).  Continue to improve functional balance and strength.     Goals    Problem List Patient Active Problem List   Diagnosis Date Noted  . Pain in joint, shoulder region 03/27/2013  . Muscle weakness (generalized) 03/27/2013  . Left leg weakness 02/28/2013  . Difficulty in walking(719.7) 02/28/2013  . Cellulitis 12/07/2012  . Skin lesion 12/07/2012  . Routine general medical examination at a health care facility 07/30/2012  . Ulcer of leg, chronic, right 11/16/2011  . Skin tag 11/16/2011  . Prediabetes 07/19/2011  . BACK PAIN 07/26/2010  . 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 COLON CANCER 02/21/2008  . HYPERLIPIDEMIA 02/21/2008  . OBESITY 02/21/2008  . Unspecified essential hypertension 02/21/2008  . CAD 02/21/2008  . SLEEP APNEA 02/21/2008    PT - End of Session Equipment Utilized During Treatment: Gait belt Activity Tolerance: Patient limited by fatigue;Patient tolerated treatment well General Behavior During Therapy: [facility] for tasks assessed/performed Cognition: WFL for tasks performed  GP    [name] 03/28/2013, 4:57 PM

## 2013-03-28 NOTE — Progress Notes (Addendum)
Occupational Therapy Treatment Patient Details  Name: Craig Fields MRN: [id number] Date of Birth: 12/31/1940  Today's Date: 03/28/2013 Time: 4098-1191 OT Time Calculation (min): 35 min MFR 4782-9562 13' Therex 1308-6578 22'  Visit#: 2 of 8  Re-eval: 04/23/13    Authorization: Medicare  Authorization Time Period: before 10th visit  Authorization Visit#: 2 of 10  Subjective Symptoms/Limitations Symptoms: S: I tried those exercises you gave me on the table top. I have to clear more room on my table. Pain Assessment Currently in Pain?: No/denies  Precautions/Restrictions  Precautions Precautions: Fall  Exercise/Treatments Supine Protraction: PROM;10 reps Horizontal ABduction: PROM;10 reps External Rotation: PROM;10 reps Internal Rotation: PROM;10 reps Flexion: PROM;10 reps ABduction: PROM;10 reps Seated Elevation: AROM;Right;10 reps Extension: AROM;Right;10 reps Row: AROM;Right;10 reps Therapy Ball Flexion: 15 reps ABduction: 15 reps ROM / Strengthening / Isometric Strengthening Thumb Tacks: 30"; right Proximal Shoulder Strengthening, Supine: 10x; right Prot/Ret//Elev/Dep: 1'; right; tactile cues for technique      Manual Therapy Manual Therapy: Myofascial release Myofascial Release: MFR and manual stretching to right upper arm, trapezius, and scapularis region to decrease fascial restrictions and increase joint mobility in a pain free zone.  Occupational Therapy Assessment and Plan OT Assessment and Plan Clinical Impression Statement: A: Patient only able to perform 30" of thumb tacks due to shoulder fatigue. Tactile cues needed for pro/ret/dep/elev for technique. Pt will benefit from skilled therapeutic intervention in order to improve on the following deficits: Impaired UE functional use;Increased fascial restricitons;Decreased range of motion;Pain;Decreased strength OT Plan: P:  Increase time on thumb tacks. Perform pro/ret/dep/elev independently without  constant cueing.    Goals Short Term Goals Time to Complete Short Term Goals: 2 weeks Short Term Goal 1: Patient will be educated on HEP.  Short Term Goal 1 Progress: Progressing toward goal Short Term Goal 2: Patient will decrease his right shoulder to 3/10 when performing daily activities. Short Term Goal 2 Progress: Progressing toward goal Short Term Goal 3: Patient will decrease fascial restrictions to mod in his right shoulder region. Short Term Goal 3 Progress: Progressing toward goal Short Term Goal 4: Patient will improve right shoulder joint stability from fair to good - for increased ability to maintain correct posture when reaching overhead into cabinets. Short Term Goal 4 Progress: Progressing toward goal Short Term Goal 5: Patient will increase RUE AROM to WNL to be able to reach into overhead cabinets with less difficulty.  Short Term Goal 5 Progress: Progressing toward goal Long Term Goals Time to Complete Long Term Goals: 4 weeks Long Term Goal 1: Patient will return to highest level of independence while using RUE for daily and leisure acitivities.  Long Term Goal 1 Progress: Progressing toward goal Long Term Goal 2: Patient will decrease his right shoulder to 1/10 when performing daily activities. Long Term Goal 2 Progress: Progressing toward goal Long Term Goal 3: Patient will decrease fascial restrictions to min in his right shoulder region. Long Term Goal 3 Progress: Progressing toward goal Long Term Goal 4: Patient will improve right shoulder joint stability from good- to good for increased ability to maintain correct posture when reaching overhead into cabinets. Long Term Goal 4 Progress: Progressing toward goal Long Term Goal 5: Patient will increase RUE strength to 4-/5 to be able to lift dishes from countertop to overhead cabinet. Long Term Goal 5 Progress: Progressing toward goal  Problem List Patient Active Problem List   Diagnosis Date Noted  . Pain in  joint, shoulder region 03/27/2013  .  Muscle weakness (generalized) 03/27/2013  . Left leg weakness 02/28/2013  . Difficulty in walking(719.7) 02/28/2013  . Cellulitis 12/07/2012  . Skin lesion 12/07/2012  . Routine general medical examination at a health care facility 07/30/2012  . Ulcer of leg, chronic, right 11/16/2011  . Skin tag 11/16/2011  . Prediabetes 07/19/2011  . BACK PAIN 07/26/2010  . 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 COLON CANCER 02/21/2008  . HYPERLIPIDEMIA 02/21/2008  . OBESITY 02/21/2008  . Unspecified essential hypertension 02/21/2008  . CAD 02/21/2008  . SLEEP APNEA 02/21/2008    End of Session Activity Tolerance: Patient tolerated treatment well General Behavior During Therapy: [facility] for tasks assessed/performed Cognition: [facility] for tasks performed   Limmie Patricia, OTR/L,CBIS   03/28/2013, 4:23 PM

## 2013-03-29 ENCOUNTER — Ambulatory Visit (HOSPITAL_COMMUNITY)
Admission: RE | Admit: 2013-03-29 | Discharge: 2013-03-29 | Disposition: A | Discharge status: Home / Self Care | Payer: Medicare Other | Source: Ambulatory Visit | Attending: Cardiovascular Disease | Admitting: Cardiovascular Disease

## 2013-03-29 DIAGNOSIS — I359 Nonrheumatic aortic valve disorder, unspecified: Secondary | ICD-10-CM | POA: Diagnosis not present

## 2013-03-29 DIAGNOSIS — Z6836 Body mass index (BMI) 36.0-36.9, adult: Secondary | ICD-10-CM | POA: Insufficient documentation

## 2013-03-29 DIAGNOSIS — R0602 Shortness of breath: Secondary | ICD-10-CM | POA: Diagnosis not present

## 2013-03-29 DIAGNOSIS — I10 Essential (primary) hypertension: Secondary | ICD-10-CM | POA: Diagnosis not present

## 2013-03-29 DIAGNOSIS — R011 Cardiac murmur, unspecified: Secondary | ICD-10-CM | POA: Diagnosis not present

## 2013-03-29 NOTE — Progress Notes (Signed)
*  PRELIMINARY RESULTS* Echocardiogram 2D Echocardiogram has been performed.  [place] 03/29/2013, 9:21 AM

## 2013-04-02 ENCOUNTER — Ambulatory Visit (HOSPITAL_COMMUNITY)
Admission: RE | Admit: 2013-04-02 | Discharge: 2013-04-02 | Disposition: A | Discharge status: Home / Self Care | Payer: Medicare Other | Source: Ambulatory Visit | Attending: Cardiovascular Disease | Admitting: Cardiovascular Disease

## 2013-04-02 DIAGNOSIS — I10 Essential (primary) hypertension: Secondary | ICD-10-CM | POA: Diagnosis not present

## 2013-04-02 DIAGNOSIS — M6281 Muscle weakness (generalized): Secondary | ICD-10-CM | POA: Diagnosis not present

## 2013-04-02 DIAGNOSIS — R29898 Other symptoms and signs involving the musculoskeletal system: Secondary | ICD-10-CM

## 2013-04-02 DIAGNOSIS — IMO0001 Reserved for inherently not codable concepts without codable children: Secondary | ICD-10-CM | POA: Diagnosis not present

## 2013-04-02 DIAGNOSIS — R262 Difficulty in walking, not elsewhere classified: Secondary | ICD-10-CM | POA: Diagnosis not present

## 2013-04-02 NOTE — Progress Notes (Addendum)
Physical Therapy Treatment Patient Details  Name: Craig Fields MRN: [id number] Date of Birth: 01-14-1941  Today's Date: 04/02/2013 Time: 1037-1115 PT Time Calculation (min): 38 min Charges TA: 4098-1191 Visit#: 10 of 16  Re-eval: 04/23/13    Authorization: Medicare  Authorization Time Period:    Authorization Visit#: 10 of 20   Subjective: Symptoms/Limitations Symptoms: I am still getting really dizzy in the morning.  Pt also reports that he is still having most difficulty getting his foot into his truck. Overall feels he has improved at least 50% if not more.  he still has bad days and good days.  Pain Assessment Currently in Pain?: No/denies  Precautions/Restrictions     Exercise/Treatments Standing Heel Raises: 15 reps Knee Flexion: Left;10 reps;Limitations Knee Flexion Limitations: max A Functional Squat: 20 reps Gait Training: 14 minutes indoor and outdoor surfaces w/1 standing rest break Standing Sit to Stand: Standard surface;Limitations Sit to Stand Limitations: 10 reps min A 20 in Other Standing Exercises: Activity to demonstrate stepping into his truck with LLE using 4in and 6 in step and then at end of session at his truck x12 minutes   Physical Therapy Assessment and Plan PT Assessment and Plan Clinical Impression Statement: G-code updated. Treatment focused on improving IADL activities including getting up from a lowered surface, walking with improved speed and getting into and out of his truck.  Pt truck is 16 inches off ground and continues to require mod assist to get foot into truck. Pt has improved actiivity tolerance and able to ambulate x14 minutes with min guard A in crowded environment.  Did not have any complaints of dizziness during therapy.  PT Plan: Continiue with funtional training (would like to get his Lt foot into truck with greater ease).  Continue to improve functional balance and strength.     Goals Home Exercise Program Pt/caregiver will  Perform Home Exercise Program: Independently PT Goal: Perform Home Exercise Program - Progress: Met PT Short Term Goals Time to Complete Short Term Goals: 2 weeks PT Short Term Goal 1: Pt will improve his static standing balance and demonstrate partial tandem gait x15 seconds on BLE.  PT Short Term Goal 1 - Progress: Met PT Short Term Goal 2: Pt will improve his functional LE strength and go from sit to stand without UE assist from a 19 inch surface to decrease use of RUE to decrease pain to RUE (20 inch) PT Short Term Goal 2 - Progress: Progressing toward goal PT Long Term Goals Time to Complete Long Term Goals: 8 weeks PT Long Term Goal 1: Pt will improve his berg balance test to 37/56 to decrease risk of falls. PT Long Term Goal 1 - Progress: Met PT Long Term Goal 2: Pt will improve gait speed and compete the Tug in 45 seconds with supervision and LRAD for improved safety in the community.  PT Long Term Goal 2 - Progress: Met Long Term Goal 3: Pt will improve his activity tolerance and complete 140 feet in 2 minutes with supervision with LRAD in order to return to community mobility.  Long Term Goal 3 Progress: Progressing toward goal  Problem List Patient Active Problem List   Diagnosis Date Noted  . Pain in joint, shoulder region 03/27/2013  . Muscle weakness (generalized) 03/27/2013  . Left leg weakness 02/28/2013  . Difficulty in walking(719.7) 02/28/2013  . Cellulitis 12/07/2012  . Skin lesion 12/07/2012  . Routine general medical examination at a health care facility 07/30/2012  . Ulcer of  leg, chronic, right 11/16/2011  . Skin tag 11/16/2011  . Prediabetes 07/19/2011  . BACK PAIN 07/26/2010  . 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 COLON CANCER 02/21/2008  . HYPERLIPIDEMIA 02/21/2008  . OBESITY 02/21/2008  . Unspecified essential hypertension 02/21/2008  . CAD 02/21/2008  . SLEEP APNEA 02/21/2008    PT - End of Session Equipment Utilized During Treatment: Gait  belt Activity Tolerance: Patient limited by fatigue;Patient tolerated treatment well General Behavior During Therapy: [facility] for tasks assessed/performed Cognition: WFL for tasks performed  GP Functional Assessment Tool Used: clinical observation, self report (feels like he is at least 50% better) Functional Limitation: Mobility: Walking and moving around Mobility: Walking and Moving Around Current Status ([id number]): At least 40 percent but less than 60 percent impaired, limited or restricted Mobility: Walking and Moving Around Goal Status [phone]): At least 20 percent but less than 40 percent impaired, limited or restricted  Legion Discher, MPT, ATC 04/02/2013, 11:21 AM

## 2013-04-04 ENCOUNTER — Ambulatory Visit (HOSPITAL_COMMUNITY)
Admission: RE | Admit: 2013-04-04 | Discharge: 2013-04-04 | Disposition: A | Discharge status: Home / Self Care | Payer: Medicare Other | Source: Ambulatory Visit | Attending: Cardiovascular Disease | Admitting: Cardiovascular Disease

## 2013-04-04 DIAGNOSIS — I10 Essential (primary) hypertension: Secondary | ICD-10-CM | POA: Diagnosis not present

## 2013-04-04 DIAGNOSIS — IMO0001 Reserved for inherently not codable concepts without codable children: Secondary | ICD-10-CM | POA: Diagnosis not present

## 2013-04-04 DIAGNOSIS — M6281 Muscle weakness (generalized): Secondary | ICD-10-CM | POA: Diagnosis not present

## 2013-04-04 DIAGNOSIS — R262 Difficulty in walking, not elsewhere classified: Secondary | ICD-10-CM | POA: Diagnosis not present

## 2013-04-04 NOTE — Progress Notes (Signed)
Physical Therapy Treatment Patient Details  Name: Craig Fields MRN: [id number] Date of Birth: [birth year]  Today's Date: 04/04/2013 Time: 0938-1020 PT Time Calculation (min): 42 min  Visit#: 11 of 16  Re-eval: 04/23/13 Authorization: Medicare  Authorization Visit#: 11 of 20  Charges:  therex [phone] (24), gait 1004-1020 (16)  Subjective: Symptoms/Limitations Symptoms: Pt states he's been working on picking up that Lt LE better when walking.  States he's having a pretty good day today. Pain Assessment Currently in Pain?: No/denies  Exercise/Treatments Standing Heel Raises: 15 reps Knee Flexion Limitations: unable Functional Squat: 20 reps Gait Training: 15 minutes indoor and outdoor surfaces w/1 standing rest break Other Standing Knee Exercises: Lt toe tap onto 6" step 10X Seated Other Seated Knee Exercises: sit to stand without UE's from chair 10X Other Seated Knee Exercises: AAROM hip flexion 5x     Physical Therapy Assessment and Plan PT Assessment and Plan PT Assessment:  Continued focus with improving pt. Functional mobility.  Added 6" step up with Lt LE to progress to increased height of that simulating getting to truck (pt. Leads in with Lt LE).    Pt with overall good ability with outdoor ambulation, however requires cues to lift LLE higher when stepping through grass and down ramps. PT Plan: Continue with funtional training (would like to get his Lt foot into truck with greater ease).  Continue to improve functional balance and strength.      Problem List Patient Active Problem List   Diagnosis Date Noted  . Pain in joint, shoulder region 03/27/2013  . Muscle weakness (generalized) 03/27/2013  . Left leg weakness 02/28/2013  . Difficulty in walking(719.7) 02/28/2013  . Cellulitis 12/07/2012  . Skin lesion 12/07/2012  . Routine general medical examination at a health care facility 07/30/2012  . Ulcer of leg, chronic, right 11/16/2011  . Skin tag 11/16/2011  .  Prediabetes 07/19/2011  . BACK PAIN 07/26/2010  . 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 COLON CANCER 02/21/2008  . HYPERLIPIDEMIA 02/21/2008  . OBESITY 02/21/2008  . Unspecified essential hypertension 02/21/2008  . CAD 02/21/2008  . SLEEP APNEA 02/21/2008    PT - End of Session Equipment Utilized During Treatment: Gait belt Activity Tolerance: Patient limited by fatigue;Patient tolerated treatment well General Behavior During Therapy: [facility] for tasks assessed/performed Cognition: WFL for tasks performed   Lurena Nida, PTA/CLT 04/04/2013, 1:53 PM

## 2013-04-09 ENCOUNTER — Inpatient Hospital Stay (HOSPITAL_COMMUNITY): Admission: RE | Admit: 2013-04-09 | Payer: Medicare Other | Source: Ambulatory Visit

## 2013-04-09 ENCOUNTER — Telehealth (HOSPITAL_COMMUNITY): Payer: Self-pay

## 2013-04-11 ENCOUNTER — Ambulatory Visit (HOSPITAL_COMMUNITY): Payer: Medicare Other

## 2013-04-11 ENCOUNTER — Other Ambulatory Visit: Payer: Self-pay | Admitting: Family Medicine

## 2013-04-11 ENCOUNTER — Telehealth (HOSPITAL_COMMUNITY): Payer: Self-pay

## 2013-04-16 ENCOUNTER — Ambulatory Visit (HOSPITAL_COMMUNITY)
Admission: RE | Admit: 2013-04-16 | Discharge: 2013-04-16 | Disposition: A | Discharge status: Home / Self Care | Payer: Medicare Other | Source: Ambulatory Visit | Attending: Cardiovascular Disease | Admitting: Cardiovascular Disease

## 2013-04-16 DIAGNOSIS — M6281 Muscle weakness (generalized): Secondary | ICD-10-CM | POA: Insufficient documentation

## 2013-04-16 DIAGNOSIS — IMO0001 Reserved for inherently not codable concepts without codable children: Secondary | ICD-10-CM | POA: Diagnosis not present

## 2013-04-16 DIAGNOSIS — I10 Essential (primary) hypertension: Secondary | ICD-10-CM | POA: Diagnosis not present

## 2013-04-16 DIAGNOSIS — R262 Difficulty in walking, not elsewhere classified: Secondary | ICD-10-CM | POA: Insufficient documentation

## 2013-04-16 NOTE — Progress Notes (Signed)
Occupational Therapy Treatment Patient Details  Name: Craig Fields MRN: [id number] Date of Birth: 29-Dec-1940  Today's Date: 04/16/2013 Time: 1478-2956 OT Time Calculation (min): 39 min MFR 2130-8657 8' Therex 8469-6295 31'  Visit#: 3 of 8  Re-eval: 04/23/13    Authorization: Medicare  Authorization Time Period: before 10th visit  Authorization Visit#: 3 of 10  Subjective Symptoms/Limitations Symptoms: S: My shoulder only hurts if I move it the wrong way.  Pain Assessment Currently in Pain?: No/denies  Precautions/Restrictions  Precautions Precautions: Fall  Exercise/Treatments Supine Protraction: PROM;AAROM;10 reps Horizontal ABduction: PROM;AAROM;10 reps External Rotation: PROM;AAROM;10 reps Internal Rotation: PROM;AAROM;10 reps Flexion: PROM;AAROM;10 reps ABduction: PROM;AAROM;10 reps Seated Elevation: AROM;Right;10 reps Extension: AROM;Right;10 reps Row: AROM;Right;10 reps Therapy Ball Flexion: 15 reps ABduction: 15 reps ROM / Strengthening / Isometric Strengthening Thumb Tacks: 40' right Prot/Ret//Elev/Dep: 1'; right       Manual Therapy Manual Therapy: Myofascial release Myofascial Release: MFR and manual stretching to right upper arm, trapezius, and scapularis region to decrease fascial restrictions and increase joint mobility in a pain free zone.  Occupational Therapy Assessment and Plan OT Assessment and Plan Clinical Impression Statement: A: Increased time on thumb tacks to 81' . Added AAROM supine with therapist assisting. Tolerated well.  OT Plan: P: Add pulleys   Goals Short Term Goals Time to Complete Short Term Goals: 2 weeks Short Term Goal 1: Patient will be educated on HEP.  Short Term Goal 1 Progress: Progressing toward goal Short Term Goal 2: Patient will decrease his right shoulder to 3/10 when performing daily activities. Short Term Goal 2 Progress: Progressing toward goal Short Term Goal 3: Patient will decrease fascial  restrictions to mod in his right shoulder region. Short Term Goal 3 Progress: Progressing toward goal Short Term Goal 4: Patient will improve right shoulder joint stability from fair to good - for increased ability to maintain correct posture when reaching overhead into cabinets. Short Term Goal 4 Progress: Progressing toward goal Short Term Goal 5: Patient will increase RUE AROM to WNL to be able to reach into overhead cabinets with less difficulty.  Short Term Goal 5 Progress: Progressing toward goal Long Term Goals Time to Complete Long Term Goals: 4 weeks Long Term Goal 1: Patient will return to highest level of independence while using RUE for daily and leisure acitivities.  Long Term Goal 1 Progress: Progressing toward goal Long Term Goal 2: Patient will decrease his right shoulder to 1/10 when performing daily activities. Long Term Goal 2 Progress: Progressing toward goal Long Term Goal 3: Patient will decrease fascial restrictions to min in his right shoulder region. Long Term Goal 3 Progress: Progressing toward goal Long Term Goal 4: Patient will improve right shoulder joint stability from good- to good for increased ability to maintain correct posture when reaching overhead into cabinets. Long Term Goal 4 Progress: Progressing toward goal Long Term Goal 5: Patient will increase RUE strength to 4-/5 to be able to lift dishes from countertop to overhead cabinet. Long Term Goal 5 Progress: Progressing toward goal  Problem List Patient Active Problem List   Diagnosis Date Noted  . Pain in joint, shoulder region 03/27/2013  . Muscle weakness (generalized) 03/27/2013  . Left leg weakness 02/28/2013  . Difficulty in walking(719.7) 02/28/2013  . Cellulitis 12/07/2012  . Skin lesion 12/07/2012  . Routine general medical examination at a health care facility 07/30/2012  . Ulcer of leg, chronic, right 11/16/2011  . Skin tag 11/16/2011  . Prediabetes 07/19/2011  . BACK  PAIN 07/26/2010   . 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 COLON CANCER 02/21/2008  . HYPERLIPIDEMIA 02/21/2008  . OBESITY 02/21/2008  . Unspecified essential hypertension 02/21/2008  . CAD 02/21/2008  . SLEEP APNEA 02/21/2008    End of Session Activity Tolerance: Patient tolerated treatment well General Behavior During Therapy: [facility] for tasks assessed/performed Cognition: [facility] for tasks performed   Limmie Patricia, OTR/L,CBIS   04/16/2013, 1:49 PM

## 2013-04-18 ENCOUNTER — Ambulatory Visit (HOSPITAL_COMMUNITY)
Admission: RE | Admit: 2013-04-18 | Discharge: 2013-04-18 | Disposition: A | Discharge status: Home / Self Care | Payer: Medicare Other | Source: Ambulatory Visit | Attending: Cardiovascular Disease | Admitting: Cardiovascular Disease

## 2013-04-18 DIAGNOSIS — R29898 Other symptoms and signs involving the musculoskeletal system: Secondary | ICD-10-CM

## 2013-04-18 DIAGNOSIS — R262 Difficulty in walking, not elsewhere classified: Secondary | ICD-10-CM

## 2013-04-18 NOTE — Progress Notes (Signed)
Occupational Therapy Treatment Patient Details  Name: Craig Fields MRN: [id number] Date of Birth: February 18, 1941  Today's Date: 04/18/2013 Time: 1310-1345 OT Time Calculation (min): 35 min MFR 1310-1320 10' Therex 0865-7846 25'  Visit#: 4 of 8  Re-eval: 04/23/13    Authorization: Medicare  Authorization Time Period: before 10th visit  Authorization Visit#: 4 of 10  Subjective Symptoms/Limitations Symptoms: S: My shoulder was a little sore after our last session. Pain Assessment Currently in Pain?: No/denies  Precautions/Restrictions  Precautions Precautions: Fall  Exercise/Treatments Supine Protraction: PROM;AROM;10 reps Horizontal ABduction: PROM;AAROM;10 reps External Rotation: PROM;AROM;10 reps Internal Rotation: PROM;AROM;10 reps Flexion: PROM;AAROM;10 reps ABduction: PROM;AAROM;10 reps Seated Elevation: AROM;Right;10 reps Extension: AROM;Right;10 reps Row: AROM;Right;10 reps Pulleys Flexion: 1 minute ABduction: 1 minute ROM / Strengthening / Isometric Strengthening Thumb Tacks: 1' right      Manual Therapy Manual Therapy: Myofascial release Myofascial Release: MFR and manual stretching to right upper arm, trapezius, and scapularis region to decrease fascial restrictions and increase joint mobility in a pain free zone.  Occupational Therapy Assessment and Plan OT Assessment and Plan Clinical Impression Statement: A: Able to perform thumb tacks for 1' this date. Added pulleys with therapist assisting for left arm. Tolerated well.  OT Plan: P: reassess   Goals Short Term Goals Time to Complete Short Term Goals: 2 weeks Short Term Goal 1: Patient will be educated on HEP.  Short Term Goal 2: Patient will decrease his right shoulder to 3/10 when performing daily activities. Short Term Goal 3: Patient will decrease fascial restrictions to mod in his right shoulder region. Short Term Goal 4: Patient will improve right shoulder joint stability from fair to good -  for increased ability to maintain correct posture when reaching overhead into cabinets. Short Term Goal 5: Patient will increase RUE AROM to WNL to be able to reach into overhead cabinets with less difficulty.  Long Term Goals Time to Complete Long Term Goals: 4 weeks Long Term Goal 1: Patient will return to highest level of independence while using RUE for daily and leisure acitivities.  Long Term Goal 2: Patient will decrease his right shoulder to 1/10 when performing daily activities. Long Term Goal 3: Patient will decrease fascial restrictions to min in his right shoulder region. Long Term Goal 4: Patient will improve right shoulder joint stability from good- to good for increased ability to maintain correct posture when reaching overhead into cabinets. Long Term Goal 5: Patient will increase RUE strength to 4-/5 to be able to lift dishes from countertop to overhead cabinet.  Problem List Patient Active Problem List   Diagnosis Date Noted  . Pain in joint, shoulder region 03/27/2013  . Muscle weakness (generalized) 03/27/2013  . Left leg weakness 02/28/2013  . Difficulty in walking(719.7) 02/28/2013  . Cellulitis 12/07/2012  . Skin lesion 12/07/2012  . Routine general medical examination at a health care facility 07/30/2012  . Ulcer of leg, chronic, right 11/16/2011  . Skin tag 11/16/2011  . Prediabetes 07/19/2011  . BACK PAIN 07/26/2010  . 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 COLON CANCER 02/21/2008  . HYPERLIPIDEMIA 02/21/2008  . OBESITY 02/21/2008  . Unspecified essential hypertension 02/21/2008  . CAD 02/21/2008  . SLEEP APNEA 02/21/2008    End of Session Activity Tolerance: Patient tolerated treatment well General Behavior During Therapy: [facility] for tasks assessed/performed Cognition: [facility] for tasks performed   Limmie Patricia, OTR/L,CBIS   04/18/2013, 1:49 PM

## 2013-04-18 NOTE — Progress Notes (Signed)
Physical Therapy Treatment Patient Details  Name: Craig Fields MRN: [id number] Date of Birth: 02/27/1941  Today's Date: 04/18/2013 Time: 1348 (started by PTT (WT) until 1355)-1430 PT Time Calculation (min): 42 min Charges:  TE: 4098-1191 NMR: 1358-1430 Visit#: 12 of 16  Re-eval: 04/23/13    Authorization: Medicare  Authorization Time Period:    Authorization Visit#: 12 of 20   Subjective: Symptoms/Limitations Symptoms: Pt states that he is still having difficulty with his walking.  He feels like he still has good days and bad days. States it is getting easier to get into his truck.  he reports that he will be calling Dr. Lovell Sheehan because his incision is leaking.  Pain Assessment Currently in Pain?: No/denies  Precautions/Restrictions  Precautions Precautions: Fall  Exercise/Treatments  Standing Standing, One Foot on a Step: Eyes open;2 inch;2 reps;30 secs;Limitations Standing, One Foot on a Step Limitations: bilateral min A  Retro Gait: 2 reps Retro Gait Limitations: min A with QC Sidestepping: 2 reps;Limitations Sidestepping Limitations: min A Turning: Both;3 reps Other Standing Exercises: Gait training w/o AD w/mod A x5 minutes 128 feet Aerobic Other Standing Exercises: NuStep x10 minutes [place] #3 Resistance # 4 for activity tolerance at beginning of session    Physical Therapy Assessment and Plan PT Assessment and Plan Clinical Impression Statement: Pt continues to improve his overall balance and requires less assistance with gait activities and stepping on step.  Continues to be slow during gait. PT Plan: Continue with funtional training (would like to get his Lt foot into truck with greater ease).  Continue to improve functional balance and strength.     Goals    Problem List Patient Active Problem List   Diagnosis Date Noted  . Pain in joint, shoulder region 03/27/2013  . Muscle weakness (generalized) 03/27/2013  . Left leg weakness 02/28/2013  . Difficulty  in walking(719.7) 02/28/2013  . Cellulitis 12/07/2012  . Skin lesion 12/07/2012  . Routine general medical examination at a health care facility 07/30/2012  . Ulcer of leg, chronic, right 11/16/2011  . Skin tag 11/16/2011  . Prediabetes 07/19/2011  . BACK PAIN 07/26/2010  . 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 COLON CANCER 02/21/2008  . HYPERLIPIDEMIA 02/21/2008  . OBESITY 02/21/2008  . Unspecified essential hypertension 02/21/2008  . CAD 02/21/2008  . SLEEP APNEA 02/21/2008    PT - End of Session Equipment Utilized During Treatment: Gait belt Activity Tolerance: Patient limited by fatigue;Patient tolerated treatment well General Behavior During Therapy: [facility] for tasks assessed/performed Cognition: [facility] for tasks performed  GP Functional Assessment Tool Used: 1  Shaneika Rossa, MPT, ATC 04/18/2013, 2:34 PM

## 2013-04-23 ENCOUNTER — Ambulatory Visit (HOSPITAL_COMMUNITY)
Admission: RE | Admit: 2013-04-23 | Discharge: 2013-04-23 | Disposition: A | Discharge status: Home / Self Care | Payer: Medicare Other | Source: Ambulatory Visit | Attending: Family Medicine | Admitting: Family Medicine

## 2013-04-23 NOTE — Evaluation (Addendum)
Occupational Therapy Re-Evaluation  Patient Details  Name: Craig Fields MRN: [id number] Date of Birth: Jan 06, 1941  Today's Date: 04/23/2013 Time: 1310-1345 OT Time Calculation (min): 35 min MFR 1310-1318 8' Reassess 4098-1191  27'  Visit#: 5 of 16  Re-eval: 04/23/13  Assessment Diagnosis: Rt. side weakness and pain  Authorization: Medicare  Authorization Time Period: before 15th visit  Authorization Visit#: 5 of 15   Past Medical History:  Past Medical History  Diagnosis Date  . IGT (impaired glucose tolerance)   . Anxiety disorder   . Obesity   . CAD (coronary artery disease)   . Hypertension   . Sleep apnea   . Hyperlipidemia   . CVA, old, hemiparesis     left side   . 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 Colon cancer 2007/05  . Seizures     at age 60 or 33. No meds and no reoccurances.  . Chronic back pain   . Anxiety     anxiety attacks at times   Past Surgical History:  Past Surgical History  Procedure Laterality Date  . Left inguinal  hernia repair  2005  . [name]  2007  . Coronary artery bypass graft  2004    x4  . Incisional hernia repair N/A 01/01/2013    Procedure: HERNIA REPAIR INCISIONAL;  Surgeon: Dalia Heading, MD;  Location: [facility];  Service: General;  Laterality: N/A;  umbilical area  . Insertion of mesh N/A 01/01/2013    Procedure: INSERTION OF MESH;  Surgeon: Dalia Heading, MD;  Location: [facility];  Service: General;  Laterality: N/A;    Subjective Symptoms/Limitations Symptoms: S: I can reach into the cabinets a little easier.  Pain Assessment Currently in Pain?: No/denies  Precautions/Restrictions  Precautions Precautions: Fall   Assessment Additional Assessments RUE AROM (degrees) RUE Overall AROM Comments: Assessed seated and supine. IR/ER ADD Right Shoulder Flexion:  (115/158 (on eval: 100/150)) Right Shoulder ABduction:  (76/136 (on eval: 89/130)) Right Shoulder Internal Rotation:  (90/90 (same  on eval)) Right Shoulder External Rotation:  (35/60 (on eval: 27/37)) RUE Strength Right Shoulder Flexion: 3/5 (on eval: 2+/5) Right Shoulder ABduction: 3/5 (on eval: 2+/5) Right Shoulder Internal Rotation: 5/5 (on eval: 3/5) Right Shoulder External Rotation: 5/5 (on eval: 3-/5) Palpation Palpation: min fascial restrictions right upper arm, trapezius, and scapularis region.      Exercise/Treatments Supine Protraction: PROM;10 reps Horizontal ABduction: PROM;10 reps External Rotation: PROM;10 reps Internal Rotation: PROM;10 reps Flexion: PROM;10 reps ABduction: PROM;10 reps      Manual Therapy Manual Therapy: Myofascial release Myofascial Release: MFR and manual stretching to right upper arm, trapezius, and scapularis region to decrease fascial restrictions and increase joint mobility in a pain free zone.  Occupational Therapy Assessment and Plan OT Assessment and Plan Clinical Impression Statement: A: See MD note for progress. Patient has met all short term goals and is progressing towards long term goals.  OT Frequency: Min 2X/week OT Duration: 4 weeks OT Plan: P: Cont therapy 2x/week for 4 more weeks.    Goals Short Term Goals Time to Complete Short Term Goals: 2 weeks Short Term Goal 1: Patient will be educated on HEP.  Short Term Goal 1 Progress: Met Short Term Goal 2: Patient will decrease his right shoulder to 3/10 when performing daily activities. Short Term Goal 2 Progress: Met Short Term Goal 3: Patient will decrease fascial restrictions to mod in his right shoulder region. Short Term Goal  3 Progress: Met Short Term Goal 4: Patient will improve right shoulder joint stability from fair to good - for increased ability to maintain correct posture when reaching overhead into cabinets. Short Term Goal 4 Progress: Met Short Term Goal 5: Patient will increase RUE AROM to WNL to be able to reach into overhead cabinets with less difficulty.  Short Term Goal 5 Progress:  Met Long Term Goals Time to Complete Long Term Goals: 4 weeks Long Term Goal 1: Patient will return to highest level of independence while using RUE for daily and leisure acitivities.  Long Term Goal 1 Progress: Progressing toward goal Long Term Goal 2: Patient will decrease his right shoulder to 1/10 when performing daily activities. Long Term Goal 2 Progress: Met Long Term Goal 3: Patient will decrease fascial restrictions to min in his right shoulder region. Long Term Goal 3 Progress: Met Long Term Goal 4: Patient will improve right shoulder joint stability from good- to good for increased ability to maintain correct posture when reaching overhead into cabinets. Long Term Goal 4 Progress: Progressing toward goal Long Term Goal 5: Patient will increase RUE strength to 4-/5 to be able to lift dishes from countertop to overhead cabinet. Long Term Goal 5 Progress: Progressing toward goal  Problem List Patient Active Problem List   Diagnosis Date Noted  . Pain in joint, shoulder region 03/27/2013  . Muscle weakness (generalized) 03/27/2013  . Left leg weakness 02/28/2013  . Difficulty in walking(719.7) 02/28/2013  . Cellulitis 12/07/2012  . Skin lesion 12/07/2012  . Routine general medical examination at a health care facility 07/30/2012  . Ulcer of leg, chronic, right 11/16/2011  . Skin tag 11/16/2011  . Prediabetes 07/19/2011  . BACK PAIN 07/26/2010  . 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 COLON CANCER 02/21/2008  . HYPERLIPIDEMIA 02/21/2008  . OBESITY 02/21/2008  . Unspecified essential hypertension 02/21/2008  . CAD 02/21/2008  . SLEEP APNEA 02/21/2008    End of Session Activity Tolerance: Patient tolerated treatment well General Behavior During Therapy: WFL for tasks assessed/performed Cognition: WFL for tasks performed  GO Functional Assessment Tool Used: Clinical observation Functional Limitation: Carrying, moving and handling objects Carrying, Moving and Handling  Objects Current Status (Z6109): At least 40 percent but less than 60 percent impaired, limited or restricted Carrying, Moving and Handling Objects Goal Status [phone]): At least 20 percent but less than 40 percent impaired, limited or restricted  Limmie Patricia, OTR/L,CBIS   04/23/2013, 3:21 PM  Physician Documentation Your signature is required to indicate approval of the treatment plan as stated above.  Please sign and either send electronically or make a copy of this report for your files and return this physician signed original.  Please mark one 1.__approve of plan  2. ___approve of plan with the following conditions.   ______________________________                                                          _____________________ Physician Signature  Date  

## 2013-04-23 NOTE — Progress Notes (Signed)
Physical Therapy Treatment Patient Details  Name: Craig Fields MRN: [id number] Date of Birth: 12-19-1940  Today's Date: 04/23/2013 Time: 1346-1430 PT Time Calculation (min): 44 min  Visit#: 13 of 16  Re-eval: 04/26/13 Authorization: Medicare  Authorization Visit#: 13 of 20  Charges:  therex 1346-1400 (14'), gait 1403-1430 (25')  Subjective: Symptoms/Limitations Symptoms: Pt reports conitnued HEP compliance. Pain Assessment Currently in Pain?: No/denies  Precautions/Restrictions  Precautions Precautions: Fall  Treatment session began by Seth Bake, PTA Exercise/Treatments Aerobic Elliptical: NuStep: 10 minutes, Hill #3, Resistance 4 for activity tolerance, avg SPM >1 Machines for Strengthening Cybex Knee Flexion: 3.5pl x20 Standing Gait Training: 6' without AD 125 feet Seated Other Seated Knee Exercises: sit to stand without UE's on foam from chair 10X   Physical Therapy Assessment and Plan PT Assessment and Plan Clinical Impression Statement: Continued focus on increasing independence with gait and overall stability.  Pt able to ambulate without AD with less assistance, only with CGA.  Pt with increasing confidence with abilities.   Less assistance with sit to stand without use of UE's. PT Plan: Continue with funtional training (would like to get his Lt foot into truck with greater ease).  Continue to improve functional balance and strength.      Problem List Patient Active Problem List   Diagnosis Date Noted  . Pain in joint, shoulder region 03/27/2013  . Muscle weakness (generalized) 03/27/2013  . Left leg weakness 02/28/2013  . Difficulty in walking(719.7) 02/28/2013  . Cellulitis 12/07/2012  . Skin lesion 12/07/2012  . Routine general medical examination at a health care facility 07/30/2012  . Ulcer of leg, chronic, right 11/16/2011  . Skin tag 11/16/2011  . Prediabetes 07/19/2011  . BACK PAIN 07/26/2010  . 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  COLON CANCER 02/21/2008  . HYPERLIPIDEMIA 02/21/2008  . OBESITY 02/21/2008  . Unspecified essential hypertension 02/21/2008  . CAD 02/21/2008  . SLEEP APNEA 02/21/2008    PT - End of Session Equipment Utilized During Treatment: Gait belt Activity Tolerance: Patient limited by fatigue;Patient tolerated treatment well General Behavior During Therapy: [facility] for tasks assessed/performed Cognition: WFL for tasks performed     Lurena Nida, PTA/CLT 04/23/2013, 2:39 PM

## 2013-04-25 ENCOUNTER — Ambulatory Visit (HOSPITAL_COMMUNITY)
Admission: RE | Admit: 2013-04-25 | Discharge: 2013-04-25 | Disposition: A | Discharge status: Home / Self Care | Payer: Medicare Other | Source: Ambulatory Visit | Attending: Family Medicine | Admitting: Family Medicine

## 2013-04-25 DIAGNOSIS — R29898 Other symptoms and signs involving the musculoskeletal system: Secondary | ICD-10-CM

## 2013-04-25 DIAGNOSIS — R262 Difficulty in walking, not elsewhere classified: Secondary | ICD-10-CM

## 2013-04-25 NOTE — Progress Notes (Signed)
Occupational Therapy Treatment Patient Details  Name: Craig Fields MRN: [id number] Date of Birth: Jan 12, 1941  Today's Date: 04/25/2013 Time: 1020-1100 OT Time Calculation (min): 40 min MFR 1020-1031 11' Therex 1031-1100 29'  Visit#: 6 of 16  Re-eval: 04/23/13    Authorization: Medicare  Authorization Time Period: before 15th visit  Authorization Visit#: 6 of 15  Subjective Symptoms/Limitations Symptoms: SL: My arm was a little sore this morning. It feels fine now. Pain Assessment Currently in Pain?: No/denies Pain Location: Shoulder  Precautions/Restrictions  Precautions Precautions: Fall  Exercise/Treatments Supine Protraction: PROM;10 reps;AAROM;12 reps Horizontal ABduction: PROM;AROM;10 reps (able to complete 5X AROM and 5X AAROM) External Rotation: PROM;10 reps;AROM Internal Rotation: PROM;AROM;10 reps Flexion: PROM;AAROM;10 reps ABduction: PROM;AROM;10 reps (After 5X pt had difficulty completing. ) ROM / Strengthening / Isometric Strengthening UBE (Upper Arm Bike): 1.0 3' forward 3' reverse Thumb Tacks: 1' right Proximal Shoulder Strengthening, Supine: 10x; right        Manual Therapy Manual Therapy: Myofascial release Myofascial Release: MFR and manual stretching to right upper arm, trapezius, and scapularis region to decrease fascial restrictions and increase joint mobility in a pain free zone.  Occupational Therapy Assessment and Plan OT Assessment and Plan Clinical Impression Statement: A: Added AROM supine. patient able to complete all exercises with the exception of flexion and Abduction requring min assist from therapist. Added UBE bike. patient tolerated well.  OT Plan: P: Work on performing all supine exercises without assist from therapist. Attempt wall wash.   Goals Home Exercise Program Pt/caregiver will Perform Home Exercise Program: Independently PT Goal: Perform Home Exercise Program - Progress: Met Short Term Goals Time to Complete Short  Term Goals: 2 weeks Short Term Goal 1: Patient will be educated on HEP.  Short Term Goal 2: Patient will decrease his right shoulder to 3/10 when performing daily activities. Short Term Goal 3: Patient will decrease fascial restrictions to mod in his right shoulder region. Short Term Goal 4: Patient will improve right shoulder joint stability from fair to good - for increased ability to maintain correct posture when reaching overhead into cabinets. Short Term Goal 5: Patient will increase RUE AROM to WNL to be able to reach into overhead cabinets with less difficulty.  Long Term Goals Time to Complete Long Term Goals: 4 weeks Long Term Goal 1: Patient will return to highest level of independence while using RUE for daily and leisure acitivities.  Long Term Goal 1 Progress: Progressing toward goal Long Term Goal 2: Patient will decrease his right shoulder to 1/10 when performing daily activities. Long Term Goal 3: Patient will decrease fascial restrictions to min in his right shoulder region. Long Term Goal 4: Patient will improve right shoulder joint stability from good- to good for increased ability to maintain correct posture when reaching overhead into cabinets. Long Term Goal 4 Progress: Progressing toward goal Long Term Goal 5: Patient will increase RUE strength to 4-/5 to be able to lift dishes from countertop to overhead cabinet. Long Term Goal 5 Progress: Progressing toward goal  Problem List Patient Active Problem List   Diagnosis Date Noted  . Pain in joint, shoulder region 03/27/2013  . Muscle weakness (generalized) 03/27/2013  . Left leg weakness 02/28/2013  . Difficulty in walking(719.7) 02/28/2013  . Cellulitis 12/07/2012  . Skin lesion 12/07/2012  . Routine general medical examination at a health care facility 07/30/2012  . Ulcer of leg, chronic, right 11/16/2011  . Skin tag 11/16/2011  . Prediabetes 07/19/2011  . BACK PAIN  07/26/2010  . CVA WITH LEFT HEMIPARESIS  11/05/2008  . COLON CANCER 02/21/2008  . HYPERLIPIDEMIA 02/21/2008  . OBESITY 02/21/2008  . Unspecified essential hypertension 02/21/2008  . CAD 02/21/2008  . SLEEP APNEA 02/21/2008    End of Session Activity Tolerance: Patient tolerated treatment well General Behavior During Therapy: [facility] for tasks assessed/performed Cognition: [facility] for tasks performed   Limmie Patricia, OTR/L,CBIS   04/25/2013, 1:27 PM

## 2013-04-25 NOTE — Evaluation (Cosign Needed)
Physical Therapy Discharge Summary and Treatment  Patient Details  Name: Craig Fields MRN: [id number] Date of Birth: 12-03-1940  Today's Date: 04/25/2013 Time: 4098-1191 PT Time Calculation (min): 42 min Charges: PPT: [phone] Self Care: [phone] TE: 1000-1015             Visit#: 14 of 16  Re-eval: 04/26/13 Assessment Diagnosis: LLE weakness Surgical Date: 01/01/13 Next MD Visit: Dr. Alanda Amass - unscheduled (cardiologist)  Authorization: Medicare    Authorization Time Period:    Authorization Visit#: 14 of 20   Subjective Symptoms/Limitations Symptoms: Pt reports greater ease and confidence with walking outdoors and with getting into his truck.  reports greatest problem is shoudler pain. Pt reports he is finally confident enough to go to the [employer].  Pain Assessment Currently in Pain?: No/denies Pain Location: Shoulder  Precautions/Restrictions  Precautions Precautions: Fall  Sensation/Coordination/Flexibility/Functional Tests Functional Tests Functional Tests: sit to stand without UE assist 20 INCHES ( WAS 20 inch surface) Functional Tests: Activity specific Balance Condfidence Scale (ABC):m  Assessment LLE Strength LLE Overall Strength Comments: Hip IR: 2/5 (was 0/5), Hip ER: 2/5 (was 2/5) Left Hip Flexion: 3/5 (was 3/5) Left Hip ABduction: 3/5 (was 2+/5) Left Hip ADduction: 3/5 (was 2+/5) Left Knee Flexion: 3/5 (was 2/5) Left Knee Extension: 3/5 (was 2+/5)  Exercise/Treatments Mobility/Balance  Ambulation/Gait Ambulation/Gait: Yes Ambulation Distance (Feet): 82 Feet (was 80 feet in 2 minutes ) Assistive device: Small based quad cane [employer] Test Sit to Stand: Able to stand  independently using hands Standing Unsupported: Able to stand safely 2 minutes Sitting with Back Unsupported but Feet Supported on Floor or Stool: Able to sit safely and securely 2 minutes Stand to Sit: Controls descent by using hands Transfers: Able to transfer safely,  definite need of hands Standing Unsupported with Eyes Closed: Able to stand 10 seconds safely Standing Ubsupported with Feet Together: Able to place feet together independently and stand 1 minute safely From Standing, Reach Forward with Outstretched Arm: Can reach forward >12 cm safely (5") From Standing Position, Pick up Object from Floor: Unable to pick up shoe, but reaches 2-5 cm (1-2") from shoe and balances independently From Standing Position, Turn to Look Behind Over each Shoulder: Looks behind one side only/other side shows less weight shift Turn 360 Degrees: Able to turn 360 degrees safely but slowly Standing Unsupported, Alternately Place Feet on Step/Stool: Needs assistance to keep from falling or unable to try Standing Unsupported, One Foot in Front: Able to take small step independently and hold 30 seconds Standing on One Leg: Unable to try or needs assist to prevent fall Total Score: 37 Timed Up and Go Test TUG: Normal TUG Normal TUG (seconds): 43 (with quad cane (was 43 with quad cane))      Physical Therapy Assessment and Plan PT Assessment and Plan Clinical Impression Statement: Mr. Koren has attended 14 OP PT visits over 8 weeks for Lt sided weakness s/p hernia repair in April.  At this time pt has plateaued with his balance acheivements and gait speed and has made improvements with his Lt LE strength.  He reports greater ease with functional activities and is now confident to continue with community mobility with his quad cane and has returned to his PLOF. At this time will d/c pt from OP PT with his HEP.  PT Plan: D/C    Goals Home Exercise Program Pt/caregiver will Perform Home Exercise Program: Independently: Met PT Short Term Goals: 2 weeks PT Short Term Goal 1: Pt  will improve his static standing balance and demonstrate partial tandem gait x15 seconds on BLE. : Met PT Short Term Goal 2: Pt will improve his functional LE strength and go from sit to stand without UE  assist from a 19 inch surface to decrease use of RUE to decrease pain to RUE (20 inch) PT Long Term Goalss: 8 weeks PT Long Term Goal 1: Pt will improve his berg balance test to 37/56 to decrease risk of falls. (37/56): Met PT Long Term Goal 2: Pt will improve gait speed and compete the Tug in 45 seconds with supervision and LRAD for improved safety in the community.  (43 sec with quad cane): Met Long Term Goal 3: Pt will improve his activity tolerance and complete 140 feet in 2 minutes with supervision with LRAD in order to return to community mobility.  (82 feet in 2 minutes w/quad cane): Progressing toward goal  Problem List Patient Active Problem List   Diagnosis Date Noted  . Pain in joint, shoulder region 03/27/2013  . Muscle weakness (generalized) 03/27/2013  . Left leg weakness 02/28/2013  . Difficulty in walking(719.7) 02/28/2013  . Cellulitis 12/07/2012  . Skin lesion 12/07/2012  . Routine general medical examination at a health care facility 07/30/2012  . Ulcer of leg, chronic, right 11/16/2011  . Skin tag 11/16/2011  . Prediabetes 07/19/2011  . BACK PAIN 07/26/2010  . 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 COLON CANCER 02/21/2008  . HYPERLIPIDEMIA 02/21/2008  . OBESITY 02/21/2008  . Unspecified essential hypertension 02/21/2008  . CAD 02/21/2008  . SLEEP APNEA 02/21/2008    PT - End of Session Equipment Utilized During Treatment: Gait belt Activity Tolerance: Patient limited by fatigue;Patient tolerated treatment well General Behavior During Therapy: [facility] for tasks assessed/performed Cognition: WFL for tasks performed PT Plan of Care PT Patient Instructions: discussed HEP to continue and encouraged to continue with functional activities to improve strength, discussed ABC score Consulted and Agree with Plan of Care: Patient  GP Functional Assessment Tool Used: ABC: 45%, and clinical observation  Functional Limitation: Mobility: Walking and moving around Mobility:  Walking and Moving Around Goal Status [phone]): At least 1 percent but less than 20 percent impaired, limited or restricted Mobility: Walking and Moving Around Discharge Status [phone]): At least 1 percent but less than 20 percent impaired, limited or restricted  Chritopher Coster, MPT, ATC 04/25/2013, 10:11 AM  Physician Documentation Your signature is required to indicate approval of the treatment plan as stated above.  Please sign and either send electronically or make a copy of this report for your files and return this physician signed original.   Please mark one 1.__approve of plan  2. ___approve of plan with the following conditions.   ______________________________                                                          _____________________ Physician Signature  Date  

## 2013-04-30 ENCOUNTER — Ambulatory Visit (HOSPITAL_COMMUNITY)
Admission: RE | Admit: 2013-04-30 | Discharge: 2013-04-30 | Disposition: A | Discharge status: Home / Self Care | Payer: Medicare Other | Source: Ambulatory Visit | Attending: Family Medicine | Admitting: Family Medicine

## 2013-04-30 ENCOUNTER — Ambulatory Visit (HOSPITAL_COMMUNITY): Payer: Medicare Other | Admitting: Specialist

## 2013-04-30 NOTE — Progress Notes (Signed)
Occupational Therapy Treatment Patient Details  Name: Craig Fields MRN: [id number] Date of Birth: [birth year]  Today's Date: 04/30/2013 Time: 4098-1191 OT Time Calculation (min): 34 min Manual Therapy [phone] 10' Therapeutic exercises [phone] 24' Visit#: 7 of 16  Re-eval: 05/14/13    Authorization: Medicare  Authorization Time Period: before 15th visit  Authorization Visit#: 7 of 15  Subjective S:  I almost fell the other day, and I must have irritated my shoulder. Pain Assessment Currently in Pain?: Yes Pain Score: 2  Pain Location: Shoulder Pain Orientation: Right Pain Type: Acute pain  Precautions/Restrictions   n/a  Exercise/Treatments Supine Protraction: PROM;10 reps;AROM;12 reps Horizontal ABduction: PROM;AROM (AROM X 7 AAROM X 3) External Rotation: PROM;AROM;10 reps Internal Rotation: PROM;AROM;10 reps Flexion: PROM;AROM;10 reps (AAROM X 2, AROM X 8) ABduction: PROM;AAROM;10 reps Seated Elevation: AROM;Right;10 reps Extension: Right;10 reps;Theraband;AROM Theraband Level (Shoulder Extension): Level 2 (Red) Retraction: Theraband;10 reps Theraband Level (Shoulder Retraction): Level 2 (Red) Row: AROM;Right;10 reps;Theraband Theraband Level (Shoulder Row): Level 2 (Red) External Rotation: Theraband;10 reps Theraband Level (Shoulder External Rotation): Level 2 (Red) Internal Rotation: Theraband;10 reps Theraband Level (Shoulder Internal Rotation): Level 2 (Red) Therapy Ball Flexion: 20 reps ABduction: 20 reps ROM / Strengthening / Isometric Strengthening Proximal Shoulder Strengthening, Supine: 10X right arm only with min facilitation for technique      Manual Therapy Manual Therapy: Myofascial release Myofascial Release: MFR and manual stretching to right upper arm, trapezius, and scapularis region to decrease fascial restrictions and increase joint mobility in a pain free zone.  Occupational Therapy Assessment and Plan OT Assessment and Plan Clinical  Impression Statement: A:  Added theraband in seated for proximal shoulder stability. OT Plan: P:  Complete supine exercises all AROM, add AAROM in seated. Add Nustep.   Goals Short Term Goals Time to Complete Short Term Goals: 2 weeks Short Term Goal 1: Patient will be educated on HEP.  Short Term Goal 2: Patient will decrease his right shoulder to 3/10 when performing daily activities. Short Term Goal 3: Patient will decrease fascial restrictions to mod in his right shoulder region. Short Term Goal 4: Patient will improve right shoulder joint stability from fair to good - for increased ability to maintain correct posture when reaching overhead into cabinets. Short Term Goal 5: Patient will increase RUE AROM to WNL to be able to reach into overhead cabinets with less difficulty.  Long Term Goals Time to Complete Long Term Goals: 4 weeks Long Term Goal 1: Patient will return to highest level of independence while using RUE for daily and leisure acitivities.  Long Term Goal 1 Progress: Progressing toward goal Long Term Goal 2: Patient will decrease his right shoulder to 1/10 when performing daily activities. Long Term Goal 3: Patient will decrease fascial restrictions to min in his right shoulder region. Long Term Goal 4: Patient will improve right shoulder joint stability from good- to good for increased ability to maintain correct posture when reaching overhead into cabinets. Long Term Goal 4 Progress: Progressing toward goal Long Term Goal 5: Patient will increase RUE strength to 4-/5 to be able to lift dishes from countertop to overhead cabinet. Long Term Goal 5 Progress: Progressing toward goal  Problem List Patient Active Problem List   Diagnosis Date Noted  . Pain in joint, shoulder region 03/27/2013  . Muscle weakness (generalized) 03/27/2013  . Left leg weakness 02/28/2013  . Difficulty in walking(719.7) 02/28/2013  . Cellulitis 12/07/2012  . Skin lesion 12/07/2012  . Routine  general medical examination  at a health care facility 07/30/2012  . Ulcer of leg, chronic, right 11/16/2011  . Skin tag 11/16/2011  . Prediabetes 07/19/2011  . BACK PAIN 07/26/2010  . 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 COLON CANCER 02/21/2008  . HYPERLIPIDEMIA 02/21/2008  . OBESITY 02/21/2008  . Unspecified essential hypertension 02/21/2008  . CAD 02/21/2008  . SLEEP APNEA 02/21/2008    End of Session Activity Tolerance: Patient tolerated treatment well General Behavior During Therapy: [facility] for tasks assessed/performed Cognition: WFL for tasks performed  GO    Shirlean Mylar, OTR/L  04/30/2013, 10:23 AM

## 2013-05-02 ENCOUNTER — Ambulatory Visit (HOSPITAL_COMMUNITY): Payer: Medicare Other

## 2013-05-02 ENCOUNTER — Ambulatory Visit (HOSPITAL_COMMUNITY)
Admission: RE | Admit: 2013-05-02 | Discharge: 2013-05-02 | Disposition: A | Discharge status: Home / Self Care | Payer: Medicare Other | Source: Ambulatory Visit | Attending: Family Medicine | Admitting: Family Medicine

## 2013-05-02 NOTE — Progress Notes (Signed)
Occupational Therapy Treatment Patient Details  Name: Craig Fields MRN: [id number] Date of Birth: August 24, 1941  Today's Date: 05/02/2013 Time: 1110-1150 OT Time Calculation (min): 40 min MFR 1110-1120 10' Therex 1120-1150 30'  Visit#: 8 of 16  Re-eval: 05/14/13    Authorization: Medicare  Authorization Time Period: before 15th visit  Authorization Visit#: 8 of 15  Subjective Symptoms/Limitations Symptoms: S: I've noticed some days I have good days and some days I have bad days. Pain Assessment Currently in Pain?: No/denies  Precautions/Restrictions  Precautions Precautions: Fall  Exercise/Treatments Supine Protraction: PROM;10 reps;AROM;12 reps Horizontal ABduction: PROM;AROM;10 reps External Rotation: PROM;AROM;10 reps Internal Rotation: PROM;AROM;10 reps Flexion: PROM;AAROM;10 reps (unable to complete any AROM) ABduction: PROM;AROM;10 reps Seated Protraction: AROM;10 reps Horizontal ABduction: AAROM;10 reps External Rotation: AROM;10 reps Internal Rotation: AROM;10 reps Flexion: AAROM;10 reps Abduction: AAROM;10 reps ROM / Strengthening / Isometric Strengthening Proximal Shoulder Strengthening, Supine: 10X right arm only low level Other ROM/Strengthening Exercises: Nustep; level 1; 6'; right arm only        Manual Therapy Manual Therapy: Myofascial release Myofascial Release: MFR and manual stretching to right upper arm, trapezius, and scapularis region to decrease fascial restrictions and increase joint mobility in a pain free zone.  Occupational Therapy Assessment and Plan OT Assessment and Plan Clinical Impression Statement: A: Added AAROM seated and NuStep. Pt tolerated well. Attempted to perform all supine exercises as AROM. Patient still requires assist with supine shoulder flexion. OT Plan: P: Complete wall wash.   Goals Short Term Goals Time to Complete Short Term Goals: 2 weeks Short Term Goal 1: Patient will be educated on HEP.  Short Term Goal  2: Patient will decrease his right shoulder to 3/10 when performing daily activities. Short Term Goal 3: Patient will decrease fascial restrictions to mod in his right shoulder region. Short Term Goal 4: Patient will improve right shoulder joint stability from fair to good - for increased ability to maintain correct posture when reaching overhead into cabinets. Short Term Goal 5: Patient will increase RUE AROM to WNL to be able to reach into overhead cabinets with less difficulty.  Long Term Goals Time to Complete Long Term Goals: 4 weeks Long Term Goal 1: Patient will return to highest level of independence while using RUE for daily and leisure acitivities.  Long Term Goal 2: Patient will decrease his right shoulder to 1/10 when performing daily activities. Long Term Goal 3: Patient will decrease fascial restrictions to min in his right shoulder region. Long Term Goal 4: Patient will improve right shoulder joint stability from good- to good for increased ability to maintain correct posture when reaching overhead into cabinets. Long Term Goal 5: Patient will increase RUE strength to 4-/5 to be able to lift dishes from countertop to overhead cabinet.  Problem List Patient Active Problem List   Diagnosis Date Noted  . Pain in joint, shoulder region 03/27/2013  . Muscle weakness (generalized) 03/27/2013  . Left leg weakness 02/28/2013  . Difficulty in walking(719.7) 02/28/2013  . Cellulitis 12/07/2012  . Skin lesion 12/07/2012  . Routine general medical examination at a health care facility 07/30/2012  . Ulcer of leg, chronic, right 11/16/2011  . Skin tag 11/16/2011  . Prediabetes 07/19/2011  . BACK PAIN 07/26/2010  . 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 COLON CANCER 02/21/2008  . HYPERLIPIDEMIA 02/21/2008  . OBESITY 02/21/2008  . Unspecified essential hypertension 02/21/2008  . CAD 02/21/2008  . SLEEP APNEA 02/21/2008    General Behavior During Therapy: [facility]  for tasks  assessed/performed Cognition: [facility] for tasks performed   Limmie Patricia, OTR/L,CBIS   05/02/2013, 12:01 PM

## 2013-05-07 ENCOUNTER — Ambulatory Visit (HOSPITAL_COMMUNITY): Payer: Medicare Other | Admitting: Physical Therapy

## 2013-05-09 ENCOUNTER — Ambulatory Visit (HOSPITAL_COMMUNITY)
Admission: RE | Admit: 2013-05-09 | Discharge: 2013-05-09 | Disposition: A | Discharge status: Home / Self Care | Payer: Medicare Other | Source: Ambulatory Visit | Attending: Family Medicine | Admitting: Family Medicine

## 2013-05-09 ENCOUNTER — Ambulatory Visit (HOSPITAL_COMMUNITY): Payer: Medicare Other | Admitting: Specialist

## 2013-05-09 NOTE — Evaluation (Signed)
Occupational Therapy Re - Evaluation  Patient Details  Name: Craig Fields MRN: [id number] Date of Birth: 1941/09/12  Today's Date: 05/09/2013 Time: 2952-8413 OT Time Calculation (min): 24 min Manual Therapy [phone] 11' ROM/MMT [phone] Visit#: 9 of 16  Re-eval: 05/09/13     Authorization: Medicare  Authorization Time Period: before 15th visit  Authorization Visit#: 9 of 15   Past Medical History:  Past Medical History  Diagnosis Date  . IGT (impaired glucose tolerance)   . Anxiety disorder   . Obesity   . CAD (coronary artery disease)   . Hypertension   . Sleep apnea   . Hyperlipidemia   . CVA, old, hemiparesis     left side   . 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 Colon cancer 2007/05  . Seizures     at age 54 or 86. No meds and no reoccurances.  . Chronic back pain   . Anxiety     anxiety attacks at times   Past Surgical History:  Past Surgical History  Procedure Laterality Date  . Left inguinal  hernia repair  2005  . [name]  2007  . Coronary artery bypass graft  2004    x4  . Incisional hernia repair N/A 01/01/2013    Procedure: HERNIA REPAIR INCISIONAL;  Surgeon: Dalia Heading, MD;  Location: [facility];  Service: General;  Laterality: N/A;  umbilical area  . Insertion of mesh N/A 01/01/2013    Procedure: INSERTION OF MESH;  Surgeon: Dalia Heading, MD;  Location: [facility];  Service: General;  Laterality: N/A;    Subjective  S:  I feel like this therapy is kind of irritating my arm.  I also have alot going on where I had my hernia repair, I think therapy is just too much and I want to stop today. Pain Assessment Currently in Pain?: Yes Pain Score: 3  Pain Location: Shoulder Pain Orientation: Right Pain Type: Acute pain  Additional Assessments RUE AROM (degrees) RUE Overall AROM Comments: Assessed seated and supine. IR/ER ADD (04/23/13) Right Shoulder Flexion:  (105/160 (115/158)) Right Shoulder ABduction:  (115/150  (76/136)) Right Shoulder Internal Rotation:  (90/90 (90/90)) Right Shoulder External Rotation:  (20/0 (35/60)) RUE Strength Right Shoulder Flexion: 3+/5 (3/5) Right Shoulder ABduction: 3+/5 (3/5) Right Shoulder Internal Rotation: 4/5 Right Shoulder External Rotation: 4/5     Exercise/Treatments    Manual Therapy Manual Therapy: Myofascial release Myofascial Release: MFR and manual stretching to right upper arm, trapezius, and scapularis region to decrease fascial restrictions and increase joint mobility in a pain free zone.  Occupational Therapy Assessment and Plan OT Assessment and Plan Clinical Impression Statement: A:  Patient reassessed for dc this date, per patient request.  Patient has other medical issues going on and also feels that therapy is irritating his shoulder rather than helping it.   OT Plan: P:  DC this date, per patient request.    Goals Short Term Goals Time to Complete Short Term Goals: 2 weeks Short Term Goal 1: Patient will be educated on HEP.  Short Term Goal 1 Progress: Met Short Term Goal 2: Patient will decrease his right shoulder to 3/10 when performing daily activities. Short Term Goal 2 Progress: Met Short Term Goal 3: Patient will decrease fascial restrictions to mod in his right shoulder region. Short Term Goal 3 Progress: Met Short Term Goal 4: Patient will improve right shoulder joint stability from fair to good - for increased  ability to maintain correct posture when reaching overhead into cabinets. Short Term Goal 4 Progress: Met Short Term Goal 5: Patient will increase RUE AROM to WNL to be able to reach into overhead cabinets with less difficulty.  Short Term Goal 5 Progress: Met Long Term Goals Time to Complete Long Term Goals: 4 weeks Long Term Goal 1: Patient will return to highest level of independence while using RUE for daily and leisure acitivities.  Long Term Goal 1 Progress: Progressing toward goal Long Term Goal 2: Patient will  decrease his right shoulder to 1/10 when performing daily activities. Long Term Goal 2 Progress: Met Long Term Goal 3: Patient will decrease fascial restrictions to min in his right shoulder region. Long Term Goal 3 Progress: Met Long Term Goal 4: Patient will improve right shoulder joint stability from good- to good for increased ability to maintain correct posture when reaching overhead into cabinets. Long Term Goal 4 Progress: Progressing toward goal Long Term Goal 5: Patient will increase RUE strength to 4-/5 to be able to lift dishes from countertop to overhead cabinet. Long Term Goal 5 Progress: Progressing toward goal  Problem List Patient Active Problem List   Diagnosis Date Noted  . Pain in joint, shoulder region 03/27/2013  . Muscle weakness (generalized) 03/27/2013  . Left leg weakness 02/28/2013  . Difficulty in walking(719.7) 02/28/2013  . Cellulitis 12/07/2012  . Skin lesion 12/07/2012  . Routine general medical examination at a health care facility 07/30/2012  . Ulcer of leg, chronic, right 11/16/2011  . Skin tag 11/16/2011  . Prediabetes 07/19/2011  . BACK PAIN 07/26/2010  . 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 COLON CANCER 02/21/2008  . HYPERLIPIDEMIA 02/21/2008  . OBESITY 02/21/2008  . Unspecified essential hypertension 02/21/2008  . CAD 02/21/2008  . SLEEP APNEA 02/21/2008    End of Session Activity Tolerance: Patient tolerated treatment well General Behavior During Therapy: WFL for tasks assessed/performed Cognition: WFL for tasks performed  GO Functional Assessment Tool Used: Clinical observation Functional Limitation: Carrying, moving and handling objects Carrying, Moving and Handling Objects Goal Status ([id number]): At least 20 percent but less than 40 percent impaired, limited or restricted Carrying, Moving and Handling Objects Discharge Status [phone]): At least 40 percent but less than 60 percent impaired, limited or restricted  Shirlean Mylar,  OTR/L  05/09/2013, 11:21 AM  Physician Documentation Your signature is required to indicate approval of the treatment plan as stated above.  Please sign and either send electronically or make a copy of this report for your files and return this physician signed original.  Please mark one 1.__approve of plan  2. ___approve of plan with the following conditions.   ______________________________                                                          _____________________ Physician Signature  Date  

## 2013-05-21 ENCOUNTER — Other Ambulatory Visit: Payer: Self-pay

## 2013-05-21 ENCOUNTER — Telehealth: Payer: Self-pay | Admitting: Family Medicine

## 2013-05-21 MED ORDER — LISINOPRIL 20 MG PO TABS: 10.0000 mg | ORAL_TABLET | Freq: Every day | ORAL | Status: DC

## 2013-05-21 NOTE — Telephone Encounter (Signed)
Refilled Lisinopril 10mg .  In chart as 20mg  but according to last ov patient should continue on lower dose since he was exhibiting low blood pressure symptoms.

## 2013-05-28 ENCOUNTER — Other Ambulatory Visit: Payer: Self-pay | Admitting: Family Medicine

## 2013-06-04 ENCOUNTER — Telehealth: Payer: Self-pay | Admitting: Family Medicine

## 2013-06-04 NOTE — Telephone Encounter (Signed)
None ordered for pt. What does he need?

## 2013-06-05 DIAGNOSIS — E1149 Type 2 diabetes mellitus with other diabetic neurological complication: Secondary | ICD-10-CM | POA: Diagnosis not present

## 2013-06-05 DIAGNOSIS — L851 Acquired keratosis [keratoderma] palmaris et plantaris: Secondary | ICD-10-CM | POA: Diagnosis not present

## 2013-06-05 DIAGNOSIS — B351 Tinea unguium: Secondary | ICD-10-CM | POA: Diagnosis not present

## 2013-06-12 ENCOUNTER — Ambulatory Visit: Payer: Medicare Other | Admitting: Family Medicine

## 2013-06-18 ENCOUNTER — Encounter: Payer: Self-pay | Admitting: Family Medicine

## 2013-06-18 ENCOUNTER — Ambulatory Visit (INDEPENDENT_AMBULATORY_CARE_PROVIDER_SITE_OTHER): Payer: Medicare Other | Admitting: Family Medicine

## 2013-06-18 VITALS — BP 144/82 | HR 78 | Resp 18 | Ht 68.75 in | Wt 264.0 lb

## 2013-06-18 DIAGNOSIS — I2510 Atherosclerotic heart disease of native coronary artery without angina pectoris: Secondary | ICD-10-CM

## 2013-06-18 DIAGNOSIS — R7303 Prediabetes: Secondary | ICD-10-CM

## 2013-06-18 DIAGNOSIS — Z23 Encounter for immunization: Secondary | ICD-10-CM

## 2013-06-18 DIAGNOSIS — Z1211 Encounter for screening for malignant neoplasm of colon: Secondary | ICD-10-CM | POA: Diagnosis not present

## 2013-06-18 DIAGNOSIS — I10 Essential (primary) hypertension: Secondary | ICD-10-CM

## 2013-06-18 DIAGNOSIS — E785 Hyperlipidemia, unspecified: Secondary | ICD-10-CM

## 2013-06-18 DIAGNOSIS — E669 Obesity, unspecified: Secondary | ICD-10-CM

## 2013-06-18 DIAGNOSIS — I69959 Hemiplegia and hemiparesis following unspecified cerebrovascular disease affecting unspecified side: Secondary | ICD-10-CM

## 2013-06-18 DIAGNOSIS — R7309 Other abnormal glucose: Secondary | ICD-10-CM

## 2013-06-18 LAB — HEMOCCULT GUIAC POC 1CARD (OFFICE): Fecal Occult Blood, POC: NEGATIVE

## 2013-06-18 LAB — HEMOGLOBIN A1C
Hgb A1c MFr Bld: 6 % — ABNORMAL HIGH (ref <5.7)
Mean Plasma Glucose: 126 mg/dL — ABNORMAL HIGH (ref <117)

## 2013-06-18 NOTE — Patient Instructions (Addendum)
Annual wellness in February, please call if you need me before   HBA1C today  Flu vaccine today Rectal exam today,  Call and let us know when you want an appointment set up with [facility] cardiology as a new patient please. You will need to sign at the office in  [place] for release of records from [place] before this can be done

## 2013-07-09 ENCOUNTER — Other Ambulatory Visit: Payer: Self-pay | Admitting: Family Medicine

## 2013-07-12 ENCOUNTER — Other Ambulatory Visit: Payer: Self-pay | Admitting: Family Medicine

## 2013-07-16 ENCOUNTER — Other Ambulatory Visit: Payer: Self-pay

## 2013-07-16 MED ORDER — HYDROCODONE-ACETAMINOPHEN 5-325 MG PO TABS: ORAL_TABLET | ORAL | Status: DC

## 2013-07-18 ENCOUNTER — Other Ambulatory Visit: Payer: Self-pay | Admitting: Family Medicine

## 2013-07-18 DIAGNOSIS — I25810 Atherosclerosis of coronary artery bypass graft(s) without angina pectoris: Secondary | ICD-10-CM

## 2013-07-31 ENCOUNTER — Ambulatory Visit: Payer: Medicare Other | Admitting: Cardiology

## 2013-08-06 ENCOUNTER — Encounter: Payer: Self-pay | Admitting: Cardiology

## 2013-08-06 ENCOUNTER — Ambulatory Visit (INDEPENDENT_AMBULATORY_CARE_PROVIDER_SITE_OTHER): Payer: Medicare Other | Admitting: Cardiology

## 2013-08-06 VITALS — BP 150/69 | HR 72 | Ht 68.0 in | Wt 266.5 lb

## 2013-08-06 DIAGNOSIS — I10 Essential (primary) hypertension: Secondary | ICD-10-CM

## 2013-08-06 DIAGNOSIS — E785 Hyperlipidemia, unspecified: Secondary | ICD-10-CM | POA: Diagnosis not present

## 2013-08-06 DIAGNOSIS — I69959 Hemiplegia and hemiparesis following unspecified cerebrovascular disease affecting unspecified side: Secondary | ICD-10-CM

## 2013-08-06 DIAGNOSIS — I2510 Atherosclerotic heart disease of native coronary artery without angina pectoris: Secondary | ICD-10-CM | POA: Diagnosis not present

## 2013-08-06 NOTE — Assessment & Plan Note (Signed)
Keep followup with Dr. Simpson. 

## 2013-08-06 NOTE — Patient Instructions (Addendum)
Your physician wants you to follow-up in: 6 months  You will receive a reminder letter in the mail two months in advance. If you don't receive a letter, please call our office to schedule the follow-up appointment.  Your physician recommends that you continue on your current medications as directed. Please refer to the Current Medication list given to you today.  

## 2013-08-06 NOTE — Assessment & Plan Note (Signed)
Represents a chronic functional limitation.

## 2013-08-06 NOTE — Assessment & Plan Note (Signed)
On Zocor and omega-3 supplements, LDL under 100 in March. Followed by Dr. Lodema Hong.

## 2013-08-06 NOTE — Progress Notes (Signed)
Clinical Summary Craig Fields is a 72 y.o.male presenting for an office visit. He is a former patient of Dr. Alanda Amass. He is here with his wife.  From a cardiac perspective, he denies any recurring angina symptoms, no nitroglycerin use. He is not particularly active, has functional limitations related to left-sided hemiparesis. He uses a brace on his left leg and also a cane when he walks.  Echocardiogram from July 2014 revealed moderate LVH with LVEF 55-60%, grade 1 diastolic dysfunction, mild mitral regurgitation, mild left atrial enlargement.  Lexiscan Myoview from January 2013 was overall low risk, basal septal and lateral thinning present which was felt to be either due to attenuation although small region of ischemia not excluded. LVEF 53%.  We reviewed his medications. He reports compliance. Lipids are followed by Dr. Lodema Hong. LDL was 94 in March.   Allergies  Allergen Reactions  . Bee Venom Swelling  . Niacin Other (See Comments)    Muscle aches    Current Outpatient Prescriptions  Medication Sig Dispense Refill  . aspirin (ASPIRIN LOW DOSE) 81 MG EC tablet Take 81 mg by mouth daily.        . clopidogrel (PLAVIX) 75 MG tablet TAKE 1 TABLET EVERY DAY  30 tablet  3  . Coenzyme Q10 (CO Q-10) 200 MG CAPS Take 1 tablet by mouth daily.       Marland Kitchen HYDROcodone-acetaminophen (NORCO/VICODIN) 5-325 MG per tablet TAKE 1 TABLET BY MOUTH 3 TIMES A DAY AS NEEDED  90 tablet  0  . lisinopril (PRINIVIL,ZESTRIL) 20 MG tablet Take 0.5 tablets (10 mg total) by mouth daily.  30 tablet  1  . metoprolol (LOPRESSOR) 50 MG tablet TAKE 1/2 TABLET BY MOUTH TWICE DAILY  30 tablet  4  . Multiple Vitamins-Minerals ([employer] SPECTRAVITE) TABS Take 1 tablet by mouth daily.       . Omega-3 Fatty Acids (FISH OIL) 1000 MG CAPS Take 2 capsules by mouth at bedtime.       . polyethylene glycol (MIRALAX / GLYCOLAX) packet Take 17 g by mouth daily as needed (Constipation).       . potassium chloride SA (K-DUR,KLOR-CON) 20  MEQ tablet TAKE 2 TABS DAILY  60 tablet  4  . simvastatin (ZOCOR) 20 MG tablet TAKE 1 TABLET AT BEDTIME  30 tablet  4  . triamterene-hydrochlorothiazide (DYAZIDE) 37.5-25 MG per capsule TAKE ONE CAPSULE BY MOUTH EVERY MORNING  30 capsule  3   No current facility-administered medications for this visit.    Past Medical History  Diagnosis Date  . IGT (impaired glucose tolerance)   . Anxiety disorder   . Obesity   . Coronary atherosclerosis of native coronary artery     Multivessel status post CABG  . Essential hypertension, benign   . OSA (obstructive sleep apnea)   . Hyperlipidemia   . 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 Colon cancer May 2007  . Seizures     Age 46 or 3, no meds and no reoccurances  . Chronic back pain     Past Surgical History  Procedure Laterality Date  . Left inguinal  hernia repair  2005  . [name]  2007  . Coronary artery bypass graft  2004    x4  . Incisional hernia repair N/A 01/01/2013    Procedure: HERNIA REPAIR INCISIONAL;  Surgeon: Dalia Heading, MD;  Location: [facility];  Service: General;  Laterality: N/A;  umbilical area  . Insertion  of mesh N/A 01/01/2013    Procedure: INSERTION OF MESH;  Surgeon: Dalia Heading, MD;  Location: [facility];  Service: General;  Laterality: N/A;    Family History  Problem Relation Age of Onset  . Pancreatic cancer Mother   . Heart disease Father   . Heart disease Sister   . Skin cancer Sister   . Prostate cancer Sister   . Stroke Brother     Social History Craig Fields reports that he quit smoking about 32 years ago. His smoking use included Cigarettes. He has a 2.5 pack-year smoking history. He quit smokeless tobacco use about 6 months ago. His smokeless tobacco use included Chew. Craig Fields reports that he does not drink alcohol.  Review of Systems No palpitations or dizziness. No syncope. No orthopnea or PND. Reports stable appetite. No recurrent bleeding episodes. Otherwise as outlined  above.  Physical Examination Filed Vitals:   08/06/13 1054  BP: 150/69  Pulse: 72   Filed Weights   08/06/13 1054  Weight: 266 lb 8 oz (120.884 kg)   Overweight male, chronically ill-appearing, in no acute distress. HEENT: Conjunctiva and lids normal, oropharynx clear. Neck: Supple, no elevated JVP or carotid bruits, no thyromegaly. Lungs: Clear to auscultation, decreased breath sounds, nonlabored breathing at rest. Cardiac: Regular rate and rhythm, no S3 or significant systolic murmur, no pericardial rub. Abdomen: Soft, nontender, protuberant, healed surgical scars, bowel sounds present. Extremities: Trace edema, distal pulses 1-2+. Brace on left leg. Skin: Warm and dry. Musculoskeletal: No kyphosis. Neuropsychiatric: Alert and oriented x3, affect grossly appropriate.   Problem List and Plan   Coronary atherosclerosis of native coronary artery Symptomatically stable with multivessel disease status post CABG. Ischemic workup from last year reviewed, overall low risk. We will continue observation for now. Followup arranged in 6 months.  CVA WITH LEFT HEMIPARESIS Represents a chronic functional limitation.  Essential hypertension, benign Keep followup with Dr. Lodema Hong.  HYPERLIPIDEMIA On Zocor and omega-3 supplements, LDL under 100 in March. Followed by Dr. Lodema Hong.    Jonelle Sidle, M.D., F.A.C.C.

## 2013-08-06 NOTE — Assessment & Plan Note (Signed)
Symptomatically stable with multivessel disease status post CABG. Ischemic workup from last year reviewed, overall low risk. We will continue observation for now. Followup arranged in 6 months.

## 2013-08-11 ENCOUNTER — Other Ambulatory Visit: Payer: Self-pay | Admitting: Family Medicine

## 2013-08-14 DIAGNOSIS — L97409 Non-pressure chronic ulcer of unspecified heel and midfoot with unspecified severity: Secondary | ICD-10-CM | POA: Diagnosis not present

## 2013-08-15 ENCOUNTER — Other Ambulatory Visit: Payer: Self-pay

## 2013-08-15 MED ORDER — HYDROCODONE-ACETAMINOPHEN 5-325 MG PO TABS: ORAL_TABLET | ORAL | Status: DC

## 2013-08-27 ENCOUNTER — Telehealth: Payer: Self-pay | Admitting: Family Medicine

## 2013-08-27 NOTE — Telephone Encounter (Signed)
Please advise 

## 2013-08-27 NOTE — Telephone Encounter (Signed)
Noted and letter composed.  

## 2013-08-27 NOTE — Telephone Encounter (Signed)
pls type letter stating based on medical conditions , he is unable to carry out jury duty , I will sign

## 2013-09-09 NOTE — Assessment & Plan Note (Addendum)
Controlled when last checked, in March 2014. updated lab needed for next visit. Hyperlipidemia:Low fat diet discussed and encouraged.  \

## 2013-09-09 NOTE — Assessment & Plan Note (Signed)
Pt encouraged to limit carb intake and lose weight to reduce chnces of becoming diabetic. He is also to commit to regular physical activity as able

## 2013-09-09 NOTE — Assessment & Plan Note (Signed)
Pt desires transfer of care to local grp which is appropriate referral will be entered. Currently asymptomatic as far as CAD is concerned

## 2013-09-09 NOTE — Assessment & Plan Note (Signed)
Deteriorated. Patient re-educated about  the importance of commitment to a  minimum of 150 minutes of exercise per week. The importance of healthy food choices with portion control discussed. Encouraged to start a food diary, count calories and to consider  joining a support group. Sample diet sheets offered. Goals set by the patient for the next several months.    

## 2013-09-09 NOTE — Assessment & Plan Note (Signed)
Adequate though sub optimal control, no change in medication Pt to follow diet low in sodium and reduced carbs to improve blood pressure control

## 2013-09-09 NOTE — Assessment & Plan Note (Signed)
Unchanged, but benefits from therapy from time too time , to improve/mainatin functional status

## 2013-09-09 NOTE — Progress Notes (Signed)
   Subjective:    Patient ID: Craig Fields, male    DOB: 1940/12/21, 72 y.o.   MRN: [id number]  HPI Pt in for follow up of chronic illness. No new concerns, immunization is up to date, colon screen as rectal exam needed today. Requests referral to local cardiology grp for continuity of care due to ease of access.Denies any recent symptoms of unstable angina, or heart failure    Review of Systems See HPI Denies recent fever or chills. Denies sinus pressure, nasal congestion, ear pain or sore throat. Denies chest congestion, productive cough or wheezing. Denies chest pains, palpitations and leg swelling Denies abdominal pain, nausea, vomiting,diarrhea or constipation.   Denies dysuria, frequency, hesitancy or incontinence. 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 Denies uncontrolled  depression, anxiety or insomnia. Denies skin break down or rash.        Objective:   Physical Exam  Patient alert and oriented and in no cardiopulmonary distress.  HEENT: No facial asymmetry, EOMI, no sinus tenderness,  oropharynx pink and moist.  Neck adequate ROM, no JVD, no adenopathy.  Chest: Clear to auscultation bilaterally.  CVS: S1, S2 no murmurs, no S3.  ABD: Soft non tender. Bowel sounds normal. Rectal: no mass, heme negative stool. Prostate smooth and firm, no palpable nodule Ext: No edema  MS: Decreased  ROM spine, shoulders, hips and knees.  Skin: Intact, no ulcerations or rash noted.  Psych: Good eye contact, normal affect. Memory mildly impaired , not anxious or depressed appearing.  CNS: CN 2-12 intact, power decreased on left upper and lower extremities which is chronic       Assessment & Plan:

## 2013-09-25 ENCOUNTER — Telehealth: Payer: Self-pay | Admitting: *Deleted

## 2013-09-25 MED ORDER — HYDROCODONE-ACETAMINOPHEN 5-325 MG PO TABS: ORAL_TABLET | ORAL | Status: DC

## 2013-09-25 NOTE — Telephone Encounter (Signed)
Pt called wanting to get his RX refilled, pt takes hydrocodine. Please advise [phone]

## 2013-09-25 NOTE — Telephone Encounter (Signed)
Ready to collect

## 2013-09-28 DIAGNOSIS — L97509 Non-pressure chronic ulcer of other part of unspecified foot with unspecified severity: Secondary | ICD-10-CM | POA: Diagnosis not present

## 2013-10-17 ENCOUNTER — Other Ambulatory Visit: Payer: Self-pay | Admitting: Family Medicine

## 2013-10-23 DIAGNOSIS — L851 Acquired keratosis [keratoderma] palmaris et plantaris: Secondary | ICD-10-CM | POA: Diagnosis not present

## 2013-10-23 DIAGNOSIS — B351 Tinea unguium: Secondary | ICD-10-CM | POA: Diagnosis not present

## 2013-10-24 ENCOUNTER — Encounter: Payer: Medicare Other | Admitting: Family Medicine

## 2013-10-26 ENCOUNTER — Other Ambulatory Visit: Payer: Self-pay

## 2013-10-26 MED ORDER — HYDROCODONE-ACETAMINOPHEN 5-325 MG PO TABS: ORAL_TABLET | ORAL | Status: DC

## 2013-11-04 ENCOUNTER — Other Ambulatory Visit: Payer: Self-pay | Admitting: Family Medicine

## 2013-11-19 ENCOUNTER — Other Ambulatory Visit: Payer: Self-pay | Admitting: Family Medicine

## 2013-11-26 ENCOUNTER — Encounter (HOSPITAL_COMMUNITY): Payer: Self-pay | Admitting: Emergency Medicine

## 2013-11-26 ENCOUNTER — Emergency Department (HOSPITAL_COMMUNITY)
Admission: EM | Admit: 2013-11-26 | Discharge: 2013-11-26 | Disposition: A | Discharge status: Home / Self Care | Payer: Medicare Other | Attending: Emergency Medicine | Admitting: Emergency Medicine

## 2013-11-26 ENCOUNTER — Telehealth: Payer: Self-pay | Admitting: *Deleted

## 2013-11-26 ENCOUNTER — Emergency Department (HOSPITAL_COMMUNITY): Payer: Medicare Other

## 2013-11-26 DIAGNOSIS — Z7902 Long term (current) use of antithrombotics/antiplatelets: Secondary | ICD-10-CM | POA: Diagnosis not present

## 2013-11-26 DIAGNOSIS — G8929 Other chronic pain: Secondary | ICD-10-CM | POA: Diagnosis not present

## 2013-11-26 DIAGNOSIS — Z8673 Personal history of transient ischemic attack (TIA), and cerebral infarction without residual deficits: Secondary | ICD-10-CM | POA: Insufficient documentation

## 2013-11-26 DIAGNOSIS — Z79899 Other long term (current) drug therapy: Secondary | ICD-10-CM | POA: Insufficient documentation

## 2013-11-26 DIAGNOSIS — I10 Essential (primary) hypertension: Secondary | ICD-10-CM | POA: Diagnosis not present

## 2013-11-26 DIAGNOSIS — Z85038 Personal history of other malignant neoplasm of large intestine: Secondary | ICD-10-CM | POA: Insufficient documentation

## 2013-11-26 DIAGNOSIS — Z87891 Personal history of nicotine dependence: Secondary | ICD-10-CM | POA: Diagnosis not present

## 2013-11-26 DIAGNOSIS — Z8659 Personal history of other mental and behavioral disorders: Secondary | ICD-10-CM | POA: Insufficient documentation

## 2013-11-26 DIAGNOSIS — R079 Chest pain, unspecified: Secondary | ICD-10-CM | POA: Diagnosis not present

## 2013-11-26 DIAGNOSIS — Z7982 Long term (current) use of aspirin: Secondary | ICD-10-CM | POA: Diagnosis not present

## 2013-11-26 DIAGNOSIS — R072 Precordial pain: Secondary | ICD-10-CM | POA: Insufficient documentation

## 2013-11-26 DIAGNOSIS — I2510 Atherosclerotic heart disease of native coronary artery without angina pectoris: Secondary | ICD-10-CM | POA: Insufficient documentation

## 2013-11-26 DIAGNOSIS — E669 Obesity, unspecified: Secondary | ICD-10-CM | POA: Diagnosis not present

## 2013-11-26 DIAGNOSIS — Z951 Presence of aortocoronary bypass graft: Secondary | ICD-10-CM | POA: Diagnosis not present

## 2013-11-26 DIAGNOSIS — E785 Hyperlipidemia, unspecified: Secondary | ICD-10-CM | POA: Diagnosis not present

## 2013-11-26 LAB — CBC WITH DIFFERENTIAL/PLATELET
Basophils Absolute: 0 10*3/uL (ref 0.0–0.1)
Basophils Relative: 0 % (ref 0–1)
EOS PCT: 3 % (ref 0–5)
Eosinophils Absolute: 0.2 10*3/uL (ref 0.0–0.7)
HCT: 41.9 % (ref 39.0–52.0)
Hemoglobin: 14.2 g/dL (ref 13.0–17.0)
LYMPHS ABS: 1.3 10*3/uL (ref 0.7–4.0)
LYMPHS PCT: 19 % (ref 12–46)
MCH: 30.7 pg (ref 26.0–34.0)
MCHC: 33.9 g/dL (ref 30.0–36.0)
MCV: 90.7 fL (ref 78.0–100.0)
MONOS PCT: 9 % (ref 3–12)
Monocytes Absolute: 0.6 10*3/uL (ref 0.1–1.0)
Neutro Abs: 4.5 10*3/uL (ref 1.7–7.7)
Neutrophils Relative %: 69 % (ref 43–77)
PLATELETS: 154 10*3/uL (ref 150–400)
RBC: 4.62 MIL/uL (ref 4.22–5.81)
RDW: 13.4 % (ref 11.5–15.5)
WBC: 6.6 10*3/uL (ref 4.0–10.5)

## 2013-11-26 LAB — BASIC METABOLIC PANEL
BUN: 20 mg/dL (ref 6–23)
CO2: 27 meq/L (ref 19–32)
Calcium: 9.4 mg/dL (ref 8.4–10.5)
Chloride: 101 mEq/L (ref 96–112)
Creatinine, Ser: 0.96 mg/dL (ref 0.50–1.35)
GFR calc Af Amer: >90 mL/min (ref >90)
GFR calc non Af Amer: 80 mL/min — ABNORMAL LOW (ref >90)
GLUCOSE: 113 mg/dL — AB (ref 70–99)
Potassium: 4.1 mEq/L (ref 3.7–5.3)
SODIUM: 139 meq/L (ref 137–147)

## 2013-11-26 LAB — HEPATIC FUNCTION PANEL
ALK PHOS: 42 U/L (ref 39–117)
ALT: 17 U/L (ref 0–53)
AST: 22 U/L (ref 0–37)
Albumin: 3.8 g/dL (ref 3.5–5.2)
BILIRUBIN TOTAL: 0.4 mg/dL (ref 0.3–1.2)
Total Protein: 7.2 g/dL (ref 6.0–8.3)

## 2013-11-26 LAB — TROPONIN I
Troponin I: <0.3 ng/mL (ref <0.30)
Troponin I: <0.3 ng/mL (ref <0.30)

## 2013-11-26 NOTE — ED Notes (Addendum)
Pt reports intermittent 4/10 stabbing chest pain since last night. Denies associated SOB, diaphoresis, nausea and radiation of pain. EKG completed in triage and reviewed by MD.

## 2013-11-26 NOTE — ED Provider Notes (Signed)
CSN: [id number]     Arrival date & time 11/26/13  1344 History   This chart was scribed for Maudry Diego, MD by Era Bumpers, ED scribe. This patient was seen in room APA18/APA18 and the patient's care was started at 1344.  Chief Complaint  Patient presents with  . Chest Pain   Patient is a 73 y.o. male presenting with chest pain. The history is provided by the patient. No language interpreter was used.  Chest Pain Pain location:  Substernal area Pain quality: sharp   Pain radiates to:  Does not radiate Pain radiates to the back: no   Pain severity:  Mild Onset quality:  Sudden Duration:  1 day Timing:  Sporadic Progression:  Unchanged Chronicity:  New Relieved by:  Nothing Worsened by:  Nothing tried Ineffective treatments:  None tried Associated symptoms: no back pain, no cough, no fever, no nausea, no shortness of breath and not vomiting    HPI Comments: Craig Fields is a 73 y.o. male who presents to the Emergency Department complaining of sharp, midsternal CP, sporadic episodes since last PM which last a few seconds at a time. He states brief episode while here in exam room and last episode was in the car on the way here. He states previous similar episodes. 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  Past Medical History  Diagnosis Date  . IGT (impaired glucose tolerance)   . Anxiety disorder   . Obesity   . Coronary atherosclerosis of native coronary artery     Multivessel status post CABG  . Essential hypertension, benign   . OSA (obstructive sleep apnea)   . Hyperlipidemia   . 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 Colon cancer May 2007  . Seizures     Age 14 or 3, no meds and no reoccurances  . Chronic back pain    Past Surgical History  Procedure Laterality Date  . Left inguinal  hernia repair  2005  . [name]  2007  . Coronary artery bypass graft  2004    x4  . Incisional hernia repair N/A 01/01/2013    Procedure:  HERNIA REPAIR INCISIONAL;  Surgeon: Jamesetta So, MD;  Location: [facility];  Service: General;  Laterality: N/A;  umbilical area  . Insertion of mesh N/A 01/01/2013    Procedure: INSERTION OF MESH;  Surgeon: Jamesetta So, MD;  Location: [facility];  Service: General;  Laterality: N/A;   Family History  Problem Relation Age of Onset  . Pancreatic cancer Mother   . Heart disease Father   . Heart disease Sister   . Skin cancer Sister   . Prostate cancer Sister   . Stroke Brother    History  Substance Use Topics  . Smoking status: Former Smoker -- 0.50 packs/day for 5 years    Types: Cigarettes    Quit date: 12/27/1980  . Smokeless tobacco: Former Systems developer    Types: Chew    Quit date: 01/24/2013  . Alcohol Use: No    Review of Systems  Constitutional: Negative for fever and chills.  Respiratory: Negative for cough and shortness of breath.   Cardiovascular: Positive for chest pain.  Gastrointestinal: Negative for nausea and vomiting.  Musculoskeletal: Negative for back pain.  Skin: Negative for rash.  All other systems reviewed and are negative.   Allergies  Bee venom and Niacin  Home Medications   Current Outpatient Rx  Name  Route  Sig  Dispense  Refill  . aspirin (ASPIRIN LOW DOSE) 81 MG EC tablet   Oral   Take 81 mg by mouth daily.           . clopidogrel (PLAVIX) 75 MG tablet      TAKE 1 TABLET EVERY DAY   30 tablet   3   . clotrimazole-betamethasone (LOTRISONE) cream      APPLY TOPICALLY 2 (TWO) TIMES DAILY.   30 g   1   . Coenzyme Q10 (CO Q-10) 200 MG CAPS   Oral   Take 1 tablet by mouth daily.          Marland Kitchen HYDROcodone-acetaminophen (NORCO/VICODIN) 5-325 MG per tablet      TAKE 1 TABLET BY MOUTH 3 TIMES A DAY AS NEEDED   90 tablet   0   . lisinopril (PRINIVIL,ZESTRIL) 20 MG tablet      TAKE 1/2 TABLET BY MOUTH DAILY.   30 tablet   1   . metoprolol (LOPRESSOR) 50 MG tablet      TAKE 1/2 TABLET BY MOUTH TWICE DAILY   30 tablet   4   . Multiple  Vitamins-Minerals ([employer]S SPECTRAVITE) TABS   Oral   Take 1 tablet by mouth daily.          . Omega-3 Fatty Acids (FISH OIL) 1000 MG CAPS   Oral   Take 2 capsules by mouth at bedtime.          . polyethylene glycol powder (GLYCOLAX/MIRALAX) powder      TAKE 17 G BY MOUTH DAILY.   527 g   2   . potassium chloride SA (K-DUR,KLOR-CON) 20 MEQ tablet      TAKE 2 TABS DAILY   60 tablet   4   . simvastatin (ZOCOR) 20 MG tablet      TAKE 1 TABLET AT BEDTIME   30 tablet   4   . triamterene-hydrochlorothiazide (DYAZIDE) 37.5-25 MG per capsule      TAKE ONE CAPSULE BY MOUTH EVERY MORNING   30 capsule   1    Triage Vitals: BP 152/65  Pulse 71  Temp(Src) 98 F (36.7 C) (Oral)  Resp 20  SpO2 100%  Physical Exam  Nursing note and vitals reviewed. Constitutional: He is oriented to person, place, and time. He appears well-developed.  HENT:  Head: Normocephalic.  Eyes: Conjunctivae and EOM are normal. No scleral icterus.  Neck: Neck supple. No thyromegaly present.  Cardiovascular: Normal rate and regular rhythm.  Exam reveals no gallop and no friction rub.   No murmur heard. Pulmonary/Chest: No stridor. He has no wheezes. He has no rales. He exhibits no tenderness.  Abdominal: He exhibits no distension. There is no tenderness. There is no rebound.  Musculoskeletal: Normal range of motion. He exhibits no edema.  Lymphadenopathy:    He has no cervical adenopathy.  Neurological: He is oriented to person, place, and time. He exhibits normal muscle tone. Coordination normal.  Baseline is paralyzed of his left arm and left leg. contractures left arm. Profound weakness left leg   Skin: No rash noted. No erythema.  Psychiatric: He has a normal mood and affect. His behavior is normal.    ED Course  Procedures (including critical care time) DIAGNOSTIC STUDIES: Oxygen Saturation is 100% on room air, normal by my interpretation.    COORDINATION OF CARE: At 230 PM Discussed  treatment plan with patient which includes blood work, cardiac enzymes, EKG.  Patient agrees.   Labs Review Labs Reviewed  TROPONIN I  CBC WITH DIFFERENTIAL  BASIC METABOLIC PANEL  HEPATIC FUNCTION PANEL   Imaging Review No results found.   EKG Interpretation   Date/Time:  Monday November 26 2013 13:54:03 EDT Ventricular Rate:  67 PR Interval:  168 QRS Duration: 96 QT Interval:  412 QTC Calculation: 435 R Axis:   -30 Text Interpretation:  Normal sinus rhythm Left axis deviation Moderate  voltage criteria for LVH, may be normal variant Abnormal ECG When compared  with ECG of 27-Dec-2012 09:45, ST no longer depressed in Inferior leads T  wave inversion no longer evident in Inferior leads T wave amplitude has  increased in Lateral leads Confirmed by Rosabelle Jupin  MD, July Nickson ([id number]) on  11/26/2013 6:22:06 PM      MDM   Final diagnoses:  None    Non cardiac chest pain.  Troponin neg x two I personally performed the services described in this documentation, which was scribed in my presence. The recorded information has been reviewed and is accurate.      Maudry Diego, MD 11/26/13 Vernelle Emerald

## 2013-11-26 NOTE — Telephone Encounter (Signed)
Pt states he has chest pain in middle of his chest, not associated with SOB, Sweating, nausea, dizzy ness. This pain has been going on for 2 days on and off. He does not use nitro. States his stomach has been "growling".Pt States he has not took his Miralax in a couple of days and finally had a bowel movement yesterday. I encouraged him to see his primary for his medical issues. I advised pt if this gets any worse to go to the ER for a immediate evaluation.

## 2013-11-26 NOTE — Discharge Instructions (Signed)
Follow up with your md next week as scheduled.  Return sooner if needed

## 2013-11-26 NOTE — Telephone Encounter (Signed)
Pt states he has been having chest pain in center chest for two days on and off. Transferred call to [facility] d

## 2013-12-01 ENCOUNTER — Other Ambulatory Visit: Payer: Self-pay | Admitting: Family Medicine

## 2013-12-03 ENCOUNTER — Other Ambulatory Visit: Payer: Self-pay

## 2013-12-03 MED ORDER — HYDROCODONE-ACETAMINOPHEN 5-325 MG PO TABS: ORAL_TABLET | ORAL | Status: DC

## 2013-12-05 ENCOUNTER — Encounter: Payer: Self-pay | Admitting: Family Medicine

## 2013-12-05 ENCOUNTER — Ambulatory Visit (INDEPENDENT_AMBULATORY_CARE_PROVIDER_SITE_OTHER): Payer: Medicare Other | Admitting: Family Medicine

## 2013-12-05 VITALS — BP 138/74 | HR 74 | Resp 18 | Ht 68.75 in | Wt 264.0 lb

## 2013-12-05 DIAGNOSIS — Z0000 Encounter for general adult medical examination without abnormal findings: Secondary | ICD-10-CM | POA: Diagnosis not present

## 2013-12-05 NOTE — Progress Notes (Signed)
Subjective:    Patient ID: Craig Fields, male    DOB: [birth year], 73 y.o.   MRN: [id number]  HPI Preventive Screening-Counseling & Management   Patient present here today for a Medicare annual wellness visit.   Current Problems (verified)   Medications Prior to Visit Allergies (verified)   PAST HISTORY  Family History  Social History Married father and grandfather, close and supportive family . No current noicotine , alcohol or drug use   Risk Factors  Current exercise habits:  None regularly marked limitation in ambulation however encouraged to do chair exercisies  Dietary issues discussed:heart healthy low carb   Cardiac risk factors:   Depression Screen  (Note: if answer to either of the following is "Yes", a more complete depression screening is indicated)   Over the past two weeks, have you felt down, depressed or hopeless? No  Over the past two weeks, have you felt little interest or pleasure in doing things? No  Have you lost interest or pleasure in daily life? No  Do you often feel hopeless? No  Do you cry easily over simple problems? No   Activities of Daily Living  In your present state of health, do you have any difficulty performing the following activities?  Driving?: yes Managing money?: No Feeding yourself?:No Getting from bed to chair?:yes Climbing a flight of stairs?:yes Preparing food and eating?:yes Bathing or showering?:yes Getting dressed?:yes Getting to the toilet?:yes Using the toilet?:yes Moving around from place to place?: yes  Fall Risk Assessment In the past year have you fallen or had a near fall?:yes Are you currently taking any medications that make you dizzy?:No   Hearing Difficulties: No Do you often ask people to speak up or repeat themselves?:No Do you experience ringing or noises in your ears?:No Do you have difficulty understanding soft or whispered voices?yes at times  Cognitive Testing  Alert? Yes Normal  Appearance?Yes  Oriented to person? Yes Place? Yes  Time? Yes  Displays appropriate judgment?Yes  Can read the correct time from a watch face? yes Are you having problems remembering things?No  Advanced Directives have been discussed with the patient?Yes, full code    List the Names of Other Physician/Practitioners you currently use:    Indicate any recent Medical Services you may have received from other than [facility] providers in the past year (date may be approximate).   Assessment:    Annual Wellness Exam   Plan:    During the course of the visit the patient was educated and counseled about appropriate screening and preventive services including:  A healthy diet is rich in fruit, vegetables and whole grains. Poultry fish, nuts and beans are a healthy choice for protein rather then red meat. A low sodium diet and drinking 64 ounces of water daily is generally recommended. Oils and sweet should be limited. Carbohydrates especially for those who are diabetic or overweight, should be limited to 30-45 gram per meal. It is important to eat on a regular schedule, at least 3 times daily. Snacks should be primarily fruits, vegetables or nuts. It is important that you exercise regularly at least 30 minutes 5 times a week. If you develop chest pain, have severe difficulty breathing, or feel very tired, stop exercising immediately and seek medical attention  Immunization reviewed and updated. Cancer screening reviewed and updated    Patient Instructions (the written plan) was given to the patient.  Medicare Attestation  I have personally reviewed:  [name] medical and social history  Their use of alcohol, tobacco or illicit drugs  Their current medications and supplements  [name] functional ability including ADLs,fall risks, home safety risks, cognitive, and hearing and visual impairment  Diet and physical activities  Evidence for depression or mood disorders  [name]  weight, height, BMI, and visual acuity have been recorded in the chart. I have made referrals, counseling, and provided education to the patient based on review of the above and I have provided the patient with a written personalized care plan for preventive services.      Review of Systems     Objective:   Physical Exam        Assessment & Plan:

## 2013-12-05 NOTE — Patient Instructions (Addendum)
Fall Prevention and Home Safety Falls cause injuries and can affect all age groups. It is possible to use preventive measures to significantly decrease the likelihood of falls. There are many simple measures which can make your home safer and prevent falls. OUTDOORS  Repair cracks and edges of walkways and driveways.  Remove high doorway thresholds.  Trim shrubbery on the main path into your home.  Have good outside lighting.  Clear walkways of tools, rocks, debris, and clutter.  Check that handrails are not broken and are securely fastened. Both sides of steps should have handrails.  Have leaves, snow, and ice cleared regularly.  Use sand or salt on walkways during winter months.  In the garage, clean up grease or oil spills. BATHROOM  Install night lights.  Install grab bars by the toilet and in the tub and shower.  Use non-skid mats or decals in the tub or shower.  Place a plastic non-slip stool in the shower to sit on, if needed.  Keep floors dry and clean up all water on the floor immediately.  Remove soap buildup in the tub or shower on a regular basis.  Secure bath mats with non-slip, double-sided rug tape.  Remove throw rugs and tripping hazards from the floors. BEDROOMS  Install night lights.  Make sure a bedside light is easy to reach.  Do not use oversized bedding.  Keep a telephone by your bedside.  Have a firm chair with side arms to use for getting dressed.  Remove throw rugs and tripping hazards from the floor. KITCHEN  Keep handles on pots and pans turned toward the center of the stove. Use back burners when possible.  Clean up spills quickly and allow time for drying.  Avoid walking on wet floors.  Avoid hot utensils and knives.  Position shelves so they are not too high or low.  Place commonly used objects within easy reach.  If necessary, use a sturdy step stool with a grab bar when reaching.  Keep electrical cables out of the  way.  Do not use floor polish or wax that makes floors slippery. If you must use wax, use non-skid floor wax.  Remove throw rugs and tripping hazards from the floor. STAIRWAYS  Never leave objects on stairs.  Place handrails on both sides of stairways and use them. Fix any loose handrails. Make sure handrails on both sides of the stairways are as long as the stairs.  Check carpeting to make sure it is firmly attached along stairs. Make repairs to worn or loose carpet promptly.  Avoid placing throw rugs at the top or bottom of stairways, or properly secure the rug with carpet tape to prevent slippage. Get rid of throw rugs, if possible.  Have an electrician put in a light switch at the top and bottom of the stairs. OTHER FALL PREVENTION TIPS  Wear low-heel or rubber-soled shoes that are supportive and fit well. Wear closed toe shoes.  When using a stepladder, make sure it is fully opened and both spreaders are firmly locked. Do not climb a closed stepladder.  Add color or contrast paint or tape to grab bars and handrails in your home. Place contrasting color strips on first and last steps.  Learn and use mobility aids as needed. Install an electrical emergency response system.  Turn on lights to avoid dark areas. Replace light bulbs that burn out immediately. Get light switches that glow.  Arrange furniture to create clear pathways. Keep furniture in the same place.    Firmly attach carpet with non-skid or double-sided tape.  Eliminate uneven floor surfaces.  Select a carpet pattern that does not visually hide the edge of steps.  Be aware of all pets. OTHER HOME SAFETY TIPS  Set the water temperature for 120 F (48.8 C).  Keep emergency numbers on or near the telephone.  Keep smoke detectors on every level of the home and near sleeping areas. Document Released: 08/20/2002 Document Revised: 02/29/2012 Document Reviewed: 11/19/2011 [facility] Patient Information 2014  [place].   F/u in 4 month, please call if you need me before  If you decide on a wheelchair, call for referral to OT  Next colonoscopy is due 01/2015  Call for your eye exam

## 2013-12-07 ENCOUNTER — Other Ambulatory Visit: Payer: Self-pay | Admitting: Family Medicine

## 2013-12-16 NOTE — Assessment & Plan Note (Addendum)
Annual exam as documented. Counseling done  re healthy lifestyle involving commitment to 150 minutes exercise per week, heart healthy diet, and attaining healthy weight.The importance of adequate sleep also discussed. Regular seat belt use and safe storage  of firearms if patient has them, is also discussed. Changes in health habits are decided on by the patient with goals and time frames  set for achieving them.

## 2014-01-01 DIAGNOSIS — B351 Tinea unguium: Secondary | ICD-10-CM | POA: Diagnosis not present

## 2014-01-01 DIAGNOSIS — L851 Acquired keratosis [keratoderma] palmaris et plantaris: Secondary | ICD-10-CM | POA: Diagnosis not present

## 2014-01-02 ENCOUNTER — Other Ambulatory Visit: Payer: Self-pay | Admitting: Family Medicine

## 2014-01-07 ENCOUNTER — Other Ambulatory Visit: Payer: Self-pay

## 2014-01-07 ENCOUNTER — Other Ambulatory Visit: Payer: Self-pay | Admitting: Family Medicine

## 2014-01-07 MED ORDER — HYDROCODONE-ACETAMINOPHEN 5-325 MG PO TABS: ORAL_TABLET | ORAL | Status: DC

## 2014-01-30 ENCOUNTER — Encounter: Payer: Self-pay | Admitting: Cardiology

## 2014-01-30 ENCOUNTER — Ambulatory Visit (INDEPENDENT_AMBULATORY_CARE_PROVIDER_SITE_OTHER): Payer: Medicare Other | Admitting: Cardiology

## 2014-01-30 ENCOUNTER — Other Ambulatory Visit: Payer: Self-pay

## 2014-01-30 VITALS — BP 130/72 | HR 80 | Ht 69.0 in | Wt 264.0 lb

## 2014-01-30 DIAGNOSIS — I10 Essential (primary) hypertension: Secondary | ICD-10-CM | POA: Diagnosis not present

## 2014-01-30 DIAGNOSIS — I2510 Atherosclerotic heart disease of native coronary artery without angina pectoris: Secondary | ICD-10-CM | POA: Diagnosis not present

## 2014-01-30 DIAGNOSIS — E785 Hyperlipidemia, unspecified: Secondary | ICD-10-CM

## 2014-01-30 MED ORDER — HYDROCODONE-ACETAMINOPHEN 5-325 MG PO TABS: ORAL_TABLET | ORAL | Status: DC

## 2014-01-30 NOTE — Patient Instructions (Signed)
Your physician wants you to follow-up in: 6 months You will receive a reminder letter in the mail two months in advance. If you don't receive a letter, please call our office to schedule the follow-up appointment.     Your physician recommends that you continue on your current medications as directed. Please refer to the Current Medication list given to you today.      Thank you for choosing [facility] !        

## 2014-01-30 NOTE — Assessment & Plan Note (Signed)
Symptomatically stable with history of multivessel disease status post CABG. Ischemic testing low risk within the last 2 years. Continue observation.

## 2014-01-30 NOTE — Assessment & Plan Note (Signed)
Blood pressure rechecked as above, no changes made today.

## 2014-01-30 NOTE — Progress Notes (Signed)
Clinical Summary Mr. Spong is a 73 y.o.male last seen in November 2014. Record review finds ER evaluation in March with noncardiac chest pain. Troponin I levels were normal at that time and ECG showed sinus rhythm with leftward axis and LVH.  He is here with his wife today, denies any definite angina, no nitroglycerin use. Remains limited, left leg brace in place and left arm contracture after prior stroke.  Echocardiogram from July 2014 revealed moderate LVH with LVEF 18-84%, grade 1 diastolic dysfunction, mild mitral regurgitation, mild left atrial enlargement. Lexiscan Myoview from January 2013 was overall low risk, basal septal and lateral thinning present which was felt to be either due to attenuation although small region of ischemia not excluded. LVEF 53%.  I rechecked his blood pressure and it came down after resting just a few minutes. He reports compliance with his medications. States he will be having a physical with lab work with Dr. Moshe Cipro in June.   Allergies  Allergen Reactions  . Bee Venom Swelling  . Niacin Other (See Comments)    Muscle aches    Current Outpatient Prescriptions  Medication Sig Dispense Refill  . aspirin (ASPIRIN LOW DOSE) 81 MG EC tablet Take 81 mg by mouth daily.        . clopidogrel (PLAVIX) 75 MG tablet TAKE 1 TABLET EVERY DAY  30 tablet  4  . clotrimazole-betamethasone (LOTRISONE) cream APPLY TOPICALLY 2 (TWO) TIMES DAILY.  30 g  1  . Coenzyme Q10 (CO Q-10) 200 MG CAPS Take 1 tablet by mouth daily.       Marland Kitchen HYDROcodone-acetaminophen (NORCO/VICODIN) 5-325 MG per tablet TAKE 1 TABLET BY MOUTH 3 TIMES A DAY AS NEEDED  90 tablet  0  . lisinopril (PRINIVIL,ZESTRIL) 20 MG tablet TAKE 1/2 TABLET BY MOUTH DAILY.  30 tablet  1  . metoprolol (LOPRESSOR) 50 MG tablet TAKE 1/2 TABLET BY MOUTH TWICE DAILY  30 tablet  4  . Multiple Vitamins-Minerals ([employer]S SPECTRAVITE) TABS Take 1 tablet by mouth daily.       . Omega-3 Fatty Acids (FISH OIL) 1000 MG CAPS Take 2  capsules by mouth at bedtime.       . polyethylene glycol powder (GLYCOLAX/MIRALAX) powder TAKE 17 G BY MOUTH DAILY.  527 g  2  . potassium chloride SA (K-DUR,KLOR-CON) 20 MEQ tablet TAKE 2 TABLETS EVERY DAY  60 tablet  4  . simvastatin (ZOCOR) 20 MG tablet TAKE 1 TABLET AT BEDTIME  30 tablet  4  . triamterene-hydrochlorothiazide (DYAZIDE) 37.5-25 MG per capsule TAKE ONE CAPSULE BY MOUTH EVERY MORNING  30 capsule  4   No current facility-administered medications for this visit.    Past Medical History  Diagnosis Date  . IGT (impaired glucose tolerance)   . Anxiety disorder   . Obesity   . Coronary atherosclerosis of native coronary artery     Multivessel status post CABG  . Essential hypertension, benign   . OSA (obstructive sleep apnea)   . Hyperlipidemia   . 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 Colon cancer May 2007  . Seizures     Age 58 or 3, no meds and no reoccurances  . Chronic back pain     Social History Mr. Ebersole reports that he quit smoking about 33 years ago. His smoking use included Cigarettes. He has a 2.5 pack-year smoking history. He quit smokeless tobacco use about a year ago. His smokeless tobacco use included Chew.  Mr. Kren reports that he does not drink alcohol.  Review of Systems No palpitations, dizziness, syncope. Otherwise as outlined.  Physical Examination Filed Vitals:   01/30/14 1108  BP: 130/72  Pulse: 80   Filed Weights   01/30/14 1056  Weight: 264 lb (119.75 kg)    Overweight male, chronically ill-appearing, in no acute distress.  HEENT: Conjunctiva and lids normal, oropharynx clear.  Neck: Supple, no elevated JVP or carotid bruits, no thyromegaly.  Lungs: Clear to auscultation, decreased breath sounds, nonlabored breathing at rest.  Cardiac: Regular rate and rhythm, no S3 or significant systolic murmur, no pericardial rub.  Abdomen: Soft, nontender, protuberant, healed surgical scars, bowel sounds present.  Extremities:  Trace edema, distal pulses 1-2+. Brace on left leg.  Skin: Warm and dry.  Musculoskeletal: No kyphosis.  Neuropsychiatric: Alert and oriented x3, affect grossly appropriate.   Problem List and Plan   Coronary atherosclerosis of native coronary artery Symptomatically stable with history of multivessel disease status post CABG. Ischemic testing low risk within the last 2 years. Continue observation.  Essential hypertension, benign Blood pressure rechecked as above, no changes made today.  HYPERLIPIDEMIA Continues on Zocor, keep follow up with Dr. Moshe Cipro.    Satira Sark, M.D., F.A.C.C.

## 2014-01-30 NOTE — Assessment & Plan Note (Signed)
Continues on Zocor, keep follow up with Dr. Moshe Cipro.

## 2014-02-02 ENCOUNTER — Other Ambulatory Visit: Payer: Self-pay | Admitting: Family Medicine

## 2014-02-06 ENCOUNTER — Ambulatory Visit: Payer: Medicare Other | Admitting: Cardiology

## 2014-02-27 ENCOUNTER — Other Ambulatory Visit: Payer: Self-pay

## 2014-02-27 MED ORDER — HYDROCODONE-ACETAMINOPHEN 5-325 MG PO TABS: ORAL_TABLET | ORAL | Status: DC

## 2014-03-12 DIAGNOSIS — L851 Acquired keratosis [keratoderma] palmaris et plantaris: Secondary | ICD-10-CM | POA: Diagnosis not present

## 2014-03-12 DIAGNOSIS — I739 Peripheral vascular disease, unspecified: Secondary | ICD-10-CM | POA: Diagnosis not present

## 2014-03-12 DIAGNOSIS — B351 Tinea unguium: Secondary | ICD-10-CM | POA: Diagnosis not present

## 2014-03-29 ENCOUNTER — Other Ambulatory Visit: Payer: Self-pay

## 2014-03-29 MED ORDER — HYDROCODONE-ACETAMINOPHEN 5-325 MG PO TABS: ORAL_TABLET | ORAL | Status: DC

## 2014-04-10 ENCOUNTER — Encounter: Payer: Self-pay | Admitting: Family Medicine

## 2014-04-10 ENCOUNTER — Ambulatory Visit (INDEPENDENT_AMBULATORY_CARE_PROVIDER_SITE_OTHER): Payer: Medicare Other | Admitting: Family Medicine

## 2014-04-10 VITALS — BP 120/70 | HR 67 | Resp 16 | Ht 68.0 in | Wt 278.0 lb

## 2014-04-10 DIAGNOSIS — Z125 Encounter for screening for malignant neoplasm of prostate: Secondary | ICD-10-CM

## 2014-04-10 DIAGNOSIS — R262 Difficulty in walking, not elsewhere classified: Secondary | ICD-10-CM

## 2014-04-10 DIAGNOSIS — Z23 Encounter for immunization: Secondary | ICD-10-CM | POA: Insufficient documentation

## 2014-04-10 DIAGNOSIS — R7309 Other abnormal glucose: Secondary | ICD-10-CM

## 2014-04-10 DIAGNOSIS — I10 Essential (primary) hypertension: Secondary | ICD-10-CM | POA: Diagnosis not present

## 2014-04-10 DIAGNOSIS — I2510 Atherosclerotic heart disease of native coronary artery without angina pectoris: Secondary | ICD-10-CM

## 2014-04-10 DIAGNOSIS — E785 Hyperlipidemia, unspecified: Secondary | ICD-10-CM | POA: Diagnosis not present

## 2014-04-10 DIAGNOSIS — R7303 Prediabetes: Secondary | ICD-10-CM

## 2014-04-10 DIAGNOSIS — E669 Obesity, unspecified: Secondary | ICD-10-CM

## 2014-04-10 MED ORDER — POTASSIUM CHLORIDE CRYS ER 20 MEQ PO TBCR: EXTENDED_RELEASE_TABLET | ORAL | Status: DC

## 2014-04-10 MED ORDER — LISINOPRIL 20 MG PO TABS: ORAL_TABLET | ORAL | Status: DC

## 2014-04-10 MED ORDER — CLOPIDOGREL BISULFATE 75 MG PO TABS: ORAL_TABLET | ORAL | Status: DC

## 2014-04-10 MED ORDER — TRIAMTERENE-HCTZ 37.5-25 MG PO CAPS: ORAL_CAPSULE | ORAL | Status: DC

## 2014-04-10 MED ORDER — METOPROLOL TARTRATE 50 MG PO TABS: ORAL_TABLET | ORAL | Status: DC

## 2014-04-10 NOTE — Patient Instructions (Addendum)
F/u with rectal in end October, call if you need me before  prevnar today  Fasting lipid , HBA1C, tSH and PSA in the next 1 week   Pls continue to eat mainly vegetables, cut back on bread, still have biscuits with your brother, and continue to keep moving   No changes in medication

## 2014-04-10 NOTE — Assessment & Plan Note (Signed)
Controlled, no change in medication  

## 2014-04-10 NOTE — Progress Notes (Signed)
   Subjective:    Patient ID: Craig Fields, male    DOB: 1941-01-10, 73 y.o.   MRN: [id number]  HPI The PT is here for follow up and re-evaluation of chronic medical conditions, medication management and review of any available recent lab and radiology data.  Preventive health is updated, specifically  Cancer screening and Immunization.   Questions or concerns regarding consultations or procedures which the PT has had in the interim are  addressed. The PT denies any adverse reactions to current medications since the last visit.  There are no new concerns.  There are no specific complaints       Review of Systems See HPI Denies recent fever or chills. Denies sinus pressure, nasal congestion, ear pain or sore throat. Denies chest congestion, productive cough or wheezing. Denies chest pains, palpitations and leg swelling Denies abdominal pain, nausea, vomiting,diarrhea or constipation.   Denies dysuria, frequency, hesitancy or incontinence. Chronic  joint pain,  and limitation in mobility. Denies headaches, seizures, numbness, or tingling. Denies depression, anxiety or insomnia. Denies skin break down or rash.        Objective:   Physical Exam  BP 120/70  Pulse 67  Resp 16  Ht 5\' 8"  (1.727 m)  Wt 278 lb (126.1 kg)  BMI 42.28 kg/m2  SpO2 95% Patient alert and oriented and in no cardiopulmonary distress.  HEENT: No facial asymmetry, EOMI,   oropharynx pink and moist.  Neck supple no JVD, no mass.  Chest: Clear to auscultation bilaterally.  CVS: S1, S2 no murmurs, no S3.Regular rate.  ABD: Soft non tender.   Ext: No edema  MS: Decreased  ROM spine, shoulders, hips and knees.  Skin: Intact, no ulcerations or rash noted.  Psych: Good eye contact, normal affect. Memory intact not anxious or depressed appearing.  CNS: CN 2-12 intact, grade 4 power in left upper and lower extremities       Assessment & Plan:  Essential hypertension, benign Controlled, no  change in medication   Coronary atherosclerosis of native coronary artery Stable, no symptoms of heart failure and denies chest pain or exertional fatigue  Need for vaccination with 13-polyvalent pneumococcal conjugate vaccine Vaccine administered  HYPERLIPIDEMIA Hyperlipidemia:Low fat diet discussed and encouraged.  Controlled, no change in medication TG slightly elevated  Prediabetes .improved blood sugar level Pt applauded on this and encouraged to continue carb restricted diet , and continue to increase physical activity as tolerated  Difficulty in walking(719.7) Reports improvement in mobility since pT earlier this year, which improves his mental health and sense of wellbeing also He has had no falls  OBESITY Deteriorated. Patient re-educated about  the importance of commitment to .daily physical activity as able is stressed. The importance of healthy food choices with portion control discussed.

## 2014-04-10 NOTE — Assessment & Plan Note (Addendum)
Stable, no symptoms of heart failure and denies chest pain or exertional fatigue

## 2014-04-10 NOTE — Assessment & Plan Note (Signed)
Vaccine administered.

## 2014-04-10 NOTE — Assessment & Plan Note (Addendum)
Hyperlipidemia:Low fat diet discussed and encouraged.  Controlled, no change in medication TG slightly elevated

## 2014-04-12 DIAGNOSIS — E785 Hyperlipidemia, unspecified: Secondary | ICD-10-CM | POA: Diagnosis not present

## 2014-04-12 DIAGNOSIS — R7309 Other abnormal glucose: Secondary | ICD-10-CM | POA: Diagnosis not present

## 2014-04-12 DIAGNOSIS — I10 Essential (primary) hypertension: Secondary | ICD-10-CM | POA: Diagnosis not present

## 2014-04-12 DIAGNOSIS — Z125 Encounter for screening for malignant neoplasm of prostate: Secondary | ICD-10-CM | POA: Diagnosis not present

## 2014-04-12 LAB — LIPID PANEL
Cholesterol: 137 mg/dL (ref 0–200)
HDL: 38 mg/dL — ABNORMAL LOW (ref >39)
LDL CALC: 68 mg/dL (ref 0–99)
Total CHOL/HDL Ratio: 3.6 Ratio
Triglycerides: 153 mg/dL — ABNORMAL HIGH (ref <150)
VLDL: 31 mg/dL (ref 0–40)

## 2014-04-12 LAB — HEMOGLOBIN A1C
HEMOGLOBIN A1C: 6 % — AB (ref <5.7)
MEAN PLASMA GLUCOSE: 126 mg/dL — AB (ref <117)

## 2014-04-13 LAB — PSA, MEDICARE: PSA: 2.93 ng/mL (ref <=4.00)

## 2014-04-13 LAB — TSH: TSH: 3.938 u[IU]/mL (ref 0.350–4.500)

## 2014-05-02 ENCOUNTER — Other Ambulatory Visit: Payer: Self-pay

## 2014-05-02 MED ORDER — HYDROCODONE-ACETAMINOPHEN 5-325 MG PO TABS: ORAL_TABLET | ORAL | Status: DC

## 2014-05-24 DIAGNOSIS — B351 Tinea unguium: Secondary | ICD-10-CM | POA: Diagnosis not present

## 2014-05-24 DIAGNOSIS — L851 Acquired keratosis [keratoderma] palmaris et plantaris: Secondary | ICD-10-CM | POA: Diagnosis not present

## 2014-05-24 DIAGNOSIS — I739 Peripheral vascular disease, unspecified: Secondary | ICD-10-CM | POA: Diagnosis not present

## 2014-05-28 NOTE — Assessment & Plan Note (Signed)
.  improved blood sugar level Pt applauded on this and encouraged to continue carb restricted diet , and continue to increase physical activity as tolerated

## 2014-05-28 NOTE — Assessment & Plan Note (Signed)
Deteriorated. Patient re-educated about  the importance of commitment to .daily physical activity as able is stressed. The importance of healthy food choices with portion control discussed.

## 2014-05-28 NOTE — Assessment & Plan Note (Signed)
Reports improvement in mobility since pT earlier this year, which improves his mental health and sense of wellbeing also He has had no falls

## 2014-06-18 ENCOUNTER — Encounter (INDEPENDENT_AMBULATORY_CARE_PROVIDER_SITE_OTHER): Payer: Self-pay

## 2014-06-18 ENCOUNTER — Ambulatory Visit (INDEPENDENT_AMBULATORY_CARE_PROVIDER_SITE_OTHER): Payer: Medicare Other

## 2014-06-18 DIAGNOSIS — Z23 Encounter for immunization: Secondary | ICD-10-CM

## 2014-06-26 ENCOUNTER — Other Ambulatory Visit: Payer: Self-pay

## 2014-06-26 MED ORDER — HYDROCODONE-ACETAMINOPHEN 5-325 MG PO TABS: ORAL_TABLET | ORAL | Status: DC

## 2014-07-23 ENCOUNTER — Other Ambulatory Visit: Payer: Self-pay | Admitting: Family Medicine

## 2014-07-30 ENCOUNTER — Ambulatory Visit (INDEPENDENT_AMBULATORY_CARE_PROVIDER_SITE_OTHER): Payer: Medicare Other | Admitting: Cardiology

## 2014-07-30 ENCOUNTER — Encounter: Payer: Self-pay | Admitting: Cardiology

## 2014-07-30 VITALS — BP 158/98 | HR 68 | Ht 69.0 in | Wt 278.0 lb

## 2014-07-30 DIAGNOSIS — I10 Essential (primary) hypertension: Secondary | ICD-10-CM | POA: Diagnosis not present

## 2014-07-30 DIAGNOSIS — I2510 Atherosclerotic heart disease of native coronary artery without angina pectoris: Secondary | ICD-10-CM | POA: Diagnosis not present

## 2014-07-30 DIAGNOSIS — E782 Mixed hyperlipidemia: Secondary | ICD-10-CM

## 2014-07-30 NOTE — Assessment & Plan Note (Signed)
Blood pressure elevated today, also component of whitecoat hypertension. I asked him to continue his current medications, watch salt in the diet, and keep follow-up with Dr. Moshe Cipro.

## 2014-07-30 NOTE — Assessment & Plan Note (Signed)
Stable without active angina symptoms on medical therapy. He is status post CABG in 2004 and had a low risk Cardiolite in 2013. Continue observation. Follow-up in 6 months.

## 2014-07-30 NOTE — Progress Notes (Signed)
Reason for visit: CAD, hypertension, hyperlipidemia  Clinical Summary [name] is a 73 y.o.male last seen in May. He is here with his wife for a regular visit. He reports no angina symptoms or nitroglycerin use. Typically not able to the very active due to residua from prior stroke, he has a brace on his left leg, uses a cane, and has a left arm contracture. He reports compliance with his cardiac medications which are outlined below.  Lipid panel from July showed cholesterol 137, triglycerides 153, HDL 38, and LDL 68. He continues to tolerate simvastatin.  Echocardiogram from July 2014 revealed moderate LVH with LVEF 18-56%, grade 1 diastolic dysfunction, mild mitral regurgitation, mild left atrial enlargement. Lexiscan Myoview from January 2013 was overall low risk, basal septal and lateral thinning present which was felt to be either due to attenuation although small region of ischemia not excluded. LVEF 53%.   Allergies  Allergen Reactions  . Bee Venom Swelling  . Niacin Other (See Comments)    Muscle aches    Current Outpatient Prescriptions  Medication Sig Dispense Refill  . aspirin (ASPIRIN LOW DOSE) 81 MG EC tablet Take 81 mg by mouth daily.      . clopidogrel (PLAVIX) 75 MG tablet TAKE 1 TABLET EVERY DAY 30 tablet 4  . clotrimazole-betamethasone (LOTRISONE) cream APPLY TOPICALLY 2 (TWO) TIMES DAILY. 30 g 1  . Coenzyme Q10 (CO Q-10) 200 MG CAPS Take 1 tablet by mouth daily. 400 mg    . HYDROcodone-acetaminophen (NORCO/VICODIN) 5-325 MG per tablet TAKE 1 TABLET BY MOUTH 3 TIMES A DAY AS NEEDED 90 tablet 0  . lisinopril (PRINIVIL,ZESTRIL) 20 MG tablet TAKE 1/2 TABLET BY MOUTH DAILY. 30 tablet 4  . metoprolol (LOPRESSOR) 50 MG tablet TAKE 1/2 TABLET BY MOUTH TWICE DAILY 30 tablet 4  . Multiple Vitamins-Minerals ([employer] SPECTRAVITE) TABS Take 1 tablet by mouth daily.     . Omega-3 Fatty Acids (FISH OIL) 1000 MG CAPS Take 2 capsules by mouth at bedtime. 1200 mg    . polyethylene  glycol powder (GLYCOLAX/MIRALAX) powder TAKE 17 G BY MOUTH DAILY. 527 g 2  . potassium chloride SA (K-DUR,KLOR-CON) 20 MEQ tablet TAKE 2 TABLETS EVERY DAY 60 tablet 4  . simvastatin (ZOCOR) 20 MG tablet TAKE 1 TABLET BY MOUTH AT BEDTIME 30 tablet 4  . triamterene-hydrochlorothiazide (DYAZIDE) 37.5-25 MG per capsule TAKE ONE CAPSULE BY MOUTH EVERY MORNING 30 capsule 4   No current facility-administered medications for this visit.    Past Medical History  Diagnosis Date  . IGT (impaired glucose tolerance)   . Anxiety disorder   . Obesity   . Coronary atherosclerosis of native coronary artery     Multivessel status post CABG  . Essential hypertension, benign   . OSA (obstructive sleep apnea)   . Hyperlipidemia   . 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 Colon cancer May 2007  . Seizures     Age 56 or 3, no meds and no reoccurances  . Chronic back pain     Past Surgical History  Procedure Laterality Date  . Left inguinal  hernia repair  2005  . [name]  2007  . Coronary artery bypass graft  2004    x4  . Incisional hernia repair N/A 01/01/2013    Procedure: HERNIA REPAIR INCISIONAL;  Surgeon: Craig So, MD;  Location: [facility];  Service: General;  Laterality: N/A;  umbilical area  . Insertion of mesh N/A  01/01/2013    Procedure: INSERTION OF MESH;  Surgeon: Craig So, MD;  Location: [facility];  Service: General;  Laterality: N/A;    Social History [name] reports that he quit smoking about 33 years ago. His smoking use included Cigarettes. He has a 2.5 pack-year smoking history. He quit smokeless tobacco use about 18 months ago. His smokeless tobacco use included Chew. [name] reports that he does not drink alcohol.  Review of Systems Complete review of systems negative except as otherwise outlined in the clinical summary.  Physical Examination Filed Vitals:   07/30/14 0957  BP: 158/98  Pulse: 68   Filed Weights   07/30/14 0957  Weight: 278 lb  (126.1 kg)    Overweight male, chronically ill-appearing, in no acute distress.  HEENT: Conjunctiva and lids normal, oropharynx clear.  Neck: Supple, no elevated JVP or carotid bruits, no thyromegaly.  Lungs: Clear to auscultation, decreased breath sounds, nonlabored breathing at rest.  Cardiac: Regular rate and rhythm, no S3 or significant systolic murmur, no pericardial rub.  Abdomen: Soft, nontender, protuberant, healed surgical scars, bowel sounds present.  Extremities: Trace edema, distal pulses 1-2+. Brace on left leg.    Problem List and Plan   Coronary atherosclerosis of native coronary artery Stable without active angina symptoms on medical therapy. He is status post CABG in 2004 and had a low risk Cardiolite in 2013. Continue observation. Follow-up in 6 months.  Essential hypertension, benign Blood pressure elevated today, also component of whitecoat hypertension. I asked him to continue his current medications, watch salt in the diet, and keep follow-up with Dr. Moshe Fields.  Mixed hyperlipidemia LDL 68, on simvastatin.    Satira Sark, M.D., F.A.C.C.

## 2014-07-30 NOTE — Assessment & Plan Note (Signed)
LDL 68, on simvastatin.

## 2014-07-30 NOTE — Patient Instructions (Signed)
Your physician wants you to follow-up in: 6 months You will receive a reminder letter in the mail two months in advance. If you don't receive a letter, please call our office to schedule the follow-up appointment.     Your physician recommends that you continue on your current medications as directed. Please refer to the Current Medication list given to you today.      Thank you for choosing  [facility] !        

## 2014-08-06 DIAGNOSIS — L851 Acquired keratosis [keratoderma] palmaris et plantaris: Secondary | ICD-10-CM | POA: Diagnosis not present

## 2014-08-06 DIAGNOSIS — B351 Tinea unguium: Secondary | ICD-10-CM | POA: Diagnosis not present

## 2014-08-06 DIAGNOSIS — I739 Peripheral vascular disease, unspecified: Secondary | ICD-10-CM | POA: Diagnosis not present

## 2014-08-13 ENCOUNTER — Other Ambulatory Visit: Payer: Self-pay | Admitting: Family Medicine

## 2014-08-15 ENCOUNTER — Ambulatory Visit (INDEPENDENT_AMBULATORY_CARE_PROVIDER_SITE_OTHER): Payer: Medicare Other | Admitting: Family Medicine

## 2014-08-15 ENCOUNTER — Encounter: Payer: Self-pay | Admitting: Family Medicine

## 2014-08-15 ENCOUNTER — Other Ambulatory Visit: Payer: Self-pay

## 2014-08-15 VITALS — BP 144/78 | HR 82 | Resp 16 | Ht 69.0 in | Wt 282.0 lb

## 2014-08-15 DIAGNOSIS — N3090 Cystitis, unspecified without hematuria: Secondary | ICD-10-CM

## 2014-08-15 DIAGNOSIS — I10 Essential (primary) hypertension: Secondary | ICD-10-CM | POA: Diagnosis not present

## 2014-08-15 DIAGNOSIS — Z23 Encounter for immunization: Secondary | ICD-10-CM | POA: Insufficient documentation

## 2014-08-15 DIAGNOSIS — E782 Mixed hyperlipidemia: Secondary | ICD-10-CM | POA: Diagnosis not present

## 2014-08-15 DIAGNOSIS — R7309 Other abnormal glucose: Secondary | ICD-10-CM

## 2014-08-15 DIAGNOSIS — IMO0001 Reserved for inherently not codable concepts without codable children: Secondary | ICD-10-CM

## 2014-08-15 DIAGNOSIS — Z1211 Encounter for screening for malignant neoplasm of colon: Secondary | ICD-10-CM | POA: Diagnosis not present

## 2014-08-15 DIAGNOSIS — R7303 Prediabetes: Secondary | ICD-10-CM

## 2014-08-15 DIAGNOSIS — I2510 Atherosclerotic heart disease of native coronary artery without angina pectoris: Secondary | ICD-10-CM | POA: Diagnosis not present

## 2014-08-15 LAB — HEMOCCULT GUIAC POC 1CARD (OFFICE): FECAL OCCULT BLD: NEGATIVE

## 2014-08-15 MED ORDER — HYDROCODONE-ACETAMINOPHEN 5-325 MG PO TABS: ORAL_TABLET | ORAL | Status: DC

## 2014-08-15 NOTE — Patient Instructions (Addendum)
Annual wellness in 4.5 month, call if you need me before  TdAP today  Due to open area around navel, no "clamp " present  Labs today, CMP, HBA1C  Fasting lipid and cmp in 4.5  Month  Please use miralalx every day as prescribed, so you do not strain   Urine to be checked for infection

## 2014-08-15 NOTE — Progress Notes (Signed)
   Subjective:    Patient ID: Craig Fields, male    DOB: 07-15-1941, 73 y.o.   MRN: [id number]  HPI The PT is here for follow up and re-evaluation of chronic medical conditions, medication management and review of any available recent lab and radiology data.  Preventive health is updated, specifically  Cancer screening and Immunization.   Questions or concerns regarding consultations or procedures which the PT has had in the interim are  addressed. The PT denies any adverse reactions to current medications since the last visit.  There are no new concerns.  Wants his navel checked due to continuous drainage     Review of Systems See HPI Denies recent fever or chills. Denies sinus pressure, nasal congestion, ear pain or sore throat. Denies chest congestion, productive cough or wheezing. Denies chest pains, palpitations and leg swelling Denies abdominal pain, nausea, vomiting,c/o  Constipation and needing to strain at stool depending on diet, notes when he eats a lot of biscuits needs to strain more   C/o urinary frequency and mild dysuria, denies flank pain. C/o chronic  joint pain, swelling and limitation in mobility. Denies headaches, seizures, has  numbness, and  Tingling in extremities Denies depression, anxiety or insomnia.       Objective:   Physical Exam  BP 144/78 mmHg  Pulse 82  Resp 16  Ht 5\' 9"  (1.753 m)  Wt 282 lb (127.914 kg)  BMI 41.63 kg/m2  SpO2 96% Patient alert and oriented and in no cardiopulmonary distress.  HEENT: No facial asymmetry, EOMI,   oropharynx pink and moist.  Neck decreased though adequate ROM,  no JVD, no mass.  Chest: Clear to auscultation bilaterally.  CVS: S1, S2 no murmurs, no S3.Regular rate.  ABD: Soft non tender.  Rectal: no mass, heme negative stool Ext: No edema  MS: decreased ROM spine, hips, and knees  Skin: open lesion in umbilicus with drainage from the area, serous appearing, this is chronic.  Psych: Good eye  contact, normal affect. Memory intact not anxious or depressed appearing.  CNS: CN 2-12 intact, contracture and redyuced power in left upper ext following CVA.       Assessment & Plan:  Essential hypertension, benign UnControlled, no change in medication, DASH diet and commitment to daily physical activity f discussed and encouraged, as a part of hypertension management. The importance of attaining a healthy weight is also discussed.   Mixed hyperlipidemia Elevated TG, need to reduce fried and fatty foods Hyperlipidemia:Low fat diet discussed and encouraged.  Updated lab needed at/ before next visit.   Need for Tdap vaccination Vaccine administered at visit. Open draining area in umbilicus which is chronic   Obesity, Class II, BMI 35.0-39.9, with comorbidity (see actual BMI) Deteriorated. Patient re-educated about  the importance of commitment to a  minimum of 150 minutes of exercise per week. The importance of healthy food choices with portion control discussed. Encouraged to start a food diary, count calories and to consider  joining a support group. Sample diet sheets offered. Goals set by the patient for the next several months.     Special screening for malignant neoplasms, colon No mass, heme negative stool  Coronary atherosclerosis of native coronary artery Denies angina, though very limited mobility, denies PND orthopnea, stable at this time

## 2014-08-16 LAB — COMPREHENSIVE METABOLIC PANEL
ALBUMIN: 4 g/dL (ref 3.5–5.2)
ALK PHOS: 43 U/L (ref 39–117)
ALT: 19 U/L (ref 0–53)
AST: 23 U/L (ref 0–37)
BILIRUBIN TOTAL: 0.6 mg/dL (ref 0.2–1.2)
BUN: 20 mg/dL (ref 6–23)
CO2: 28 mEq/L (ref 19–32)
CREATININE: 0.95 mg/dL (ref 0.50–1.35)
Calcium: 9.8 mg/dL (ref 8.4–10.5)
Chloride: 101 mEq/L (ref 96–112)
GLUCOSE: 107 mg/dL — AB (ref 70–99)
Potassium: 4.3 mEq/L (ref 3.5–5.3)
Sodium: 139 mEq/L (ref 135–145)
Total Protein: 7.2 g/dL (ref 6.0–8.3)

## 2014-08-16 LAB — HEMOGLOBIN A1C
Hgb A1c MFr Bld: 6.2 % — ABNORMAL HIGH (ref <5.7)
MEAN PLASMA GLUCOSE: 131 mg/dL — AB (ref <117)

## 2014-08-17 LAB — URINE CULTURE: Colony Count: 2000

## 2014-08-30 ENCOUNTER — Other Ambulatory Visit: Payer: Self-pay

## 2014-08-30 MED ORDER — HYDROCODONE-ACETAMINOPHEN 5-325 MG PO TABS: ORAL_TABLET | ORAL | Status: DC

## 2014-09-14 DIAGNOSIS — Z125 Encounter for screening for malignant neoplasm of prostate: Secondary | ICD-10-CM | POA: Insufficient documentation

## 2014-09-14 NOTE — Assessment & Plan Note (Signed)
No mass, heme negative stool 

## 2014-09-14 NOTE — Assessment & Plan Note (Addendum)
UnControlled, no change in medication, DASH diet and commitment to daily physical activity f discussed and encouraged, as a part of hypertension management. The importance of attaining a healthy weight is also discussed.

## 2014-09-14 NOTE — Assessment & Plan Note (Addendum)
Vaccine administered at visit. Open draining area in umbilicus which is chronic

## 2014-09-14 NOTE — Assessment & Plan Note (Signed)
Deteriorated. Patient re-educated about  the importance of commitment to a  minimum of 150 minutes of exercise per week. The importance of healthy food choices with portion control discussed. Encouraged to start a food diary, count calories and to consider  joining a support group. Sample diet sheets offered. Goals set by the patient for the next several months.    

## 2014-09-14 NOTE — Assessment & Plan Note (Signed)
Denies angina, though very limited mobility, denies PND orthopnea, stable at this time

## 2014-09-14 NOTE — Assessment & Plan Note (Signed)
Elevated TG, need to reduce fried and fatty foods Hyperlipidemia:Low fat diet discussed and encouraged.  Updated lab needed at/ before next visit.

## 2014-09-16 NOTE — Addendum Note (Signed)
Addended by: Eual Fines on: 09/16/2014 08:36 AM   Modules accepted: Orders

## 2014-09-23 ENCOUNTER — Other Ambulatory Visit: Payer: Self-pay | Admitting: Family Medicine

## 2014-10-15 DIAGNOSIS — I739 Peripheral vascular disease, unspecified: Secondary | ICD-10-CM | POA: Diagnosis not present

## 2014-10-15 DIAGNOSIS — B351 Tinea unguium: Secondary | ICD-10-CM | POA: Diagnosis not present

## 2014-10-15 DIAGNOSIS — L851 Acquired keratosis [keratoderma] palmaris et plantaris: Secondary | ICD-10-CM | POA: Diagnosis not present

## 2014-10-18 ENCOUNTER — Other Ambulatory Visit: Payer: Self-pay

## 2014-10-18 MED ORDER — HYDROCODONE-ACETAMINOPHEN 5-325 MG PO TABS: ORAL_TABLET | ORAL | Status: DC

## 2014-10-21 ENCOUNTER — Other Ambulatory Visit: Payer: Self-pay | Admitting: Family Medicine

## 2014-11-19 ENCOUNTER — Other Ambulatory Visit: Payer: Self-pay

## 2014-11-19 MED ORDER — HYDROCODONE-ACETAMINOPHEN 5-325 MG PO TABS: ORAL_TABLET | ORAL | Status: DC

## 2014-12-23 ENCOUNTER — Encounter: Payer: Medicare Other | Admitting: Family Medicine

## 2014-12-24 DIAGNOSIS — L851 Acquired keratosis [keratoderma] palmaris et plantaris: Secondary | ICD-10-CM | POA: Diagnosis not present

## 2014-12-24 DIAGNOSIS — B351 Tinea unguium: Secondary | ICD-10-CM | POA: Diagnosis not present

## 2014-12-24 DIAGNOSIS — I739 Peripheral vascular disease, unspecified: Secondary | ICD-10-CM | POA: Diagnosis not present

## 2014-12-27 ENCOUNTER — Other Ambulatory Visit: Payer: Self-pay | Admitting: Family Medicine

## 2015-01-27 ENCOUNTER — Encounter: Payer: Self-pay | Admitting: Cardiology

## 2015-01-27 ENCOUNTER — Ambulatory Visit (INDEPENDENT_AMBULATORY_CARE_PROVIDER_SITE_OTHER): Payer: Medicare Other | Admitting: Cardiology

## 2015-01-27 VITALS — BP 128/76 | HR 71 | Ht 69.0 in | Wt 285.0 lb

## 2015-01-27 DIAGNOSIS — E782 Mixed hyperlipidemia: Secondary | ICD-10-CM

## 2015-01-27 DIAGNOSIS — I2510 Atherosclerotic heart disease of native coronary artery without angina pectoris: Secondary | ICD-10-CM | POA: Diagnosis not present

## 2015-01-27 DIAGNOSIS — I10 Essential (primary) hypertension: Secondary | ICD-10-CM | POA: Diagnosis not present

## 2015-01-27 NOTE — Patient Instructions (Signed)
Your physician wants you to follow-up in: 6 months with Dr.Branch You will receive a reminder letter in the mail two months in advance. If you don't receive a letter, please call our office to schedule the follow-up appointment.   Your physician recommends that you continue on your current medications as directed. Please refer to the Current Medication list given to you today.    Thank you for choosing [facility] !        

## 2015-01-27 NOTE — Progress Notes (Signed)
Cardiology Office Note  Date: 01/27/2015   ID: Zhi, Geier [birth year], MRN [id number]  PCP: Tula Nakayama, MD  Primary Cardiologist: Rozann Lesches, MD   Chief Complaint  Patient presents with  . Coronary Artery Disease  . Hyperlipidemia    History of Present Illness: Craig Fields is a 74 y.o. male last seen in November 2015. He is here with his wife today for a follow-up visit. He does not report any angina symptoms, has decreased stamina which is been fairly chronic. He continues to use a cane and also a brace on his left leg, residual from prior stroke.  We reviewed his medications, no changes from a cardiac perspective. He will be following up with Dr. Moshe Cipro later on this year for lab work. Lipid panel from last year looked good.  ECG reviewed today, outlined below.  Blood pressure is better controlled today.   Past Medical History  Diagnosis Date  . IGT (impaired glucose tolerance)   . Anxiety disorder   . Obesity   . Coronary atherosclerosis of native coronary artery     Multivessel status post CABG  . Essential hypertension, benign   . OSA (obstructive sleep apnea)   . Hyperlipidemia   . 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 Colon cancer May 2007  . Seizures     Age 1 or 3, no meds and no reoccurances  . Chronic back pain     Past Surgical History  Procedure Laterality Date  . Left inguinal  hernia repair  2005  . [name]  2007  . Coronary artery bypass graft  2004    x4  . Incisional hernia repair N/A 01/01/2013    Procedure: HERNIA REPAIR INCISIONAL;  Surgeon: Jamesetta So, MD;  Location: [facility];  Service: General;  Laterality: N/A;  umbilical area  . Insertion of mesh N/A 01/01/2013    Procedure: INSERTION OF MESH;  Surgeon: Jamesetta So, MD;  Location: [facility];  Service: General;  Laterality: N/A;    Current Outpatient Prescriptions  Medication Sig Dispense Refill  . aspirin (ASPIRIN LOW DOSE) 81 MG EC tablet  Take 81 mg by mouth daily.      . clopidogrel (PLAVIX) 75 MG tablet TAKE 1 TABLET EVERY DAY 30 tablet 4  . clotrimazole-betamethasone (LOTRISONE) cream APPLY TO AFFECTED AREA TWICE A DAY 30 g 1  . Coenzyme Q10 (CO Q-10) 200 MG CAPS Take 1 tablet by mouth daily. 400 mg    . KLOR-CON M20 20 MEQ tablet TAKE 2 TABLETS EVERY DAY 60 tablet 4  . lisinopril (PRINIVIL,ZESTRIL) 20 MG tablet TAKE 1/2 TABLET BY MOUTH DAILY. 30 tablet 4  . metoprolol (LOPRESSOR) 50 MG tablet TAKE 1/2 TABLET BY MOUTH TWICE DAILY 30 tablet 4  . Multiple Vitamins-Minerals (CVS SPECTRAVITE) TABS Take 1 tablet by mouth daily.     . Omega-3 Fatty Acids (FISH OIL) 1000 MG CAPS Take 2 capsules by mouth at bedtime. 1200 mg    . polyethylene glycol powder (GLYCOLAX/MIRALAX) powder TAKE 17 G BY MOUTH DAILY. 527 g 2  . simvastatin (ZOCOR) 20 MG tablet TAKE 1 TABLET BY MOUTH AT BEDTIME 30 tablet 5  . triamterene-hydrochlorothiazide (DYAZIDE) 37.5-25 MG per capsule TAKE ONE CAPSULE BY MOUTH EVERY MORNING 30 capsule 4   No current facility-administered medications for this visit.    Allergies:  Bee venom and Niacin   Social History: The patient  reports that he quit smoking  about 34 years ago. His smoking use included Cigarettes. He has a 2.5 pack-year smoking history. He quit smokeless tobacco use about 2 years ago. His smokeless tobacco use included Chew. He reports that he does not drink alcohol or use illicit drugs.   ROS:  Please see the history of present illness. Otherwise, complete review of systems is positive for chronic fatigue, left arm contracture and left leg weakness.  All other systems are reviewed and negative.   Physical Exam: VS:  BP 128/76 mmHg  Pulse 71  Ht 5\' 9"  (1.753 m)  Wt 285 lb (129.275 kg)  BMI 42.07 kg/m2, BMI Body mass index is 42.07 kg/(m^2).  Wt Readings from Last 3 Encounters:  01/27/15 285 lb (129.275 kg)  08/15/14 282 lb (127.914 kg)  07/30/14 278 lb (126.1 kg)     Overweight male,  chronically ill-appearing, in no acute distress.  HEENT: Conjunctiva and lids normal, oropharynx clear.  Neck: Supple, no elevated JVP or carotid bruits, no thyromegaly.  Lungs: Clear to auscultation, decreased breath sounds, nonlabored breathing at rest.  Cardiac: Regular rate and rhythm, no S3 or significant systolic murmur, no pericardial rub.  Abdomen: Soft, nontender, protuberant, healed surgical scars, bowel sounds present.  Extremities: Trace edema, distal pulses 1-2+. Brace on left leg.    ECG: ECG is ordered today and shows sinus rhythm with LVH, leftward axis.  Recent Labwork: 04/10/2014: TSH 3.938 08/15/2014: ALT 19; AST 23; BUN 20; Creatinine 0.95; Potassium 4.3; Sodium 139     Component Value Date/Time   CHOL 137 04/10/2014 0838   TRIG 153* 04/10/2014 0838   HDL 38* 04/10/2014 0838   CHOLHDL 3.6 04/10/2014 0838   VLDL 31 04/10/2014 0838   LDLCALC 68 04/10/2014 0838    Other Studies Reviewed Today:  1. Echocardiogram from July 2014 revealed moderate LVH with LVEF 69-48%, grade 1 diastolic dysfunction, mild mitral regurgitation, mild left atrial enlargement.   2. Lexiscan Myoview from January 2013 was overall low risk, basal septal and lateral thinning present which was felt to be either due to attenuation although small region of ischemia not excluded. LVEF 53%.  ASSESSMENT AND PLAN:  1. Symptomatically stable CAD status post CABG. Continue medical therapy and observation.  2. Hyperlipidemia, on Zocor. LDL 68 in July 2015. Keep follow-up with Dr. Moshe Cipro.  3. Essential hypertension, blood pressure is better controlled today. Changes made to current medications.  Current medicines were reviewed at length with the patient today.   Orders Placed This Encounter  Procedures  . EKG 12-Lead    Disposition: FU with me in 6 months.   Signed, Satira Sark, MD, [facility] 01/27/2015 1:23 PM    [facility] at [facility] [address] [place], [place] [address] Phone: [phone]; Fax: [phone]

## 2015-02-20 ENCOUNTER — Other Ambulatory Visit: Payer: Self-pay | Admitting: Family Medicine

## 2015-02-22 ENCOUNTER — Other Ambulatory Visit: Payer: Self-pay | Admitting: Family Medicine

## 2015-02-24 ENCOUNTER — Other Ambulatory Visit: Payer: Self-pay | Admitting: Family Medicine

## 2015-03-04 DIAGNOSIS — L851 Acquired keratosis [keratoderma] palmaris et plantaris: Secondary | ICD-10-CM | POA: Diagnosis not present

## 2015-03-04 DIAGNOSIS — B351 Tinea unguium: Secondary | ICD-10-CM | POA: Diagnosis not present

## 2015-03-04 DIAGNOSIS — I739 Peripheral vascular disease, unspecified: Secondary | ICD-10-CM | POA: Diagnosis not present

## 2015-03-18 DIAGNOSIS — L97521 Non-pressure chronic ulcer of other part of left foot limited to breakdown of skin: Secondary | ICD-10-CM | POA: Diagnosis not present

## 2015-03-22 ENCOUNTER — Other Ambulatory Visit: Payer: Self-pay | Admitting: Family Medicine

## 2015-03-25 ENCOUNTER — Other Ambulatory Visit: Payer: Self-pay

## 2015-03-25 MED ORDER — TRIAMTERENE-HCTZ 37.5-25 MG PO CAPS: 1.0000 | ORAL_CAPSULE | Freq: Every morning | ORAL | Status: DC

## 2015-05-13 DIAGNOSIS — L851 Acquired keratosis [keratoderma] palmaris et plantaris: Secondary | ICD-10-CM | POA: Diagnosis not present

## 2015-05-13 DIAGNOSIS — B351 Tinea unguium: Secondary | ICD-10-CM | POA: Diagnosis not present

## 2015-05-13 DIAGNOSIS — I739 Peripheral vascular disease, unspecified: Secondary | ICD-10-CM | POA: Diagnosis not present

## 2015-06-02 ENCOUNTER — Ambulatory Visit (INDEPENDENT_AMBULATORY_CARE_PROVIDER_SITE_OTHER): Payer: Medicare Other | Admitting: Family Medicine

## 2015-06-02 ENCOUNTER — Encounter: Payer: Self-pay | Admitting: Family Medicine

## 2015-06-02 ENCOUNTER — Other Ambulatory Visit: Payer: Self-pay | Admitting: Family Medicine

## 2015-06-02 VITALS — BP 140/80 | HR 76 | Resp 16 | Ht 69.0 in | Wt 287.0 lb

## 2015-06-02 DIAGNOSIS — Z125 Encounter for screening for malignant neoplasm of prostate: Secondary | ICD-10-CM | POA: Diagnosis not present

## 2015-06-02 DIAGNOSIS — E782 Mixed hyperlipidemia: Secondary | ICD-10-CM | POA: Diagnosis not present

## 2015-06-02 DIAGNOSIS — Z0000 Encounter for general adult medical examination without abnormal findings: Secondary | ICD-10-CM

## 2015-06-02 DIAGNOSIS — Z1211 Encounter for screening for malignant neoplasm of colon: Secondary | ICD-10-CM

## 2015-06-02 DIAGNOSIS — Z23 Encounter for immunization: Secondary | ICD-10-CM

## 2015-06-02 DIAGNOSIS — I10 Essential (primary) hypertension: Secondary | ICD-10-CM | POA: Diagnosis not present

## 2015-06-02 DIAGNOSIS — R7309 Other abnormal glucose: Secondary | ICD-10-CM | POA: Diagnosis not present

## 2015-06-02 DIAGNOSIS — R7303 Prediabetes: Secondary | ICD-10-CM

## 2015-06-02 NOTE — Patient Instructions (Signed)
F/u in early feb, call if you need me before   Flu vaccine today   You are referred for screening colonoscopy with Dr Arnoldo Morale  Fasting labs this week,

## 2015-06-02 NOTE — Assessment & Plan Note (Signed)
After obtaining informed consent, the vaccine is  administered by LPN.  

## 2015-06-02 NOTE — Progress Notes (Signed)
Subjective:    Patient ID: Craig Fields, male    DOB: Jan 15, 1941, 74 y.o.   MRN: [id number]  HPI Preventive Screening-Counseling & Management   Patient present here today for a Medicare annual wellness visit.   Current Problems (verified)   Medications Prior to Visit Allergies (verified)   PAST HISTORY  Family History (verified)    Social History married 67 years, 2 children and 5 grand children, former use of chewing tobacco but has been quit for years. Became disabled in 2003 due to stroke    Risk Factors  Current exercise habits:  Very minimal- just walks inside the house daily   Dietary issues discussed: heart healthy low carb low fat    Cardiac risk factors: CAD   Depression Screen  (Note: if answer to either of the following is "Yes", a more complete depression screening is indicated)   Over the past two weeks, have you felt down, depressed or hopeless? No  Over the past two weeks, have you felt little interest or pleasure in doing things? No  Have you lost interest or pleasure in daily life? No  Do you often feel hopeless? No  Do you cry easily over simple problems? No   Activities of Daily Living  In your present state of health, do you have any difficulty performing the following activities?  Driving?: doesn't drive  Managing money?: No Feeding yourself?:No Getting from bed to chair?:uses his cane to get himself up  Climbing a flight of stairs?: cannot  Preparing food and eating?:No Bathing or showering?:needs assistance from wife  Getting dressed?: sometimes needs assistance  Getting to the toilet?:No Using the toilet?:No Moving around from place to place?: uses cane, unable to use left arm  Fall Risk Assessment In the past year have you fallen or had a near fall?:once  Are you currently taking any medications that make you dizzy?:No   Hearing Difficulties: No Do you often ask people to speak up or repeat themselves?:No Do you experience  ringing or noises in your ears?:No Do you have difficulty understanding soft or whispered voices?:No  Cognitive Testing  Alert? Yes Normal Appearance?Yes  Oriented to person? Yes Place? Yes  Time? Yes  Displays appropriate judgment?Yes  Can read the correct time from a watch face? yes Are you having problems remembering things?No  Advanced Directives have been discussed with the patient? Yes, brochure given , full code   List the Names of Other Physician/Practitioners you currently use:  Domenic Polite (cardio)  Arnoldo Morale Sales promotion account executive) Caprice Beaver (foot dr)   Stanton Kidney any recent Medical Services you may have received from other than [facility] providers in the past year (date may be approximate).   Assessment:    Annual Wellness Exam   Plan:      Medicare Attestation  I have personally reviewed:  [name] medical and social history  Their use of alcohol, tobacco or illicit drugs  Their current medications and supplements  [name] functional ability including ADLs,fall risks, home safety risks, cognitive, and hearing and visual impairment  Diet and physical activities  Evidence for depression or mood disorders  [name] weight, height, BMI, and visual acuity have been recorded in the chart. I have made referrals, counseling, and provided education to the patient based on review of the above and I have provided the patient with a written personalized care plan for preventive services.      Review of Systems     Objective:   Physical Exam  BP 140/80  mmHg  Pulse 76  Resp 16  Ht 5\' 9"  (1.753 m)  Wt 287 lb (130.182 kg)  BMI 42.36 kg/m2  SpO2 96% \      Assessment & Plan:  Medicare annual wellness visit, subsequent Annual exam as documented. Counseling done  re healthy lifestyle involving commitment to 150 minutes exercise per week, heart healthy diet, and attaining healthy weight.The importance of adequate sleep also discussed. Regular seat belt use and home safety,  is also discussed. Changes in health habits are decided on by the patient with goals and time frames  set for achieving them. Immunization and cancer screening needs are specifically addressed at this visit.   Need for prophylactic vaccination and inoculation against influenza After obtaining informed consent, the vaccine is  administered by LPN.

## 2015-06-02 NOTE — Assessment & Plan Note (Signed)

## 2015-06-04 DIAGNOSIS — E782 Mixed hyperlipidemia: Secondary | ICD-10-CM | POA: Diagnosis not present

## 2015-06-04 DIAGNOSIS — Z125 Encounter for screening for malignant neoplasm of prostate: Secondary | ICD-10-CM | POA: Diagnosis not present

## 2015-06-04 DIAGNOSIS — I10 Essential (primary) hypertension: Secondary | ICD-10-CM | POA: Diagnosis not present

## 2015-06-04 LAB — LIPID PANEL
CHOLESTEROL: 157 mg/dL (ref 125–200)
HDL: 43 mg/dL (ref >=40)
LDL Cholesterol: 85 mg/dL (ref <130)
TRIGLYCERIDES: 147 mg/dL (ref <150)
Total CHOL/HDL Ratio: 3.7 Ratio (ref <=5.0)
VLDL: 29 mg/dL (ref <30)

## 2015-06-04 LAB — COMPREHENSIVE METABOLIC PANEL
ALT: 20 U/L (ref 9–46)
AST: 22 U/L (ref 10–35)
Albumin: 4.3 g/dL (ref 3.6–5.1)
Alkaline Phosphatase: 41 U/L (ref 40–115)
BILIRUBIN TOTAL: 0.6 mg/dL (ref 0.2–1.2)
BUN: 18 mg/dL (ref 7–25)
CALCIUM: 9.4 mg/dL (ref 8.6–10.3)
CO2: 29 mmol/L (ref 20–31)
Chloride: 101 mmol/L (ref 98–110)
Creat: 1.03 mg/dL (ref 0.70–1.18)
GLUCOSE: 105 mg/dL — AB (ref 65–99)
Potassium: 4.4 mmol/L (ref 3.5–5.3)
Sodium: 140 mmol/L (ref 135–146)
Total Protein: 6.9 g/dL (ref 6.1–8.1)

## 2015-06-04 LAB — CBC
HEMATOCRIT: 45.8 % (ref 39.0–52.0)
HEMOGLOBIN: 15.1 g/dL (ref 13.0–17.0)
MCH: 30.3 pg (ref 26.0–34.0)
MCHC: 33 g/dL (ref 30.0–36.0)
MCV: 91.8 fL (ref 78.0–100.0)
MPV: 9.1 fL (ref 8.6–12.4)
Platelets: 184 10*3/uL (ref 150–400)
RBC: 4.99 MIL/uL (ref 4.22–5.81)
RDW: 14.2 % (ref 11.5–15.5)
WBC: 7.4 10*3/uL (ref 4.0–10.5)

## 2015-06-05 LAB — PSA, MEDICARE: PSA: 3.3 ng/mL (ref <=4.00)

## 2015-06-06 LAB — HEMOGLOBIN A1C
HEMOGLOBIN A1C: 6.4 % — AB (ref <5.7)
Mean Plasma Glucose: 137 mg/dL — ABNORMAL HIGH (ref <117)

## 2015-06-10 NOTE — Addendum Note (Signed)
Addended by: Eual Fines on: 06/10/2015 04:26 PM   Modules accepted: Orders

## 2015-06-27 DIAGNOSIS — L600 Ingrowing nail: Secondary | ICD-10-CM | POA: Diagnosis not present

## 2015-06-27 DIAGNOSIS — M79675 Pain in left toe(s): Secondary | ICD-10-CM | POA: Diagnosis not present

## 2015-07-01 ENCOUNTER — Other Ambulatory Visit: Payer: Self-pay | Admitting: Family Medicine

## 2015-07-03 DIAGNOSIS — Z8500 Personal history of malignant neoplasm of unspecified digestive organ: Secondary | ICD-10-CM | POA: Diagnosis not present

## 2015-07-04 NOTE — H&P (Signed)
  NTS SOAP Note  Vital Signs:  Vitals as of: 20/35/5974: Systolic 163: Diastolic 87: Heart Rate 74: Temp 98.40F: Height 41ft 10in: Weight 280Lbs 0 Ounces: BMI 40.18  BMI : 40.18 kg/m2  Subjective: This 74 year old male presents for of need for follow up TCS for colon cancer.  Had partial colectomy in past, needs follow up colonoscopy for colon cancer survellience.  Denies any lower gi problems currently.  No family h/o colon cancer.  Review of Symptoms:  Constitutional:unremarkable   Head:unremarkable Eyes:unremarkable   Nose/Mouth/Throat:unremarkable Cardiovascular:  unremarkable Respiratory:dyspnea Gastrointestinal:  unremarkable   Genitourinary:unremarkable   partial left sided paralysis Skin:unremarkable Hematolgic/Lymphatic:unremarkable   Allergic/Immunologic:unremarkable   Past Medical History:  Reviewed  Past Medical History  Surgical History: partial colectomy, CABG, inguinal herniorrhaphy, Medical Problems: CAD,CVA, HTN, colon cancer Allergies: nkda Medications: klorcon, triamterene, simvastatin, clopidogrel, lisinopril, metoprolol, hydrocodone, baby asa, fish oil   Social History:Reviewed  Social History  Preferred Language: English Race:  White Ethnicity: Not Hispanic / Latino Age: 59 Years 7 Months Marital Status:  M Alcohol:  No Recreational drug(s):  No   Smoking Status: Never smoker reviewed on 07/03/2015 Functional Status reviewed on 07/03/2015 ------------------------------------------------ Bathing: Normal Cooking: Normal Dressing: Normal Driving: Normal Eating: Normal Managing Meds: Normal Oral Care: Normal Shopping: Normal Toileting: Normal Transferring: Normal Walking: Normal Cognitive Status reviewed on 07/03/2015 ------------------------------------------------ Attention: Normal Decision Making: Normal Language: Normal Memory: Normal Motor: Normal Perception: Normal Problem Solving: Normal Visual and Spatial:  Normal   Family History:Reviewed  Family Health History Family History is Unknown    Objective Information: walks with cane Heart:RRR, no murmur Lungs:  CTA bilaterally, no wheezes, rhonchi, rales.  Breathing unlabored. Abdomen:Soft, NT/ND, no HSM, no masses. deferred to procedure  Assessment:h/o colon cancer  Diagnoses: V10.00  Z85.00 History of malignant neoplasm of gastrointestinal tract (Personal history of malignant neoplasm of unspecified digestive organ)  Procedures: 99213 - OFFICE OUTPATIENT VISIT 15 MINUTES    Plan:  Scheduled for TCS on 07/16/15.  To stop plavix five days before procedure   Patient Education:Alternative treatments to surgery were discussed with patient (and family).  Risks and benefits  of procedure were fully explained to the patient (and family) who gave informed consent. Patient/family questions were addressed.  Follow-up:Pending Surgery

## 2015-07-15 ENCOUNTER — Ambulatory Visit (HOSPITAL_COMMUNITY)
Admission: RE | Admit: 2015-07-15 | Discharge: 2015-07-15 | Disposition: A | Discharge status: Home / Self Care | Payer: Medicare Other | Source: Ambulatory Visit | Attending: General Surgery | Admitting: General Surgery

## 2015-07-15 ENCOUNTER — Encounter (HOSPITAL_COMMUNITY)
Admission: RE | Disposition: A | Discharge status: Home / Self Care | Payer: Self-pay | Source: Ambulatory Visit | Attending: General Surgery

## 2015-07-15 ENCOUNTER — Encounter (HOSPITAL_COMMUNITY): Payer: Self-pay

## 2015-07-15 DIAGNOSIS — Z85038 Personal history of other malignant neoplasm of large intestine: Secondary | ICD-10-CM | POA: Insufficient documentation

## 2015-07-15 DIAGNOSIS — Z1211 Encounter for screening for malignant neoplasm of colon: Secondary | ICD-10-CM | POA: Diagnosis not present

## 2015-07-15 DIAGNOSIS — D123 Benign neoplasm of transverse colon: Secondary | ICD-10-CM | POA: Diagnosis not present

## 2015-07-15 DIAGNOSIS — Z79899 Other long term (current) drug therapy: Secondary | ICD-10-CM | POA: Insufficient documentation

## 2015-07-15 DIAGNOSIS — I10 Essential (primary) hypertension: Secondary | ICD-10-CM | POA: Diagnosis not present

## 2015-07-15 DIAGNOSIS — I2510 Atherosclerotic heart disease of native coronary artery without angina pectoris: Secondary | ICD-10-CM | POA: Diagnosis not present

## 2015-07-15 DIAGNOSIS — Z951 Presence of aortocoronary bypass graft: Secondary | ICD-10-CM | POA: Diagnosis not present

## 2015-07-15 DIAGNOSIS — Z7982 Long term (current) use of aspirin: Secondary | ICD-10-CM | POA: Insufficient documentation

## 2015-07-15 DIAGNOSIS — K635 Polyp of colon: Secondary | ICD-10-CM | POA: Diagnosis not present

## 2015-07-15 DIAGNOSIS — Z9049 Acquired absence of other specified parts of digestive tract: Secondary | ICD-10-CM | POA: Insufficient documentation

## 2015-07-15 HISTORY — PX: COLONOSCOPY: SHX5424

## 2015-07-15 SURGERY — COLONOSCOPY
Anesthesia: Moderate Sedation

## 2015-07-15 MED ORDER — MIDAZOLAM HCL 5 MG/5ML IJ SOLN
INTRAMUSCULAR | Status: AC
  Filled 2015-07-15: qty 5

## 2015-07-15 MED ORDER — SODIUM CHLORIDE 0.9 % IV SOLN
INTRAVENOUS | Status: DC
Start: 2015-07-15 — End: 2015-07-15
  Administered 2015-07-15: 08:00:00 via INTRAVENOUS

## 2015-07-15 MED ORDER — STERILE WATER FOR IRRIGATION IR SOLN
Status: DC | PRN
  Administered 2015-07-15: 08:00:00

## 2015-07-15 MED ORDER — MEPERIDINE HCL 50 MG/ML IJ SOLN
INTRAMUSCULAR | Status: DC | PRN
  Administered 2015-07-15: 50 mg via INTRAVENOUS

## 2015-07-15 MED ORDER — MIDAZOLAM HCL 5 MG/5ML IJ SOLN
INTRAMUSCULAR | Status: DC | PRN
  Administered 2015-07-15: 2 mg via INTRAVENOUS

## 2015-07-15 MED ORDER — MEPERIDINE HCL 50 MG/ML IJ SOLN
INTRAMUSCULAR | Status: AC
  Filled 2015-07-15: qty 1

## 2015-07-15 NOTE — Op Note (Signed)
[place], [id number]   COLONOSCOPY PROCEDURE REPORT     EXAM DATE: Jul 21, 2015  PATIENT NAME:      Craig Fields, Craig Fields           MR #:      [id number]  BIRTHDATE:       1940/12/22      VISIT #:     [id number] ATTENDING:     Aviva Signs MD     STATUS:     outpatient ASSISTANT:  INDICATIONS:  The patient is a 74 yr old male here for a colonoscopy due to high risk patient with personal history of colon cancer.  PROCEDURE PERFORMED:     Colonoscopy with snare polypectomy MEDICATIONS:     Demerol 50 mg IV and Versed 2 mg IV ESTIMATED BLOOD LOSS:     None  CONSENT: The patient understands the risks and benefits of the procedure and understands that these risks include, but are not limited to: sedation, allergic reaction, infection, perforation and/or bleeding. Alternative means of evaluation and treatment include, among others: physical exam, x-rays, and/or surgical intervention. The patient elects to proceed with this endoscopic procedure.  DESCRIPTION OF PROCEDURE: During intra-op preparation period all mechanical & medical equipment was checked for proper function. Hand hygiene and appropriate measures for infection prevention was taken. After the risks, benefits and alternatives of the procedure were thoroughly explained, Informed consent was verified, confirmed and timeout was successfully executed by the treatment team. A digital exam revealed no abnormalities of the rectum. The EC-[id number] ([id number]) endoscope was introduced through the anus and advanced to the cecum, which was identified by both the appendix and ileocecal valve. adequate (Trilyte was used) The instrument was then slowly withdrawn as the colon was fully examined.Estimated blood loss is zero unless otherwise noted in this procedure report.   COLON FINDINGS: A sessile polyp measuring 2 mm in size with a friable surface was found in the transverse colon.  A polypectomy was  performed using snare cautery.  The resection was complete, the polyp tissue was completely retrieved and sent to histology. Colorectal anastomosis widely patent.   The examination was otherwise normal. Retroflexed views revealed no abnormalities. The scope was then completely withdrawn from the patient and the procedure terminated. SCOPE WITHDRAWAL TIME: 10    ADVERSE EVENTS:      There were no immediate complications.  IMPRESSIONS:     1.  Sessile polyp was found in the transverse colon; polypectomy was performed using snare cautery 2.  Colorectal anastomosis widely patent 3.  The examination was otherwise normal  RECOMMENDATIONS:     1.  Await pathology results 2.  Repeat Colonoscopy in 5 years. RECALL:  _____________________________ Aviva Signs MD eSigned:  Aviva Signs MD 2015-07-21 8:33 AM   cc:   CPT CODES: ICD CODES:  The ICD and CPT codes recommended by this software are interpretations from the data that the clinical staff has captured with the software.  The verification of the translation of this report to the ICD and CPT codes and modifiers is the sole responsibility of the health care institution and practicing physician where this report was generated.  [place]. will not be held responsible for the validity of the ICD and CPT codes included on this report.  AMA assumes no liability for data contained or not contained herein. CPT is a Designer, television/film set of the [employer].   PATIENT NAME:  Craig Fields, Craig Fields MR#:  [id number]          

## 2015-07-15 NOTE — Discharge Instructions (Signed)
Colonoscopy, Care After °Refer to this sheet in the next few weeks. These instructions provide you with information on caring for yourself after your procedure. Your health care provider may also give you more specific instructions. Your treatment has been planned according to current medical practices, but problems sometimes occur. Call your health care provider if you have any problems or questions after your procedure. °WHAT TO EXPECT AFTER THE PROCEDURE  °After your procedure, it is typical to have the following: °· A small amount of blood in your stool. °· Moderate amounts of gas and mild abdominal cramping or bloating. °HOME CARE INSTRUCTIONS °· Do not drive, operate machinery, or sign important documents for 24 hours. °· You may shower and resume your regular physical activities, but move at a slower pace for the first 24 hours. °· Take frequent rest periods for the first 24 hours. °· Walk around or put a warm pack on your abdomen to help reduce abdominal cramping and bloating. °· Drink enough fluids to keep your urine clear or pale yellow. °· You may resume your normal diet as instructed by your health care provider. Avoid heavy or fried foods that are hard to digest. °· Avoid drinking alcohol for 24 hours or as instructed by your health care provider. °· Only take over-the-counter or prescription medicines as directed by your health care provider. °· If a tissue sample (biopsy) was taken during your procedure: °¨ Do not take aspirin or blood thinners for 7 days, or as instructed by your health care provider. °¨ Do not drink alcohol for 7 days, or as instructed by your health care provider. °¨ Eat soft foods for the first 24 hours. °SEEK MEDICAL CARE IF: °You have persistent spotting of blood in your stool 2-3 days after the procedure. °SEEK IMMEDIATE MEDICAL CARE IF: °· You have more than a small spotting of blood in your stool. °· You pass large blood clots in your stool. °· Your abdomen is swollen  (distended). °· You have nausea or vomiting. °· You have a fever. °· You have increasing abdominal pain that is not relieved with medicine. °  °This information is not intended to replace advice given to you by your health care provider. Make sure you discuss any questions you have with your health care provider. °  °Document Released: 04/13/2004 Document Revised: 06/20/2013 Document Reviewed: 05/07/2013 °Elsevier Interactive Patient Education ©2016 Elsevier Inc. ° °

## 2015-07-15 NOTE — Interval H&P Note (Signed)
History and Physical Interval Note:  07/15/2015 7:54 AM  Craig Fields  has presented today for surgery, with the diagnosis of history of colon cancer  The various methods of treatment have been discussed with the patient and family. After consideration of risks, benefits and other options for treatment, the patient has consented to  Procedure(s): COLONOSCOPY (N/A) as a surgical intervention .  [name] history has been reviewed, patient examined, no change in status, stable for surgery.  I have reviewed the patient's chart and labs.  Questions were answered to the patient's satisfaction.     Aviva Signs A

## 2015-07-15 NOTE — Interval H&P Note (Signed)
History and Physical Interval Note:  07/15/2015 7:50 AM  Craig Fields  has presented today for surgery, with the diagnosis of history of colon cancer  The various methods of treatment have been discussed with the patient and family. After consideration of risks, benefits and other options for treatment, the patient has consented to  Procedure(s): COLONOSCOPY (N/A) as a surgical intervention .  [name] history has been reviewed, patient examined, no change in status, stable for surgery.  I have reviewed the patient's chart and labs.  Questions were answered to the patient's satisfaction.     Aviva Signs A

## 2015-07-17 ENCOUNTER — Encounter (HOSPITAL_COMMUNITY): Payer: Self-pay | Admitting: General Surgery

## 2015-07-28 ENCOUNTER — Ambulatory Visit (INDEPENDENT_AMBULATORY_CARE_PROVIDER_SITE_OTHER): Payer: Medicare Other | Admitting: Cardiology

## 2015-07-28 ENCOUNTER — Encounter: Payer: Self-pay | Admitting: Cardiology

## 2015-07-28 VITALS — BP 136/84 | HR 73 | Ht 69.0 in | Wt 287.0 lb

## 2015-07-28 DIAGNOSIS — I2510 Atherosclerotic heart disease of native coronary artery without angina pectoris: Secondary | ICD-10-CM | POA: Diagnosis not present

## 2015-07-28 DIAGNOSIS — E782 Mixed hyperlipidemia: Secondary | ICD-10-CM | POA: Diagnosis not present

## 2015-07-28 DIAGNOSIS — I10 Essential (primary) hypertension: Secondary | ICD-10-CM | POA: Diagnosis not present

## 2015-07-28 NOTE — Progress Notes (Signed)
Cardiology Office Note  Date: 07/28/2015   ID: Don, [place] 1941-05-20, MRN [id number]  PCP: Tula Nakayama, MD  Primary Cardiologist: Rozann Lesches, MD   Chief Complaint  Patient presents with  . Coronary Artery Disease    History of Present Illness: Craig Fields is a 74 y.o. male last seen in May. He is here today for a routine follow-up visit. He reports no angina symptoms on medical therapy. He does not exercise, has stable left-sided residua following prior stroke, brace on the left leg, uses a cane. He does not report any obvious functional decline with basic ADLs.  Last stress test was in 2013 as outlined below.  He had recent lab work in September showing cholesterol 157, triglycerides 147, HDL 43, and LDL 85. He continues on Zocor with no obvious intolerances.   Past Medical History  Diagnosis Date  . IGT (impaired glucose tolerance)   . Anxiety disorder   . Obesity   . Coronary atherosclerosis of native coronary artery     Multivessel status post CABG  . Essential hypertension, benign   . OSA (obstructive sleep apnea)   . Hyperlipidemia   . 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 Colon cancer [facility]) May 2007  . Seizures ([place])     Age 33 or 3, no meds and no reoccurances  . Chronic back pain     Current Outpatient Prescriptions  Medication Sig Dispense Refill  . acetaminophen (TYLENOL) 500 MG tablet Take 500 mg by mouth every 6 (six) hours as needed.    Marland Kitchen aspirin (ASPIRIN LOW DOSE) 81 MG EC tablet Take 81 mg by mouth daily.      . clopidogrel (PLAVIX) 75 MG tablet TAKE 1 TABLET BY MOUTH EVERY DAY 30 tablet 4  . clotrimazole-betamethasone (LOTRISONE) cream APPLY TO AFFECTED AREA TWICE A DAY 30 g 1  . Coenzyme Q10 (CO Q-10) 200 MG CAPS Take 1 tablet by mouth daily. 400 mg    . KLOR-CON M20 20 MEQ tablet TAKE 2 TABLETS BY MOUTH EVERY DAY 60 tablet 4  . lisinopril (PRINIVIL,ZESTRIL) 20 MG tablet TAKE 1/2 TABLET BY MOUTH DAILY. 30 tablet  4  . metoprolol (LOPRESSOR) 50 MG tablet TAKE 1/2 TABLET BY MOUTH TWICE DAILY 30 tablet 4  . Multiple Vitamins-Minerals ([employer] SPECTRAVITE) TABS Take 1 tablet by mouth daily.     . Omega-3 Fatty Acids (FISH OIL) 1000 MG CAPS Take 2 capsules by mouth at bedtime. 1200 mg    . polyethylene glycol powder (GLYCOLAX/MIRALAX) powder Take 0.5 Containers by mouth daily.     . simvastatin (ZOCOR) 20 MG tablet TAKE 1 TABLET BY MOUTH AT BEDTIME 30 tablet 5  . triamterene-hydrochlorothiazide (DYAZIDE) 37.5-25 MG per capsule Take 1 each (1 capsule total) by mouth every morning. 30 capsule 4   No current facility-administered medications for this visit.    Allergies:  Bee venom and Niacin   Social History: The patient  reports that he quit smoking about 34 years ago. His smoking use included Cigarettes. He has a 2.5 pack-year smoking history. He quit smokeless tobacco use about 2 years ago. His smokeless tobacco use included Chew. He reports that he does not drink alcohol or use illicit drugs.   ROS:  Please see the history of present illness. Otherwise, complete review of systems is positive for NYHA class II dyspnea.  All other systems are reviewed and negative.   Physical Exam: VS:  BP  136/84 mmHg  Pulse 73  Ht 5\' 9"  (1.753 m)  Wt 287 lb (130.182 kg)  BMI 42.36 kg/m2  SpO2 95%, BMI Body mass index is 42.36 kg/(m^2).  Wt Readings from Last 3 Encounters:  07/28/15 287 lb (130.182 kg)  07/15/15 278 lb (126.1 kg)  06/02/15 287 lb (130.182 kg)    Overweight male, chronically ill-appearing, in no acute distress.  HEENT: Conjunctiva and lids normal, oropharynx clear.  Neck: Supple, no elevated JVP or carotid bruits, no thyromegaly.  Lungs: Clear to auscultation, decreased breath sounds, nonlabored breathing at rest.  Cardiac: Regular rate and rhythm, no S3 or significant systolic murmur, no pericardial rub.  Abdomen: Soft, nontender, protuberant, bowel sounds present.  Extremities: Trace edema,  distal pulses 1-2+. Brace on left leg. Left arm contracture.   ECG: Tracing from 01/28/2015 showed normal sinus rhythm with LVH and leftward axis.  Other Studies Reviewed Today:  1. Echocardiogram from July 2014 revealed moderate LVH with LVEF 0000000, grade 1 diastolic dysfunction, mild mitral regurgitation, mild left atrial enlargement.   2. Lexiscan Myoview from January 2013 was overall low risk, basal septal and lateral thinning present which was felt to be either due to attenuation although small region of ischemia not excluded. LVEF 53%.  ASSESSMENT AND PLAN:  1. Multivessel CAD status post CABG in 2004, overall low risk Myoview documented in 2013. Plan to continue medical therapy and observation.  2. Hyperlipidemia, recent LDL 85 on Zocor.  3. Essential hypertension, adequate blood pressure noted today. No changes were made.  Current medicines were reviewed at length with the patient today.  Disposition: FU with me in 6 months.   Signed, Satira Sark, MD, [facility] 07/28/2015 2:32 PM    [facility] at [facility] [address], [place], [place] [address] Phone: [phone]; Fax: [phone]

## 2015-07-28 NOTE — Patient Instructions (Signed)
Your physician wants you to follow-up in: 6 months with Dr McDowell You will receive a reminder letter in the mail two months in advance. If you don't receive a letter, please call our office to schedule the follow-up appointment.     Your physician recommends that you continue on your current medications as directed. Please refer to the Current Medication list given to you today.    If you need a refill on your cardiac medications before your next appointment, please call your pharmacy.     Thank you for choosing [facility] !        

## 2015-07-29 DIAGNOSIS — B351 Tinea unguium: Secondary | ICD-10-CM | POA: Diagnosis not present

## 2015-07-29 DIAGNOSIS — L851 Acquired keratosis [keratoderma] palmaris et plantaris: Secondary | ICD-10-CM | POA: Diagnosis not present

## 2015-07-29 DIAGNOSIS — I739 Peripheral vascular disease, unspecified: Secondary | ICD-10-CM | POA: Diagnosis not present

## 2015-07-31 ENCOUNTER — Other Ambulatory Visit: Payer: Self-pay | Admitting: Family Medicine

## 2015-09-09 ENCOUNTER — Other Ambulatory Visit: Payer: Self-pay

## 2015-10-07 DIAGNOSIS — L851 Acquired keratosis [keratoderma] palmaris et plantaris: Secondary | ICD-10-CM | POA: Diagnosis not present

## 2015-10-07 DIAGNOSIS — I739 Peripheral vascular disease, unspecified: Secondary | ICD-10-CM | POA: Diagnosis not present

## 2015-10-07 DIAGNOSIS — B351 Tinea unguium: Secondary | ICD-10-CM | POA: Diagnosis not present

## 2015-11-03 ENCOUNTER — Ambulatory Visit: Payer: Medicare Other | Admitting: Family Medicine

## 2015-11-19 ENCOUNTER — Other Ambulatory Visit: Payer: Self-pay | Admitting: Family Medicine

## 2015-11-19 DIAGNOSIS — R7309 Other abnormal glucose: Secondary | ICD-10-CM | POA: Diagnosis not present

## 2015-11-19 DIAGNOSIS — E782 Mixed hyperlipidemia: Secondary | ICD-10-CM | POA: Diagnosis not present

## 2015-11-20 LAB — HEPATIC FUNCTION PANEL
ALK PHOS: 38 U/L — AB (ref 40–115)
ALT: 20 U/L (ref 9–46)
AST: 22 U/L (ref 10–35)
Albumin: 4.1 g/dL (ref 3.6–5.1)
BILIRUBIN DIRECT: 0.1 mg/dL (ref <=0.2)
BILIRUBIN INDIRECT: 0.4 mg/dL (ref 0.2–1.2)
BILIRUBIN TOTAL: 0.5 mg/dL (ref 0.2–1.2)
Total Protein: 6.9 g/dL (ref 6.1–8.1)

## 2015-11-20 LAB — HEMOGLOBIN A1C
HEMOGLOBIN A1C: 6.4 % — AB (ref <5.7)
Mean Plasma Glucose: 137 mg/dL — ABNORMAL HIGH (ref <117)

## 2015-11-20 LAB — BASIC METABOLIC PANEL
BUN: 20 mg/dL (ref 7–25)
CALCIUM: 9.5 mg/dL (ref 8.6–10.3)
CO2: 28 mmol/L (ref 20–31)
CREATININE: 0.99 mg/dL (ref 0.70–1.18)
Chloride: 103 mmol/L (ref 98–110)
GLUCOSE: 109 mg/dL — AB (ref 65–99)
Potassium: 4.2 mmol/L (ref 3.5–5.3)
Sodium: 138 mmol/L (ref 135–146)

## 2015-11-20 LAB — LIPID PANEL
CHOLESTEROL: 148 mg/dL (ref 125–200)
HDL: 45 mg/dL (ref >=40)
LDL Cholesterol: 77 mg/dL (ref <130)
Total CHOL/HDL Ratio: 3.3 Ratio (ref <=5.0)
Triglycerides: 132 mg/dL (ref <150)
VLDL: 26 mg/dL (ref <30)

## 2015-11-20 LAB — TSH: TSH: 2.79 m[IU]/L (ref 0.40–4.50)

## 2015-11-21 ENCOUNTER — Encounter: Payer: Self-pay | Admitting: *Deleted

## 2015-11-28 ENCOUNTER — Encounter: Payer: Self-pay | Admitting: Family Medicine

## 2015-11-28 ENCOUNTER — Ambulatory Visit (INDEPENDENT_AMBULATORY_CARE_PROVIDER_SITE_OTHER): Payer: Medicare Other | Admitting: Family Medicine

## 2015-11-28 VITALS — BP 144/84 | HR 73 | Resp 16 | Ht 69.0 in | Wt 289.0 lb

## 2015-11-28 DIAGNOSIS — I69959 Hemiplegia and hemiparesis following unspecified cerebrovascular disease affecting unspecified side: Secondary | ICD-10-CM

## 2015-11-28 DIAGNOSIS — IMO0001 Reserved for inherently not codable concepts without codable children: Secondary | ICD-10-CM

## 2015-11-28 DIAGNOSIS — R7303 Prediabetes: Secondary | ICD-10-CM

## 2015-11-28 DIAGNOSIS — I10 Essential (primary) hypertension: Secondary | ICD-10-CM | POA: Diagnosis not present

## 2015-11-28 DIAGNOSIS — E782 Mixed hyperlipidemia: Secondary | ICD-10-CM

## 2015-11-28 NOTE — Patient Instructions (Addendum)
Annual wellness Sept 27 or after , call if you need me before  Improved and excellent labs, pls just work on reducing bad cholesterol a little bit more  Keep as active as you can in a safe way  Call for referral too physical therapy if you decide to go, I  DO believe that this will help you tremendously  All fasting labs for Sept 19 or after, including PSA, fasting lipid, cmp and EGFr, hBA1C  Need to  Collect your NEW SHOES, will help you with walking  Thanks for choosing [facility], we consider it a privelige to serve you.

## 2015-11-28 NOTE — Progress Notes (Signed)
Subjective:    Patient ID: Craig Fields, male    DOB: September 05, 1941, 75 y.o.   MRN: [id number]  HPI   Tyga Kach Buerkle     MRN: [id number]      DOB: 30-May-1941   HPI Mr. Escorcia is here for follow up and re-evaluation of chronic medical conditions, medication management and review of any available recent lab and radiology data.  Preventive health is updated, specifically  Cancer screening and Immunization.    The PT denies any adverse reactions to current medications since the last visit.  Increased difficulty with mobility and increased urinary incontinence , both of which are disabling but does not want to do anything different now  ROS Denies recent fever or chills. Denies sinus pressure, nasal congestion, ear pain or sore throat. Denies chest congestion, productive cough or wheezing. Denies chest pains, palpitations and leg swelling Denies abdominal pain, nausea, vomiting,diarrhea or constipation.   Denies dysuria, frequency, hesitancy or incontinence.  Denies headaches, seizures, numbness, or tingling. Denies depression, anxiety or insomnia. Denies skin break down or rash.   PE  BP 144/84 mmHg  Pulse 73  Resp 16  Ht 5\' 9"  (1.753 m)  Wt 289 lb (131.09 kg)  BMI 42.66 kg/m2  SpO2 96%  Patient alert and oriented and in no cardiopulmonary distress.  HEENT: No facial asymmetry, EOMI,   oropharynx pink and moist.  Neck decreased ROM no JVD, no mass.  Chest: Clear to auscultation bilaterally.  CVS: S1, S2 no murmurs, no S3.Regular rate.  ABD: Soft non tender.   Ext: No edema  [id number]  ROM spine, shoulders, hips and knees.  Skin: Intact, no ulcerations or rash noted.  Psych: Good eye contact, normal affect. Memory intact not anxious or depressed appearing.  CNS: CN 2-12 intact, left hemiparesis unchanged, grade 3 power in left upper and lower ext, increased tone   Assessment & Plan Essential hypertension, benign Elevated SBP, no med change DASH diet and  commitment to daily physical activity for a minimum of 30 minutes discussed and encouraged, as a part of hypertension management. The importance of attaining a healthy weight is also discussed.  BP/Weight 11/28/2015 07/28/2015 07/15/2015 06/02/2015 01/27/2015 08/15/2014 [id number]  Systolic BP 123456 XX123456 Q000111Q XX123456 0000000 123456 0000000  Diastolic BP 84 84 68 80 76 78 98  Wt. (Lbs) 289 287 278 287 285 282 278  BMI 42.66 42.36 42.28 42.36 42.07 41.63 41.03        Prediabetes Patient educated about the importance of limiting  Carbohydrate intake , the need to commit to daily physical activity for a minimum of 30 minutes , and to commit weight loss. The fact that changes in all these areas will reduce or eliminate all together the development of diabetes is stressed.  Unchnaged  Diabetic Labs Latest Ref Rng 11/19/2015 06/02/2015 08/15/2014 04/10/2014 11/26/2013  HbA1c <5.7 % 6.4(H) 6.4(H) 6.2(H) 6.0(H) -  Chol 125 - 200 mg/dL 148 157 - 137 -  HDL >=40 mg/dL 45 43 - 38(L) -  Calc LDL <130 mg/dL 77 85 - 68 -  Triglycerides <150 mg/dL 132 147 - 153(H) -  Creatinine 0.70 - 1.18 mg/dL 0.99 1.03 0.95 - 0.96   BP/Weight 11/28/2015 07/28/2015 07/15/2015 06/02/2015 01/27/2015 08/15/2014 [id number]  Systolic BP 123456 XX123456 Q000111Q XX123456 0000000 123456 0000000  Diastolic BP 84 84 68 80 76 78 98  Wt. (Lbs) 289 287 278 287 285 282 278  BMI 42.66 42.36 42.28 42.36 42.07 41.63 41.03   No  flowsheet data found.     Obesity, Class II, BMI 35.0-39.9, with comorbidity (see actual BMI) Deteriorated. Patient re-educated about  the importance of commitment to a  minimum of 150 minutes of exercise per week.  The importance of healthy food choices with portion control discussed. Encouraged to start a food diary, count calories and to consider  joining a support group. Sample diet sheets offered. Goals set by the patient for the next several months.   Weight /BMI 11/28/2015 07/28/2015 07/15/2015  WEIGHT 289 lb 287 lb 278 lb  HEIGHT 5\' 9"  5\' 9"  5\' 8"     BMI 42.66 kg/m2 42.36 kg/m2 42.28 kg/m2    Current exercise per week 40 minutes.   Hemiplegia, late effect of cerebrovascular disease (HCC) Decreased power, increased tone, reduced ability to safely ambulate. Needs Pt, however, hesitant to commit, states "they made it worse" Will attempt to improve is mobility on his own at this time  Mixed hyperlipidemia Hyperlipidemia:Low fat diet discussed and encouraged.   Lipid Panel  Lab Results  Component Value Date   CHOL 148 11/19/2015   HDL 45 11/19/2015   LDLCALC 77 11/19/2015   TRIG 132 11/19/2015   CHOLHDL 3.3 11/19/2015    Controlled, no change in medication          Review of Systems     Objective:   Physical Exam        Assessment & Plan:

## 2015-12-07 NOTE — Assessment & Plan Note (Signed)
Decreased power, increased tone, reduced ability to safely ambulate. Needs Pt, however, hesitant to commit, states "they made it worse" Will attempt to improve is mobility on his own at this time

## 2015-12-07 NOTE — Assessment & Plan Note (Signed)
Elevated SBP, no med change DASH diet and commitment to daily physical activity for a minimum of 30 minutes discussed and encouraged, as a part of hypertension management. The importance of attaining a healthy weight is also discussed.  BP/Weight 11/28/2015 07/28/2015 07/15/2015 06/02/2015 01/27/2015 08/15/2014 [id number]  Systolic BP 123456 XX123456 Q000111Q XX123456 0000000 123456 0000000  Diastolic BP 84 84 68 80 76 78 98  Wt. (Lbs) 289 287 278 287 285 282 278  BMI 42.66 42.36 42.28 42.36 42.07 41.63 41.03

## 2015-12-07 NOTE — Assessment & Plan Note (Signed)
Hyperlipidemia:Low fat diet discussed and encouraged.   Lipid Panel  Lab Results  Component Value Date   CHOL 148 11/19/2015   HDL 45 11/19/2015   LDLCALC 77 11/19/2015   TRIG 132 11/19/2015   CHOLHDL 3.3 11/19/2015    Controlled, no change in medication

## 2015-12-07 NOTE — Assessment & Plan Note (Signed)
Patient educated about the importance of limiting  Carbohydrate intake , the need to commit to daily physical activity for a minimum of 30 minutes , and to commit weight loss. The fact that changes in all these areas will reduce or eliminate all together the development of diabetes is stressed.  Unchnaged  Diabetic Labs Latest Ref Rng 11/19/2015 06/02/2015 08/15/2014 04/10/2014 11/26/2013  HbA1c <5.7 % 6.4(H) 6.4(H) 6.2(H) 6.0(H) -  Chol 125 - 200 mg/dL 148 157 - 137 -  HDL >=40 mg/dL 45 43 - 38(L) -  Calc LDL <130 mg/dL 77 85 - 68 -  Triglycerides <150 mg/dL 132 147 - 153(H) -  Creatinine 0.70 - 1.18 mg/dL 0.99 1.03 0.95 - 0.96   BP/Weight 11/28/2015 07/28/2015 07/15/2015 06/02/2015 01/27/2015 08/15/2014 [id number]  Systolic BP 123456 XX123456 Q000111Q XX123456 0000000 123456 0000000  Diastolic BP 84 84 68 80 76 78 98  Wt. (Lbs) 289 287 278 287 285 282 278  BMI 42.66 42.36 42.28 42.36 42.07 41.63 41.03   No flowsheet data found.

## 2015-12-07 NOTE — Assessment & Plan Note (Signed)
Deteriorated. Patient re-educated about  the importance of commitment to a  minimum of 150 minutes of exercise per week.  The importance of healthy food choices with portion control discussed. Encouraged to start a food diary, count calories and to consider  joining a support group. Sample diet sheets offered. Goals set by the patient for the next several months.   Weight /BMI 11/28/2015 07/28/2015 07/15/2015  WEIGHT 289 lb 287 lb 278 lb  HEIGHT 5\' 9"  5\' 9"  5\' 8"   BMI 42.66 kg/m2 42.36 kg/m2 42.28 kg/m2    Current exercise per week 40 minutes.

## 2015-12-16 DIAGNOSIS — B351 Tinea unguium: Secondary | ICD-10-CM | POA: Diagnosis not present

## 2015-12-16 DIAGNOSIS — L851 Acquired keratosis [keratoderma] palmaris et plantaris: Secondary | ICD-10-CM | POA: Diagnosis not present

## 2015-12-16 DIAGNOSIS — I739 Peripheral vascular disease, unspecified: Secondary | ICD-10-CM | POA: Diagnosis not present

## 2015-12-28 ENCOUNTER — Other Ambulatory Visit: Payer: Self-pay | Admitting: Family Medicine

## 2015-12-29 ENCOUNTER — Telehealth: Payer: Self-pay | Admitting: Family Medicine

## 2015-12-29 NOTE — Telephone Encounter (Signed)
Patient is asking for a returned call regarding diabetic shoes, please advise?

## 2015-12-31 NOTE — Telephone Encounter (Signed)
Prescription faxed as requested.

## 2015-12-31 NOTE — Telephone Encounter (Signed)
Spoke with patient and he is in need of order for specialty shoe.  He gets this from [facility] [employer] in [place].   His podiatrist originally sent the order but they do not accept from podiatrist.  Order needs to be for AFO brace and shoe for left foot. No diabetic diagnosis can be included.  May I have a script.

## 2015-12-31 NOTE — Telephone Encounter (Signed)
Written, please send and let him know

## 2016-01-27 ENCOUNTER — Other Ambulatory Visit: Payer: Self-pay | Admitting: Family Medicine

## 2016-02-11 ENCOUNTER — Encounter: Payer: Self-pay | Admitting: Cardiology

## 2016-02-11 ENCOUNTER — Ambulatory Visit (INDEPENDENT_AMBULATORY_CARE_PROVIDER_SITE_OTHER): Payer: Medicare Other | Admitting: Cardiology

## 2016-02-11 VITALS — BP 160/70 | HR 65 | Ht 69.0 in | Wt 283.0 lb

## 2016-02-11 DIAGNOSIS — I10 Essential (primary) hypertension: Secondary | ICD-10-CM | POA: Diagnosis not present

## 2016-02-11 DIAGNOSIS — I2510 Atherosclerotic heart disease of native coronary artery without angina pectoris: Secondary | ICD-10-CM | POA: Diagnosis not present

## 2016-02-11 DIAGNOSIS — E782 Mixed hyperlipidemia: Secondary | ICD-10-CM

## 2016-02-11 NOTE — Patient Instructions (Signed)
Your physician wants you to follow-up in: 6 months with Dr.McDowell You will receive a reminder letter in the mail two months in advance. If you don't receive a letter, please call our office to schedule the follow-up appointment.     Your physician recommends that you continue on your current medications as directed. Please refer to the Current Medication list given to you today.     Thank you for choosing [facility] !        

## 2016-02-11 NOTE — Progress Notes (Signed)
Cardiology Office Note  Date: 02/11/2016   ID: Koren, [place] 08-07-1941, MRN [id number]  PCP: Tula Nakayama, MD  Primary Cardiologist: Rozann Lesches, MD   Chief Complaint  Patient presents with  . Coronary Artery Disease    History of Present Illness: Craig Fields is a 75 y.o. male last seen in November 2016. He presents with his wife for a routine follow-up visit. Does not endorse any angina symptoms. Activities are limited based on stroke residua, he has a brace on his left leg, also uses a cane.  He continues to follow with Dr. Moshe Cipro. I reviewed his most recent lab work, LDL was 77 in March. Cardiac medications include aspirin, Plavix, lisinopril, Lopressor, and Zocor.  Last stress test was in 2013. We have discussed continuing observation for now in light of symptom stability.  Past Medical History  Diagnosis Date  . IGT (impaired glucose tolerance)   . Anxiety disorder   . Obesity   . Coronary atherosclerosis of native coronary artery     Multivessel status post CABG  . Essential hypertension, benign   . OSA (obstructive sleep apnea)   . Hyperlipidemia   . 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 Colon cancer [facility]) May 2007  . Seizures ([place])     Age 16 or 3, no meds and no reoccurances  . Chronic back pain     Past Surgical History  Procedure Laterality Date  . Left inguinal  hernia repair  2005  . [name]  2007  . Coronary artery bypass graft  2004    x4  . Incisional hernia repair N/A 01/01/2013    Procedure: HERNIA REPAIR INCISIONAL;  Surgeon: Jamesetta So, MD;  Location: [facility] ORS;  Service: General;  Laterality: N/A;  umbilical area  . Insertion of mesh N/A 01/01/2013    Procedure: INSERTION OF MESH;  Surgeon: Jamesetta So, MD;  Location: [facility] ORS;  Service: General;  Laterality: N/A;  . Colonoscopy N/A 07/15/2015    Procedure: COLONOSCOPY;  Surgeon: Aviva Signs, MD;  Location: [facility] ENDO SUITE;  Service: Gastroenterology;   Laterality: N/A;    Current Outpatient Prescriptions  Medication Sig Dispense Refill  . acetaminophen (TYLENOL) 500 MG tablet Take 500 mg by mouth every 6 (six) hours as needed.    Marland Kitchen aspirin (ASPIRIN LOW DOSE) 81 MG EC tablet Take 81 mg by mouth daily.      . clopidogrel (PLAVIX) 75 MG tablet TAKE 1 TABLET BY MOUTH EVERY DAY 30 tablet 4  . clotrimazole-betamethasone (LOTRISONE) cream APPLY TO AFFECTED AREA TWICE A DAY 30 g 1  . Coenzyme Q10 (CO Q-10) 200 MG CAPS Take 1 tablet by mouth daily. 400 mg    . KLOR-CON M20 20 MEQ tablet TAKE 2 TABLETS BY MOUTH EVERY DAY 60 tablet 4  . lisinopril (PRINIVIL,ZESTRIL) 20 MG tablet TAKE 1/2 TABLET BY MOUTH DAILY. 30 tablet 4  . metoprolol (LOPRESSOR) 50 MG tablet TAKE 1/2 TABLET BY MOUTH TWICE DAILY 30 tablet 4  . Multiple Vitamins-Minerals ([employer] SPECTRAVITE) TABS Take 1 tablet by mouth daily.     . Omega-3 Fatty Acids (FISH OIL) 1000 MG CAPS Take 2 capsules by mouth at bedtime. 1200 mg    . polyethylene glycol powder (GLYCOLAX/MIRALAX) powder Take 0.5 Containers by mouth daily.     . simvastatin (ZOCOR) 20 MG tablet TAKE 1 TABLET BY MOUTH AT BEDTIME 30 tablet 5  . triamterene-hydrochlorothiazide (DYAZIDE) 37.5-25 MG  capsule TAKE ONE CAPSULE BY MOUTH EVERY MORNING 30 capsule 4   No current facility-administered medications for this visit.   Allergies:  Bee venom and Niacin   Social History: The patient  reports that he quit smoking about 35 years ago. His smoking use included Cigarettes. He has a 2.5 pack-year smoking history. He quit smokeless tobacco use about 3 years ago. His smokeless tobacco use included Chew. He reports that he does not drink alcohol or use illicit drugs.   ROS:  Please see the history of present illness. Otherwise, complete review of systems is positive for left-sided weakness which is chronic.  All other systems are reviewed and negative.   Physical Exam: VS:  BP 160/70 mmHg  Pulse 65  Ht 5\' 9"  (1.753 m)  Wt 283 lb (128.368  kg)  BMI 41.77 kg/m2  SpO2 96%, BMI Body mass index is 41.77 kg/(m^2).  Wt Readings from Last 3 Encounters:  02/11/16 283 lb (128.368 kg)  11/28/15 289 lb (131.09 kg)  07/28/15 287 lb (130.182 kg)    Overweight male, chronically ill-appearing, in no acute distress.  HEENT: Conjunctiva and lids normal, oropharynx clear.  Neck: Supple, no elevated JVP or carotid bruits, no thyromegaly.  Lungs: Clear to auscultation, decreased breath sounds, nonlabored breathing at rest.  Cardiac: Regular rate and rhythm, no S3 or significant systolic murmur, no pericardial rub.  Abdomen: Soft, nontender, protuberant, bowel sounds present.  Extremities: Trace edema, distal pulses 1-2+. Brace on left leg. Left arm contracture.  ECG: Tracing from 01/28/2015 showed normal sinus rhythm with LVH and leftward axis.  Recent Labwork: 06/02/2015: Hemoglobin 15.1; Platelets 184 11/19/2015: ALT 20; AST 22; BUN 20; Creat 0.99; Potassium 4.2; Sodium 138; TSH 2.79     Component Value Date/Time   CHOL 148 11/19/2015 1109   TRIG 132 11/19/2015 1109   HDL 45 11/19/2015 1109   CHOLHDL 3.3 11/19/2015 1109   VLDL 26 11/19/2015 1109   LDLCALC 77 11/19/2015 1109    Other Studies Reviewed Today:  Echocardiogram July 2014: Moderate LVH with LVEF 0000000, grade 1 diastolic dysfunction, mild mitral regurgitation, mild left atrial enlargement.   Lexiscan Myoview January 2013: Low risk, basal septal and lateral thinning present which was felt to be either due to attenuation although small region of ischemia not excluded. LVEF 53%.  Assessment and Plan:  1. Symptomatically stable multivessel CAD status post CABG. Stress testing from 2013 noted above. Plan is to continue observation on medical therapy in light of symptom stability.  2. Essential hypertension, blood pressure elevated today. No changes made to current regimen, keep follow-up with Dr. Moshe Cipro.  3. Hyperlipidemia, continues on Zocor. LDL 77 in  March.  Current medicines were reviewed with the patient today.   Orders Placed This Encounter  Procedures  . EKG 12-Lead    Disposition: FU with me in 6 months.   Signed, Satira Sark, MD, [facility] 02/11/2016 9:35 AM    [facility] at [facility] [address] [place], [place] [address] Phone: [phone]; Fax: [phone]

## 2016-03-02 ENCOUNTER — Other Ambulatory Visit: Payer: Self-pay | Admitting: Family Medicine

## 2016-03-04 ENCOUNTER — Other Ambulatory Visit: Payer: Self-pay

## 2016-03-04 MED ORDER — METOPROLOL TARTRATE 50 MG PO TABS: 25.0000 mg | ORAL_TABLET | Freq: Two times a day (BID) | ORAL | Status: DC

## 2016-03-04 MED ORDER — POTASSIUM CHLORIDE CRYS ER 20 MEQ PO TBCR: 40.0000 meq | EXTENDED_RELEASE_TABLET | Freq: Every day | ORAL | Status: DC

## 2016-03-04 MED ORDER — CLOPIDOGREL BISULFATE 75 MG PO TABS: 75.0000 mg | ORAL_TABLET | Freq: Every day | ORAL | Status: DC

## 2016-03-26 DIAGNOSIS — B351 Tinea unguium: Secondary | ICD-10-CM | POA: Diagnosis not present

## 2016-03-26 DIAGNOSIS — I739 Peripheral vascular disease, unspecified: Secondary | ICD-10-CM | POA: Diagnosis not present

## 2016-03-26 DIAGNOSIS — L851 Acquired keratosis [keratoderma] palmaris et plantaris: Secondary | ICD-10-CM | POA: Diagnosis not present

## 2016-06-10 ENCOUNTER — Ambulatory Visit (INDEPENDENT_AMBULATORY_CARE_PROVIDER_SITE_OTHER): Payer: Medicare Other

## 2016-06-10 ENCOUNTER — Telehealth: Payer: Self-pay

## 2016-06-10 VITALS — BP 112/72 | HR 68 | Resp 18 | Ht 69.0 in | Wt 281.0 lb

## 2016-06-10 DIAGNOSIS — Z0000 Encounter for general adult medical examination without abnormal findings: Secondary | ICD-10-CM | POA: Diagnosis not present

## 2016-06-10 DIAGNOSIS — R7303 Prediabetes: Secondary | ICD-10-CM | POA: Diagnosis not present

## 2016-06-10 DIAGNOSIS — I10 Essential (primary) hypertension: Secondary | ICD-10-CM

## 2016-06-10 DIAGNOSIS — Z23 Encounter for immunization: Secondary | ICD-10-CM

## 2016-06-10 DIAGNOSIS — Z125 Encounter for screening for malignant neoplasm of prostate: Secondary | ICD-10-CM

## 2016-06-10 LAB — COMPREHENSIVE METABOLIC PANEL
ALBUMIN: 4.2 g/dL (ref 3.6–5.1)
ALK PHOS: 35 U/L — AB (ref 40–115)
ALT: 17 U/L (ref 9–46)
AST: 22 U/L (ref 10–35)
BUN: 22 mg/dL (ref 7–25)
CALCIUM: 9.8 mg/dL (ref 8.6–10.3)
CO2: 25 mmol/L (ref 20–31)
Chloride: 100 mmol/L (ref 98–110)
Creat: 1.02 mg/dL (ref 0.70–1.18)
Glucose, Bld: 109 mg/dL — ABNORMAL HIGH (ref 65–99)
POTASSIUM: 4.3 mmol/L (ref 3.5–5.3)
Sodium: 138 mmol/L (ref 135–146)
TOTAL PROTEIN: 7.1 g/dL (ref 6.1–8.1)
Total Bilirubin: 0.6 mg/dL (ref 0.2–1.2)

## 2016-06-10 LAB — HEMOGLOBIN A1C
Hgb A1c MFr Bld: 5.9 % — ABNORMAL HIGH (ref <5.7)
MEAN PLASMA GLUCOSE: 123 mg/dL

## 2016-06-10 NOTE — Telephone Encounter (Signed)
Labs ordered and order given to patient

## 2016-06-10 NOTE — Patient Instructions (Signed)
Thank you for choosing [facility] for your health care needs  The Annual Wellness Visit is designed to allow [place] the chance to assist you in preserving and improving you health.   Dr. Moshe Cipro will see you back in 5 months  Please have labs done today if able  If you have any concerns please don't hesitate to call.  The new # is [phone]

## 2016-06-11 ENCOUNTER — Encounter: Payer: Self-pay | Admitting: Family Medicine

## 2016-06-11 DIAGNOSIS — B351 Tinea unguium: Secondary | ICD-10-CM | POA: Diagnosis not present

## 2016-06-11 DIAGNOSIS — I739 Peripheral vascular disease, unspecified: Secondary | ICD-10-CM | POA: Diagnosis not present

## 2016-06-11 DIAGNOSIS — L851 Acquired keratosis [keratoderma] palmaris et plantaris: Secondary | ICD-10-CM | POA: Diagnosis not present

## 2016-06-14 NOTE — Progress Notes (Signed)
Subjective:    Craig Fields is a 75 y.o. male who presents for Medicare Annual/Subsequent preventive examination.   Preventive Screening-Counseling & Management  Tobacco History  Smoking Status  . Former Smoker  . Packs/day: 0.50  . Years: 5.00  . Types: Cigarettes  . Quit date: 12/27/1980  Smokeless Tobacco  . Former Systems developer  . Types: Chew  . Quit date: 01/24/2013     Current Problems (verified) Patient Active Problem List   Diagnosis Date Noted  . Special screening for malignant neoplasm of prostate 09/14/2014  . Pain in joint, shoulder region 03/27/2013  . Muscle weakness (generalized) 03/27/2013  . Left leg weakness 02/28/2013  . Difficulty in walking(719.7) 02/28/2013  . Skin lesion 12/07/2012  . Skin tag 11/16/2011  . Prediabetes 07/19/2011  . Hemiplegia, late effect of cerebrovascular disease ([place]) 11/05/2008  . Mixed hyperlipidemia 02/21/2008  . Obesity, Class II, BMI 35.0-39.9, with comorbidity (see actual BMI) 02/21/2008  . Essential hypertension, benign 02/21/2008  . Coronary atherosclerosis of native coronary artery 02/21/2008  . SLEEP APNEA 02/21/2008    Medications Prior to Visit Current Outpatient Prescriptions on File Prior to Visit  Medication Sig Dispense Refill  . acetaminophen (TYLENOL) 500 MG tablet Take 500 mg by mouth every 6 (six) hours as needed.    Marland Kitchen aspirin (ASPIRIN LOW DOSE) 81 MG EC tablet Take 81 mg by mouth daily.      . clopidogrel (PLAVIX) 75 MG tablet Take 1 tablet (75 mg total) by mouth daily. 90 tablet 1  . clotrimazole-betamethasone (LOTRISONE) cream APPLY TO AFFECTED AREA TWICE A DAY 30 g 1  . Coenzyme Q10 (CO Q-10) 200 MG CAPS Take 1 tablet by mouth daily. 400 mg    . lisinopril (PRINIVIL,ZESTRIL) 20 MG tablet TAKE 1/2 TABLET BY MOUTH DAILY. 30 tablet 4  . metoprolol (LOPRESSOR) 50 MG tablet Take 0.5 tablets (25 mg total) by mouth 2 (two) times daily. 90 tablet 1  . Multiple Vitamins-Minerals ([employer] SPECTRAVITE) TABS Take 1 tablet by  mouth daily.     . Omega-3 Fatty Acids (FISH OIL) 1000 MG CAPS Take 2 capsules by mouth at bedtime. 1200 mg    . polyethylene glycol powder (GLYCOLAX/MIRALAX) powder Take 0.5 Containers by mouth daily.     . potassium chloride SA (KLOR-CON M20) 20 MEQ tablet Take 2 tablets (40 mEq total) by mouth daily. 180 tablet 1  . simvastatin (ZOCOR) 20 MG tablet TAKE 1 TABLET BY MOUTH AT BEDTIME 30 tablet 5  . triamterene-hydrochlorothiazide (DYAZIDE) 37.5-25 MG capsule TAKE ONE CAPSULE BY MOUTH EVERY MORNING 30 capsule 4   No current facility-administered medications on file prior to visit.     Current Medications (verified) Current Outpatient Prescriptions  Medication Sig Dispense Refill  . acetaminophen (TYLENOL) 500 MG tablet Take 500 mg by mouth every 6 (six) hours as needed.    Marland Kitchen aspirin (ASPIRIN LOW DOSE) 81 MG EC tablet Take 81 mg by mouth daily.      . clopidogrel (PLAVIX) 75 MG tablet Take 1 tablet (75 mg total) by mouth daily. 90 tablet 1  . clotrimazole-betamethasone (LOTRISONE) cream APPLY TO AFFECTED AREA TWICE A DAY 30 g 1  . Coenzyme Q10 (CO Q-10) 200 MG CAPS Take 1 tablet by mouth daily. 400 mg    . lisinopril (PRINIVIL,ZESTRIL) 20 MG tablet TAKE 1/2 TABLET BY MOUTH DAILY. 30 tablet 4  . metoprolol (LOPRESSOR) 50 MG tablet Take 0.5 tablets (25 mg total) by mouth 2 (two) times daily. 90 tablet 1  .  Multiple Vitamins-Minerals ([employer]S SPECTRAVITE) TABS Take 1 tablet by mouth daily.     . Omega-3 Fatty Acids (FISH OIL) 1000 MG CAPS Take 2 capsules by mouth at bedtime. 1200 mg    . polyethylene glycol powder (GLYCOLAX/MIRALAX) powder Take 0.5 Containers by mouth daily.     . potassium chloride SA (KLOR-CON M20) 20 MEQ tablet Take 2 tablets (40 mEq total) by mouth daily. 180 tablet 1  . simvastatin (ZOCOR) 20 MG tablet TAKE 1 TABLET BY MOUTH AT BEDTIME 30 tablet 5  . triamterene-hydrochlorothiazide (DYAZIDE) 37.5-25 MG capsule TAKE ONE CAPSULE BY MOUTH EVERY MORNING 30 capsule 4   No current  facility-administered medications for this visit.      Allergies (verified) Bee venom and Niacin   PAST HISTORY  Family History Family History  Problem Relation Age of Onset  . Pancreatic cancer Mother   . Heart disease Father   . Stroke Brother   . Heart disease Brother   . Heart disease Brother   . COPD Sister   . Heart disease Sister   . Skin cancer Sister   . Prostate cancer Sister     Social History Social History  Substance Use Topics  . Smoking status: Former Smoker    Packs/day: 0.50    Years: 5.00    Types: Cigarettes    Quit date: 12/27/1980  . Smokeless tobacco: Former Systems developer    Types: Chew    Quit date: 01/24/2013  . Alcohol use No    Are there smokers in your home (other than you)?  No  Risk Factors Current exercise habits: Exercise is limited by neurologic condition(s): history of CVA.  Dietary issues discussed: Heart healthy low fat diet    Cardiac risk factors: advanced age (older than 40 for men, 101 for women), dyslipidemia, family history of premature cardiovascular disease, hypertension, male gender, obesity (BMI >= 30 kg/m2) and smoking/ tobacco exposure.  Depression Screen (Note: if answer to either of the following is "Yes", a more complete depression screening is indicated)   Q1: Over the past two weeks, have you felt down, depressed or hopeless? No  Q2: Over the past two weeks, have you felt little interest or pleasure in doing things? No  Have you lost interest or pleasure in daily life? No  Do you often feel hopeless? No  Do you cry easily over simple problems? No  Activities of Daily Living In your present state of health, do you have any difficulty performing the following activities?:  Driving? Yes due to history of cva  Managing money?  No Feeding yourself? No Getting from bed to chair? Yes due to paralysis from cva Climbing a flight of stairs? Yes Preparing food and eating?: No Bathing or showering? Yes Getting dressed:  Yes Getting to the toilet? No Using the toilet:No Moving around from place to place: Yes In the past year have you fallen or had a near fall?:No   Are you sexually active?  Yes  Do you have more than one partner?  No  Hearing Difficulties: No Do you often ask people to speak up or repeat themselves? No Do you experience ringing or noises in your ears? No Do you have difficulty understanding soft or whispered voices? No   Do you feel that you have a problem with memory? No  Do you often misplace items? No  Do you feel safe at home?  Yes  Cognitive Testing  Alert? Yes  Normal Appearance?Yes  Oriented to person? Yes  Place? Yes   Time? Yes  Recall of three objects?  Yes  Can perform simple calculations? Yes  Displays appropriate judgment?Yes  Can read the correct time from a watch face?Yes   Advanced Directives have been discussed with the patient? Yes   List the Names of Other Physician/Practitioners you currently use: 1.  Dr. Domenic Polite  (cardiology) 2.  Dr. Arnoldo Morale (gen surgeon) 3.  Dr. Gershon Crane (opthamology)  Indicate any recent Medical Services you may have received from other than [facility] providers in the past year (date may be approximate).  Immunization History  Administered Date(s) Administered  . H1N1 08/27/2008, 10/16/2008  . Influenza Split 07/06/2011, 06/07/2012  . Influenza Whole 06/14/2007, 06/24/2009, 06/30/2010  . Influenza,inj,Quad PF,36+ Mos 06/18/2013, 06/18/2014, 06/02/2015, 06/10/2016  . Pneumococcal Conjugate-13 04/10/2014  . Pneumococcal Polysaccharide-23 01/04/2007  . Td 04/15/2004  . Tdap 08/15/2014  . Zoster 01/10/2007    Screening Tests Health Maintenance  Topic Date Due  . COLONOSCOPY  07/14/2020  . TETANUS/TDAP  08/15/2024  . INFLUENZA VACCINE  Completed  . ZOSTAVAX  Completed  . PNA vac Low Risk Adult  Completed    All answers were reviewed with the patient and necessary referrals were made:  Vanetta Mulders,  LPN   [id number]   History reviewed: allergies, current medications, past family history, past medical history, past social history, past surgical history and problem list  Review of Systems A comprehensive review of systems was negative.    Objective:     Vision by Snellen chart: right eye:20/25, left eye:20/25 Blood pressure 112/72, pulse 68, resp. rate 18, height 5\' 9"  (1.753 m), weight 281 lb 0.6 oz (127.5 kg), SpO2 96 %. Body mass index is 41.5 kg/m.  No exam performed today, annual wellness without physical exam.     Assessment:      Plan:     During the course of the visit the patient was educated and counseled about appropriate screening and preventive services including:    available assistive devices  Diet review for nutrition referral? Yes ____  Not Indicated _xDr.___   Patient Instructions (the written plan) was given to the patient.  Medicare Attestation I have personally reviewed: [name] medical and social history Their use of alcohol, tobacco or illicit drugs Their current medications and supplements [name] functional ability including ADLs,fall risks, home safety risks, cognitive, and hearing and visual impairment Diet and physical activities Evidence for depression or mood disorders  [name] weight, height, BMI, and visual acuity have been recorded in the chart.  I have made referrals, counseling, and provided education to the patient based on review of the above and I have provided the patient with a written personalized care plan for preventive services.     Denman George [place], [place]   [id number]

## 2016-06-29 ENCOUNTER — Other Ambulatory Visit: Payer: Self-pay | Admitting: Family Medicine

## 2016-08-04 NOTE — Progress Notes (Signed)
Cardiology Office Note  Date: 08/09/2016   ID: Craig, Fields [birth year], MRN [id number]  PCP: Tula Nakayama, MD  Primary Cardiologist: Rozann Lesches, MD   Chief Complaint  Patient presents with  . Coronary Artery Disease    History of Present Illness: Craig Fields is a 75 y.o. male last seen in May. He is here today with his wife for a follow-up visit. He does not report any angina symptoms or nitroglycerin use. Has intermittent dyspnea on exertion without significant increase in severity or frequency.  Myoview from 2013 is outlined below. We discussed this today. No clear indication for repeat ischemic testing at this time.  I reviewed his medications. Cardiac regimen includes aspirin, Plavix, lisinopril, metoprolol, and Zocor.  Labwork from earlier this year showed LDL 77.  Past Medical History:  Diagnosis Date  . Anxiety disorder   . Chronic back pain   . Colon cancer [facility]) May 2007  . Coronary atherosclerosis of native coronary artery    Multivessel status post CABG  . 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 Essential hypertension, benign   . Hyperlipidemia   . IGT (impaired glucose tolerance)   . Obesity   . OSA (obstructive sleep apnea)   . Seizures ([place])    Age 19 or 3, no meds and no reoccurances    Past Surgical History:  Procedure Laterality Date  . COLONOSCOPY N/A 07/15/2015   Procedure: COLONOSCOPY;  Surgeon: Aviva Signs, MD;  Location: [facility];  Service: Gastroenterology;  Laterality: N/A;  . CORONARY ARTERY BYPASS GRAFT  2004   x4  . INCISIONAL HERNIA REPAIR N/A 01/01/2013   Procedure: HERNIA REPAIR INCISIONAL;  Surgeon: Jamesetta So, MD;  Location: [facility] ORS;  Service: General;  Laterality: N/A;  umbilical area  . INSERTION OF MESH N/A 01/01/2013   Procedure: INSERTION OF MESH;  Surgeon: Jamesetta So, MD;  Location: [facility] ORS;  Service: General;  Laterality: N/A;  . Left inguinal  hernia repair  2005  . [name]   2007    Current Outpatient Prescriptions  Medication Sig Dispense Refill  . acetaminophen (TYLENOL) 500 MG tablet Take 500 mg by mouth every 6 (six) hours as needed.    [name] aspirin (ASPIRIN LOW DOSE) 81 MG EC tablet Take 81 mg by mouth daily.      . clopidogrel (PLAVIX) 75 MG tablet Take 1 tablet (75 mg total) by mouth daily. 90 tablet 1  . clotrimazole-betamethasone (LOTRISONE) cream APPLY TO AFFECTED AREA TWICE A DAY 30 g 1  . Coenzyme Q10 (CO Q-10) 200 MG CAPS Take 1 tablet by mouth daily. 400 mg    . lisinopril (PRINIVIL,ZESTRIL) 20 MG tablet TAKE 1/2 TABLET BY MOUTH DAILY. 30 tablet 4  . metoprolol (LOPRESSOR) 50 MG tablet Take 0.5 tablets (25 mg total) by mouth 2 (two) times daily. 90 tablet 1  . Multiple Vitamins-Minerals (CVS SPECTRAVITE) TABS Take 1 tablet by mouth daily.     . Omega-3 Fatty Acids (FISH OIL) 1000 MG CAPS Take 2 capsules by mouth at bedtime. 1200 mg    . polyethylene glycol powder (GLYCOLAX/MIRALAX) powder Take 0.5 Containers by mouth daily.     . potassium chloride SA (KLOR-CON M20) 20 MEQ tablet Take 2 tablets (40 mEq total) by mouth daily. 180 tablet 1  . simvastatin (ZOCOR) 20 MG tablet TAKE 1 TABLET BY MOUTH AT BEDTIME 30 tablet 5  . triamterene-hydrochlorothiazide (DYAZIDE) 37.5-25 MG capsule TAKE  ONE CAPSULE BY MOUTH EVERY MORNING 30 capsule 4   No current facility-administered medications for this visit.    Allergies:  Bee venom and Niacin   Social History: The patient  reports that he quit smoking about 35 years ago. His smoking use included Cigarettes. He has a 2.50 pack-year smoking history. He quit smokeless tobacco use about 3 years ago. His smokeless tobacco use included Chew. He reports that he does not drink alcohol or use drugs.   ROS:  Please see the history of present illness. Otherwise, complete review of systems is positive for chronic orthopedic limitations with previous stroke.  All other systems are reviewed and negative.   Physical Exam: VS:   BP 126/78   Pulse 70   Ht 5\' 9"  (1.753 m)   Wt 287 lb (130.2 kg)   SpO2 97%   BMI 42.38 kg/m , BMI Body mass index is 42.38 kg/m.  Wt Readings from Last 3 Encounters:  08/09/16 287 lb (130.2 kg)  06/10/16 281 lb 0.6 oz (127.5 kg)  02/11/16 283 lb (128.4 kg)    Overweight male, in no acute distress.  HEENT: Conjunctiva and lids normal, oropharynx clear.  Neck: Supple, no elevated JVP or carotid bruits, no thyromegaly.  Lungs: Clear to auscultation, decreased breath sounds, nonlabored breathing at rest.  Cardiac: Regular rate and rhythm, no S3, 2/6 systolic murmur, no pericardial rub.  Abdomen: Soft, nontender, protuberant, bowel sounds present.  Extremities: Trace edema, distal pulses 1-2+. Brace on left leg. Left arm contracture. Skin: Warm and dry. Muscular skeletal: No kyphosis. Neuropsychiatric: Alert and oriented 3, affect appropriate.  ECG: I personally reviewed the tracing from 02/11/2016 which showed sinus rhythm with LVH and repolarization changes.  Recent Labwork: 11/19/2015: TSH 2.79 06/10/2016: ALT 17; AST 22; BUN 22; Creat 1.02; Potassium 4.3; Sodium 138     Component Value Date/Time   CHOL 148 11/19/2015 1109   TRIG 132 11/19/2015 1109   HDL 45 11/19/2015 1109   CHOLHDL 3.3 11/19/2015 1109   VLDL 26 11/19/2015 1109   LDLCALC 77 11/19/2015 1109    Other Studies Reviewed Today:  Echocardiogram 03/29/2013: Study Conclusions  - Left ventricle: The cavity size was normal. Wall thickness was increased in a pattern of moderate LVH. There was moderate concentric hypertrophy. Systolic function was normal. The estimated ejection fraction was in the range of 55% to 60%. Wall motion was normal; there were no regional wall motion abnormalities. Doppler parameters are consistent with abnormal left ventricular relaxation (grade 1 diastolic dysfunction). - Aortic valve: Valve area: 1.56cm^2(VTI). Valve area: 1.63cm^2 (Vmax). - Mitral valve:  Calcified annulus. Mild regurgitation. - Left atrium: The atrium was mildly dilated. - Atrial septum: No defect or patent foramen ovale was identified. Impressions:  - technically difficult study. Significant aortic sclerosis with moderate calcification & thickening.Minimally elevated velocities. Surveillance 2D echo-doppler studeies reccomended with concern over degree of calcification.  Lexiscan Myoview ([place]) 09/22/2011: No diagnostic ECG changes. Possible small region of basal septal/lateral ischemia with LVEF 53%.  Assessment and Plan:  1. Multivessel CAD status post CABG in 2004. He underwent follow-up ischemic testing within the last 4 years. No significant angina symptoms on medical therapy. We will continue with observation.  2. Essential hypertension, blood pressure is adequately controlled today.  3. Hyperlipidemia, continues on Zocor, last LDL 77.  4. Patient with history of sclerotic aortic valve as of 2014. Cardiac murmur is more prominent. We will obtain a follow-up echocardiogram.  Current medicines were reviewed with the patient  today.   Orders Placed This Encounter  Procedures  . ECHOCARDIOGRAM COMPLETE    Disposition: Follow-up in 6 months.  Signed, Satira Sark, MD, [facility] 08/09/2016 11:30 AM    [facility] at [facility] [address] [place], [place] [address] Phone: [phone]; Fax: [phone]

## 2016-08-09 ENCOUNTER — Encounter: Payer: Self-pay | Admitting: Cardiology

## 2016-08-09 ENCOUNTER — Ambulatory Visit (INDEPENDENT_AMBULATORY_CARE_PROVIDER_SITE_OTHER): Payer: Medicare Other | Admitting: Cardiology

## 2016-08-09 VITALS — BP 126/78 | HR 70 | Ht 69.0 in | Wt 287.0 lb

## 2016-08-09 DIAGNOSIS — R011 Cardiac murmur, unspecified: Secondary | ICD-10-CM

## 2016-08-09 DIAGNOSIS — E782 Mixed hyperlipidemia: Secondary | ICD-10-CM

## 2016-08-09 DIAGNOSIS — I2510 Atherosclerotic heart disease of native coronary artery without angina pectoris: Secondary | ICD-10-CM

## 2016-08-09 DIAGNOSIS — I10 Essential (primary) hypertension: Secondary | ICD-10-CM | POA: Diagnosis not present

## 2016-08-09 NOTE — Patient Instructions (Signed)

## 2016-08-16 ENCOUNTER — Ambulatory Visit (HOSPITAL_COMMUNITY)
Admission: RE | Admit: 2016-08-16 | Discharge: 2016-08-16 | Disposition: A | Discharge status: Home / Self Care | Payer: Medicare Other | Source: Ambulatory Visit | Attending: Cardiology | Admitting: Cardiology

## 2016-08-16 DIAGNOSIS — Z87891 Personal history of nicotine dependence: Secondary | ICD-10-CM | POA: Diagnosis not present

## 2016-08-16 DIAGNOSIS — I071 Rheumatic tricuspid insufficiency: Secondary | ICD-10-CM | POA: Diagnosis not present

## 2016-08-16 DIAGNOSIS — I350 Nonrheumatic aortic (valve) stenosis: Secondary | ICD-10-CM | POA: Diagnosis not present

## 2016-08-16 DIAGNOSIS — I340 Nonrheumatic mitral (valve) insufficiency: Secondary | ICD-10-CM | POA: Diagnosis not present

## 2016-08-16 DIAGNOSIS — I517 Cardiomegaly: Secondary | ICD-10-CM | POA: Diagnosis not present

## 2016-08-16 DIAGNOSIS — E785 Hyperlipidemia, unspecified: Secondary | ICD-10-CM | POA: Insufficient documentation

## 2016-08-16 DIAGNOSIS — R011 Cardiac murmur, unspecified: Secondary | ICD-10-CM | POA: Insufficient documentation

## 2016-08-16 DIAGNOSIS — I5189 Other ill-defined heart diseases: Secondary | ICD-10-CM | POA: Insufficient documentation

## 2016-08-16 NOTE — Progress Notes (Signed)
*  PRELIMINARY RESULTS* Echocardiogram 2D Echocardiogram has been performed.  Leavy Cella 08/16/2016, 1:58 PM

## 2016-08-17 ENCOUNTER — Telehealth: Payer: Self-pay

## 2016-08-17 DIAGNOSIS — I350 Nonrheumatic aortic (valve) stenosis: Secondary | ICD-10-CM

## 2016-08-17 NOTE — Telephone Encounter (Signed)
-----   Message from Satira Sark, MD sent at 08/16/2016  9:01 PM EST ----- Results reviewed. More prominent murmur corresponds with moderate to severe aortic stenosis - this has progressed compared to old echocardiogram. We will need to get a follow-up echocardiogram for his next visit in 6 months. A copy of this test should be forwarded to Tula Nakayama, MD.

## 2016-08-17 NOTE — Telephone Encounter (Signed)
Pt aware of results,echo order placed for 6 months before f/u visit with Dr Domenic Polite

## 2016-09-26 ENCOUNTER — Other Ambulatory Visit: Payer: Self-pay | Admitting: Family Medicine

## 2016-10-08 DIAGNOSIS — B351 Tinea unguium: Secondary | ICD-10-CM | POA: Diagnosis not present

## 2016-10-08 DIAGNOSIS — L851 Acquired keratosis [keratoderma] palmaris et plantaris: Secondary | ICD-10-CM | POA: Diagnosis not present

## 2016-10-08 DIAGNOSIS — I739 Peripheral vascular disease, unspecified: Secondary | ICD-10-CM | POA: Diagnosis not present

## 2016-10-26 ENCOUNTER — Other Ambulatory Visit: Payer: Self-pay | Admitting: Family Medicine

## 2016-11-10 ENCOUNTER — Ambulatory Visit: Payer: PRIVATE HEALTH INSURANCE | Admitting: Family Medicine

## 2016-11-19 ENCOUNTER — Emergency Department (HOSPITAL_COMMUNITY)
Admission: EM | Admit: 2016-11-19 | Discharge: 2016-11-19 | Disposition: A | Discharge status: Home / Self Care | Payer: Medicare Other | Attending: Emergency Medicine | Admitting: Emergency Medicine

## 2016-11-19 ENCOUNTER — Emergency Department (HOSPITAL_COMMUNITY): Payer: Medicare Other

## 2016-11-19 ENCOUNTER — Encounter (HOSPITAL_COMMUNITY): Payer: Self-pay | Admitting: Cardiology

## 2016-11-19 DIAGNOSIS — S92901A Unspecified fracture of right foot, initial encounter for closed fracture: Secondary | ICD-10-CM | POA: Diagnosis not present

## 2016-11-19 DIAGNOSIS — Z7982 Long term (current) use of aspirin: Secondary | ICD-10-CM | POA: Insufficient documentation

## 2016-11-19 DIAGNOSIS — S92352A Displaced fracture of fifth metatarsal bone, left foot, initial encounter for closed fracture: Secondary | ICD-10-CM | POA: Diagnosis not present

## 2016-11-19 DIAGNOSIS — R531 Weakness: Secondary | ICD-10-CM | POA: Diagnosis not present

## 2016-11-19 DIAGNOSIS — Y929 Unspecified place or not applicable: Secondary | ICD-10-CM | POA: Diagnosis not present

## 2016-11-19 DIAGNOSIS — I2510 Atherosclerotic heart disease of native coronary artery without angina pectoris: Secondary | ICD-10-CM | POA: Insufficient documentation

## 2016-11-19 DIAGNOSIS — Y999 Unspecified external cause status: Secondary | ICD-10-CM | POA: Insufficient documentation

## 2016-11-19 DIAGNOSIS — W1839XA Other fall on same level, initial encounter: Secondary | ICD-10-CM | POA: Diagnosis not present

## 2016-11-19 DIAGNOSIS — Z79899 Other long term (current) drug therapy: Secondary | ICD-10-CM | POA: Insufficient documentation

## 2016-11-19 DIAGNOSIS — S92355A Nondisplaced fracture of fifth metatarsal bone, left foot, initial encounter for closed fracture: Secondary | ICD-10-CM | POA: Insufficient documentation

## 2016-11-19 DIAGNOSIS — Z87891 Personal history of nicotine dependence: Secondary | ICD-10-CM | POA: Diagnosis not present

## 2016-11-19 DIAGNOSIS — S99921A Unspecified injury of right foot, initial encounter: Secondary | ICD-10-CM | POA: Diagnosis present

## 2016-11-19 DIAGNOSIS — Y9389 Activity, other specified: Secondary | ICD-10-CM | POA: Diagnosis not present

## 2016-11-19 DIAGNOSIS — I10 Essential (primary) hypertension: Secondary | ICD-10-CM | POA: Insufficient documentation

## 2016-11-19 DIAGNOSIS — Z7401 Bed confinement status: Secondary | ICD-10-CM | POA: Diagnosis not present

## 2016-11-19 DIAGNOSIS — R279 Unspecified lack of coordination: Secondary | ICD-10-CM | POA: Diagnosis not present

## 2016-11-19 DIAGNOSIS — R404 Transient alteration of awareness: Secondary | ICD-10-CM | POA: Diagnosis not present

## 2016-11-19 MED ORDER — OXYCODONE-ACETAMINOPHEN 5-325 MG PO TABS: 1.0000 | ORAL_TABLET | Freq: Four times a day (QID) | ORAL | 0 refills | Status: DC | PRN

## 2016-11-19 NOTE — ED Notes (Signed)
Social worker Sales promotion account executive) spoke with pt and wife.

## 2016-11-19 NOTE — ED Notes (Signed)
Pts wife spoke with Janett Billow in Case mgt. Regarding assistance at home.

## 2016-11-19 NOTE — Care Management Note (Signed)
Case Management Note  Patient Details  Name: Craig Fields MRN: [id number] Date of Birth: Jul 19, 1941  Subjective/Objective:                  CM consult received for [facility]. 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 Pt will need WC and HH RN, aid, PT. Pt's wife has chosen [facility] from list of options. Brenton Grills, [facility] rep, made aware. Pt's DME will be delivered to house. Pt's wife aware HH has 48hrs to make first visit.   Action/Plan: Plan for DC home today with [facility] services.   Expected Discharge Date:     11/19/2016             Expected Discharge Plan:  [place]  In-House Referral:  NA  Discharge planning Services  CM Consult  Post Acute Care Choice:  Home Health, Durable Medical Equipment Choice offered to:  Spouse  DME Arranged:  Wheelchair manual DME Agency:     HH Arranged:  RN, PT, Nurse's Aide [place] Agency:  [place]  Status of Service:  Completed, signed off  LATE ENTRY: CSW has discussed SNF option with pt/family, they are unable to pay privately.  Per SW convo with pt's wife they will need hospital bed to better maneuver pt. Bed ordered and [facility] updated on addition to pt's referral.     Sherald Barge, RN 11/19/2016, 12:13 PM

## 2016-11-19 NOTE — ED Notes (Signed)
Pt is requesting that pt go to rehab.  Craig Fields called, Social worker to come talk to pt and family.

## 2016-11-19 NOTE — ED Provider Notes (Signed)
[facility] DEPT Provider Note   CSN: [id number] Arrival date & time: 11/19/16  2130  By signing my name below, I, [name], attest that this documentation has been prepared under the direction and in the presence of Milton Ferguson, MD. Electronically Signed: [name], Scribe. 11/19/2016. 9:40 AM.  History   Chief Complaint Chief Complaint  Patient presents with  . Fall   The history is provided by the patient. No language interpreter was used.  Fall  This is a new problem. The current episode started 3 to 5 hours ago. The problem occurs rarely. The problem has not changed since onset.Pertinent negatives include no chest pain, no abdominal pain and no headaches. The symptoms are aggravated by walking. Nothing relieves the symptoms. He has tried nothing for the symptoms.   HPI Comments: Craig Fields is a 76 y.o. male who presents to the Emergency Department complaining of a fall that occurred this morning. He states his LE gave out (due to prior left-sided CVA) while going up a  ramp. He twisted his left ankle. He denies any LOC or head injury. He reports associated constant moderate left ankle pain that is exacerbated by movement. He denies any other complaints.     PCP: Tula Nakayama, MD  Past Medical History:  Diagnosis Date  . Anxiety disorder   . Chronic back pain   . Colon cancer [facility]) May 2007  . Coronary atherosclerosis of native coronary artery    Multivessel status post CABG  . 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 Essential hypertension, benign   . Hyperlipidemia   . IGT (impaired glucose tolerance)   . Obesity   . OSA (obstructive sleep apnea)   . Seizures ([place])    [age], no meds and no reoccurances    Patient Active Problem List   Diagnosis Date Noted  . Special screening for malignant neoplasm of prostate 09/14/2014  . Pain in joint, shoulder region 03/27/2013  . Muscle weakness (generalized) 03/27/2013  . Left leg weakness  02/28/2013  . Difficulty in walking(719.7) 02/28/2013  . Skin lesion 12/07/2012  . Skin tag 11/16/2011  . Prediabetes 07/19/2011  . 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 Mixed hyperlipidemia 02/21/2008  . Obesity, Class II, BMI 35.0-39.9, with comorbidity (see actual BMI) 02/21/2008  . Essential hypertension, benign 02/21/2008  . Coronary atherosclerosis of native coronary artery 02/21/2008  . SLEEP APNEA 02/21/2008    Past Surgical History:  Procedure Laterality Date  . COLONOSCOPY N/A 07/15/2015   Procedure: COLONOSCOPY;  Surgeon: Aviva Signs, MD;  Location: [facility] ENDO SUITE;  Service: Gastroenterology;  Laterality: N/A;  . CORONARY ARTERY BYPASS GRAFT  2004   x4  . INCISIONAL HERNIA REPAIR N/A 01/01/2013   Procedure: HERNIA REPAIR INCISIONAL;  Surgeon: Jamesetta So, MD;  Location: [facility] ORS;  Service: General;  Laterality: N/A;  umbilical area  . INSERTION OF MESH N/A 01/01/2013   Procedure: INSERTION OF MESH;  Surgeon: Jamesetta So, MD;  Location: [facility] ORS;  Service: General;  Laterality: N/A;  . Left inguinal  hernia repair  2005  . [name]  2007       Home Medications    Prior to Admission medications   Medication Sig Start Date End Date Taking? Authorizing Provider  acetaminophen (TYLENOL) 500 MG tablet Take 500 mg by mouth every 6 (six) hours as needed.    Historical Provider, MD  aspirin (ASPIRIN LOW DOSE)  81 MG EC tablet Take 81 mg by mouth daily.      Historical Provider, MD  clopidogrel (PLAVIX) 75 MG tablet TAKE 1 TABLET (75 MG TOTAL) BY MOUTH DAILY. 09/27/16   Fayrene Helper, MD  clotrimazole-betamethasone (LOTRISONE) cream APPLY TO AFFECTED AREA TWICE A DAY 12/30/15   Fayrene Helper, MD  Coenzyme Q10 (CO Q-10) 200 MG CAPS Take 1 tablet by mouth daily. 400 mg    Historical Provider, MD  KLOR-CON M20 20 MEQ tablet TAKE 2 TABLETS (40 MEQ TOTAL) BY MOUTH DAILY. 09/27/16   Fayrene Helper, MD  lisinopril (PRINIVIL,ZESTRIL)  20 MG tablet TAKE 1/2 TABLET BY MOUTH DAILY. 02/24/15   Fayrene Helper, MD  lisinopril (PRINIVIL,ZESTRIL) 20 MG tablet TAKE 1/2 TABLET BY MOUTH DAILY. 10/26/16   Fayrene Helper, MD  metoprolol (LOPRESSOR) 50 MG tablet TAKE 1/2 TABLET BY MOUTH 2 (TWO) TIMES DAILY. 09/27/16   Fayrene Helper, MD  Multiple Vitamins-Minerals ([employer] SPECTRAVITE) TABS Take 1 tablet by mouth daily.     Historical Provider, MD  Omega-3 Fatty Acids (FISH OIL) 1000 MG CAPS Take 2 capsules by mouth at bedtime. 1200 mg    Historical Provider, MD  polyethylene glycol powder (GLYCOLAX/MIRALAX) powder Take 0.5 Containers by mouth daily.  07/01/15   Historical Provider, MD  simvastatin (ZOCOR) 20 MG tablet TAKE 1 TABLET BY MOUTH AT BEDTIME 06/30/16   Fayrene Helper, MD  triamterene-hydrochlorothiazide (DYAZIDE) 37.5-25 MG capsule TAKE ONE CAPSULE BY MOUTH EVERY MORNING 06/30/16   Fayrene Helper, MD    Family History Family History  Problem Relation Age of Onset  . Pancreatic cancer Mother   . Heart disease Father   . Stroke Brother   . Heart disease Brother   . Heart disease Brother   . COPD Sister   . Heart disease Sister   . Skin cancer Sister   . Prostate cancer Sister     Social History Social History  Substance Use Topics  . Smoking status: Former Smoker    Packs/day: 0.50    Years: 5.00    Types: Cigarettes    Quit date: 12/27/1980  . Smokeless tobacco: Former Systems developer    Types: Chew    Quit date: 01/24/2013  . Alcohol use No     Allergies   Bee venom and Niacin   Review of Systems Review of Systems  Constitutional: Negative for appetite change and fatigue.  HENT: Negative for congestion, ear discharge and sinus pressure.   Eyes: Negative for discharge.  Respiratory: Negative for cough.   Cardiovascular: Negative for chest pain.  Gastrointestinal: Negative for abdominal pain and diarrhea.  Genitourinary: Negative for frequency and hematuria.  Musculoskeletal: Positive for arthralgias  (Left ankle) and myalgias (Left ankle). Negative for back pain.  Skin: Negative for rash.  Neurological: Negative for seizures, syncope and headaches.  Psychiatric/Behavioral: Negative for hallucinations.     Physical Exam Updated Vital Signs BP 166/75 (BP Location: Right Arm)   Pulse 75   Temp 98 F (36.7 C) (Oral)   Resp 20   Ht 5\' 9"  (1.753 m)   Wt 282 lb (127.9 kg)   SpO2 96%   BMI 41.64 kg/m   Physical Exam  Constitutional: He is oriented to person, place, and time. He appears well-developed.  HENT:  Head: Normocephalic.  Eyes: Conjunctivae are normal.  Neck: No tracheal deviation present.  Cardiovascular:  No murmur heard. Musculoskeletal: Normal range of motion.  Tenderness to lateral left ankle and lateral  base of the foot.   Neurological: He is oriented to person, place, and time.  Skin: Skin is warm.  Psychiatric: He has a normal mood and affect.     ED Treatments / Results  DIAGNOSTIC STUDIES: Oxygen Saturation is 96% on RA, normal by my interpretation.    COORDINATION OF CARE: 9:44 AM- Pt advised of plan for treatment and pt agrees.  Labs (all labs ordered are listed, but only abnormal results are displayed) Labs Reviewed - No data to display  EKG  EKG Interpretation None       Radiology No results found.  Procedures Procedures (including critical care time)  Medications Ordered in ED Medications - No data to display   Initial Impression / Assessment and Plan / ED Course  I have reviewed the triage vital signs and the nursing notes.  Pertinent labs & imaging results that were available during my care of the patient were reviewed by me and considered in my medical decision making (see chart for details).     Patient with fracture of his fifth metatarsal to his left foot. He is given pain medicine. He already has a special shoe for his left leg. He'll follow-up with orthopedics and try to stay off his foot  Final Clinical  Impressions(s) / ED Diagnoses   Final diagnoses:  None    New Prescriptions New Prescriptions   No medications on file   The chart was scribed for me under my direct supervision.  I personally performed the history, physical, and medical decision making and all procedures in the evaluation of this patient.Milton Ferguson, MD 11/19/16 [phone]

## 2016-11-19 NOTE — ED Triage Notes (Signed)
Fall this morning while going up wheelchair ramp. Twisted left ankle.  States he hit the back of his head.

## 2016-11-19 NOTE — Progress Notes (Signed)
    Durable Medical Equipment        Start     Ordered   11/19/16 1306  For home use only DME Hospital bed  Once    Question Answer Comment  Patient has (list medical condition): CVA   The above medical condition requires: Patient requires the ability to reposition frequently   Head must be elevated greater than: 30 degrees   Bed type Semi-electric   [employer] Yes   [place] Yes      11/19/16 1305   11/19/16 1207  For home use only DME standard manual wheelchair with seat cushion  Once    Comments:  Patient suffers from CVA which impairs their ability to perform daily activities like walker in the home.  A walker will not resolve  issue with performing activities of daily living. A wheelchair will allow patient to safely perform daily activities. Patient can safely propel the wheelchair in the home or has a caregiver who can provide assistance.  Accessories: elevating leg rests (ELRs), wheel locks, extensions and anti-tippers.   Needs to be bariatric   11/19/16 1207

## 2016-11-19 NOTE — Discharge Instructions (Signed)
Follow up with dr. Aline Brochure in 1 week.

## 2016-11-19 NOTE — ED Notes (Signed)
Patient transported to X-ray 

## 2016-11-22 DIAGNOSIS — E668 Other obesity: Secondary | ICD-10-CM | POA: Diagnosis not present

## 2016-11-22 DIAGNOSIS — F419 Anxiety disorder, unspecified: Secondary | ICD-10-CM | POA: Diagnosis not present

## 2016-11-22 DIAGNOSIS — Z6830 Body mass index (BMI) 30.0-30.9, adult: Secondary | ICD-10-CM | POA: Diagnosis not present

## 2016-11-22 DIAGNOSIS — Z7902 Long term (current) use of antithrombotics/antiplatelets: Secondary | ICD-10-CM | POA: Diagnosis not present

## 2016-11-22 DIAGNOSIS — I10 Essential (primary) hypertension: Secondary | ICD-10-CM | POA: Diagnosis not present

## 2016-11-22 DIAGNOSIS — I2510 Atherosclerotic heart disease of native coronary artery without angina pectoris: Secondary | ICD-10-CM | POA: Diagnosis not present

## 2016-11-22 DIAGNOSIS — G8929 Other chronic pain: Secondary | ICD-10-CM | POA: Diagnosis not present

## 2016-11-22 DIAGNOSIS — E669 Obesity, unspecified: Secondary | ICD-10-CM | POA: Diagnosis not present

## 2016-11-22 DIAGNOSIS — I69354 Hemiplegia and hemiparesis following cerebral infarction affecting left non-dominant side: Secondary | ICD-10-CM | POA: Diagnosis not present

## 2016-11-22 DIAGNOSIS — Z85038 Personal history of other malignant neoplasm of large intestine: Secondary | ICD-10-CM | POA: Diagnosis not present

## 2016-11-22 DIAGNOSIS — Z7982 Long term (current) use of aspirin: Secondary | ICD-10-CM | POA: Diagnosis not present

## 2016-11-22 DIAGNOSIS — Z951 Presence of aortocoronary bypass graft: Secondary | ICD-10-CM | POA: Diagnosis not present

## 2016-11-22 DIAGNOSIS — M549 Dorsalgia, unspecified: Secondary | ICD-10-CM | POA: Diagnosis not present

## 2016-11-22 DIAGNOSIS — G4733 Obstructive sleep apnea (adult) (pediatric): Secondary | ICD-10-CM | POA: Diagnosis not present

## 2016-11-22 DIAGNOSIS — Z87891 Personal history of nicotine dependence: Secondary | ICD-10-CM | POA: Diagnosis not present

## 2016-11-22 DIAGNOSIS — Z9181 History of falling: Secondary | ICD-10-CM | POA: Diagnosis not present

## 2016-11-24 ENCOUNTER — Encounter (HOSPITAL_COMMUNITY): Payer: Self-pay | Admitting: Emergency Medicine

## 2016-11-24 ENCOUNTER — Emergency Department (HOSPITAL_COMMUNITY)
Admission: EM | Admit: 2016-11-24 | Discharge: 2016-11-25 | Disposition: A | Discharge status: Home / Self Care | Payer: Medicare Other | Attending: Emergency Medicine | Admitting: Emergency Medicine

## 2016-11-24 ENCOUNTER — Encounter: Payer: Self-pay | Admitting: Orthopaedic Surgery

## 2016-11-24 ENCOUNTER — Ambulatory Visit (INDEPENDENT_AMBULATORY_CARE_PROVIDER_SITE_OTHER): Payer: Medicare Other | Admitting: Orthopaedic Surgery

## 2016-11-24 VITALS — BP 125/63 | HR 81

## 2016-11-24 DIAGNOSIS — F419 Anxiety disorder, unspecified: Secondary | ICD-10-CM | POA: Diagnosis not present

## 2016-11-24 DIAGNOSIS — R600 Localized edema: Secondary | ICD-10-CM | POA: Diagnosis not present

## 2016-11-24 DIAGNOSIS — J9811 Atelectasis: Secondary | ICD-10-CM | POA: Diagnosis not present

## 2016-11-24 DIAGNOSIS — R410 Disorientation, unspecified: Secondary | ICD-10-CM | POA: Insufficient documentation

## 2016-11-24 DIAGNOSIS — R05 Cough: Secondary | ICD-10-CM | POA: Insufficient documentation

## 2016-11-24 DIAGNOSIS — I639 Cerebral infarction, unspecified: Secondary | ICD-10-CM | POA: Diagnosis not present

## 2016-11-24 DIAGNOSIS — Z951 Presence of aortocoronary bypass graft: Secondary | ICD-10-CM | POA: Insufficient documentation

## 2016-11-24 DIAGNOSIS — I517 Cardiomegaly: Secondary | ICD-10-CM | POA: Diagnosis not present

## 2016-11-24 DIAGNOSIS — Z79899 Other long term (current) drug therapy: Secondary | ICD-10-CM | POA: Diagnosis not present

## 2016-11-24 DIAGNOSIS — Z7982 Long term (current) use of aspirin: Secondary | ICD-10-CM | POA: Diagnosis not present

## 2016-11-24 DIAGNOSIS — Z85038 Personal history of other malignant neoplasm of large intestine: Secondary | ICD-10-CM | POA: Insufficient documentation

## 2016-11-24 DIAGNOSIS — G8929 Other chronic pain: Secondary | ICD-10-CM | POA: Diagnosis not present

## 2016-11-24 DIAGNOSIS — R102 Pelvic and perineal pain: Secondary | ICD-10-CM | POA: Diagnosis not present

## 2016-11-24 DIAGNOSIS — R531 Weakness: Secondary | ICD-10-CM | POA: Diagnosis not present

## 2016-11-24 DIAGNOSIS — M25551 Pain in right hip: Secondary | ICD-10-CM | POA: Diagnosis not present

## 2016-11-24 DIAGNOSIS — M5489 Other dorsalgia: Secondary | ICD-10-CM

## 2016-11-24 DIAGNOSIS — I10 Essential (primary) hypertension: Secondary | ICD-10-CM | POA: Insufficient documentation

## 2016-11-24 DIAGNOSIS — S92355A Nondisplaced fracture of fifth metatarsal bone, left foot, initial encounter for closed fracture: Secondary | ICD-10-CM | POA: Diagnosis not present

## 2016-11-24 DIAGNOSIS — I2510 Atherosclerotic heart disease of native coronary artery without angina pectoris: Secondary | ICD-10-CM | POA: Insufficient documentation

## 2016-11-24 DIAGNOSIS — Z87891 Personal history of nicotine dependence: Secondary | ICD-10-CM | POA: Insufficient documentation

## 2016-11-24 DIAGNOSIS — R0902 Hypoxemia: Secondary | ICD-10-CM | POA: Diagnosis not present

## 2016-11-24 DIAGNOSIS — R4182 Altered mental status, unspecified: Secondary | ICD-10-CM | POA: Diagnosis not present

## 2016-11-24 DIAGNOSIS — M545 Low back pain: Secondary | ICD-10-CM | POA: Diagnosis not present

## 2016-11-24 DIAGNOSIS — I69354 Hemiplegia and hemiparesis following cerebral infarction affecting left non-dominant side: Secondary | ICD-10-CM | POA: Diagnosis not present

## 2016-11-24 DIAGNOSIS — M7989 Other specified soft tissue disorders: Secondary | ICD-10-CM

## 2016-11-24 DIAGNOSIS — Z9181 History of falling: Secondary | ICD-10-CM | POA: Diagnosis not present

## 2016-11-24 DIAGNOSIS — S79911A Unspecified injury of right hip, initial encounter: Secondary | ICD-10-CM | POA: Diagnosis not present

## 2016-11-24 NOTE — ED Provider Notes (Signed)
[facility] DEPT Provider Note   CSN: [id number] Arrival date & time: 11/24/16  2347  By signing my name below, I, Margit Banda, attest that this documentation has been prepared under the direction and in the presence of Ezequiel Essex, MD. Electronically Signed: Margit Banda, ED Scribe. 11/25/16. 12:28 AM.    History   Chief Complaint Chief Complaint  Patient presents with  . Altered Mental Status    HPI Craig Fields is a 76 y.o. male with a PMHx of CVA, CABG, and HLD who presents to the Emergency Department complaining intermittent confusion starting earlier today. Per wife, he was at the doctor,(11/25/16 at 2pm), for his broken foot (left), when he became confused of his whereabouts. Per wife, before going to bed, pt was confused on where he was sleeping. After sleeping for about 1.5 hours pt woke and was again confused on his whereabouts. Wife gave him baby aspirin PTA. Secondary to onset, pt c/o right hip pain and broken left foot s/p a fall last week. No new falls. Per wife, his speech is normal. Associated sx include a cough and left sided weakness. No h/o of DM. Pt reports taking ~12 percocet with in the last 4-5 days. He has been constipated since taking pain medication. Pt is not currently on any anticoagulant. Denies urgency, frequency, hematuria, dysuria, difficulty urinating.     The history is provided by the patient and the spouse. No language interpreter was used.    Past Medical History:  Diagnosis Date  . Anxiety disorder   . Chronic back pain   . Colon cancer [facility]) May 2007  . Coronary atherosclerosis of native coronary artery    Multivessel status post CABG  . 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 Essential hypertension, benign   . Hyperlipidemia   . IGT (impaired glucose tolerance)   . Obesity   . OSA (obstructive sleep apnea)   . Seizures ([place])    Age 11 or 3, no meds and no reoccurances    Patient Active Problem List   Diagnosis Date Noted  . Special screening for malignant neoplasm of prostate 09/14/2014  . Pain in joint, shoulder region 03/27/2013  . Muscle weakness (generalized) 03/27/2013  . Left leg weakness 02/28/2013  . Difficulty in walking(719.7) 02/28/2013  . Skin lesion 12/07/2012  . Skin tag 11/16/2011  . Prediabetes 07/19/2011  . 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 Mixed hyperlipidemia 02/21/2008  . Obesity, Class II, BMI 35.0-39.9, with comorbidity (see actual BMI) 02/21/2008  . Essential hypertension, benign 02/21/2008  . Coronary atherosclerosis of native coronary artery 02/21/2008  . SLEEP APNEA 02/21/2008    Past Surgical History:  Procedure Laterality Date  . COLONOSCOPY N/A 07/15/2015   Procedure: COLONOSCOPY;  Surgeon: Aviva Signs, MD;  Location: [facility] ENDO SUITE;  Service: Gastroenterology;  Laterality: N/A;  . CORONARY ARTERY BYPASS GRAFT  2004   x4  . INCISIONAL HERNIA REPAIR N/A 01/01/2013   Procedure: HERNIA REPAIR INCISIONAL;  Surgeon: Jamesetta So, MD;  Location: [facility] ORS;  Service: General;  Laterality: N/A;  umbilical area  . INSERTION OF MESH N/A 01/01/2013   Procedure: INSERTION OF MESH;  Surgeon: Jamesetta So, MD;  Location: [facility] ORS;  Service: General;  Laterality: N/A;  . Left inguinal  hernia repair  2005  . [name]  2007       Home Medications    Prior to Admission medications   Medication Sig Start  Date End Date Taking? Authorizing Provider  acetaminophen (TYLENOL) 500 MG tablet Take 1,000 mg by mouth every 6 (six) hours as needed for moderate pain.    Yes Historical Provider, MD  aspirin (ASPIRIN LOW DOSE) 81 MG EC tablet Take 81 mg by mouth every evening.    Yes Historical Provider, MD  clopidogrel (PLAVIX) 75 MG tablet TAKE 1 TABLET (75 MG TOTAL) BY MOUTH DAILY. 09/27/16  Yes Fayrene Helper, MD  clotrimazole-betamethasone (LOTRISONE) cream APPLY TO AFFECTED AREA TWICE A DAY 12/30/15  Yes Fayrene Helper,  MD  Coenzyme Q10 (CO Q-10) 200 MG CAPS Take 1 tablet by mouth daily.    Yes Historical Provider, MD  KLOR-CON M20 20 MEQ tablet TAKE 2 TABLETS (40 MEQ TOTAL) BY MOUTH DAILY. 09/27/16  Yes Fayrene Helper, MD  lisinopril (PRINIVIL,ZESTRIL) 20 MG tablet TAKE 1/2 TABLET BY MOUTH DAILY. 02/24/15  Yes Fayrene Helper, MD  metoprolol (LOPRESSOR) 50 MG tablet TAKE 1/2 TABLET BY MOUTH 2 (TWO) TIMES DAILY. 09/27/16  Yes Fayrene Helper, MD  Multiple Vitamins-Minerals ([employer] SPECTRAVITE) TABS Take 1 tablet by mouth daily.    Yes Historical Provider, MD  Omega-3 Fatty Acids (FISH OIL) 1000 MG CAPS Take 2 capsules by mouth at bedtime. 1200 mg   Yes Historical Provider, MD  oxyCODONE-acetaminophen (PERCOCET/ROXICET) 5-325 MG tablet Take 1 tablet by mouth every 6 (six) hours as needed. 11/19/16  Yes Milton Ferguson, MD  polyethylene glycol powder (GLYCOLAX/MIRALAX) powder Take 0.5 Containers by mouth daily as needed for moderate constipation.  07/01/15  Yes Historical Provider, MD  simvastatin (ZOCOR) 20 MG tablet TAKE 1 TABLET BY MOUTH AT BEDTIME 06/30/16  Yes Fayrene Helper, MD  sodium chloride (OCEAN) 0.65 % SOLN nasal spray Place 1 spray into both nostrils as needed for congestion.   Yes Historical Provider, MD  triamterene-hydrochlorothiazide (DYAZIDE) 37.5-25 MG capsule TAKE ONE CAPSULE BY MOUTH EVERY MORNING 06/30/16  Yes Fayrene Helper, MD    Family History Family History  Problem Relation Age of Onset  . Pancreatic cancer Mother   . Heart disease Father   . Stroke Brother   . Heart disease Brother   . Heart disease Brother   . COPD Sister   . Heart disease Sister   . Skin cancer Sister   . Prostate cancer Sister     Social History Social History  Substance Use Topics  . Smoking status: Former Smoker    Packs/day: 0.50    Years: 5.00    Types: Cigarettes    Quit date: 12/27/1980  . Smokeless tobacco: Former Systems developer    Types: Chew    Quit date: 01/24/2013  . Alcohol use No      Allergies   Bee venom and Niacin   Review of Systems Review of Systems  All other systems reviewed and are negative.   A complete 10 system review of systems was obtained and all systems are negative except as noted in the HPI and PMH.    Physical Exam Updated Vital Signs BP 132/55   Pulse 80   Temp 98.1 F (36.7 C) (Oral)   Resp 18   Ht 5\' 9"  (1.753 m)   Wt 282 lb (127.9 kg)   SpO2 96%   BMI 41.64 kg/m   Physical Exam  Constitutional: He is oriented to person, place, and time. He appears well-developed and well-nourished. No distress.  HENT:  Head: Normocephalic and atraumatic.  Right Ear: Hearing normal.  Left Ear: Hearing normal.  Nose: Nose normal.  Mouth/Throat: Oropharynx is clear and moist and mucous membranes are normal.  Eyes: Conjunctivae and EOM are normal. Pupils are equal, round, and reactive to light.  Neck: Normal range of motion. Neck supple.  No C spine tenderness  Cardiovascular: Regular rhythm, S1 normal and S2 normal.  Exam reveals no gallop and no friction rub.   No murmur heard. Pulmonary/Chest: Effort normal and breath sounds normal. No respiratory distress. He exhibits no tenderness.  Abdominal: Soft. Normal appearance and bowel sounds are normal. There is no hepatosplenomegaly. There is no tenderness. There is no rebound, no guarding, no tenderness at McBurney's point and negative Murphy's sign. No hernia.  Reducible ventral hernia  Musculoskeletal: Normal range of motion. He exhibits edema and tenderness.  Left sided weakness at baseline.  Pain with range of motion to right hip. No  T or L spine tenderness CN 2-12 intact  5/5 strength on right side Diffuse swelling of left leg (chronic per family) DP and PT pulses present with Doppler. TTP L lateral foot   Neurological: He is alert and oriented to person, place, and time. He has normal strength. No cranial nerve deficit or sensory deficit. Coordination normal. GCS eye subscore is 4.  GCS verbal subscore is 5. GCS motor subscore is 6.  Skin: Skin is warm, dry and intact. No rash noted. No cyanosis.  Psychiatric: He has a normal mood and affect. His speech is normal and behavior is normal. Thought content normal.  Nursing note and vitals reviewed.    ED Treatments / Results  DIAGNOSTIC STUDIES: Oxygen Saturation is 99% on RA, normal by my interpretation.   COORDINATION OF CARE: 12:13 AM-Discussed next steps with pt. Pt verbalized understanding and is agreeable with the plan.    Labs (all labs ordered are listed, but only abnormal results are displayed) Labs Reviewed  COMPREHENSIVE METABOLIC PANEL - Abnormal; Notable for the following:       Result Value   Sodium 133 (*)    Chloride 97 (*)    Glucose, Bld 146 (*)    BUN 29 (*)    Alkaline Phosphatase 36 (*)    GFR calc non Af Amer 59 (*)    All other components within normal limits  URINALYSIS, ROUTINE W REFLEX MICROSCOPIC - Abnormal; Notable for the following:    Leukocytes, UA MODERATE (*)    All other components within normal limits  URINE CULTURE  CBC WITH DIFFERENTIAL/PLATELET  TROPONIN I  RAPID URINE DRUG SCREEN, HOSP PERFORMED  AMMONIA    EKG  EKG Interpretation  Date/Time:  Thursday November 25 2016 00:03:40 EDT Ventricular Rate:  77 PR Interval:    QRS Duration: 108 QT Interval:  395 QTC Calculation: 447 R Axis:   -45 Text Interpretation:  Sinus rhythm Short PR interval Probable left atrial enlargement LAD, consider left anterior fascicular block Abnormal R-wave progression, late transition Baseline wander in lead(s) II III aVF V2 wandering baseline, Artifact Confirmed by Wyvonnia Dusky  MD, Abiha Lukehart [phone]) on 11/25/2016 1:09:54 AM       Radiology Dg Chest 2 View  Result Date: 11/25/2016 CLINICAL DATA:  76 year old male with altered mental status EXAM: CHEST  2 VIEW COMPARISON:  Chest radiograph dated 11/26/2013 FINDINGS: There is stable cardiomegaly. Median sternotomy wires and CABG vascular  clips noted. There are bibasilar atelectatic changes. Increased density over the left lung base best seen on the lateral projection may represent atelectasis/scarring versus pneumonia. Clinical correlation is recommended. No significant pleural effusion. No pneumothorax.  There is osteopenia with degenerative changes of the shoulders. No acute osseous pathology. IMPRESSION: 1. Bibasilar subsegmental atelectasis. Left lung base density may represent atelectasis versus pneumonia. Clinical correlation recommended. 2. Stable cardiomegaly and CABG changes. Electronically Signed   By: Anner Crete M.D.   On: 11/25/2016 01:24   Dg Lumbar Spine Complete  Result Date: 11/25/2016 CLINICAL DATA:  Right hip and low back pain after a fall. EXAM: LUMBAR SPINE - COMPLETE 4+ VIEW COMPARISON:  None. FINDINGS: Examination is limited due to difficulties in patient positioning and left arm positioning. Diffuse bone demineralization. Degenerative changes throughout the lumbar spine with prominent endplate hypertrophic changes throughout. Normal alignment of the lumbar spine is suggested. No vertebral compression deformities. Visualized cortex appears intact. Visualized sacrum appears intact. IMPRESSION: Technically limited study. Diffuse degenerative change throughout the lumbar spine. Normal alignment. No acute fractures identified. Electronically Signed   By: Lucienne Capers M.D.   On: 11/25/2016 01:27   Ct Head Wo Contrast  Result Date: 11/25/2016 CLINICAL DATA:  76 year old male with altered mental status. Past medical history of CVA. EXAM: CT HEAD WITHOUT CONTRAST TECHNIQUE: Contiguous axial images were obtained from the base of the skull through the vertex without intravenous contrast. COMPARISON:  None. FINDINGS: Brain: There is no acute intracranial hemorrhage. There is mild prominence of the sulci. There is dilatation of the ventricles out of proportion is within sulci which may represent more prominent central  volume loss versus normal pressure hydrocephalus. Clinical correlation is recommended. Moderate periventricular and deep white matter chronic microvascular ischemic changes noted. A focal area of old infarct and encephalomalacia noted in the right periventricular white matter and corona radiata. Prominent posterior fossa CSF space may represent a mega cisterna magna versus an arachnoid cyst. There is no mass effect or midline shift. Vascular: No hyperdense vessel or unexpected calcification. Skull: Normal. Negative for fracture or focal lesion. Sinuses/Orbits: Mild mucoperiosteal thickening of the paranasal sinuses. No air-fluid levels. The mastoid air cells are clear. Other: None IMPRESSION: 1. No acute intracranial hemorrhage. 2. Age-related atrophy and chronic microvascular ischemic changes. Correlation with clinical exam is recommended to evaluate for NPH. 3. Old right periventricular infarct and encephalomalacia. 4. 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 Electronically Signed   By: Anner Crete M.D.   On: 11/25/2016 01:29   Ct Chest Wo Contrast  Result Date: 11/25/2016 CLINICAL DATA:  Weakness.  Possible pneumonia.  Previous stroke. EXAM: CT CHEST WITHOUT CONTRAST TECHNIQUE: Multidetector CT imaging of the chest was performed following the standard protocol without IV contrast. COMPARISON:  None. FINDINGS: Cardiovascular: Postoperative coronary bypass. Normal heart size. No pericardial effusion. Normal caliber thoracic aorta with scattered calcifications. Mediastinum/Nodes: No enlarged mediastinal or axillary lymph nodes. Thyroid gland, trachea, and esophagus demonstrate no significant findings. Lungs/Pleura: Atelectasis in the lung bases. No focal consolidation or airspace disease. Pleural thickening and pleural calcification in the left base may be due to old infection. No pleural effusions. No pneumothorax. Upper Abdomen: Calcified granulomas in the spleen and liver. Small  esophageal hiatal hernia. Musculoskeletal: Degenerative changes in the spine. Median sternotomy. No destructive bone lesions. IMPRESSION: Pleural thickening and calcification on the left may indicate old infection. Atelectasis in the lung bases. No focal consolidation. Electronically Signed   By: Lucienne Capers M.D.   On: 11/25/2016 02:57   Ct Pelvis Wo Contrast  Result Date: 11/25/2016 CLINICAL DATA:  76 year old male with pelvic pain. Evaluate for pelvic fracture. EXAM: CT PELVIS WITHOUT CONTRAST TECHNIQUE: Multidetector CT imaging of the  pelvis was performed following the standard protocol without intravenous contrast. COMPARISON:  None. FINDINGS: Urinary Tract: The visualized portions of the distal ureters appear unremarkable. There is partial herniation of the bladder into the right inguinal canal. No associated inflammatory changes. Bowel: Dense stool noted throughout the colon compatible with constipation. There is postsurgical changes of partial sigmoid resection with anastomotic suture. No bowel dilatation in the visualized lower abdomen and pelvis. Vascular/Lymphatic: Mild atherosclerotic disease. No adenopathy within the pelvis. Reproductive: Enlarged prostate gland with median lobe hypertrophy. The prostate measures approximately 6.3 cm in transverse diameter. Other: There is a 7.9 cm broad-based midline ventral hernia defect with herniation of a short segment of small bowel into the hernia. No evidence of obstruction or associated inflammation. A moderate size right inguinal hernia noted containing portion of the bladder. Musculoskeletal: There is osteopenia with degenerative changes of the lower lumbar spine. L5-S1 disc desiccation with vacuum phenomena noted. Lower lumbar facet arthropathy. No acute fracture identified. There is no dislocation. IMPRESSION: 1. No acute fracture or dislocation. Advanced osteopenia limits evaluation for fracture. 2. Moderate right inguinal hernia with herniation of  a portion of the bladder. 3. Broad-based anterior pelvic ventral hernia containing a short segment of small bowel without evidence of obstruction or inflammation. Electronically Signed   By: Anner Crete M.D.   On: 11/25/2016 03:04   Dg Hip Unilat W Or Wo Pelvis 2-3 Views Right  Result Date: 11/25/2016 CLINICAL DATA:  Right hip and low back pain after a fall. Altered mental status. EXAM: DG HIP (WITH OR WITHOUT PELVIS) 2-3V RIGHT COMPARISON:  Abdomen 01/31/2008 FINDINGS: Diffuse bone demineralization. Pelvis appears intact. Degenerative changes in the lower lumbar spine and in both hips. No evidence of acute fracture or dislocation. No focal bone lesion or bone destruction. SI joints and symphysis pubis are not displaced. IMPRESSION: Degenerative changes in the right hip. No acute fractures identified. Electronically Signed   By: Lucienne Capers M.D.   On: 11/25/2016 01:24    Procedures Procedures (including critical care time)  Medications Ordered in ED Medications - No data to display   Initial Impression / Assessment and Plan / ED Course  I have reviewed the triage vital signs and the nursing notes.  Pertinent labs & imaging results that were available during my care of the patient were reviewed by me and considered in my medical decision making (see chart for details).     Patient with intermittent confusion since 2 PM today. Family states he was confused at home wouldn't answer questions at the doctor's office or at home. There was confusion about where he was sleeping at home tonight.  Feels back to baseline now. Notably has a metatarsal fracture has been taking Percocet but he states he has not had any today.  Labs reassuring.  UA with LE and WBCs.  Culture sent.  CT head stable. Right hip x-ray negative. Chest x-ray does show atelectasis versus infiltrate at left base. Patient denies any cough or fever. No leukocytosis or hypoxia.  Patient continues to complain of right hip  pain. Unwilling to try to ambulate with his left foot fracture as well as left-sided weakness and he does not have his cane.  He is alert and oriented 3 and back to baseline per his wife. Suspect his transient confusion could be due to pain medication use.  Wife also states he has had poor sleep.  Back to baseline now. No new neuro deficits. CT head stable.  Doubt new CVA.  CT obtained to evaluate R hip pain given ongoing pain.  No fracture seen.  No PNA seen on chest CT.  Will arrange for US doppler of RLE tomorrow. No CP or SOB.  Wear cam walker boot as directed by Dr. Luna Glasgow. Follow up with PCP. Return precautions discussed. Limit pain medication use.  Follow up for [place] LLE tomorrow.    Final Clinical Impressions(s) / ED Diagnoses   Final diagnoses:  Confusion    New Prescriptions New Prescriptions   No medications on file   I personally performed the services described in this documentation, which was scribed in my presence. The recorded information has been reviewed and is accurate.     Ezequiel Essex, MD 11/25/16 [phone]

## 2016-11-24 NOTE — ED Triage Notes (Signed)
Patient has had increased confusion starting yesterday at Dr. Luna Glasgow office, unable to answer questions.  Start having confusion approx 2 hours ago, ems called out.

## 2016-11-24 NOTE — Progress Notes (Signed)
Subjective:    Patient ID: Craig Fields, male    DOB: [birth year], 76 y.o.   MRN: [id number]  HPI He fell at home on 11-19-16 and hurt his left foot. He was seen in the ER. X-rays showed a nondisplaced fracture of the left fifth metatarsal.  He has no other injury.  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 He has a left lower leg brace which he was wearing but the straps are worn out and it gives little support.  He will need new brace repairs made.  I will give CAM walker.  He says he also hurt his lower back.  I will order x-rays at the hospital and review when he returns.   Review of Systems  HENT: Positive for congestion.   Respiratory: Positive for shortness of breath. Negative for cough.   Cardiovascular: Positive for leg swelling. Negative for chest pain.  Endocrine: Positive for cold intolerance.  Musculoskeletal: Positive for arthralgias, back pain, gait problem, joint swelling and myalgias.  Allergic/Immunologic: Positive for environmental allergies.  Neurological: Positive for weakness and numbness.       Left sided hemiparesis with hand deformity.   Past Medical History:  Diagnosis Date  . Anxiety disorder   . Chronic back pain   . Colon cancer [facility]) May 2007  . Coronary atherosclerosis of native coronary artery    Multivessel status post CABG  . 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 Essential hypertension, benign   . Hyperlipidemia   . IGT (impaired glucose tolerance)   . Obesity   . OSA (obstructive sleep apnea)   . Seizures ([place])    Age 22 or 3, no meds and no reoccurances    Past Surgical History:  Procedure Laterality Date  . COLONOSCOPY N/A 07/15/2015   Procedure: COLONOSCOPY;  Surgeon: Aviva Signs, MD;  Location: [facility] ENDO SUITE;  Service: Gastroenterology;  Laterality: N/A;  . CORONARY ARTERY BYPASS GRAFT  2004   x4  . INCISIONAL HERNIA REPAIR N/A 01/01/2013   Procedure: HERNIA REPAIR INCISIONAL;  Surgeon: Jamesetta So, MD;   Location: [facility] ORS;  Service: General;  Laterality: N/A;  umbilical area  . INSERTION OF MESH N/A 01/01/2013   Procedure: INSERTION OF MESH;  Surgeon: Jamesetta So, MD;  Location: [facility] ORS;  Service: General;  Laterality: N/A;  . Left inguinal  hernia repair  2005  . [name]  2007    Current Outpatient Prescriptions on File Prior to Visit  Medication Sig Dispense Refill  . acetaminophen (TYLENOL) 500 MG tablet Take 1,000 mg by mouth every 6 (six) hours as needed for moderate pain.     Marland Kitchen aspirin (ASPIRIN LOW DOSE) 81 MG EC tablet Take 81 mg by mouth every evening.     . clopidogrel (PLAVIX) 75 MG tablet TAKE 1 TABLET (75 MG TOTAL) BY MOUTH DAILY. 90 tablet 1  . clotrimazole-betamethasone (LOTRISONE) cream APPLY TO AFFECTED AREA TWICE A DAY 30 g 1  . Coenzyme Q10 (CO Q-10) 200 MG CAPS Take 1 tablet by mouth daily.     Marland Kitchen KLOR-CON M20 20 MEQ tablet TAKE 2 TABLETS (40 MEQ TOTAL) BY MOUTH DAILY. 180 tablet 1  . lisinopril (PRINIVIL,ZESTRIL) 20 MG tablet TAKE 1/2 TABLET BY MOUTH DAILY. 30 tablet 4  . metoprolol (LOPRESSOR) 50 MG tablet TAKE 1/2 TABLET BY MOUTH 2 (TWO) TIMES DAILY. 90 tablet 1  . Multiple Vitamins-Minerals ([employer] SPECTRAVITE) TABS Take 1 tablet by mouth daily.     Marland Kitchen  Omega-3 Fatty Acids (FISH OIL) 1000 MG CAPS Take 2 capsules by mouth at bedtime. 1200 mg    . oxyCODONE-acetaminophen (PERCOCET/ROXICET) 5-325 MG tablet Take 1 tablet by mouth every 6 (six) hours as needed. 20 tablet 0  . polyethylene glycol powder (GLYCOLAX/MIRALAX) powder Take 0.5 Containers by mouth daily as needed for moderate constipation.     . simvastatin (ZOCOR) 20 MG tablet TAKE 1 TABLET BY MOUTH AT BEDTIME 30 tablet 5  . sodium chloride (OCEAN) 0.65 % SOLN nasal spray Place 1 spray into both nostrils as needed for congestion.    . triamterene-hydrochlorothiazide (DYAZIDE) 37.5-25 MG capsule TAKE ONE CAPSULE BY MOUTH EVERY MORNING 30 capsule 4   No current facility-administered medications on file prior to  visit.     Social History   Social History  . Marital status: Married    Spouse name: N/A  . Number of children: 2  . Years of education: N/A   Occupational History  . retired     Social History Main Topics  . Smoking status: Former Smoker    Packs/day: 0.50    Years: 5.00    Types: Cigarettes    Quit date: 12/27/1980  . Smokeless tobacco: Former Systems developer    Types: Chew    Quit date: 01/24/2013  . Alcohol use No  . Drug use: No  . Sexual activity: Yes    Birth control/ protection: None   Other Topics Concern  . Not on file   Social History Narrative  . No narrative on file    Family History  Problem Relation Age of Onset  . Pancreatic cancer Mother   . Heart disease Father   . Stroke Brother   . Heart disease Brother   . Heart disease Brother   . COPD Sister   . Heart disease Sister   . Skin cancer Sister   . Prostate cancer Sister     BP 125/63   Pulse 81      Objective:   Physical Exam  Constitutional: He is oriented to person, place, and time. He appears well-developed and well-nourished.  HENT:  Head: Normocephalic and atraumatic.  Eyes: Conjunctivae and EOM are normal. Pupils are equal, round, and reactive to light.  Neck: Normal range of motion. Neck supple.  Cardiovascular: Normal rate, regular rhythm and intact distal pulses.   Pulmonary/Chest: Effort normal.  Abdominal: Soft.  Musculoskeletal: He exhibits tenderness (swelling of the left foot and ankle, pain lateral left foot, decreased motor secondary to old CVA.  Color normal.).  Neurological: He is alert and oriented to person, place, and time. He displays abnormal reflex. No cranial nerve deficit. He exhibits abnormal muscle tone. Coordination normal.  Left sided hemiparesis   Skin: Skin is warm and dry.  Psychiatric: He has a normal mood and affect. His behavior is normal. Judgment and thought content normal.  Vitals reviewed.         Assessment & Plan:   Encounter Diagnosis  Name  Primary?  . Closed nondisplaced fracture of fifth metatarsal bone of left foot, initial encounter Yes   A CAM walker is given.  Replace straps on brace he has already.  Return in two weeks.  Sheet for contrast baths given.  Call if any problem.  To get back x-rays at the hospital.  Electronically Signed Sanjuana Kava, MD 3/14/20183:19 PM

## 2016-11-25 ENCOUNTER — Emergency Department (HOSPITAL_COMMUNITY): Payer: Medicare Other

## 2016-11-25 ENCOUNTER — Ambulatory Visit (HOSPITAL_COMMUNITY)
Admission: RE | Admit: 2016-11-25 | Discharge: 2016-11-25 | Disposition: A | Discharge status: Home / Self Care | Payer: Medicare Other | Source: Ambulatory Visit | Attending: Emergency Medicine | Admitting: Emergency Medicine

## 2016-11-25 DIAGNOSIS — M7989 Other specified soft tissue disorders: Secondary | ICD-10-CM | POA: Diagnosis not present

## 2016-11-25 DIAGNOSIS — S79911A Unspecified injury of right hip, initial encounter: Secondary | ICD-10-CM | POA: Diagnosis not present

## 2016-11-25 DIAGNOSIS — Z951 Presence of aortocoronary bypass graft: Secondary | ICD-10-CM | POA: Insufficient documentation

## 2016-11-25 DIAGNOSIS — R410 Disorientation, unspecified: Secondary | ICD-10-CM | POA: Diagnosis not present

## 2016-11-25 DIAGNOSIS — M545 Low back pain: Secondary | ICD-10-CM | POA: Diagnosis not present

## 2016-11-25 DIAGNOSIS — R102 Pelvic and perineal pain: Secondary | ICD-10-CM | POA: Diagnosis not present

## 2016-11-25 DIAGNOSIS — R4182 Altered mental status, unspecified: Secondary | ICD-10-CM | POA: Diagnosis not present

## 2016-11-25 DIAGNOSIS — J9811 Atelectasis: Secondary | ICD-10-CM | POA: Diagnosis not present

## 2016-11-25 DIAGNOSIS — R600 Localized edema: Secondary | ICD-10-CM | POA: Diagnosis not present

## 2016-11-25 DIAGNOSIS — I639 Cerebral infarction, unspecified: Secondary | ICD-10-CM | POA: Diagnosis not present

## 2016-11-25 DIAGNOSIS — I517 Cardiomegaly: Secondary | ICD-10-CM | POA: Diagnosis not present

## 2016-11-25 DIAGNOSIS — M25551 Pain in right hip: Secondary | ICD-10-CM | POA: Diagnosis not present

## 2016-11-25 DIAGNOSIS — M79605 Pain in left leg: Secondary | ICD-10-CM | POA: Insufficient documentation

## 2016-11-25 LAB — CBC WITH DIFFERENTIAL/PLATELET
BASOS ABS: 0 10*3/uL (ref 0.0–0.1)
BASOS PCT: 0 %
EOS PCT: 3 %
Eosinophils Absolute: 0.3 10*3/uL (ref 0.0–0.7)
HCT: 45.3 % (ref 39.0–52.0)
Hemoglobin: 15.2 g/dL (ref 13.0–17.0)
LYMPHS PCT: 20 %
Lymphs Abs: 1.6 10*3/uL (ref 0.7–4.0)
MCH: 30.8 pg (ref 26.0–34.0)
MCHC: 33.6 g/dL (ref 30.0–36.0)
MCV: 91.7 fL (ref 78.0–100.0)
Monocytes Absolute: 0.8 10*3/uL (ref 0.1–1.0)
Monocytes Relative: 10 %
Neutro Abs: 5.3 10*3/uL (ref 1.7–7.7)
Neutrophils Relative %: 67 %
PLATELETS: 210 10*3/uL (ref 150–400)
RBC: 4.94 MIL/uL (ref 4.22–5.81)
RDW: 13.7 % (ref 11.5–15.5)
WBC: 8 10*3/uL (ref 4.0–10.5)

## 2016-11-25 LAB — COMPREHENSIVE METABOLIC PANEL
ALBUMIN: 4.1 g/dL (ref 3.5–5.0)
ALT: 33 U/L (ref 17–63)
AST: 39 U/L (ref 15–41)
Alkaline Phosphatase: 36 U/L — ABNORMAL LOW (ref 38–126)
Anion gap: 8 (ref 5–15)
BUN: 29 mg/dL — AB (ref 6–20)
CHLORIDE: 97 mmol/L — AB (ref 101–111)
CO2: 28 mmol/L (ref 22–32)
CREATININE: 1.16 mg/dL (ref 0.61–1.24)
Calcium: 9.4 mg/dL (ref 8.9–10.3)
GFR calc Af Amer: >60 mL/min (ref >60)
GFR calc non Af Amer: 59 mL/min — ABNORMAL LOW (ref >60)
GLUCOSE: 146 mg/dL — AB (ref 65–99)
Potassium: 4.5 mmol/L (ref 3.5–5.1)
SODIUM: 133 mmol/L — AB (ref 135–145)
Total Bilirubin: 0.6 mg/dL (ref 0.3–1.2)
Total Protein: 7.6 g/dL (ref 6.5–8.1)

## 2016-11-25 LAB — RAPID URINE DRUG SCREEN, HOSP PERFORMED
AMPHETAMINES: NOT DETECTED
BENZODIAZEPINES: NOT DETECTED
Barbiturates: NOT DETECTED
Cocaine: NOT DETECTED
Opiates: NOT DETECTED
TETRAHYDROCANNABINOL: NOT DETECTED

## 2016-11-25 LAB — URINALYSIS, ROUTINE W REFLEX MICROSCOPIC
BACTERIA UA: NONE SEEN
BILIRUBIN URINE: NEGATIVE
Glucose, UA: NEGATIVE mg/dL
HGB URINE DIPSTICK: NEGATIVE
KETONES UR: NEGATIVE mg/dL
NITRITE: NEGATIVE
PH: 5 (ref 5.0–8.0)
Protein, ur: NEGATIVE mg/dL
SPECIFIC GRAVITY, URINE: 1.016 (ref 1.005–1.030)

## 2016-11-25 LAB — TROPONIN I: Troponin I: <0.03 ng/mL (ref <0.03)

## 2016-11-25 LAB — AMMONIA: Ammonia: 9 umol/L (ref 9–35)

## 2016-11-25 NOTE — ED Notes (Signed)
Patient unable to ambulate due to fractured foot, and left-sided paralysis.

## 2016-11-25 NOTE — ED Provider Notes (Signed)
Patient has returned for his outpatient ultrasound. Normal results discussed with him and multiple family members.   Carmin Muskrat, MD 11/25/16 [phone]

## 2016-11-25 NOTE — Discharge Instructions (Signed)
Return tomorrow for an ultrasound of your leg.  No evidence of pneumonia.  A urine culture is pending and you will be called if the results are positive.  Limit use of the pain medication as that could be causing confusion. Follow up with your doctor. Return to the ED if you develop new or worsening symptoms.

## 2016-11-26 ENCOUNTER — Other Ambulatory Visit: Payer: Self-pay | Admitting: Family Medicine

## 2016-11-26 DIAGNOSIS — F419 Anxiety disorder, unspecified: Secondary | ICD-10-CM | POA: Diagnosis not present

## 2016-11-26 DIAGNOSIS — I2510 Atherosclerotic heart disease of native coronary artery without angina pectoris: Secondary | ICD-10-CM | POA: Diagnosis not present

## 2016-11-26 DIAGNOSIS — G8929 Other chronic pain: Secondary | ICD-10-CM | POA: Diagnosis not present

## 2016-11-26 DIAGNOSIS — I10 Essential (primary) hypertension: Secondary | ICD-10-CM | POA: Diagnosis not present

## 2016-11-26 DIAGNOSIS — I69354 Hemiplegia and hemiparesis following cerebral infarction affecting left non-dominant side: Secondary | ICD-10-CM | POA: Diagnosis not present

## 2016-11-26 DIAGNOSIS — Z9181 History of falling: Secondary | ICD-10-CM | POA: Diagnosis not present

## 2016-11-26 LAB — URINE CULTURE: CULTURE: NO GROWTH

## 2016-11-29 DIAGNOSIS — I10 Essential (primary) hypertension: Secondary | ICD-10-CM | POA: Diagnosis not present

## 2016-11-29 DIAGNOSIS — F419 Anxiety disorder, unspecified: Secondary | ICD-10-CM | POA: Diagnosis not present

## 2016-11-29 DIAGNOSIS — I2510 Atherosclerotic heart disease of native coronary artery without angina pectoris: Secondary | ICD-10-CM | POA: Diagnosis not present

## 2016-11-29 DIAGNOSIS — I69354 Hemiplegia and hemiparesis following cerebral infarction affecting left non-dominant side: Secondary | ICD-10-CM | POA: Diagnosis not present

## 2016-11-29 DIAGNOSIS — G8929 Other chronic pain: Secondary | ICD-10-CM | POA: Diagnosis not present

## 2016-11-29 DIAGNOSIS — Z9181 History of falling: Secondary | ICD-10-CM | POA: Diagnosis not present

## 2016-11-30 DIAGNOSIS — I2510 Atherosclerotic heart disease of native coronary artery without angina pectoris: Secondary | ICD-10-CM | POA: Diagnosis not present

## 2016-11-30 DIAGNOSIS — Z9181 History of falling: Secondary | ICD-10-CM | POA: Diagnosis not present

## 2016-11-30 DIAGNOSIS — F419 Anxiety disorder, unspecified: Secondary | ICD-10-CM | POA: Diagnosis not present

## 2016-11-30 DIAGNOSIS — G8929 Other chronic pain: Secondary | ICD-10-CM | POA: Diagnosis not present

## 2016-11-30 DIAGNOSIS — I10 Essential (primary) hypertension: Secondary | ICD-10-CM | POA: Diagnosis not present

## 2016-11-30 DIAGNOSIS — I69354 Hemiplegia and hemiparesis following cerebral infarction affecting left non-dominant side: Secondary | ICD-10-CM | POA: Diagnosis not present

## 2016-12-01 ENCOUNTER — Other Ambulatory Visit: Payer: Self-pay | Admitting: Family Medicine

## 2016-12-01 ENCOUNTER — Ambulatory Visit: Payer: Medicare Other | Admitting: Orthopaedic Surgery

## 2016-12-02 DIAGNOSIS — F419 Anxiety disorder, unspecified: Secondary | ICD-10-CM | POA: Diagnosis not present

## 2016-12-02 DIAGNOSIS — Z9181 History of falling: Secondary | ICD-10-CM | POA: Diagnosis not present

## 2016-12-02 DIAGNOSIS — I10 Essential (primary) hypertension: Secondary | ICD-10-CM | POA: Diagnosis not present

## 2016-12-02 DIAGNOSIS — G8929 Other chronic pain: Secondary | ICD-10-CM | POA: Diagnosis not present

## 2016-12-02 DIAGNOSIS — I69354 Hemiplegia and hemiparesis following cerebral infarction affecting left non-dominant side: Secondary | ICD-10-CM | POA: Diagnosis not present

## 2016-12-02 DIAGNOSIS — I2510 Atherosclerotic heart disease of native coronary artery without angina pectoris: Secondary | ICD-10-CM | POA: Diagnosis not present

## 2016-12-03 DIAGNOSIS — I69354 Hemiplegia and hemiparesis following cerebral infarction affecting left non-dominant side: Secondary | ICD-10-CM | POA: Diagnosis not present

## 2016-12-03 DIAGNOSIS — F419 Anxiety disorder, unspecified: Secondary | ICD-10-CM | POA: Diagnosis not present

## 2016-12-03 DIAGNOSIS — Z9181 History of falling: Secondary | ICD-10-CM | POA: Diagnosis not present

## 2016-12-03 DIAGNOSIS — I2510 Atherosclerotic heart disease of native coronary artery without angina pectoris: Secondary | ICD-10-CM | POA: Diagnosis not present

## 2016-12-03 DIAGNOSIS — I10 Essential (primary) hypertension: Secondary | ICD-10-CM | POA: Diagnosis not present

## 2016-12-03 DIAGNOSIS — G8929 Other chronic pain: Secondary | ICD-10-CM | POA: Diagnosis not present

## 2016-12-06 ENCOUNTER — Ambulatory Visit (INDEPENDENT_AMBULATORY_CARE_PROVIDER_SITE_OTHER): Payer: Medicare Other | Admitting: Family Medicine

## 2016-12-06 ENCOUNTER — Encounter: Payer: Self-pay | Admitting: Family Medicine

## 2016-12-06 VITALS — BP 130/70 | HR 69 | Resp 15 | Ht 69.0 in | Wt 282.0 lb

## 2016-12-06 DIAGNOSIS — Z09 Encounter for follow-up examination after completed treatment for conditions other than malignant neoplasm: Secondary | ICD-10-CM | POA: Diagnosis not present

## 2016-12-06 DIAGNOSIS — S92355K Nondisplaced fracture of fifth metatarsal bone, left foot, subsequent encounter for fracture with nonunion: Secondary | ICD-10-CM

## 2016-12-06 DIAGNOSIS — R29898 Other symptoms and signs involving the musculoskeletal system: Secondary | ICD-10-CM | POA: Diagnosis not present

## 2016-12-06 DIAGNOSIS — I69959 Hemiplegia and hemiparesis following unspecified cerebrovascular disease affecting unspecified side: Secondary | ICD-10-CM | POA: Diagnosis not present

## 2016-12-06 NOTE — Patient Instructions (Signed)
f/u in 4 months, call if you need me before  We will work through [place] on trapeze bar needed.Hope to get this done by tomorrow afternoon  Full recovery, thankfully  Tylenol only for pain  Thank you  for choosing [facility]. We consider it a privelige to serve you.  Delivering excellent health care in a caring and  compassionate way is our goal.  Partnering with you,  so that together we can achieve this goal is our strategy.

## 2016-12-07 ENCOUNTER — Encounter: Payer: Self-pay | Admitting: Family Medicine

## 2016-12-07 DIAGNOSIS — G8929 Other chronic pain: Secondary | ICD-10-CM | POA: Diagnosis not present

## 2016-12-07 DIAGNOSIS — Z09 Encounter for follow-up examination after completed treatment for conditions other than malignant neoplasm: Secondary | ICD-10-CM | POA: Insufficient documentation

## 2016-12-07 DIAGNOSIS — I2510 Atherosclerotic heart disease of native coronary artery without angina pectoris: Secondary | ICD-10-CM | POA: Diagnosis not present

## 2016-12-07 DIAGNOSIS — I10 Essential (primary) hypertension: Secondary | ICD-10-CM | POA: Diagnosis not present

## 2016-12-07 DIAGNOSIS — I69354 Hemiplegia and hemiparesis following cerebral infarction affecting left non-dominant side: Secondary | ICD-10-CM | POA: Diagnosis not present

## 2016-12-07 DIAGNOSIS — F419 Anxiety disorder, unspecified: Secondary | ICD-10-CM | POA: Diagnosis not present

## 2016-12-07 DIAGNOSIS — Z9181 History of falling: Secondary | ICD-10-CM | POA: Diagnosis not present

## 2016-12-07 NOTE — Assessment & Plan Note (Signed)
Chronic s/p stroke , incapable of ADL's without assistance

## 2016-12-07 NOTE — Assessment & Plan Note (Signed)
persistent left upper and lower extremity weakness with contracture of LUE , residual effect of stroke, needs assistance to raise up and out of bed. Trapeze bar required to be used with hospital bed which he has, currently depending on grandson who normally does not live with him to do this as his spouse is unable Mobility and partial independence is currently further limited due to  Left foot fracture less than 41 weeks old and immobilized in a boot

## 2016-12-07 NOTE — Progress Notes (Signed)
   Craig Fields     MRN: [id number]      DOB: 04-17-1941   HPI Craig Fields  Patient in for follow up of recent ED visit(s). He initially presented on 11/19/2016 with fracture , nondisplaced of left 5th metatarsal. He was followed by ortho since, prescribed narcotic pain medication on 3/14 and was taken to the eD that sam evening acutely confused and c/o lLE swelling Head scan and venous doppler studies were negative, labs were non revealing , and pt noted to have recovered to himself in lkess than 24 hrs and has ahd no narcotic pain medication since, which isvery appropriate. States tylenol is affording the relief that he needs Discharge summary, and laboratory and radiology data are reviewed, and any questions or concerns about recent hospitalization are discussed. Wife, daughter and grandson are with him, requesting trapeze bar for use above his hospital bed so that he will be able to pull himself up to a point where he is noit needing to rely on the young strength of his visiting grandson, and can regain the independence of having his wife being able to care for him Left leg is in a long boot , fracture is being immobilized for cure  ROS Denies recent fever or chills. Denies sinus pressure, nasal congestion, ear pain or sore throat. Denies chest congestion, productive cough or wheezing. Denies chest pains, palpitations and leg swelling Denies abdominal pain, nausea, vomiting,diarrhea or constipation.   Denies dysuria, frequency, hesitancy or incontinence.  Denies depression, anxiety or insomnia. Denies skin break down or rash.   PE  BP 130/70   Pulse 69   Resp 15   Ht 5\' 9"  (1.753 m)   Wt 282 lb (127.9 kg)   SpO2 94%   BMI 41.64 kg/m   Patient alert and oriented and in no cardiopulmonary distress.  HEENT: No facial asymmetry, EOMI,   oropharynx pink and moist.  Neck decreased ROM<,no JVD, no mass.  Chest: Clear to auscultation bilaterally.  CVS: S1, S2 no murmurs, no  S3.Regular rate.  ABD: Soft non tender.   Ext: No edema  MS: decreased  ROM spine, shoulders, hips and knees.  Skin: Intact, no ulcerations or rash noted.  Psych: Good eye contact, normal affect. Memory intact not anxious or depressed appearing.  CNS: CN 2-12 intact, Grade 3 power,  In left upper and lower extremities, with muscle wasting and contracture of LUE  Assessment & Plan  Hemiplegia, late effect of cerebrovascular disease (HCC) persistent left upper and lower extremity weakness with contracture of LUE , residual effect of stroke, needs assistance to raise up and out of bed. Trapeze bar required to be used with hospital bed which he has, currently depending on grandson who normally does not live with him to do this as his spouse is unable Mobility and partial independence is currently further limited due to  Left foot fracture less than 29 weeks old and immobilized in a boot  Left leg weakness Chronic s/p stroke , incapable of ADL's without assistance

## 2016-12-08 ENCOUNTER — Ambulatory Visit (INDEPENDENT_AMBULATORY_CARE_PROVIDER_SITE_OTHER): Payer: Medicare Other

## 2016-12-08 ENCOUNTER — Ambulatory Visit (INDEPENDENT_AMBULATORY_CARE_PROVIDER_SITE_OTHER): Payer: Self-pay | Admitting: Orthopaedic Surgery

## 2016-12-08 DIAGNOSIS — I69354 Hemiplegia and hemiparesis following cerebral infarction affecting left non-dominant side: Secondary | ICD-10-CM | POA: Diagnosis not present

## 2016-12-08 DIAGNOSIS — F419 Anxiety disorder, unspecified: Secondary | ICD-10-CM | POA: Diagnosis not present

## 2016-12-08 DIAGNOSIS — I10 Essential (primary) hypertension: Secondary | ICD-10-CM | POA: Diagnosis not present

## 2016-12-08 DIAGNOSIS — S92355D Nondisplaced fracture of fifth metatarsal bone, left foot, subsequent encounter for fracture with routine healing: Secondary | ICD-10-CM | POA: Diagnosis not present

## 2016-12-08 DIAGNOSIS — G8929 Other chronic pain: Secondary | ICD-10-CM | POA: Diagnosis not present

## 2016-12-08 DIAGNOSIS — Z9181 History of falling: Secondary | ICD-10-CM | POA: Diagnosis not present

## 2016-12-08 DIAGNOSIS — I2510 Atherosclerotic heart disease of native coronary artery without angina pectoris: Secondary | ICD-10-CM | POA: Diagnosis not present

## 2016-12-08 NOTE — Progress Notes (Signed)
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  He has been wearing the CAM walker.  He has no new trauma.  His left foot has some swelling.  Vascular intact.  X-rays were done of the left foot, reported separately.  Encounter Diagnosis  Name Primary?  . Closed nondisplaced fracture of fifth metatarsal bone of left foot with routine healing, subsequent encounter Yes   Continue the CAM walker.  Return in one month.  X-rays on return.  Call if any problem.  Precautions discussed.  Electronically Signed Sanjuana Kava, MD 3/28/20182:10 PM

## 2016-12-09 DIAGNOSIS — I69354 Hemiplegia and hemiparesis following cerebral infarction affecting left non-dominant side: Secondary | ICD-10-CM | POA: Diagnosis not present

## 2016-12-09 DIAGNOSIS — G8929 Other chronic pain: Secondary | ICD-10-CM | POA: Diagnosis not present

## 2016-12-09 DIAGNOSIS — F419 Anxiety disorder, unspecified: Secondary | ICD-10-CM | POA: Diagnosis not present

## 2016-12-09 DIAGNOSIS — Z9181 History of falling: Secondary | ICD-10-CM | POA: Diagnosis not present

## 2016-12-09 DIAGNOSIS — I10 Essential (primary) hypertension: Secondary | ICD-10-CM | POA: Diagnosis not present

## 2016-12-09 DIAGNOSIS — I2510 Atherosclerotic heart disease of native coronary artery without angina pectoris: Secondary | ICD-10-CM | POA: Diagnosis not present

## 2016-12-10 DIAGNOSIS — F419 Anxiety disorder, unspecified: Secondary | ICD-10-CM | POA: Diagnosis not present

## 2016-12-10 DIAGNOSIS — Z9181 History of falling: Secondary | ICD-10-CM | POA: Diagnosis not present

## 2016-12-10 DIAGNOSIS — I10 Essential (primary) hypertension: Secondary | ICD-10-CM | POA: Diagnosis not present

## 2016-12-10 DIAGNOSIS — I69354 Hemiplegia and hemiparesis following cerebral infarction affecting left non-dominant side: Secondary | ICD-10-CM | POA: Diagnosis not present

## 2016-12-10 DIAGNOSIS — I2510 Atherosclerotic heart disease of native coronary artery without angina pectoris: Secondary | ICD-10-CM | POA: Diagnosis not present

## 2016-12-10 DIAGNOSIS — G8929 Other chronic pain: Secondary | ICD-10-CM | POA: Diagnosis not present

## 2016-12-14 ENCOUNTER — Ambulatory Visit: Payer: PRIVATE HEALTH INSURANCE | Admitting: Family Medicine

## 2016-12-15 DIAGNOSIS — Z9181 History of falling: Secondary | ICD-10-CM | POA: Diagnosis not present

## 2016-12-15 DIAGNOSIS — G8929 Other chronic pain: Secondary | ICD-10-CM | POA: Diagnosis not present

## 2016-12-15 DIAGNOSIS — I10 Essential (primary) hypertension: Secondary | ICD-10-CM | POA: Diagnosis not present

## 2016-12-15 DIAGNOSIS — I2510 Atherosclerotic heart disease of native coronary artery without angina pectoris: Secondary | ICD-10-CM | POA: Diagnosis not present

## 2016-12-15 DIAGNOSIS — I69354 Hemiplegia and hemiparesis following cerebral infarction affecting left non-dominant side: Secondary | ICD-10-CM | POA: Diagnosis not present

## 2016-12-15 DIAGNOSIS — F419 Anxiety disorder, unspecified: Secondary | ICD-10-CM | POA: Diagnosis not present

## 2016-12-16 ENCOUNTER — Ambulatory Visit: Payer: PRIVATE HEALTH INSURANCE | Admitting: Family Medicine

## 2016-12-17 DIAGNOSIS — I10 Essential (primary) hypertension: Secondary | ICD-10-CM | POA: Diagnosis not present

## 2016-12-17 DIAGNOSIS — Z9181 History of falling: Secondary | ICD-10-CM | POA: Diagnosis not present

## 2016-12-17 DIAGNOSIS — G8929 Other chronic pain: Secondary | ICD-10-CM | POA: Diagnosis not present

## 2016-12-17 DIAGNOSIS — I69354 Hemiplegia and hemiparesis following cerebral infarction affecting left non-dominant side: Secondary | ICD-10-CM | POA: Diagnosis not present

## 2016-12-17 DIAGNOSIS — I2510 Atherosclerotic heart disease of native coronary artery without angina pectoris: Secondary | ICD-10-CM | POA: Diagnosis not present

## 2016-12-17 DIAGNOSIS — F419 Anxiety disorder, unspecified: Secondary | ICD-10-CM | POA: Diagnosis not present

## 2016-12-20 DIAGNOSIS — I2510 Atherosclerotic heart disease of native coronary artery without angina pectoris: Secondary | ICD-10-CM | POA: Diagnosis not present

## 2016-12-20 DIAGNOSIS — G8929 Other chronic pain: Secondary | ICD-10-CM | POA: Diagnosis not present

## 2016-12-20 DIAGNOSIS — I10 Essential (primary) hypertension: Secondary | ICD-10-CM | POA: Diagnosis not present

## 2016-12-20 DIAGNOSIS — F419 Anxiety disorder, unspecified: Secondary | ICD-10-CM | POA: Diagnosis not present

## 2016-12-20 DIAGNOSIS — Z9181 History of falling: Secondary | ICD-10-CM | POA: Diagnosis not present

## 2016-12-20 DIAGNOSIS — I69354 Hemiplegia and hemiparesis following cerebral infarction affecting left non-dominant side: Secondary | ICD-10-CM | POA: Diagnosis not present

## 2016-12-22 DIAGNOSIS — I10 Essential (primary) hypertension: Secondary | ICD-10-CM | POA: Diagnosis not present

## 2016-12-22 DIAGNOSIS — I69354 Hemiplegia and hemiparesis following cerebral infarction affecting left non-dominant side: Secondary | ICD-10-CM | POA: Diagnosis not present

## 2016-12-22 DIAGNOSIS — Z9181 History of falling: Secondary | ICD-10-CM | POA: Diagnosis not present

## 2016-12-22 DIAGNOSIS — I2510 Atherosclerotic heart disease of native coronary artery without angina pectoris: Secondary | ICD-10-CM | POA: Diagnosis not present

## 2016-12-22 DIAGNOSIS — G8929 Other chronic pain: Secondary | ICD-10-CM | POA: Diagnosis not present

## 2016-12-22 DIAGNOSIS — F419 Anxiety disorder, unspecified: Secondary | ICD-10-CM | POA: Diagnosis not present

## 2016-12-24 DIAGNOSIS — I69354 Hemiplegia and hemiparesis following cerebral infarction affecting left non-dominant side: Secondary | ICD-10-CM | POA: Diagnosis not present

## 2016-12-24 DIAGNOSIS — I10 Essential (primary) hypertension: Secondary | ICD-10-CM | POA: Diagnosis not present

## 2016-12-24 DIAGNOSIS — F419 Anxiety disorder, unspecified: Secondary | ICD-10-CM | POA: Diagnosis not present

## 2016-12-24 DIAGNOSIS — G8929 Other chronic pain: Secondary | ICD-10-CM | POA: Diagnosis not present

## 2016-12-24 DIAGNOSIS — I2510 Atherosclerotic heart disease of native coronary artery without angina pectoris: Secondary | ICD-10-CM | POA: Diagnosis not present

## 2016-12-24 DIAGNOSIS — Z9181 History of falling: Secondary | ICD-10-CM | POA: Diagnosis not present

## 2016-12-26 ENCOUNTER — Other Ambulatory Visit: Payer: Self-pay | Admitting: Family Medicine

## 2017-01-04 DIAGNOSIS — I2510 Atherosclerotic heart disease of native coronary artery without angina pectoris: Secondary | ICD-10-CM | POA: Diagnosis not present

## 2017-01-04 DIAGNOSIS — F419 Anxiety disorder, unspecified: Secondary | ICD-10-CM | POA: Diagnosis not present

## 2017-01-04 DIAGNOSIS — Z9181 History of falling: Secondary | ICD-10-CM | POA: Diagnosis not present

## 2017-01-04 DIAGNOSIS — I10 Essential (primary) hypertension: Secondary | ICD-10-CM | POA: Diagnosis not present

## 2017-01-04 DIAGNOSIS — G8929 Other chronic pain: Secondary | ICD-10-CM | POA: Diagnosis not present

## 2017-01-04 DIAGNOSIS — I69354 Hemiplegia and hemiparesis following cerebral infarction affecting left non-dominant side: Secondary | ICD-10-CM | POA: Diagnosis not present

## 2017-01-06 ENCOUNTER — Encounter: Payer: Self-pay | Admitting: Orthopaedic Surgery

## 2017-01-06 ENCOUNTER — Ambulatory Visit (INDEPENDENT_AMBULATORY_CARE_PROVIDER_SITE_OTHER): Payer: Self-pay | Admitting: Orthopaedic Surgery

## 2017-01-06 ENCOUNTER — Ambulatory Visit (INDEPENDENT_AMBULATORY_CARE_PROVIDER_SITE_OTHER): Payer: Medicare Other

## 2017-01-06 DIAGNOSIS — S92355D Nondisplaced fracture of fifth metatarsal bone, left foot, subsequent encounter for fracture with routine healing: Secondary | ICD-10-CM

## 2017-01-06 NOTE — Progress Notes (Signed)
CC:  My foot does not hurt as much.  I am doing better  He still uses his CAM walker on the left.  He has much less pain or discomfort.  He has no new trauma.  He has left sided CVA with weakness.  Left foot OK.  No redness or swelling.  X-rays were done.  Fracture is not completely healed.  Encounter Diagnosis  Name Primary?  . Closed nondisplaced fracture of fifth metatarsal bone of left foot with routine healing, subsequent encounter Yes   Return in one month.  X-rays of the left foot then.  Continue CAM walker, call if any problem, precautions discussed.  Electronically Signed Sanjuana Kava, MD 4/26/20182:24 PM

## 2017-01-19 DIAGNOSIS — F419 Anxiety disorder, unspecified: Secondary | ICD-10-CM | POA: Diagnosis not present

## 2017-01-19 DIAGNOSIS — G8929 Other chronic pain: Secondary | ICD-10-CM | POA: Diagnosis not present

## 2017-01-19 DIAGNOSIS — I2510 Atherosclerotic heart disease of native coronary artery without angina pectoris: Secondary | ICD-10-CM | POA: Diagnosis not present

## 2017-01-19 DIAGNOSIS — Z9181 History of falling: Secondary | ICD-10-CM | POA: Diagnosis not present

## 2017-01-19 DIAGNOSIS — I69354 Hemiplegia and hemiparesis following cerebral infarction affecting left non-dominant side: Secondary | ICD-10-CM | POA: Diagnosis not present

## 2017-01-19 DIAGNOSIS — I10 Essential (primary) hypertension: Secondary | ICD-10-CM | POA: Diagnosis not present

## 2017-02-03 ENCOUNTER — Ambulatory Visit (INDEPENDENT_AMBULATORY_CARE_PROVIDER_SITE_OTHER): Payer: Medicare Other

## 2017-02-03 ENCOUNTER — Encounter: Payer: Self-pay | Admitting: Orthopaedic Surgery

## 2017-02-03 ENCOUNTER — Ambulatory Visit (INDEPENDENT_AMBULATORY_CARE_PROVIDER_SITE_OTHER): Payer: Self-pay | Admitting: Orthopaedic Surgery

## 2017-02-03 DIAGNOSIS — S92355D Nondisplaced fracture of fifth metatarsal bone, left foot, subsequent encounter for fracture with routine healing: Secondary | ICD-10-CM | POA: Diagnosis not present

## 2017-02-03 NOTE — Progress Notes (Signed)
CC:  My foot does not hurt  He is not wearing his CAM walker at home.  He has it today.  He has no new trauma.  He has hemiparesis on the left.  He has some edema of the left foot and ankle but this is chronic.  X-rays were done, reported separately.  Encounter Diagnosis  Name Primary?  . Closed nondisplaced fracture of fifth metatarsal bone of left foot with routine healing, subsequent encounter Yes    He has Rx for new brace.  He is to get it.  I will see him as needed.  Call if any problem.  Precautions discussed.  Electronically Signed Sanjuana Kava, MD 5/24/20182:51 PM

## 2017-03-01 DIAGNOSIS — L851 Acquired keratosis [keratoderma] palmaris et plantaris: Secondary | ICD-10-CM | POA: Diagnosis not present

## 2017-03-01 DIAGNOSIS — B351 Tinea unguium: Secondary | ICD-10-CM | POA: Diagnosis not present

## 2017-03-01 DIAGNOSIS — I739 Peripheral vascular disease, unspecified: Secondary | ICD-10-CM | POA: Diagnosis not present

## 2017-03-20 ENCOUNTER — Other Ambulatory Visit: Payer: Self-pay | Admitting: Family Medicine

## 2017-03-21 NOTE — Telephone Encounter (Signed)
Seen 3 26 18

## 2017-03-22 ENCOUNTER — Other Ambulatory Visit: Payer: Self-pay | Admitting: Family Medicine

## 2017-03-29 DIAGNOSIS — L97529 Non-pressure chronic ulcer of other part of left foot with unspecified severity: Secondary | ICD-10-CM | POA: Diagnosis not present

## 2017-04-07 ENCOUNTER — Ambulatory Visit: Payer: Medicare Other | Admitting: Family Medicine

## 2017-04-14 ENCOUNTER — Ambulatory Visit (INDEPENDENT_AMBULATORY_CARE_PROVIDER_SITE_OTHER): Payer: Medicare Other | Admitting: Family Medicine

## 2017-04-14 ENCOUNTER — Encounter: Payer: Self-pay | Admitting: Family Medicine

## 2017-04-14 VITALS — BP 144/80 | HR 78 | Resp 16 | Ht 69.0 in | Wt 284.0 lb

## 2017-04-14 DIAGNOSIS — R262 Difficulty in walking, not elsewhere classified: Secondary | ICD-10-CM

## 2017-04-14 DIAGNOSIS — I2510 Atherosclerotic heart disease of native coronary artery without angina pectoris: Secondary | ICD-10-CM

## 2017-04-14 DIAGNOSIS — E669 Obesity, unspecified: Secondary | ICD-10-CM

## 2017-04-14 DIAGNOSIS — R7303 Prediabetes: Secondary | ICD-10-CM

## 2017-04-14 DIAGNOSIS — E782 Mixed hyperlipidemia: Secondary | ICD-10-CM

## 2017-04-14 DIAGNOSIS — IMO0001 Reserved for inherently not codable concepts without codable children: Secondary | ICD-10-CM

## 2017-04-14 DIAGNOSIS — Z1211 Encounter for screening for malignant neoplasm of colon: Secondary | ICD-10-CM

## 2017-04-14 DIAGNOSIS — I10 Essential (primary) hypertension: Secondary | ICD-10-CM

## 2017-04-14 DIAGNOSIS — E559 Vitamin D deficiency, unspecified: Secondary | ICD-10-CM | POA: Diagnosis not present

## 2017-04-14 DIAGNOSIS — Z125 Encounter for screening for malignant neoplasm of prostate: Secondary | ICD-10-CM | POA: Diagnosis not present

## 2017-04-14 LAB — POC HEMOCCULT BLD/STL (OFFICE/1-CARD/DIAGNOSTIC): FECAL OCCULT BLD: NEGATIVE

## 2017-04-14 NOTE — Patient Instructions (Addendum)
Wellness in October  MD follow up mid January  Rectal today  Fasting lipid, cmp and EGFr, TSH, PSA, VitD, HBA1C as soon as possible  Please make and keep  appt with Dr Domenic Polite Carefull not to fall  No more triplets!!!!  Thank you  for choosing [facility]. We consider it a privelige to serve you.  Delivering excellent health care in a caring and  compassionate way is our goal.  Partnering with you,  so that together we can achieve this goal is our strategy.

## 2017-04-17 DIAGNOSIS — Z1211 Encounter for screening for malignant neoplasm of colon: Secondary | ICD-10-CM | POA: Insufficient documentation

## 2017-04-17 NOTE — Assessment & Plan Note (Signed)
Rectal exam: heme negative stool and no mass palpable

## 2017-04-17 NOTE — Assessment & Plan Note (Signed)
Asymptomatic currently but needs to maintain cardiology followup

## 2017-04-17 NOTE — Assessment & Plan Note (Signed)
Adequate though sub optimal control, no med change DASH diet and commitment to daily physical activity for a minimum of 30 minutes discussed and encouraged, as a part of hypertension management. The importance of attaining a healthy weight is also discussed.  BP/Weight 04/14/2017 12/06/2016 11/25/2016 11/24/2016 11/24/2016 11/19/2016 65/53/7482  Systolic BP 707 867 544 - 920 100 712  Diastolic BP 80 70 55 - 63 57 78  Wt. (Lbs) 284 282 282 - - 282 287  BMI 41.94 41.64 - 41.64 - 41.64 42.38

## 2017-04-17 NOTE — Progress Notes (Signed)
Craig Fields     MRN: [id number]      DOB: 10-10-1940   HPI Craig Fields is here for follow up and re-evaluation of chronic medical conditions, medication management and review of any available recent lab and radiology data.  Preventive health is updated, specifically  Cancer screening and Immunization.   Left Foot fracture is healed fully and he can ambulate freely The PT denies any adverse reactions to current medications since the last visit.  There are no new concerns.  There are no specific complaints   ROS Denies recent fever or chills. Denies sinus pressure, nasal congestion, ear pain or sore throat. Denies chest congestion, productive cough or wheezing. Denies chest pains, palpitations and leg swelling Denies abdominal pain, nausea, vomiting,diarrhea or constipation.   Denies dysuria, frequency, hesitancy or incontinence. C/o chronic  limitation in mobility. Denies headaches, seizures, numbness, or tingling. Denies depression, anxiety or insomnia. Denies skin break down or rash.   PE  BP (!) 144/80   Pulse 78   Resp 16   Ht 5\' 9"  (1.753 m)   Wt 284 lb (128.8 kg)   SpO2 96%   BMI 41.94 kg/m   Patient alert and oriented and in no cardiopulmonary distress.  HEENT: No facial asymmetry, EOMI,   oropharynx pink and moist.  Neck supple no JVD, no mass.  Chest: Clear to auscultation bilaterally.  CVS: S1, S2 no murmurs, no S3.Regular rate.  ABD: Soft non tender.  Rectal: no mass, heme negative stool Ext: No edema  [id number]  ROM spine, shoulders, hips and knees.  Skin: Intact, no ulcerations or rash noted.  Psych: Good eye contact, normal affect. Memory intact not anxious or depressed appearing.  CNS: CN 2-12 intact, left hemiparesis from prior CVA  Assessment & Plan  Essential hypertension, benign Adequate though sub optimal control, no med change DASH diet and commitment to daily physical activity for a minimum of 30 minutes discussed and encouraged, as  a part of hypertension management. The importance of attaining a healthy weight is also discussed.  BP/Weight 04/14/2017 12/06/2016 11/25/2016 11/24/2016 11/24/2016 11/19/2016 76/73/4193  Systolic BP 790 240 973 - 532 992 426  Diastolic BP 80 70 55 - 63 57 78  Wt. (Lbs) 284 282 282 - - 282 287  BMI 41.94 41.64 - 41.64 - 41.64 42.38       Difficulty walking Improved and back to baseline with healing of foot fracture  Coronary atherosclerosis of native coronary artery Asymptomatic currently but needs to maintain cardiology followup  Mixed hyperlipidemia Updated lab needed at/ before next visit. Hyperlipidemia:Low fat diet discussed and encouraged.   Lipid Panel  Lab Results  Component Value Date   CHOL 148 11/19/2015   HDL 45 11/19/2015   LDLCALC 77 11/19/2015   TRIG 132 11/19/2015   CHOLHDL 3.3 11/19/2015       Obesity, Class II, BMI 35.0-39.9, with comorbidity (see actual BMI) Deteriorated. Patient re-educated about  the importance of commitment to a  minimum of 150 minutes of exercise per week.  The importance of healthy food choices with portion control discussed. Encouraged to start a food diary, count calories and to consider  joining a support group. Sample diet sheets offered. Goals set by the patient for the next several months.   Weight /BMI 04/14/2017 12/06/2016 11/25/2016  WEIGHT 284 lb 282 lb 282 lb  HEIGHT 5\' 9"  5\' 9"  5\' 9"   BMI 41.94 kg/m2 41.64 kg/m2 -      Prediabetes Updated  lab needed at/ before next visit. Patient educated about the importance of limiting  Carbohydrate intake , the need to commit to daily physical activity for a minimum of 30 minutes , and to commit weight loss. The fact that changes in all these areas will reduce or eliminate all together the development of diabetes is stressed.   Diabetic Labs Latest Ref Rng & Units 11/25/2016 06/10/2016 11/19/2015 06/02/2015 08/15/2014  HbA1c <5.7 % - 5.9(H) 6.4(H) 6.4(H) 6.2(H)  Chol 125 - 200 mg/dL - -  148 157 -  HDL >=40 mg/dL - - 45 43 -  Calc LDL <130 mg/dL - - 77 85 -  Triglycerides <150 mg/dL - - 132 147 -  Creatinine 0.61 - 1.24 mg/dL 1.16 1.02 0.99 1.03 0.95   BP/Weight 04/14/2017 12/06/2016 11/25/2016 11/24/2016 11/24/2016 11/19/2016 93/81/8299  Systolic BP 371 696 789 - 381 017 510  Diastolic BP 80 70 55 - 63 57 78  Wt. (Lbs) 284 282 282 - - 282 287  BMI 41.94 41.64 - 41.64 - 41.64 42.38   No flowsheet data found.    Screening for colon cancer Rectal exam: heme negative stool and no mass palpable

## 2017-04-17 NOTE — Assessment & Plan Note (Signed)
Deteriorated. Patient re-educated about  the importance of commitment to a  minimum of 150 minutes of exercise per week.  The importance of healthy food choices with portion control discussed. Encouraged to start a food diary, count calories and to consider  joining a support group. Sample diet sheets offered. Goals set by the patient for the next several months.   Weight /BMI 04/14/2017 12/06/2016 11/25/2016  WEIGHT 284 lb 282 lb 282 lb  HEIGHT 5\' 9"  5\' 9"  5\' 9"   BMI 41.94 kg/m2 41.64 kg/m2 -

## 2017-04-17 NOTE — Assessment & Plan Note (Signed)
Improved and back to baseline with healing of foot fracture

## 2017-04-17 NOTE — Assessment & Plan Note (Signed)
Updated lab needed at/ before next visit. Hyperlipidemia:Low fat diet discussed and encouraged.   Lipid Panel  Lab Results  Component Value Date   CHOL 148 11/19/2015   HDL 45 11/19/2015   LDLCALC 77 11/19/2015   TRIG 132 11/19/2015   CHOLHDL 3.3 11/19/2015

## 2017-04-17 NOTE — Assessment & Plan Note (Signed)
Updated lab needed at/ before next visit. Patient educated about the importance of limiting  Carbohydrate intake , the need to commit to daily physical activity for a minimum of 30 minutes , and to commit weight loss. The fact that changes in all these areas will reduce or eliminate all together the development of diabetes is stressed.   Diabetic Labs Latest Ref Rng & Units 11/25/2016 06/10/2016 11/19/2015 06/02/2015 08/15/2014  HbA1c <5.7 % - 5.9(H) 6.4(H) 6.4(H) 6.2(H)  Chol 125 - 200 mg/dL - - 148 157 -  HDL >=40 mg/dL - - 45 43 -  Calc LDL <130 mg/dL - - 77 85 -  Triglycerides <150 mg/dL - - 132 147 -  Creatinine 0.61 - 1.24 mg/dL 1.16 1.02 0.99 1.03 0.95   BP/Weight 04/14/2017 12/06/2016 11/25/2016 11/24/2016 11/24/2016 11/19/2016 07/13/5944  Systolic BP 859 292 446 - 286 381 771  Diastolic BP 80 70 55 - 63 57 78  Wt. (Lbs) 284 282 282 - - 282 287  BMI 41.94 41.64 - 41.64 - 41.64 42.38   No flowsheet data found.

## 2017-04-19 DIAGNOSIS — I10 Essential (primary) hypertension: Secondary | ICD-10-CM | POA: Diagnosis not present

## 2017-04-19 DIAGNOSIS — E782 Mixed hyperlipidemia: Secondary | ICD-10-CM | POA: Diagnosis not present

## 2017-04-19 DIAGNOSIS — R7303 Prediabetes: Secondary | ICD-10-CM | POA: Diagnosis not present

## 2017-04-19 DIAGNOSIS — Z125 Encounter for screening for malignant neoplasm of prostate: Secondary | ICD-10-CM | POA: Diagnosis not present

## 2017-04-19 DIAGNOSIS — E559 Vitamin D deficiency, unspecified: Secondary | ICD-10-CM | POA: Diagnosis not present

## 2017-04-19 LAB — LIPID PANEL
CHOL/HDL RATIO: 3.8 ratio (ref <5.0)
CHOLESTEROL: 154 mg/dL (ref <200)
HDL: 41 mg/dL (ref >40)
LDL Cholesterol: 75 mg/dL (ref <100)
Triglycerides: 192 mg/dL — ABNORMAL HIGH (ref <150)
VLDL: 38 mg/dL — ABNORMAL HIGH (ref <30)

## 2017-04-19 LAB — COMPLETE METABOLIC PANEL WITH GFR
ALBUMIN: 4.1 g/dL (ref 3.6–5.1)
ALK PHOS: 34 U/L — AB (ref 40–115)
ALT: 18 U/L (ref 9–46)
AST: 20 U/L (ref 10–35)
BILIRUBIN TOTAL: 0.6 mg/dL (ref 0.2–1.2)
BUN: 18 mg/dL (ref 7–25)
CALCIUM: 9.5 mg/dL (ref 8.6–10.3)
CO2: 26 mmol/L (ref 20–32)
CREATININE: 1.02 mg/dL (ref 0.70–1.18)
Chloride: 102 mmol/L (ref 98–110)
GFR, Est African American: 82 mL/min (ref >=60)
GFR, Est Non African American: 71 mL/min (ref >=60)
Glucose, Bld: 116 mg/dL — ABNORMAL HIGH (ref 65–99)
POTASSIUM: 4.2 mmol/L (ref 3.5–5.3)
Sodium: 140 mmol/L (ref 135–146)
TOTAL PROTEIN: 6.8 g/dL (ref 6.1–8.1)

## 2017-04-19 LAB — TSH: TSH: 3.05 m[IU]/L (ref 0.40–4.50)

## 2017-04-20 LAB — HEMOGLOBIN A1C
HEMOGLOBIN A1C: 6 % — AB (ref <5.7)
Mean Plasma Glucose: 126 mg/dL

## 2017-04-20 LAB — VITAMIN D 25 HYDROXY (VIT D DEFICIENCY, FRACTURES): Vit D, 25-Hydroxy: 26 ng/mL — ABNORMAL LOW (ref 30–100)

## 2017-04-20 LAB — PSA: PSA: 2.8 ng/mL (ref <=4.0)

## 2017-04-21 ENCOUNTER — Encounter: Payer: Self-pay | Admitting: Family Medicine

## 2017-04-27 ENCOUNTER — Telehealth: Payer: Self-pay

## 2017-04-27 MED ORDER — UNABLE TO FIND: 99 refills | Status: DC

## 2017-04-27 NOTE — Telephone Encounter (Signed)
Order sent in 

## 2017-04-27 NOTE — Telephone Encounter (Signed)
Needs rx faxed to [facility] to "Replace velcro strap on left lower leg brace and a new left shoe"  Fax [phone]

## 2017-04-27 NOTE — Telephone Encounter (Signed)
pls fax order I will co sign

## 2017-05-10 DIAGNOSIS — I739 Peripheral vascular disease, unspecified: Secondary | ICD-10-CM | POA: Diagnosis not present

## 2017-05-10 DIAGNOSIS — L851 Acquired keratosis [keratoderma] palmaris et plantaris: Secondary | ICD-10-CM | POA: Diagnosis not present

## 2017-05-10 DIAGNOSIS — B351 Tinea unguium: Secondary | ICD-10-CM | POA: Diagnosis not present

## 2017-05-30 ENCOUNTER — Other Ambulatory Visit: Payer: Self-pay | Admitting: Family Medicine

## 2017-05-30 NOTE — Telephone Encounter (Signed)
Seen 8 2 18 

## 2017-06-03 ENCOUNTER — Ambulatory Visit (HOSPITAL_COMMUNITY)
Admission: RE | Admit: 2017-06-03 | Discharge: 2017-06-03 | Disposition: A | Discharge status: Home / Self Care | Payer: Medicare Other | Source: Ambulatory Visit | Attending: Cardiology | Admitting: Cardiology

## 2017-06-03 DIAGNOSIS — I10 Essential (primary) hypertension: Secondary | ICD-10-CM | POA: Insufficient documentation

## 2017-06-03 DIAGNOSIS — Z87891 Personal history of nicotine dependence: Secondary | ICD-10-CM | POA: Insufficient documentation

## 2017-06-03 DIAGNOSIS — Z8673 Personal history of transient ischemic attack (TIA), and cerebral infarction without residual deficits: Secondary | ICD-10-CM | POA: Diagnosis not present

## 2017-06-03 DIAGNOSIS — G8190 Hemiplegia, unspecified affecting unspecified side: Secondary | ICD-10-CM | POA: Diagnosis not present

## 2017-06-03 DIAGNOSIS — E785 Hyperlipidemia, unspecified: Secondary | ICD-10-CM | POA: Insufficient documentation

## 2017-06-03 DIAGNOSIS — I350 Nonrheumatic aortic (valve) stenosis: Secondary | ICD-10-CM | POA: Insufficient documentation

## 2017-06-03 NOTE — Progress Notes (Signed)
*  PRELIMINARY RESULTS* Echocardiogram 2D Echocardiogram has been performed.  Leavy Cella 06/03/2017, 12:47 PM

## 2017-06-10 ENCOUNTER — Telehealth: Payer: Self-pay

## 2017-06-10 NOTE — Telephone Encounter (Signed)
Called pt to reschedule awv appointment.    Craig Fields, B.A.  Care Guide [phone]

## 2017-06-13 ENCOUNTER — Ambulatory Visit (INDEPENDENT_AMBULATORY_CARE_PROVIDER_SITE_OTHER): Payer: Medicare Other

## 2017-06-13 ENCOUNTER — Ambulatory Visit: Payer: Medicare Other

## 2017-06-13 VITALS — BP 148/82 | HR 74 | Temp 98.0°F | Ht 69.0 in | Wt 280.0 lb

## 2017-06-13 DIAGNOSIS — Z23 Encounter for immunization: Secondary | ICD-10-CM

## 2017-06-13 DIAGNOSIS — Z0000 Encounter for general adult medical examination without abnormal findings: Secondary | ICD-10-CM | POA: Diagnosis not present

## 2017-06-13 NOTE — Patient Instructions (Addendum)
[name] , Thank you for taking time to come for your Medicare Wellness Visit. I appreciate your ongoing commitment to your health goals. Please review the following plan we discussed and let me know if I can assist you in the future.   Screening recommendations/referrals: Colonoscopy: Up to date, next due 07/2020 Recommended yearly ophthalmology/optometry visit for glaucoma screening and checkup Recommended yearly dental visit for hygiene and checkup  Vaccinations: Influenza vaccine: Administered today Pneumococcal vaccine: Up to date Tdap vaccine: Up to date, next due 08/2024 Shingles vaccine: Complete    Advanced directives: Advance directive discussed with you today. I have provided a copy for you to complete at home and have notarized. Once this is complete please bring a copy in to our office so we can scan it into your chart.  Conditions/risks identified: Obese, recommend starting a routine exercise program at least 3 days a week for 30-45 minutes at a time as tolerated.   Next appointment: Follow up with Dr. Moshe Fields on 09/22/2016 at 2:20 pm. Follow up in 1 year for your annual wellness visit.   Preventive Care 60 Years and Older, Male Preventive care refers to lifestyle choices and visits with your health care provider that can promote health and wellness. What does preventive care include?  A yearly physical exam. This is also called an annual well check.  Dental exams once or twice a year.  Routine eye exams. Ask your health care provider how often you should have your eyes checked.  Personal lifestyle choices, including:  Daily care of your teeth and gums.  Regular physical activity.  Eating a healthy diet.  Avoiding tobacco and drug use.  Limiting alcohol use.  Practicing safe sex.  Taking low doses of aspirin every day.  Taking vitamin and mineral supplements as recommended by your health care provider. What happens during an annual well check? The  services and screenings done by your health care provider during your annual well check will depend on your age, overall health, lifestyle risk factors, and family history of disease. Counseling  Your health care provider may ask you questions about your:  Alcohol use.  Tobacco use.  Drug use.  Emotional well-being.  Home and relationship well-being.  Sexual activity.  Eating habits.  History of falls.  Memory and ability to understand (cognition).  Work and work Statistician. Screening  You may have the following tests or measurements:  Height, weight, and BMI.  Blood pressure.  Lipid and cholesterol levels. These may be checked every 5 years, or more frequently if you are over 42 years old.  Skin check.  Lung cancer screening. You may have this screening every year starting at age 65 if you have a 30-pack-year history of smoking and currently smoke or have quit within the past 15 years.  Fecal occult blood test (FOBT) of the stool. You may have this test every year starting at age 4.  Flexible sigmoidoscopy or colonoscopy. You may have a sigmoidoscopy every 5 years or a colonoscopy every 10 years starting at age 59.  Prostate cancer screening. Recommendations will vary depending on your family history and other risks.  Hepatitis C blood test.  Hepatitis B blood test.  Sexually transmitted disease (STD) testing.  Diabetes screening. This is done by checking your blood sugar (glucose) after you have not eaten for a while (fasting). You may have this done every 1-3 years.  Abdominal aortic aneurysm (AAA) screening. You may need this if you are a current or former  smoker.  Osteoporosis. You may be screened starting at age 12 if you are at high risk. Talk with your health care provider about your test results, treatment options, and if necessary, the need for more tests. Vaccines  Your health care provider may recommend certain vaccines, such as:  Influenza  vaccine. This is recommended every year.  Tetanus, diphtheria, and acellular pertussis (Tdap, Td) vaccine. You may need a Td booster every 10 years.  Zoster vaccine. You may need this after age 64.  Pneumococcal 13-valent conjugate (PCV13) vaccine. One dose is recommended after age 80.  Pneumococcal polysaccharide (PPSV23) vaccine. One dose is recommended after age 51. Talk to your health care provider about which screenings and vaccines you need and how often you need them. This information is not intended to replace advice given to you by your health care provider. Make sure you discuss any questions you have with your health care provider. Document Released: 09/26/2015 Document Revised: 05/19/2016 Document Reviewed: 07/01/2015 Elsevier Interactive Patient Education  2017 [place] Prevention in the Home Falls can cause injuries. They can happen to people of all ages. There are many things you can do to make your home safe and to help prevent falls. What can I do on the outside of my home?  Regularly fix the edges of walkways and driveways and fix any cracks.  Remove anything that might make you trip as you walk through a door, such as a raised step or threshold.  Trim any bushes or trees on the path to your home.  Use bright outdoor lighting.  Clear any walking paths of anything that might make someone trip, such as rocks or tools.  Regularly check to see if handrails are loose or broken. Make sure that both sides of any steps have handrails.  Any raised decks and porches should have guardrails on the edges.  Have any leaves, snow, or ice cleared regularly.  Use sand or salt on walking paths during winter.  Clean up any spills in your garage right away. This includes oil or grease spills. What can I do in the bathroom?  Use night lights.  Install grab bars by the toilet and in the tub and shower. Do not use towel bars as grab bars.  Use non-skid mats or decals  in the tub or shower.  If you need to sit down in the shower, use a plastic, non-slip stool.  Keep the floor dry. Clean up any water that spills on the floor as soon as it happens.  Remove soap buildup in the tub or shower regularly.  Attach bath mats securely with double-sided non-slip rug tape.  Do not have throw rugs and other things on the floor that can make you trip. What can I do in the bedroom?  Use night lights.  Make sure that you have a light by your bed that is easy to reach.  Do not use any sheets or blankets that are too big for your bed. They should not hang down onto the floor.  Have a firm chair that has side arms. You can use this for support while you get dressed.  Do not have throw rugs and other things on the floor that can make you trip. What can I do in the kitchen?  Clean up any spills right away.  Avoid walking on wet floors.  Keep items that you use a lot in easy-to-reach places.  If you need to reach something above you, use a  strong step stool that has a grab bar.  Keep electrical cords out of the way.  Do not use floor polish or wax that makes floors slippery. If you must use wax, use non-skid floor wax.  Do not have throw rugs and other things on the floor that can make you trip. What can I do with my stairs?  Do not leave any items on the stairs.  Make sure that there are handrails on both sides of the stairs and use them. Fix handrails that are broken or loose. Make sure that handrails are as long as the stairways.  Check any carpeting to make sure that it is firmly attached to the stairs. Fix any carpet that is loose or worn.  Avoid having throw rugs at the top or bottom of the stairs. If you do have throw rugs, attach them to the floor with carpet tape.  Make sure that you have a light switch at the top of the stairs and the bottom of the stairs. If you do not have them, ask someone to add them for you. What else can I do to help  prevent falls?  Wear shoes that:  Do not have high heels.  Have rubber bottoms.  Are comfortable and fit you well.  Are closed at the toe. Do not wear sandals.  If you use a stepladder:  Make sure that it is fully opened. Do not climb a closed stepladder.  Make sure that both sides of the stepladder are locked into place.  Ask someone to hold it for you, if possible.  Clearly mark and make sure that you can see:  Any grab bars or handrails.  First and last steps.  Where the edge of each step is.  Use tools that help you move around (mobility aids) if they are needed. These include:  Canes.  Walkers.  Scooters.  Crutches.  Turn on the lights when you go into a dark area. Replace any light bulbs as soon as they burn out.  Set up your furniture so you have a clear path. Avoid moving your furniture around.  If any of your floors are uneven, fix them.  If there are any pets around you, be aware of where they are.  Review your medicines with your doctor. Some medicines can make you feel dizzy. This can increase your chance of falling. Ask your doctor what other things that you can do to help prevent falls. This information is not intended to replace advice given to you by your health care provider. Make sure you discuss any questions you have with your health care provider. Document Released: 06/26/2009 Document Revised: 02/05/2016 Document Reviewed: 10/04/2014 Elsevier Interactive Patient Education  2017 [employer].

## 2017-06-13 NOTE — Progress Notes (Signed)
Subjective:   Craig Fields is a 76 y.o. male who presents for Medicare Annual/Subsequent preventive examination.  Review of Systems:  Cardiac Risk Factors include: advanced age (>4men, >32 women);hypertension;dyslipidemia;male gender;obesity (BMI >30kg/m2);sedentary lifestyle     Objective:    Vitals: BP (!) 148/82   Pulse 74   Temp 98 F (36.7 C) (Oral)   Ht 5\' 9"  (1.753 m)   Wt 280 lb 0.6 oz (127 kg)   BMI 41.35 kg/m   Body mass index is 41.35 kg/m.  Tobacco History  Smoking Status  . Former Smoker  . Packs/day: 0.50  . Years: 5.00  . Types: Cigarettes  . Quit date: 12/27/1980  Smokeless Tobacco  . Former Systems developer  . Types: Chew  . Quit date: 01/24/2013     Counseling given: Not Answered   Past Medical History:  Diagnosis Date  . Anxiety disorder   . Chronic back pain   . Colon cancer [facility]) May 2007  . Coronary atherosclerosis of native coronary artery    Multivessel status post CABG  . 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 Essential hypertension, benign   . Hyperlipidemia   . IGT (impaired glucose tolerance)   . Obesity   . OSA (obstructive sleep apnea)   . Seizures ([place])    Age 51 or 3, no meds and no reoccurances   Past Surgical History:  Procedure Laterality Date  . COLONOSCOPY N/A 07/15/2015   Procedure: COLONOSCOPY;  Surgeon: Aviva Signs, MD;  Location: [facility];  Service: Gastroenterology;  Laterality: N/A;  . CORONARY ARTERY BYPASS GRAFT  2004   x4  . INCISIONAL HERNIA REPAIR N/A 01/01/2013   Procedure: HERNIA REPAIR INCISIONAL;  Surgeon: Jamesetta So, MD;  Location: [facility] ORS;  Service: General;  Laterality: N/A;  umbilical area  . INSERTION OF MESH N/A 01/01/2013   Procedure: INSERTION OF MESH;  Surgeon: Jamesetta So, MD;  Location: [facility] OR[facility];  Service: General;  Laterality: N/A;  . Left inguinal  hernia repair  2005  . [name]  2007   Family History  Problem Relation Age of Onset  . Pancreatic cancer Mother   .  Heart disease Father   . Prostate cancer Father   . Stroke Brother   . Heart disease Brother   . Heart disease Brother   . COPD Sister   . Actinic keratosis Sister   . Obesity Son   . Anxiety disorder Son    History  Sexual Activity  . Sexual activity: Not Currently  . Birth control/ protection: None    Outpatient Encounter Prescriptions as of 06/13/2017  Medication Sig  . acetaminophen (TYLENOL) 500 MG tablet Take 1,000 mg by mouth every 6 (six) hours as needed for moderate pain.   Marland Kitchen aspirin (ASPIRIN LOW DOSE) 81 MG EC tablet Take 81 mg by mouth every evening.   . Cholecalciferol (VITAMIN D) 2000 units CAPS Take 1 capsule by mouth daily.  . clopidogrel (PLAVIX) 75 MG tablet TAKE 1 TABLET (75 MG TOTAL) BY MOUTH DAILY.  . clotrimazole-betamethasone (LOTRISONE) cream APPLY TO AFFECTED AREA TWICE A DAY (Patient taking differently: APPLY TO AFFECTED AREA TWICE A DAY PRN)  . Coenzyme Q10 (CO Q-10) 200 MG CAPS Take 1 tablet by mouth daily.   Marland Kitchen KLOR-CON M20 20 MEQ tablet TAKE 2 TABLETS (40 MEQ TOTAL) BY MOUTH DAILY.  Marland Kitchen lisinopril (PRINIVIL,ZESTRIL) 20 MG tablet TAKE 1/2 TABLET BY MOUTH DAILY.  . metoprolol tartrate (  LOPRESSOR) 50 MG tablet TAKE 1/2 TABLET BY MOUTH 2 (TWO) TIMES DAILY.  . Multiple Vitamins-Minerals ([employer] SPECTRAVITE) TABS Take 1 tablet by mouth daily.   . Omega-3 Fatty Acids (FISH OIL) 1000 MG CAPS Take 2 capsules by mouth at bedtime. 1200 mg  . polyethylene glycol powder (GLYCOLAX/MIRALAX) powder MIX TO TAKE 17 G DAILY AS DIRECTED  . simvastatin (ZOCOR) 20 MG tablet TAKE 1 TABLET BY MOUTH AT BEDTIME  . sodium chloride (OCEAN) 0.65 % SOLN nasal spray Place 1 spray into both nostrils as needed for congestion.  . triamterene-hydrochlorothiazide (MAXZIDE-25) 37.5-25 MG tablet TAKE 1 TABLET BY MOUTH EVERY MORNING (Patient taking differently: TAKE 1 TABLET BY MOUTH AT BEDTIME)  . 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     Activities of Daily Living In your present state of health, do you have any difficulty performing the following activities: 06/13/2017  Hearing? N  Vision? Y  Comment Recommend follow up with Dr. Gershon Crane  Difficulty concentrating or making decisions? N  Walking or climbing stairs? Y  Dressing or bathing? Y  Doing errands, shopping? Y  Preparing Food and eating ? Y  Using the Toilet? N  In the past six months, have you accidently leaked urine? Y  Do you have problems with loss of bowel control? N  Managing your Medications? N  Managing your Finances? N  Housekeeping or managing your Housekeeping? Y  Some recent data might be hidden    Patient Care Team: Fayrene Helper, MD as PCP - General Caprice Beaver, DPM as Consulting Physician (Podiatry) Satira Sark, MD as Consulting Physician (Cardiology) Aviva Signs, MD as Consulting Physician (General Surgery)   Assessment:    Exercise Activities and Dietary recommendations Current Exercise Habits: The patient does not participate in regular exercise at present, Exercise limited by: neurologic condition(s)  Goals    . Exercise 3x per week (30 min per time)          Recommend starting a routine exercise program at least 3 days a week for 30-45 minutes at a time as tolerated.       Fall Risk Fall Risk  06/13/2017 06/10/2016 09/09/2015 06/02/2015 12/05/2013  Falls in the past year? Yes No Yes Yes Yes  Number falls in past yr: 1 - - 1 1  Injury with Fall? Yes - - No Yes  Comment - - - - bruising   Risk Factor Category  High Fall Risk - - - High Fall Risk  Risk for fall due to : History of fall(s);Impaired balance/gait;Impaired mobility - - - -  Follow up Education provided;Falls prevention discussed;Falls evaluation completed - - - -   Depression Screen PHQ 2/9 Scores 06/13/2017 06/10/2016 12/05/2013  PHQ - 2 Score 0 0 1    Cognitive Function: Normal   6CIT Screen  06/13/2017  What Year? 0 points  What month? 0 points  What time? 0 points  Count back from 20 0 points  Months in reverse 0 points  Repeat phrase 0 points  Total Score 0    Immunization History  Administered Date(s) Administered  . H1N1 08/27/2008, 10/16/2008  . Influenza Split 07/06/2011, 06/07/2012  . Influenza Whole 06/14/2007, 06/24/2009, 06/30/2010  . Influenza,inj,Quad PF,6+ Mos 06/18/2013, 06/18/2014, 06/02/2015, 06/10/2016, 06/13/2017  . Pneumococcal Conjugate-13 04/10/2014  . Pneumococcal Polysaccharide-23 01/04/2007  . Td 04/15/2004  . Tdap 08/15/2014  . Zoster 01/10/2007   Screening  Tests Health Maintenance  Topic Date Due  . COLONOSCOPY  07/14/2020  . TETANUS/TDAP  08/15/2024  . INFLUENZA VACCINE  Completed  . PNA vac Low Risk Adult  Completed      Plan:   I have personally reviewed and noted the following in the patient's chart:   . Medical and social history . Use of alcohol, tobacco or illicit drugs  . Current medications and supplements . Functional ability and status . Nutritional status . Physical activity . Advanced directives . List of other physicians . Hospitalizations, surgeries, and ER visits in previous 12 months . Vitals . Screenings to include cognitive, depression, and falls . Referrals and appointments  In addition, I have reviewed and discussed with patient certain preventive protocols, quality metrics, and best practice recommendations. A written personalized care plan for preventive services as well as general preventive health recommendations were provided to patient.     Stormy Fabian, LPN  60/02/44

## 2017-06-14 NOTE — Progress Notes (Signed)
Cardiology Office Note  Date: 06/15/2017   ID: Craig Fields 12-17-1940, MRN [id number]  PCP: Craig Helper, MD  Primary Cardiologist: Craig Lesches, MD   Chief Complaint  Patient presents with  . Coronary Artery Disease  . Aortic Stenosis    History of Present Illness: Craig Fields is a 76 y.o. male last seen in November 2017. He presents today with his wife for a routine follow-up visit. He reports no angina symptoms, no palpitations or syncope. No change in chronic dyspnea on exertion. He has had no orthopnea or PND. Weight has been stable.  Follow-up echocardiogram is outlined below. He has evidence overall moderate aortic stenosis. We went over the results today and will plan a follow-up echocardiogram in one year.  He is 5 years out from his last ischemic evaluation. We have discussed follow-up stress testing, although he does not report angina and prefers medical therapy with observation for now. I reviewed his interval ECG in March.  We went over his medications. Current cardiac regimen includes aspirin, Plavix, lisinopril, Maxide, potassium supplements, Lopressor, and Zocor. LDL in August was 75.  Past Medical History:  Diagnosis Date  . Anxiety disorder   . Chronic back pain   . Colon cancer [facility]) May 2007  . Coronary atherosclerosis of native coronary artery    Multivessel status post CABG  . 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 Essential hypertension, benign   . Hyperlipidemia   . IGT (impaired glucose tolerance)   . Obesity   . OSA (obstructive sleep apnea)   . Seizures ([place])    Age 84 or 3, no meds and no reoccurances    Past Surgical History:  Procedure Laterality Date  . COLONOSCOPY N/A 07/15/2015   Procedure: COLONOSCOPY;  Surgeon: Craig Signs, MD;  Location: [facility] ENDO SUITE;  Service: Gastroenterology;  Laterality: N/A;  . CORONARY ARTERY BYPASS GRAFT  2004   x4  . INCISIONAL HERNIA REPAIR N/A 01/01/2013   Procedure:  HERNIA REPAIR INCISIONAL;  Surgeon: Craig So, MD;  Location: [facility] ORS;  Service: General;  Laterality: N/A;  umbilical area  . INSERTION OF MESH N/A 01/01/2013   Procedure: INSERTION OF MESH;  Surgeon: Craig So, MD;  Location: [facility] ORS;  Service: General;  Laterality: N/A;  . Left inguinal  hernia repair  2005  . Craig Fields  2007    Current Outpatient Prescriptions  Medication Sig Dispense Refill  . Acetaminophen (TYLENOL 8 HOUR ARTHRITIS PAIN PO) Take by mouth.    Marland Kitchen aspirin (ASPIRIN LOW DOSE) 81 MG EC tablet Take 81 mg by mouth every evening.     . Cholecalciferol (VITAMIN D) 2000 units CAPS Take 1 capsule by mouth daily.    . clopidogrel (PLAVIX) 75 MG tablet TAKE 1 TABLET (75 MG TOTAL) BY MOUTH DAILY. 90 tablet 1  . clotrimazole-betamethasone (LOTRISONE) cream APPLY TO AFFECTED AREA TWICE A DAY (Patient taking differently: APPLY TO AFFECTED AREA TWICE A DAY PRN) 45 g 1  . Coenzyme Q10 (CO Q-10) 200 MG CAPS Take 1 tablet by mouth daily.     Marland Kitchen KLOR-CON M20 20 MEQ tablet TAKE 2 TABLETS (40 MEQ TOTAL) BY MOUTH DAILY. 180 tablet 1  . lisinopril (PRINIVIL,ZESTRIL) 20 MG tablet TAKE 1/2 TABLET BY MOUTH DAILY. 30 tablet 4  . metoprolol tartrate (LOPRESSOR) 50 MG tablet TAKE 1/2 TABLET BY MOUTH 2 (TWO) TIMES DAILY. 90 tablet 1  . Multiple Vitamins-Minerals ([employer] SPECTRAVITE)  TABS Take 1 tablet by mouth daily.     . Omega-3 Fatty Acids (FISH OIL) 1000 MG CAPS Take 2 capsules by mouth at bedtime. 1200 mg    . polyethylene glycol powder (GLYCOLAX/MIRALAX) powder MIX TO TAKE 17 G DAILY AS DIRECTED 527 g 1  . simvastatin (ZOCOR) 20 MG tablet TAKE 1 TABLET BY MOUTH AT BEDTIME 90 tablet 1  . sodium chloride (OCEAN) 0.65 % SOLN nasal spray Place 1 spray into both nostrils as needed for congestion.    . triamterene-hydrochlorothiazide (MAXZIDE-25) 37.5-25 MG tablet TAKE 1 TABLET BY MOUTH EVERY MORNING (Patient taking differently: TAKE 1 TABLET BY MOUTH AT BEDTIME) 90 tablet 1  . UNABLE TO FIND  Bilateral shoes and calliber plate (left) Dx D17.616 1 each PRN   No current facility-administered medications for this visit.    Allergies:  Bee venom and Niacin   Social History: The patient  reports that he quit smoking about 36 years ago. His smoking use included Cigarettes. He has a 2.50 pack-year smoking history. He quit smokeless tobacco use about 4 years ago. His smokeless tobacco use included Chew. He reports that he does not drink alcohol or use drugs.   ROS:  Please see the history of present illness. Otherwise, complete review of systems is positive for hearing loss, states that he broke his left foot in the last year. Still uses a brace on the left foot with chronic left-sided weakness.  All other systems are reviewed and negative.   Physical Exam: VS:  BP 130/78   Pulse 67   Ht 5\' 9"  (1.753 m)   Wt 280 lb (127 kg)   SpO2 96%   BMI 41.35 kg/m , BMI Body mass index is 41.35 kg/m.  Wt Readings from Last 3 Encounters:  06/15/17 280 lb (127 kg)  06/13/17 280 lb 0.6 oz (127 kg)  04/14/17 284 lb (128.8 kg)    General: Obese male, appears comfortable at rest. HEENT: Conjunctiva and lids normal, oropharynx clear. Neck: Supple, no elevated JVP or carotid bruits, no thyromegaly. Lungs: Clear to auscultation, nonlabored breathing at rest. Cardiac: Regular rate and rhythm, no S3, 2/6 systolic murmur, no pericardial rub. Abdomen: Obese with pannus, nontender, bowel sounds present. Extremities: Brace on left leg, distal pulses1- 2+. Skin: Warm and dry. Musculoskeletal: No kyphosis. Neuropsychiatric: Alert and oriented x3, affect grossly appropriate.  ECG: I personally reviewed the tracing from 11/25/2016 which showed sinus rhythm with possible left atrial enlargement and leftward axis.   Recent Labwork: 11/25/2016: Hemoglobin 15.2; Platelets 210 04/19/2017: ALT 18; AST 20; BUN 18; Creat 1.02; Potassium 4.2; Sodium 140; TSH 3.05     Component Value Date/Time   CHOL 154  04/19/2017 0914   TRIG 192 (H) 04/19/2017 0914   HDL 41 04/19/2017 0914   CHOLHDL 3.8 04/19/2017 0914   VLDL 38 (H) 04/19/2017 0914   LDLCALC 75 04/19/2017 0914    Other Studies Reviewed Today:  Echocardiogram 06/03/2017: Study Conclusions  - Left ventricle: The cavity size was normal. Wall thickness was   increased in a pattern of mild LVH. Systolic function was normal.   The estimated ejection fraction was in the range of 60% to 65%.   Wall motion was normal; there were no regional wall motion   abnormalities. - Aortic valve: Mildly calcified annulus. Moderately calcified   leaflets. Cusp separation was reduced. Aortic valve not   completely interrogated for aortic stenosis. Valvular velocity is   mildly increased. Valve area by planimetry is 1.43  cm^2. Suspect   at least moderate aortic stenosis. There was trivial   regurgitation. - Mitral valve: Mildly to moderately calcified annulus. There was   trivial regurgitation. - Right atrium: Central venous pressure (est): 3 mm Hg. - Tricuspid valve: There was trivial regurgitation. - Pericardium, extracardiac: A prominent pericardial fat pad was   present.  Impressions:  - Mild LVH with LVEF 60-65%. Indeterminate diastolic function.   Mildly to moderately calcified mitral annulus with trivial mitral   regurgitation. Aortic valve not completely interrogated for   aortic stenosis, looks to be moderate range. There is trivial   aortic regurgitation. Trivial tricuspid regurgitation. Prominent   pericardial fat pad.  Lexiscan Myoview ([place]) 09/22/2011: No diagnostic ECG changes. Possible small region of basal septal/lateral ischemia with LVEF 53%.  Assessment and Plan:  1. Symptomatically stable multivessel CAD status post CABG in 2004. Ischemic testing from 2013 was low risk. He does not report progressive angina symptoms. We have discussed arranging a follow-up Myoview, but for now he was comfortable with observation unless  symptoms escalate. No changes made to current regimen.  2. Moderate aortic stenosis by recent follow-up echocardiogram. Follow-up study will be arranged next year.  3. Hyperlipidemia, continues on Zocor with recent LDL 75.  4. Essential hypertension, blood pressure is well controlled today.  Current medicines were reviewed with the patient today.   Orders Placed This Encounter  Procedures  . ECHOCARDIOGRAM COMPLETE    Disposition: Follow-up in one year, sooner if needed.  Signed, Satira Sark, MD, [facility] 06/15/2017 10:26 AM    [facility]. [address], [place], [place] [address] Phone: [phone]; Fax: [phone]

## 2017-06-15 ENCOUNTER — Ambulatory Visit (INDEPENDENT_AMBULATORY_CARE_PROVIDER_SITE_OTHER): Payer: Medicare Other | Admitting: Cardiology

## 2017-06-15 ENCOUNTER — Encounter: Payer: Self-pay | Admitting: Cardiology

## 2017-06-15 VITALS — BP 130/78 | HR 67 | Ht 69.0 in | Wt 280.0 lb

## 2017-06-15 DIAGNOSIS — I350 Nonrheumatic aortic (valve) stenosis: Secondary | ICD-10-CM | POA: Diagnosis not present

## 2017-06-15 DIAGNOSIS — E782 Mixed hyperlipidemia: Secondary | ICD-10-CM | POA: Diagnosis not present

## 2017-06-15 DIAGNOSIS — I10 Essential (primary) hypertension: Secondary | ICD-10-CM | POA: Diagnosis not present

## 2017-06-15 DIAGNOSIS — I2510 Atherosclerotic heart disease of native coronary artery without angina pectoris: Secondary | ICD-10-CM | POA: Diagnosis not present

## 2017-06-15 NOTE — Patient Instructions (Signed)
Your physician wants you to follow-up in: 1 year with Dr.McDowell You will receive a reminder letter in the mail two months in advance. If you don't receive a letter, please call our office to schedule the follow-up appointment.   Your physician has requested that you have an echocardiogram (in 1 year [place]). Echocardiography is a painless test that uses sound waves to create images of your heart. It provides your doctor with information about the size and shape of your heart and how well your heart's chambers and valves are working. This procedure takes approximately one hour. There are no restrictions for this procedure.    Your physician recommends that you continue on your current medications as directed. Please refer to the Current Medication list given to you today.    If you need a refill on your cardiac medications before your next appointment, please call your pharmacy.       No lab work or tests ordered today.        Thank you for choosing [facility] !

## 2017-06-21 ENCOUNTER — Other Ambulatory Visit: Payer: Self-pay | Admitting: Family Medicine

## 2017-07-19 DIAGNOSIS — I739 Peripheral vascular disease, unspecified: Secondary | ICD-10-CM | POA: Diagnosis not present

## 2017-07-19 DIAGNOSIS — B351 Tinea unguium: Secondary | ICD-10-CM | POA: Diagnosis not present

## 2017-07-19 DIAGNOSIS — L851 Acquired keratosis [keratoderma] palmaris et plantaris: Secondary | ICD-10-CM | POA: Diagnosis not present

## 2017-09-19 ENCOUNTER — Other Ambulatory Visit: Payer: Self-pay | Admitting: Family Medicine

## 2017-09-22 ENCOUNTER — Ambulatory Visit: Payer: Medicare Other | Admitting: Family Medicine

## 2017-09-27 DIAGNOSIS — L851 Acquired keratosis [keratoderma] palmaris et plantaris: Secondary | ICD-10-CM | POA: Diagnosis not present

## 2017-09-27 DIAGNOSIS — I739 Peripheral vascular disease, unspecified: Secondary | ICD-10-CM | POA: Diagnosis not present

## 2017-09-27 DIAGNOSIS — B351 Tinea unguium: Secondary | ICD-10-CM | POA: Diagnosis not present

## 2017-10-11 ENCOUNTER — Encounter: Payer: Self-pay | Admitting: Family Medicine

## 2017-10-11 ENCOUNTER — Other Ambulatory Visit: Payer: Self-pay

## 2017-10-11 ENCOUNTER — Ambulatory Visit (INDEPENDENT_AMBULATORY_CARE_PROVIDER_SITE_OTHER): Payer: Medicare Other | Admitting: Family Medicine

## 2017-10-11 VITALS — BP 134/78 | HR 84 | Temp 98.6°F | Resp 16 | Ht 69.0 in | Wt 282.0 lb

## 2017-10-11 DIAGNOSIS — E782 Mixed hyperlipidemia: Secondary | ICD-10-CM | POA: Diagnosis not present

## 2017-10-11 DIAGNOSIS — I69954 Hemiplegia and hemiparesis following unspecified cerebrovascular disease affecting left non-dominant side: Secondary | ICD-10-CM

## 2017-10-11 DIAGNOSIS — R7303 Prediabetes: Secondary | ICD-10-CM

## 2017-10-11 DIAGNOSIS — I10 Essential (primary) hypertension: Secondary | ICD-10-CM | POA: Diagnosis not present

## 2017-10-11 NOTE — Patient Instructions (Addendum)
F/U with rectal early September , call if you need me sooner  Fasting lipid, cmp and EGFr, HBA1C first week in February, we will contact you with results  Cardiology will call you for your October visit   Please continue to be careful not to fall  Continue to enjoy your family and life, all the best for 2019!

## 2017-10-12 ENCOUNTER — Encounter: Payer: Self-pay | Admitting: Family Medicine

## 2017-10-12 NOTE — Assessment & Plan Note (Signed)
Controlled, no change in medication DASH diet and commitment to daily physical activity for a minimum of 30 minutes discussed and encouraged, as a part of hypertension management. The importance of attaining a healthy weight is also discussed.  BP/Weight 10/11/2017 06/15/2017 06/13/2017 04/14/2017 12/06/2016 11/25/2016 0/51/8335  Systolic BP 825 189 842 103 128 118 -  Diastolic BP 78 78 82 80 70 55 -  Wt. (Lbs) 282 280 280.04 284 282 282 -  BMI 41.64 41.35 41.35 41.94 41.64 - 41.64

## 2017-10-12 NOTE — Assessment & Plan Note (Signed)
Hyperlipidemia:Low fat diet discussed and encouraged.   Lipid Panel  Lab Results  Component Value Date   CHOL 154 04/19/2017   HDL 41 04/19/2017   LDLCALC 75 04/19/2017   TRIG 192 (H) 04/19/2017   CHOLHDL 3.8 04/19/2017   Updated lab needed at/ before next visit.

## 2017-10-12 NOTE — Assessment & Plan Note (Signed)
Deteriorated. Patient re-educated about  the importance of commitment to a  minimum of 150 minutes of exercise per week.  The importance of healthy food choices with portion control discussed. Encouraged to start a food diary, count calories and to consider  joining a support group. Sample diet sheets offered. Goals set by the patient for the next several months.   Weight /BMI 10/11/2017 06/15/2017 06/13/2017  WEIGHT 282 lb 280 lb 280 lb 0.6 oz  HEIGHT 5\' 9"  5\' 9"  5\' 9"   BMI 41.64 kg/m2 41.35 kg/m2 41.35 kg/m2

## 2017-10-12 NOTE — Assessment & Plan Note (Signed)
Reports increased LLE weakness and decreased mobility, no interest in therapy currently

## 2017-10-12 NOTE — Progress Notes (Signed)
Craig Fields     MRN: [id number]      DOB: [birth year]   HPI Craig Fields is here for follow up and re-evaluation of chronic medical conditions, medication management and review of any available recent lab and radiology data.  Preventive health is updated, specifically  Cancer screening and Immunization.   Questions or concerns regarding consultations or procedures which the PT has had in the interim are  addressed. The PT denies any adverse reactions to current medications since the last visit.  There are no new concerns.  There are no specific complaints   ROS Denies recent fever or chills. Denies sinus pressure, nasal congestion, ear pain or sore throat. Denies chest congestion, productive cough or wheezing. Denies chest pains, palpitations and leg swelling Denies abdominal pain, nausea, vomiting,diarrhea or constipation.   Denies dysuria, frequency, hesitancy or incontinence. . Denies depression, anxiety or insomnia. 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   PE  BP 134/78 (BP Location: Left Arm, Patient Position: Sitting, Cuff Size: Normal)   Pulse 84   Temp 98.6 F (37 C) (Temporal)   Resp 16   Ht 5\' 9"  (1.753 m)   Wt 282 lb (127.9 kg)   SpO2 94%   BMI 41.64 kg/m   Patient alert and oriented and in no cardiopulmonary distress.  HEENT: No facial asymmetry, EOMI,   oropharynx pink and moist.  Neck decreased ROM no JVD, no mass.  Chest: Clear to auscultation bilaterally.  CVS: S1, S2 no murmurs, no S3.Regular rate.  ABD: Soft non tender.   Ext: No edema  LZ:[id number] ROM spine, shoulders, hips and knees.ontracture of left upper extremity and hand with LLE weakness  Skin: Intact, no ulcerations or rash noted.  Psych: Good eye contact, normal affect. Memory intact not anxious or depressed appearing.  CNS: CN 2-12 intact, left hemiparesis , grade 4 power  Assessment & Plan  Essential hypertension, benign Controlled, no change in medication DASH diet and  commitment to daily physical activity for a minimum of 30 minutes discussed and encouraged, as a part of hypertension management. The importance of attaining a healthy weight is also discussed.  BP/Weight 10/11/2017 06/15/2017 06/13/2017 04/14/2017 12/06/2016 11/25/2016 05/15/4096  Systolic BP 353 299 242 683 419 622 -  Diastolic BP 78 78 82 80 70 55 -  Wt. (Lbs) 282 280 280.04 284 282 282 -  BMI 41.64 41.35 41.35 41.94 41.64 - 41.64       Mixed hyperlipidemia Hyperlipidemia:Low fat diet discussed and encouraged.   Lipid Panel  Lab Results  Component Value Date   CHOL 154 04/19/2017   HDL 41 04/19/2017   LDLCALC 75 04/19/2017   TRIG 192 (H) 04/19/2017   CHOLHDL 3.8 04/19/2017   Updated lab needed at/ before next visit.     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 Patient re-educated about  the importance of commitment to a  minimum of 150 minutes of exercise per week.  The importance of healthy food choices with portion control discussed. Encouraged to start a food diary, count calories and to consider  joining a support group. Sample diet sheets offered. Goals set by the patient for the next several months.   Weight /BMI 10/11/2017 06/15/2017 06/13/2017  WEIGHT 282 lb 280 lb 280 lb 0.6 oz  HEIGHT 5\' 9"  5\' 9"  5\' 9"   BMI 41.64 kg/m2 41.35  kg/m2 41.35 kg/m2

## 2017-10-27 DIAGNOSIS — R7303 Prediabetes: Secondary | ICD-10-CM | POA: Diagnosis not present

## 2017-10-27 DIAGNOSIS — E782 Mixed hyperlipidemia: Secondary | ICD-10-CM | POA: Diagnosis not present

## 2017-10-27 DIAGNOSIS — I10 Essential (primary) hypertension: Secondary | ICD-10-CM | POA: Diagnosis not present

## 2017-10-28 LAB — LIPID PANEL
CHOL/HDL RATIO: 3.7 (calc) (ref <5.0)
CHOLESTEROL: 155 mg/dL (ref <200)
HDL: 42 mg/dL (ref >40)
LDL Cholesterol (Calc): 86 mg/dL (calc)
NON-HDL CHOLESTEROL (CALC): 113 mg/dL (ref <130)
Triglycerides: 168 mg/dL — ABNORMAL HIGH (ref <150)

## 2017-10-28 LAB — HEMOGLOBIN A1C
HEMOGLOBIN A1C: 6.3 %{Hb} — AB (ref <5.7)
Mean Plasma Glucose: 134 (calc)
eAG (mmol/L): 7.4 (calc)

## 2017-10-28 LAB — COMPLETE METABOLIC PANEL WITH GFR
AG Ratio: 1.6 (calc) (ref 1.0–2.5)
ALT: 17 U/L (ref 9–46)
AST: 21 U/L (ref 10–35)
Albumin: 4.4 g/dL (ref 3.6–5.1)
Alkaline phosphatase (APISO): 40 U/L (ref 40–115)
BUN: 22 mg/dL (ref 7–25)
CALCIUM: 9.6 mg/dL (ref 8.6–10.3)
CO2: 26 mmol/L (ref 20–32)
CREATININE: 1.15 mg/dL (ref 0.70–1.18)
Chloride: 104 mmol/L (ref 98–110)
GFR, EST NON AFRICAN AMERICAN: 61 mL/min/{1.73_m2} (ref >=60)
GFR, Est African American: 70 mL/min/{1.73_m2} (ref >=60)
GLOBULIN: 2.7 g/dL (ref 1.9–3.7)
Glucose, Bld: 114 mg/dL — ABNORMAL HIGH (ref 65–99)
Potassium: 4.4 mmol/L (ref 3.5–5.3)
Sodium: 139 mmol/L (ref 135–146)
Total Bilirubin: 0.6 mg/dL (ref 0.2–1.2)
Total Protein: 7.1 g/dL (ref 6.1–8.1)

## 2017-11-18 ENCOUNTER — Telehealth: Payer: Self-pay

## 2017-11-18 DIAGNOSIS — I10 Essential (primary) hypertension: Secondary | ICD-10-CM

## 2017-11-18 DIAGNOSIS — Z125 Encounter for screening for malignant neoplasm of prostate: Secondary | ICD-10-CM

## 2017-11-18 DIAGNOSIS — R7303 Prediabetes: Secondary | ICD-10-CM

## 2017-11-18 DIAGNOSIS — E782 Mixed hyperlipidemia: Secondary | ICD-10-CM

## 2017-11-18 NOTE — Telephone Encounter (Signed)
Order mailed

## 2017-12-06 DIAGNOSIS — B351 Tinea unguium: Secondary | ICD-10-CM | POA: Diagnosis not present

## 2017-12-06 DIAGNOSIS — I739 Peripheral vascular disease, unspecified: Secondary | ICD-10-CM | POA: Diagnosis not present

## 2017-12-06 DIAGNOSIS — L851 Acquired keratosis [keratoderma] palmaris et plantaris: Secondary | ICD-10-CM | POA: Diagnosis not present

## 2017-12-22 ENCOUNTER — Other Ambulatory Visit: Payer: Self-pay | Admitting: Family Medicine

## 2018-01-31 ENCOUNTER — Telehealth: Payer: Self-pay | Admitting: Family Medicine

## 2018-01-31 DIAGNOSIS — N3001 Acute cystitis with hematuria: Secondary | ICD-10-CM

## 2018-01-31 NOTE — Telephone Encounter (Signed)
Do you have any recommendations or want wife to collect a urine cup to collect specimen?

## 2018-01-31 NOTE — Telephone Encounter (Signed)
She can send urine to lab for UA and c/s, npo h/o fveer , chills flank pian I take it. If so he needs clinical evaluation

## 2018-01-31 NOTE — Telephone Encounter (Signed)
Patients wife called in to request an appt for patient because he has blood in his urine, she says it has cleared up a little but its still blood traces.  She doesn't have anyone to help her bring him in until Friday, I scheduled them for 02/09/18. Can patient do a nurse visit for this if he can get a ride ? She needs help bringing him in.

## 2018-02-01 DIAGNOSIS — N3001 Acute cystitis with hematuria: Secondary | ICD-10-CM | POA: Diagnosis not present

## 2018-02-01 NOTE — Telephone Encounter (Signed)
No other symptoms, urine sent to lab for testing

## 2018-02-02 LAB — URINALYSIS
Bilirubin Urine: NEGATIVE
GLUCOSE, UA: NEGATIVE
Hgb urine dipstick: NEGATIVE
Ketones, ur: NEGATIVE
Nitrite: NEGATIVE
PROTEIN: NEGATIVE
Specific Gravity, Urine: 1.023 (ref 1.001–1.03)
pH: <=5 (ref 5.0–8.0)

## 2018-02-02 LAB — URINE CULTURE
MICRO NUMBER: 90624419
Result:: NO GROWTH
SPECIMEN QUALITY: ADEQUATE

## 2018-02-07 ENCOUNTER — Telehealth: Payer: Self-pay | Admitting: Family Medicine

## 2018-02-07 NOTE — Telephone Encounter (Signed)
Wife aware

## 2018-02-07 NOTE — Telephone Encounter (Signed)
Patient left voicemail requesting a call back to discuss his results from his urine sample. Cb#: [id number]/ [phone]

## 2018-02-09 ENCOUNTER — Ambulatory Visit: Payer: Medicare Other | Admitting: Family Medicine

## 2018-02-21 DIAGNOSIS — B351 Tinea unguium: Secondary | ICD-10-CM | POA: Diagnosis not present

## 2018-02-21 DIAGNOSIS — L851 Acquired keratosis [keratoderma] palmaris et plantaris: Secondary | ICD-10-CM | POA: Diagnosis not present

## 2018-02-21 DIAGNOSIS — I739 Peripheral vascular disease, unspecified: Secondary | ICD-10-CM | POA: Diagnosis not present

## 2018-02-25 ENCOUNTER — Other Ambulatory Visit: Payer: Self-pay | Admitting: Family Medicine

## 2018-03-16 ENCOUNTER — Other Ambulatory Visit: Payer: Self-pay | Admitting: Family Medicine

## 2018-05-02 DIAGNOSIS — Z125 Encounter for screening for malignant neoplasm of prostate: Secondary | ICD-10-CM | POA: Diagnosis not present

## 2018-05-02 DIAGNOSIS — E782 Mixed hyperlipidemia: Secondary | ICD-10-CM | POA: Diagnosis not present

## 2018-05-02 DIAGNOSIS — I10 Essential (primary) hypertension: Secondary | ICD-10-CM | POA: Diagnosis not present

## 2018-05-02 DIAGNOSIS — I739 Peripheral vascular disease, unspecified: Secondary | ICD-10-CM | POA: Diagnosis not present

## 2018-05-02 DIAGNOSIS — R7303 Prediabetes: Secondary | ICD-10-CM | POA: Diagnosis not present

## 2018-05-02 DIAGNOSIS — L851 Acquired keratosis [keratoderma] palmaris et plantaris: Secondary | ICD-10-CM | POA: Diagnosis not present

## 2018-05-02 DIAGNOSIS — B351 Tinea unguium: Secondary | ICD-10-CM | POA: Diagnosis not present

## 2018-05-03 LAB — COMPLETE METABOLIC PANEL WITH GFR
AG RATIO: 1.7 (calc) (ref 1.0–2.5)
ALKALINE PHOSPHATASE (APISO): 35 U/L — AB (ref 40–115)
ALT: 14 U/L (ref 9–46)
AST: 18 U/L (ref 10–35)
Albumin: 4.4 g/dL (ref 3.6–5.1)
BILIRUBIN TOTAL: 0.7 mg/dL (ref 0.2–1.2)
BUN: 18 mg/dL (ref 7–25)
CHLORIDE: 101 mmol/L (ref 98–110)
CO2: 28 mmol/L (ref 20–32)
Calcium: 9.8 mg/dL (ref 8.6–10.3)
Creat: 1.01 mg/dL (ref 0.70–1.18)
GFR, EST NON AFRICAN AMERICAN: 70 mL/min/{1.73_m2} (ref >=60)
GFR, Est African American: 82 mL/min/{1.73_m2} (ref >=60)
Globulin: 2.6 g/dL (calc) (ref 1.9–3.7)
Glucose, Bld: 103 mg/dL — ABNORMAL HIGH (ref 65–99)
POTASSIUM: 4.6 mmol/L (ref 3.5–5.3)
SODIUM: 139 mmol/L (ref 135–146)
Total Protein: 7 g/dL (ref 6.1–8.1)

## 2018-05-03 LAB — CBC
HEMATOCRIT: 44.3 % (ref 38.5–50.0)
HEMOGLOBIN: 14.6 g/dL (ref 13.2–17.1)
MCH: 30.8 pg (ref 27.0–33.0)
MCHC: 33 g/dL (ref 32.0–36.0)
MCV: 93.5 fL (ref 80.0–100.0)
MPV: 9.6 fL (ref 7.5–12.5)
Platelets: 181 10*3/uL (ref 140–400)
RBC: 4.74 10*6/uL (ref 4.20–5.80)
RDW: 13.1 % (ref 11.0–15.0)
WBC: 7.1 10*3/uL (ref 3.8–10.8)

## 2018-05-03 LAB — TSH: TSH: 3.07 mIU/L (ref 0.40–4.50)

## 2018-05-03 LAB — HEMOGLOBIN A1C
HEMOGLOBIN A1C: 6 %{Hb} — AB (ref <5.7)
MEAN PLASMA GLUCOSE: 126 (calc)
eAG (mmol/L): 7 (calc)

## 2018-05-03 LAB — LIPID PANEL
CHOL/HDL RATIO: 4.3 (calc) (ref <5.0)
CHOLESTEROL: 155 mg/dL (ref <200)
HDL: 36 mg/dL — AB (ref >40)
LDL CHOLESTEROL (CALC): 91 mg/dL
Non-HDL Cholesterol (Calc): 119 mg/dL (calc) (ref <130)
Triglycerides: 188 mg/dL — ABNORMAL HIGH (ref <150)

## 2018-05-03 LAB — PSA: PSA: 3.5 ng/mL (ref <=4.0)

## 2018-05-04 IMAGING — DX DG FOOT COMPLETE 3+V*L*
3 series · 3 of 3 positions shown · non-contrast
Comparison: Ankle series performed today

CLINICAL DATA: Fall this morning. Twisting injury left foot and
ankle.

EXAM:
LEFT FOOT - COMPLETE 3+ VIEW

[foot ap]
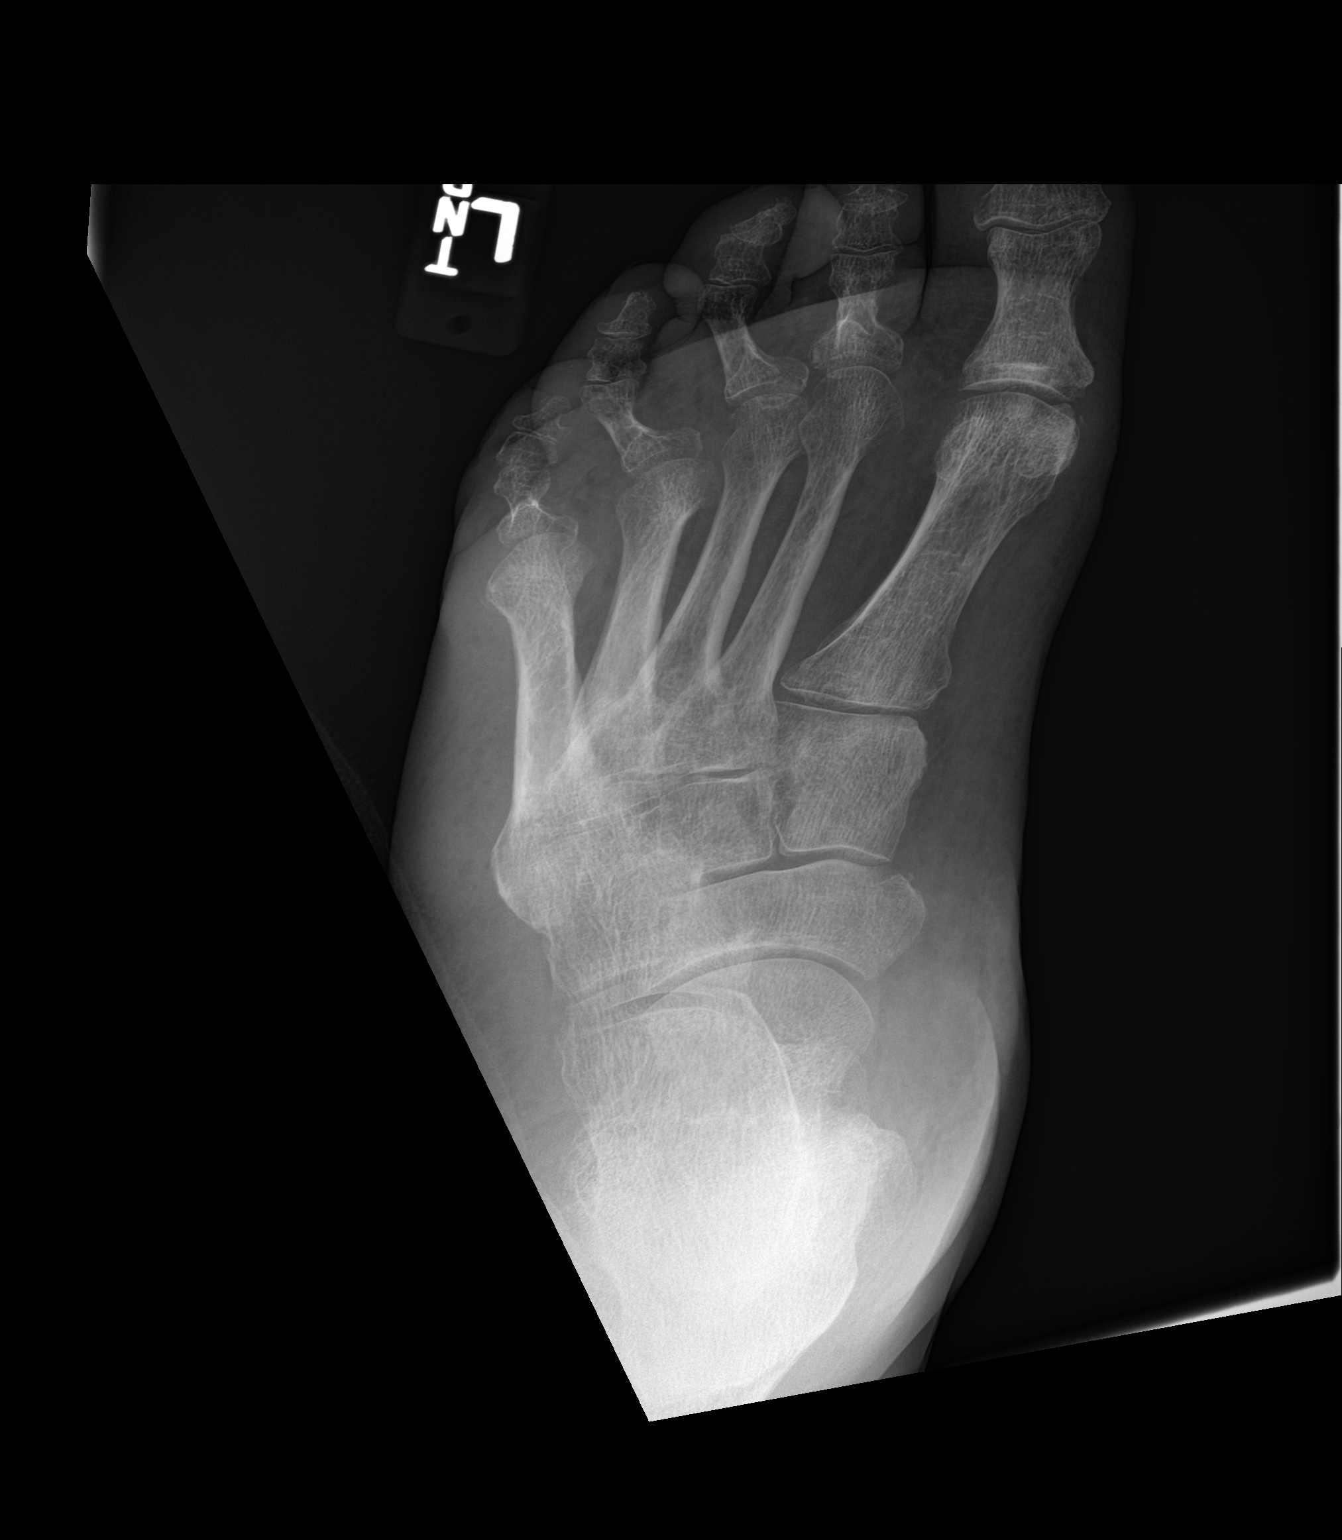

[foot obl]
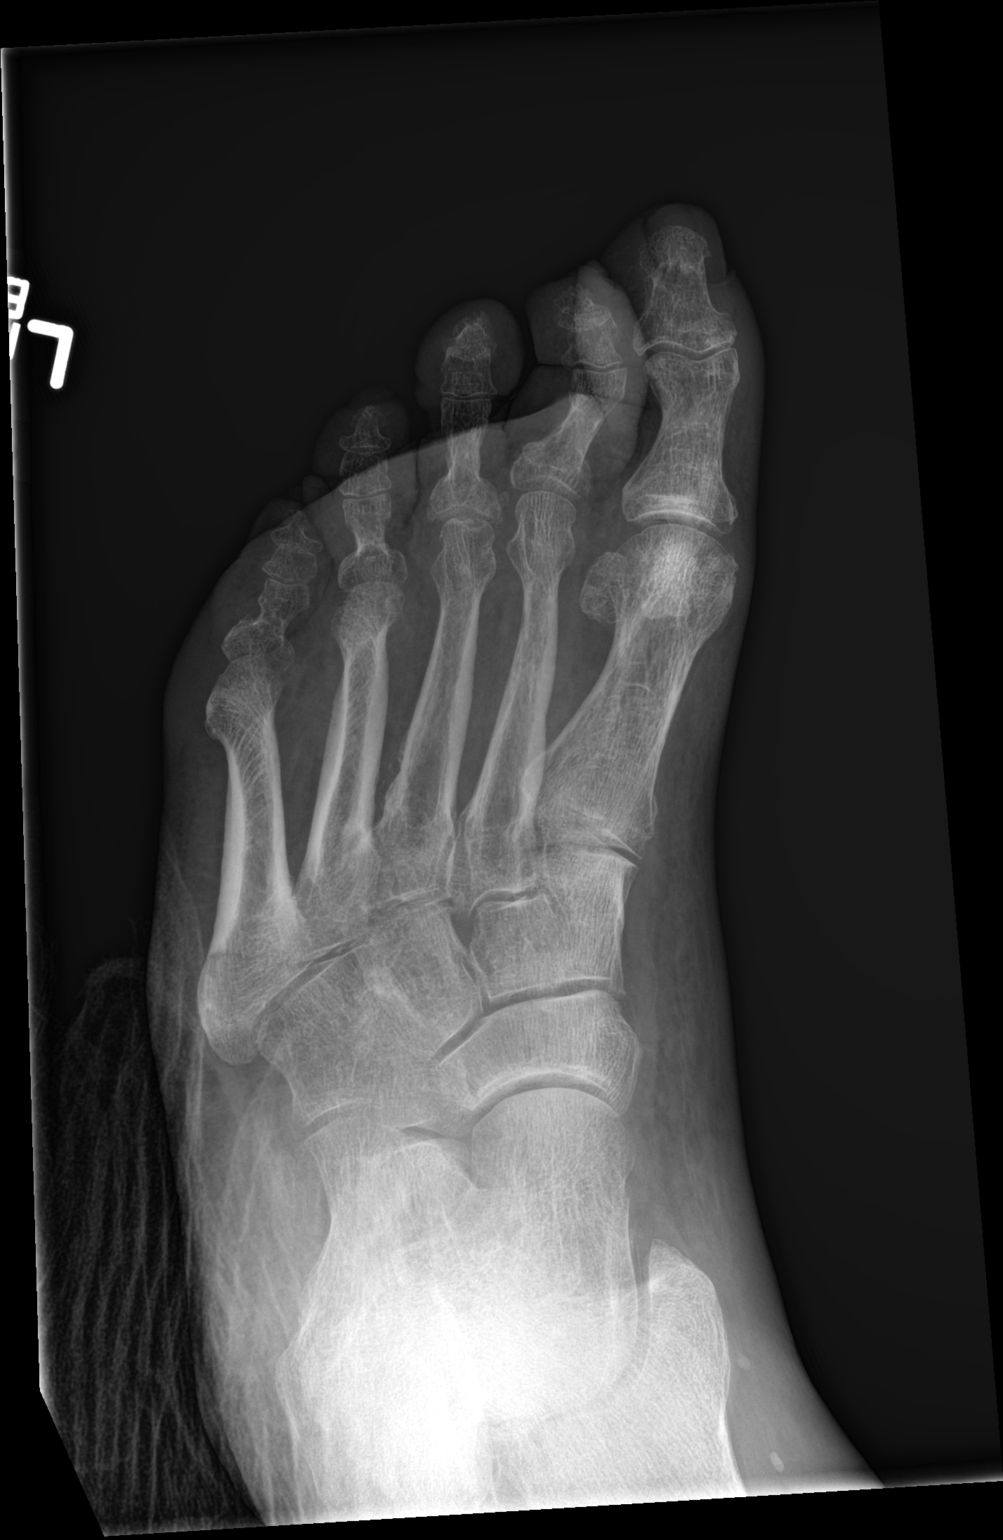

[foot lat]
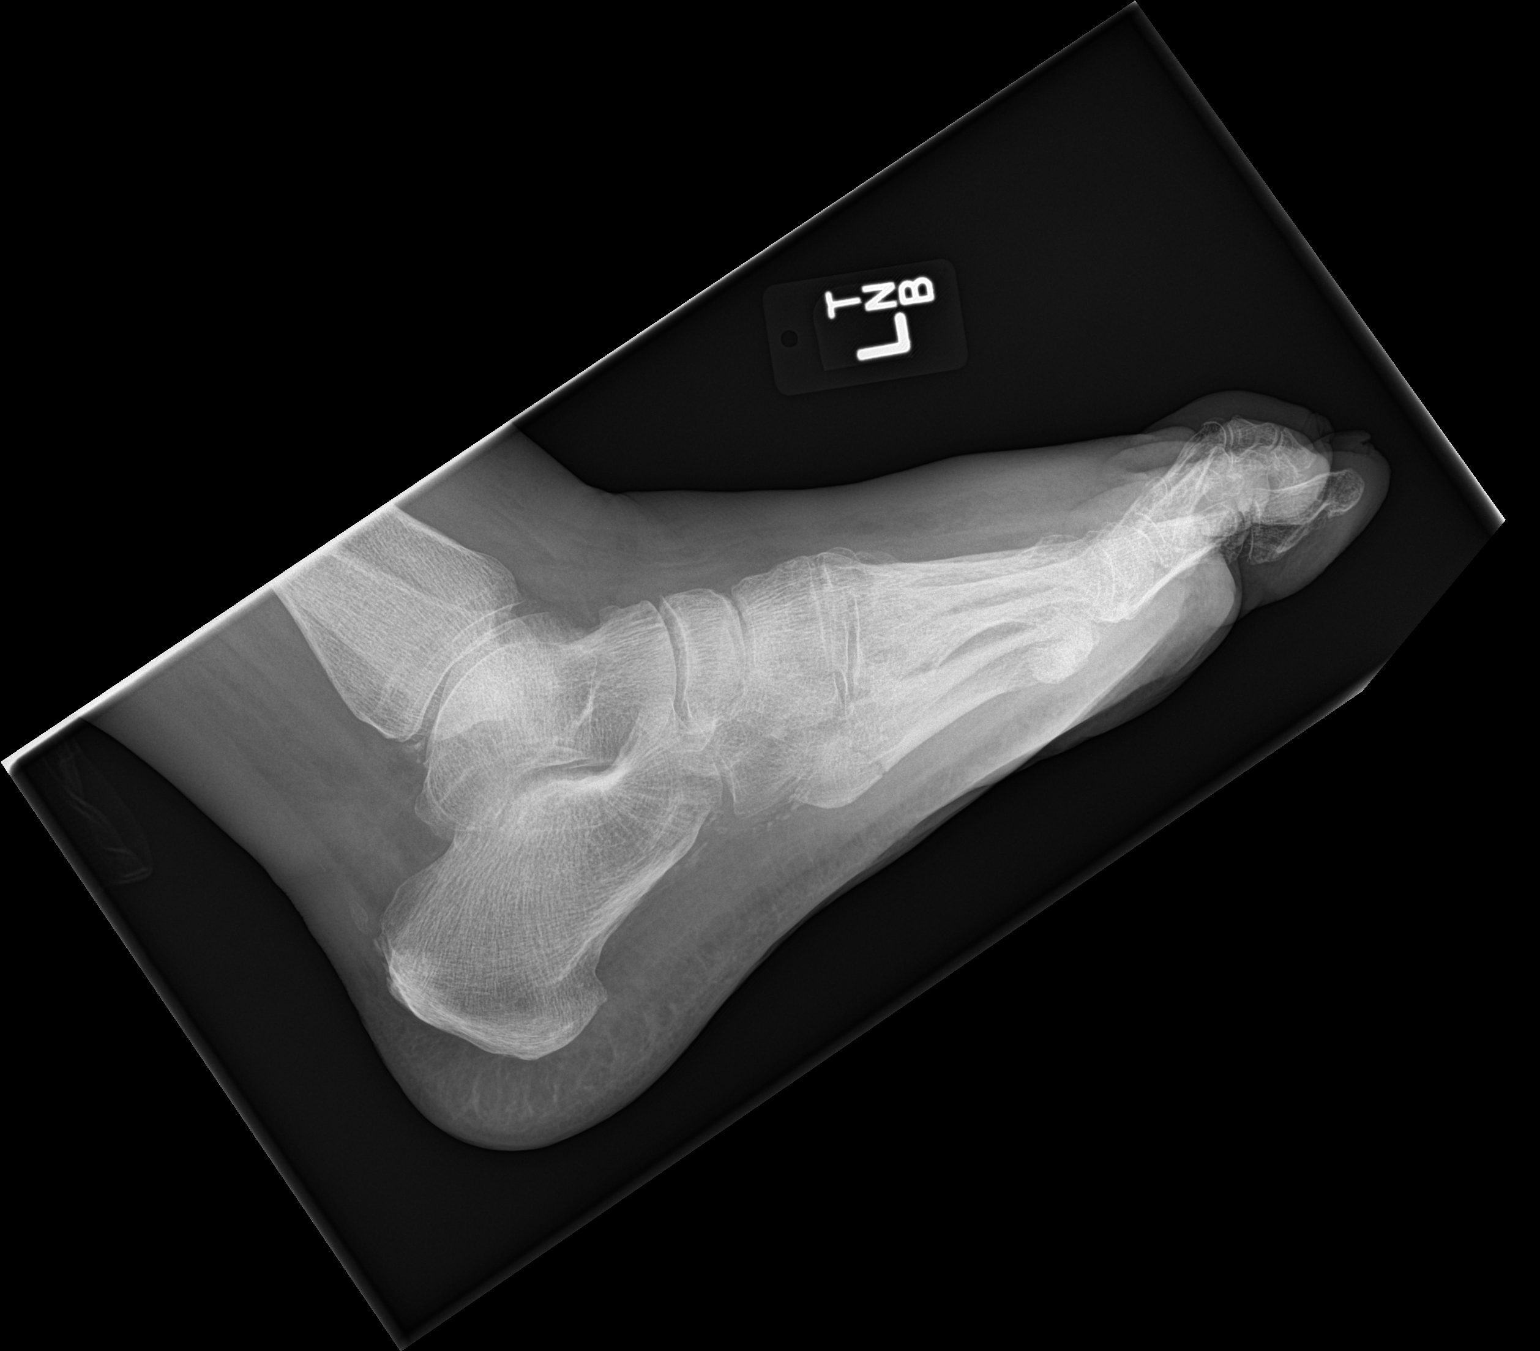

[3 of 3 positions shown; findings below may reference images not displayed]

FINDINGS: There is a nondisplaced fracture through the base of the left fifth
metatarsal. No other fracture noted. No subluxation or dislocation.
IMPRESSION: Nondisplaced fracture through the base of the left fifth metatarsal.

## 2018-05-04 IMAGING — DX DG ANKLE COMPLETE 3+V*L*
3 series · 3 of 3 positions shown · non-contrast
Comparison: None.

CLINICAL DATA: Fall this morning.  Twisted left ankle and foot.

EXAM:
LEFT ANKLE COMPLETE - 3+ VIEW

[ankle ap]
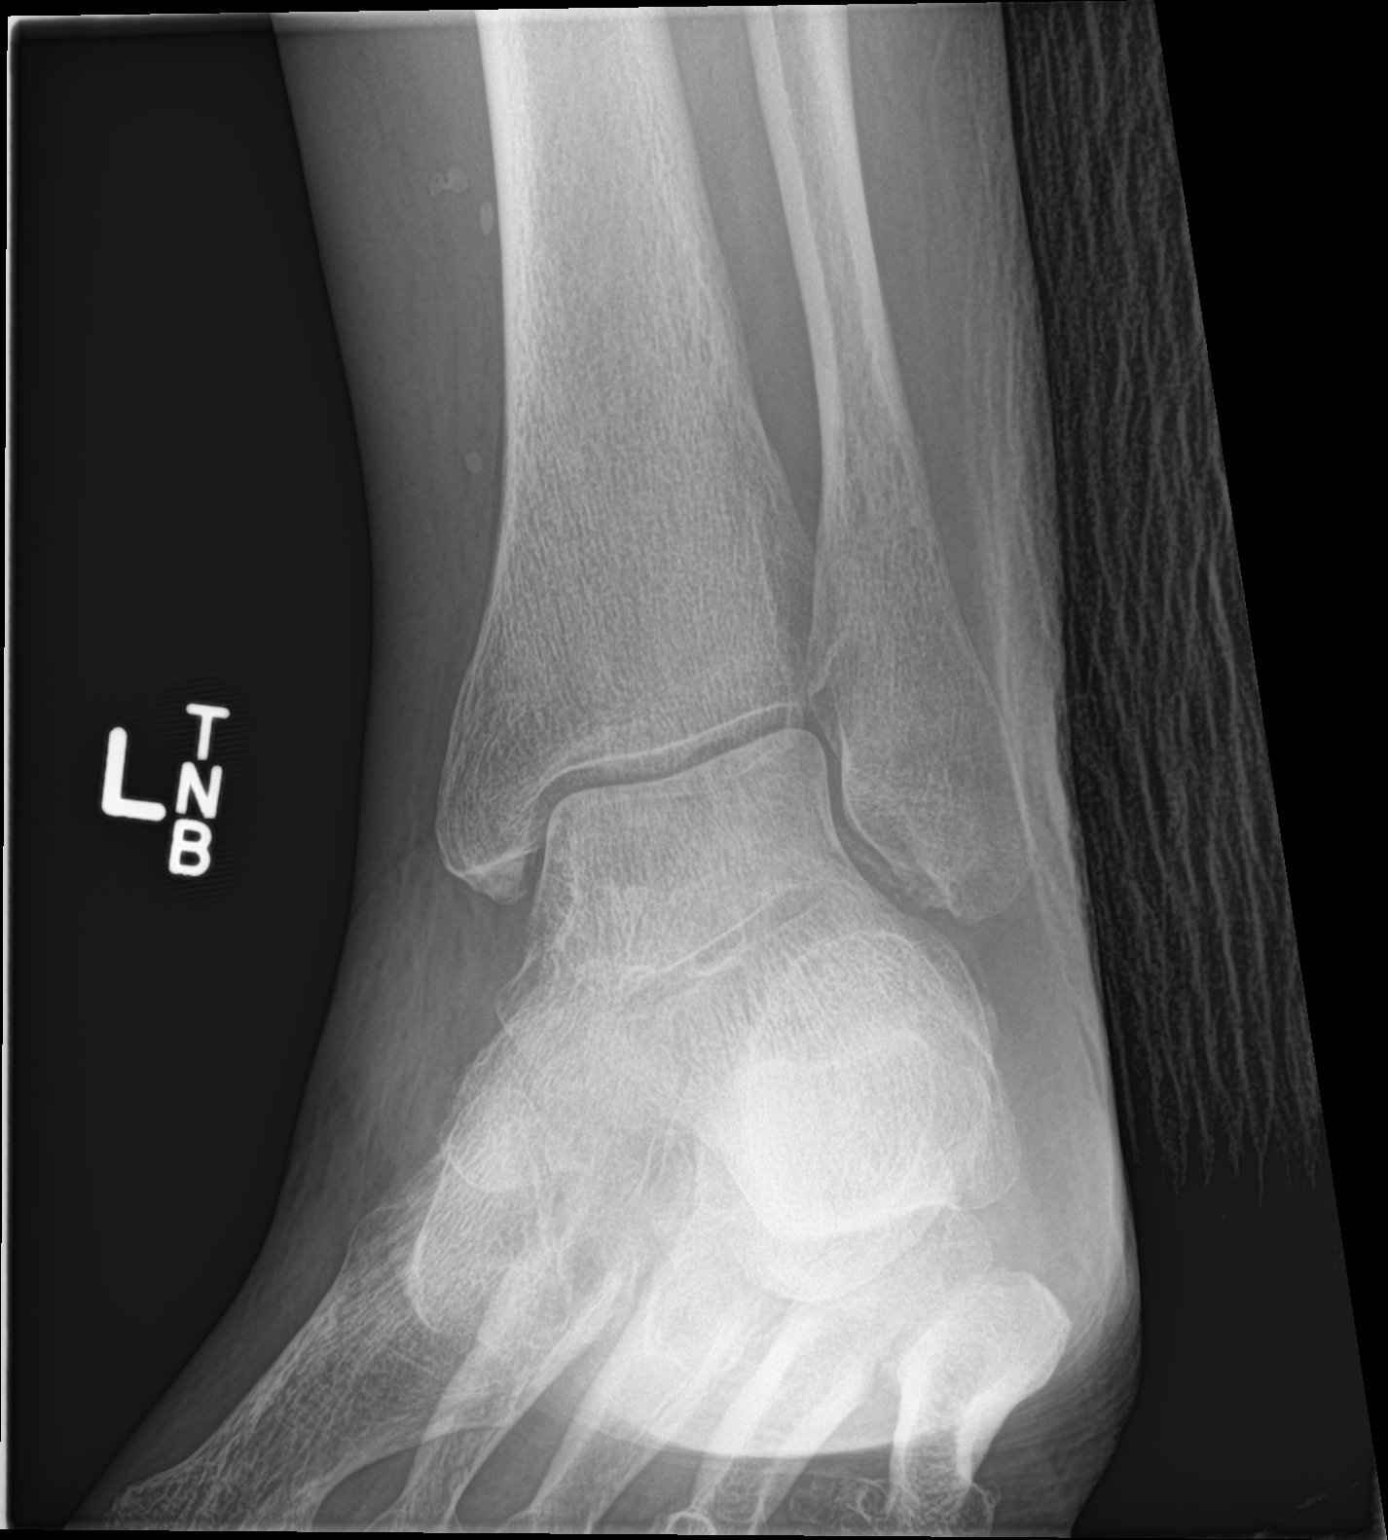

[ankle obl]
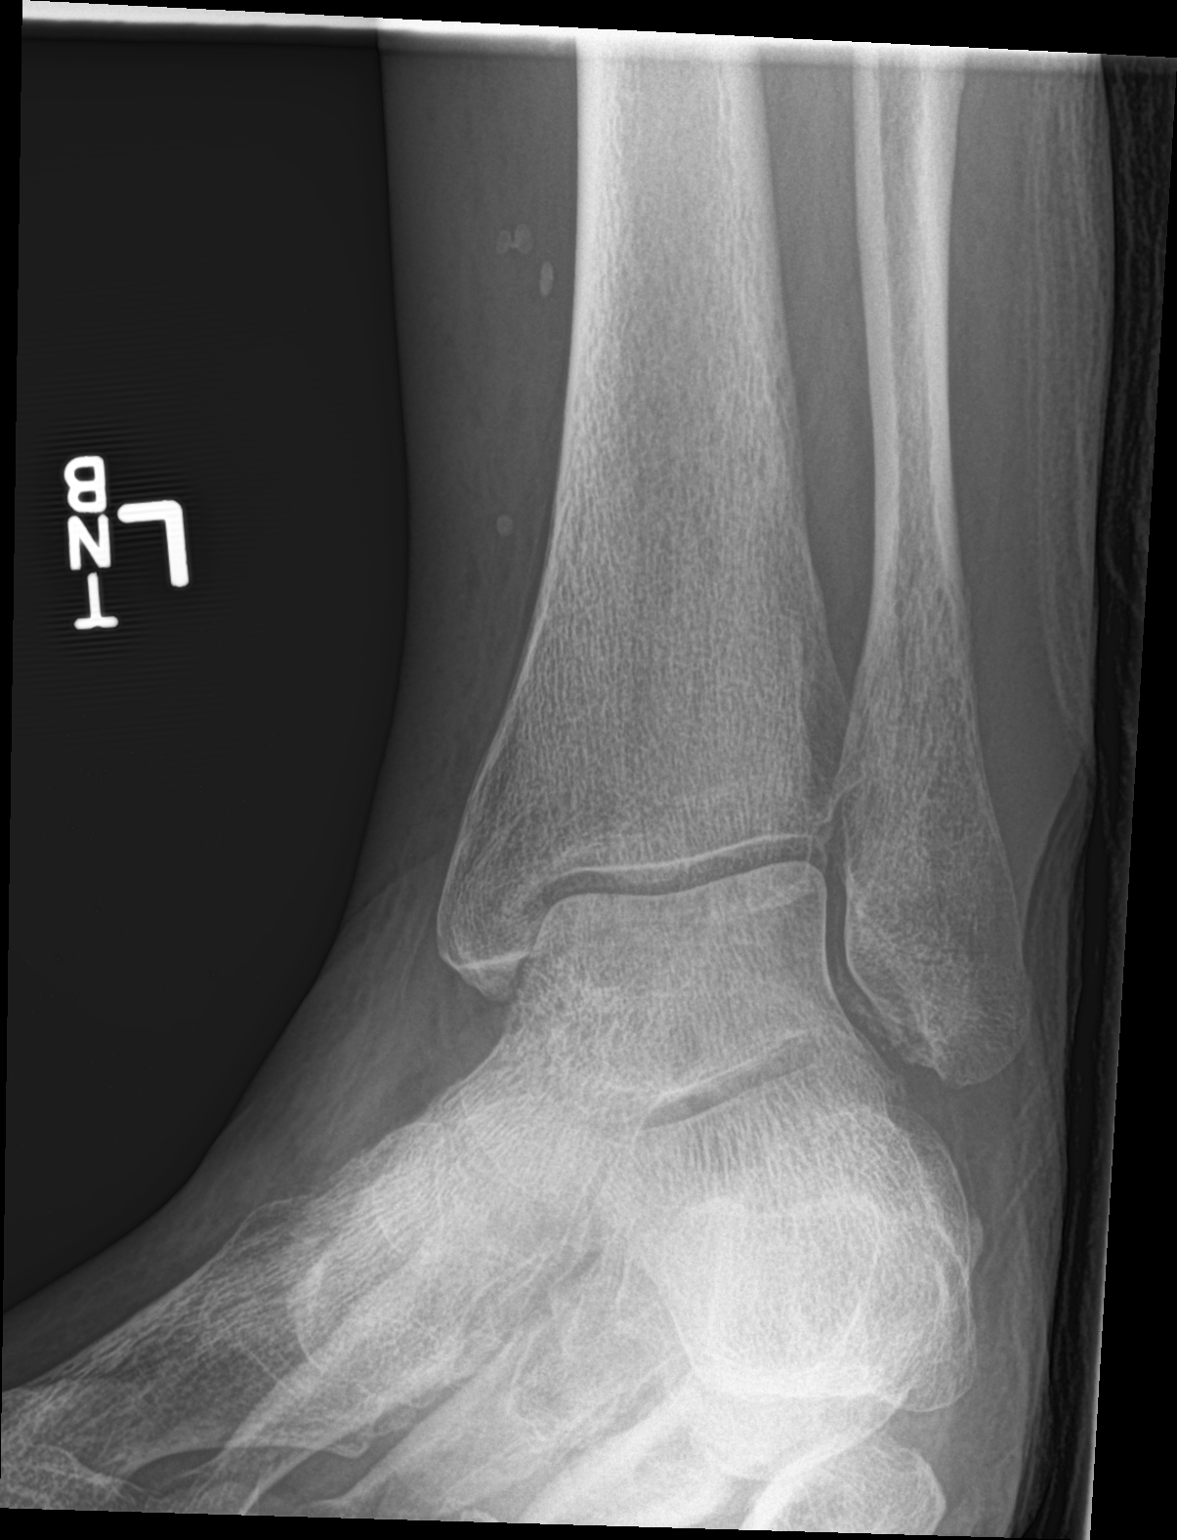

[ankle lat]
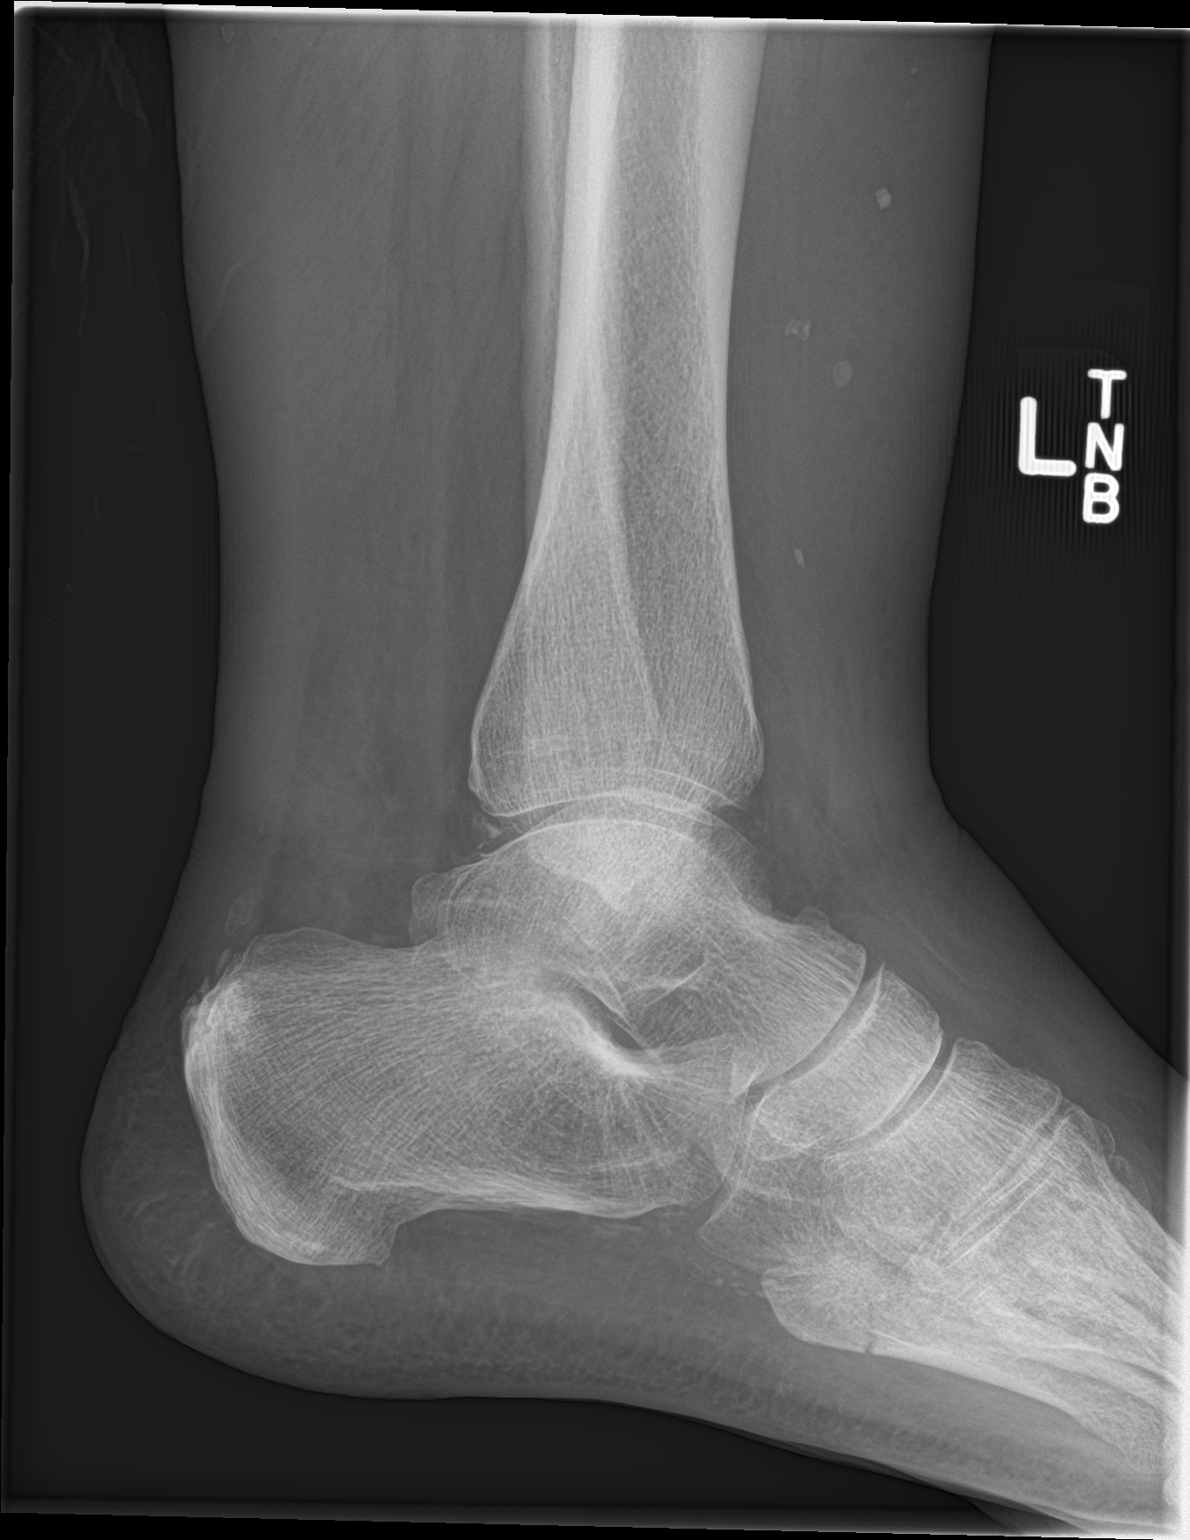

[3 of 3 positions shown; findings below may reference images not displayed]

FINDINGS: Nondisplaced fracture noted through the base of the left fifth
metatarsal. No additional fracture, subluxation or dislocation.
IMPRESSION: Nondisplaced fracture through the base of the left fifth metatarsal.

## 2018-05-11 ENCOUNTER — Emergency Department (HOSPITAL_COMMUNITY)
Admission: EM | Admit: 2018-05-11 | Discharge: 2018-05-11 | Disposition: A | Discharge status: Home / Self Care | Payer: Medicare Other | Attending: Emergency Medicine | Admitting: Emergency Medicine

## 2018-05-11 ENCOUNTER — Other Ambulatory Visit: Payer: Self-pay

## 2018-05-11 ENCOUNTER — Encounter (HOSPITAL_COMMUNITY): Payer: Self-pay | Admitting: Emergency Medicine

## 2018-05-11 ENCOUNTER — Emergency Department (HOSPITAL_COMMUNITY): Payer: Medicare Other

## 2018-05-11 DIAGNOSIS — Z79899 Other long term (current) drug therapy: Secondary | ICD-10-CM | POA: Insufficient documentation

## 2018-05-11 DIAGNOSIS — Z85038 Personal history of other malignant neoplasm of large intestine: Secondary | ICD-10-CM | POA: Insufficient documentation

## 2018-05-11 DIAGNOSIS — K439 Ventral hernia without obstruction or gangrene: Secondary | ICD-10-CM | POA: Insufficient documentation

## 2018-05-11 DIAGNOSIS — R1030 Lower abdominal pain, unspecified: Secondary | ICD-10-CM | POA: Diagnosis present

## 2018-05-11 DIAGNOSIS — Z951 Presence of aortocoronary bypass graft: Secondary | ICD-10-CM | POA: Diagnosis not present

## 2018-05-11 DIAGNOSIS — I10 Essential (primary) hypertension: Secondary | ICD-10-CM | POA: Insufficient documentation

## 2018-05-11 DIAGNOSIS — R109 Unspecified abdominal pain: Secondary | ICD-10-CM

## 2018-05-11 DIAGNOSIS — I2510 Atherosclerotic heart disease of native coronary artery without angina pectoris: Secondary | ICD-10-CM | POA: Insufficient documentation

## 2018-05-11 DIAGNOSIS — K432 Incisional hernia without obstruction or gangrene: Secondary | ICD-10-CM | POA: Diagnosis not present

## 2018-05-11 DIAGNOSIS — Z87891 Personal history of nicotine dependence: Secondary | ICD-10-CM | POA: Diagnosis not present

## 2018-05-11 DIAGNOSIS — Z7902 Long term (current) use of antithrombotics/antiplatelets: Secondary | ICD-10-CM | POA: Insufficient documentation

## 2018-05-11 DIAGNOSIS — K429 Umbilical hernia without obstruction or gangrene: Secondary | ICD-10-CM | POA: Diagnosis not present

## 2018-05-11 LAB — URINALYSIS, ROUTINE W REFLEX MICROSCOPIC
Bacteria, UA: NONE SEEN
Bilirubin Urine: NEGATIVE
Glucose, UA: NEGATIVE mg/dL
Hgb urine dipstick: NEGATIVE
Ketones, ur: 5 mg/dL — AB
Nitrite: NEGATIVE
Protein, ur: NEGATIVE mg/dL
Specific Gravity, Urine: 1.021 (ref 1.005–1.030)
pH: 5 (ref 5.0–8.0)

## 2018-05-11 LAB — CBC WITH DIFFERENTIAL/PLATELET
Basophils Absolute: 0 10*3/uL (ref 0.0–0.1)
Basophils Relative: 0 %
Eosinophils Absolute: 0.1 10*3/uL (ref 0.0–0.7)
Eosinophils Relative: 1 %
HCT: 43.8 % (ref 39.0–52.0)
Hemoglobin: 14.8 g/dL (ref 13.0–17.0)
Lymphocytes Relative: 10 %
Lymphs Abs: 1 10*3/uL (ref 0.7–4.0)
MCH: 31.7 pg (ref 26.0–34.0)
MCHC: 33.8 g/dL (ref 30.0–36.0)
MCV: 93.8 fL (ref 78.0–100.0)
Monocytes Absolute: 0.3 10*3/uL (ref 0.1–1.0)
Monocytes Relative: 3 %
Neutro Abs: 8.2 10*3/uL — ABNORMAL HIGH (ref 1.7–7.7)
Neutrophils Relative %: 86 %
Platelets: 183 10*3/uL (ref 150–400)
RBC: 4.67 MIL/uL (ref 4.22–5.81)
RDW: 13.7 % (ref 11.5–15.5)
WBC: 9.4 10*3/uL (ref 4.0–10.5)

## 2018-05-11 LAB — COMPREHENSIVE METABOLIC PANEL
ALT: 19 U/L (ref 0–44)
AST: 21 U/L (ref 15–41)
Albumin: 4.3 g/dL (ref 3.5–5.0)
Alkaline Phosphatase: 36 U/L — ABNORMAL LOW (ref 38–126)
Anion gap: 10 (ref 5–15)
BUN: 19 mg/dL (ref 8–23)
CO2: 26 mmol/L (ref 22–32)
Calcium: 9.6 mg/dL (ref 8.9–10.3)
Chloride: 100 mmol/L (ref 98–111)
Creatinine, Ser: 1.01 mg/dL (ref 0.61–1.24)
GFR calc Af Amer: >60 mL/min (ref >60)
GFR calc non Af Amer: >60 mL/min (ref >60)
Glucose, Bld: 154 mg/dL — ABNORMAL HIGH (ref 70–99)
Potassium: 4.4 mmol/L (ref 3.5–5.1)
Sodium: 136 mmol/L (ref 135–145)
Total Bilirubin: 0.8 mg/dL (ref 0.3–1.2)
Total Protein: 7.8 g/dL (ref 6.5–8.1)

## 2018-05-11 LAB — LIPASE, BLOOD: Lipase: 24 U/L (ref 11–51)

## 2018-05-11 MED ORDER — TRAMADOL HCL 50 MG PO TABS: 50.0000 mg | ORAL_TABLET | Freq: Four times a day (QID) | ORAL | 0 refills | Status: DC | PRN

## 2018-05-11 MED ORDER — IOPAMIDOL (ISOVUE-300) INJECTION 61%
100.0000 mL | Freq: Once | INTRAVENOUS | Status: AC | PRN
  Administered 2018-05-11: 100 mL via INTRAVENOUS

## 2018-05-11 MED ORDER — HYDROMORPHONE HCL 1 MG/ML IJ SOLN
0.5000 mg | Freq: Once | INTRAMUSCULAR | Status: AC
  Administered 2018-05-11: 0.5 mg via INTRAVENOUS
  Filled 2018-05-11: qty 1

## 2018-05-11 MED ORDER — SODIUM CHLORIDE 0.9 % IV SOLN
INTRAVENOUS | Status: DC
  Administered 2018-05-11: 19:00:00 via INTRAVENOUS

## 2018-05-11 MED ORDER — METOCLOPRAMIDE HCL 5 MG/ML IJ SOLN
5.0000 mg | Freq: Once | INTRAMUSCULAR | Status: AC
  Administered 2018-05-11: 5 mg via INTRAVENOUS
  Filled 2018-05-11: qty 2

## 2018-05-11 NOTE — Consult Note (Signed)
Reason for Consult: Incarcerated incisional hernia Referring Physician: Dr. Kennon Rounds [name] is an 77 y.o. male.  HPI: Patient is a 77 year old status post partial colectomy and incisional herniorrhaphy with mesh in the remote past who has had a known incisional hernia in the periumbilical region for some time now.  He has been constipated and developed a hard swelling where the hernia was.  It was tender and he presented to the emergency room for further evaluation and treatment.  CT scan of the abdomen confirmed bowel within the hernia.  Mild edema was noted.  Initial attempts at reduction were not successful.  When I went to see him, he stated that he had no pain at the hernia seemed to have reduced on its own.  He currently has 0 out of 10 abdominal pain.  Past Medical History:  Diagnosis Date  . Anxiety disorder   . Chronic back pain   . Colon cancer [facility]) May 2007  . Coronary atherosclerosis of native coronary artery    Multivessel status post CABG  . 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 Essential hypertension, benign   . Hyperlipidemia   . IGT (impaired glucose tolerance)   . Obesity   . OSA (obstructive sleep apnea)   . Seizures ([place])    Age 68 or 3, no meds and no reoccurances  . Stroke [facility])     Past Surgical History:  Procedure Laterality Date  . COLONOSCOPY N/A 07/15/2015   Procedure: COLONOSCOPY;  Surgeon: Aviva Signs, MD;  Location: [facility] ENDO SUITE;  Service: Gastroenterology;  Laterality: N/A;  . CORONARY ARTERY BYPASS GRAFT  2004   x4  . INCISIONAL HERNIA REPAIR N/A 01/01/2013   Procedure: HERNIA REPAIR INCISIONAL;  Surgeon: Jamesetta So, MD;  Location: [facility] ORS;  Service: General;  Laterality: N/A;  umbilical area  . INSERTION OF MESH N/A 01/01/2013   Procedure: INSERTION OF MESH;  Surgeon: Jamesetta So, MD;  Location: [facility] ORS;  Service: General;  Laterality: N/A;  . Left inguinal  hernia repair  2005  . [name]  2007    Family History   Problem Relation Age of Onset  . Pancreatic cancer Mother   . Heart disease Father   . Prostate cancer Father   . Stroke Brother   . Heart disease Brother   . Heart disease Brother   . COPD Sister   . Actinic keratosis Sister   . Obesity Son   . Anxiety disorder Son     Social History:  reports that he quit smoking about 37 years ago. His smoking use included cigarettes. He has a 2.50 pack-year smoking history. He quit smokeless tobacco use about 5 years ago.  His smokeless tobacco use included chew. He reports that he does not drink alcohol or use drugs.  Allergies:  Allergies  Allergen Reactions  . Bee Venom Swelling  . Niacin Other (See Comments)    Muscle aches    Medications: I have reviewed the patient's current medications.  Results for orders placed or performed during the hospital encounter of 05/11/18 (from the past 48 hour(s))  Comprehensive metabolic panel     Status: Abnormal   Collection Time: 05/11/18  6:27 PM  Result Value Ref Range   Sodium 136 135 - 145 mmol/L   Potassium 4.4 3.5 - 5.1 mmol/L   Chloride 100 98 - 111 mmol/L   CO2 26 22 - 32 mmol/L   Glucose, Bld 154 (  H) 70 - 99 mg/dL   BUN 19 8 - 23 mg/dL   Creatinine, Ser 1.01 0.61 - 1.24 mg/dL   Calcium 9.6 8.9 - 10.3 mg/dL   Total Protein 7.8 6.5 - 8.1 g/dL   Albumin 4.3 3.5 - 5.0 g/dL   AST 21 15 - 41 U/L   ALT 19 0 - 44 U/L   Alkaline Phosphatase 36 (L) 38 - 126 U/L   Total Bilirubin 0.8 0.3 - 1.2 mg/dL   GFR calc non Af Amer >60 >60 mL/min   GFR calc Af Amer >60 >60 mL/min    Comment: (NOTE) The eGFR has been calculated using the CKD EPI equation. This calculation has not been validated in all clinical situations. eGFR's persistently <60 mL/min signify possible Chronic Kidney Disease.    Anion gap 10 5 - 15    Comment: Performed at [facility], [address], [place], [place] [address]  CBC with Differential     Status: Abnormal   Collection Time: 05/11/18  6:27 PM  Result Value Ref  Range   WBC 9.4 4.0 - 10.5 K/uL   RBC 4.67 4.22 - 5.81 MIL/uL   Hemoglobin 14.8 13.0 - 17.0 g/dL   HCT 43.8 39.0 - 52.0 %   MCV 93.8 78.0 - 100.0 fL   MCH 31.7 26.0 - 34.0 pg   MCHC 33.8 30.0 - 36.0 g/dL   RDW 13.7 11.5 - 15.5 %   Platelets 183 150 - 400 K/uL   Neutrophils Relative % 86 %   Neutro Abs 8.2 (H) 1.7 - 7.7 K/uL   Lymphocytes Relative 10 %   Lymphs Abs 1.0 0.7 - 4.0 K/uL   Monocytes Relative 3 %   Monocytes Absolute 0.3 0.1 - 1.0 K/uL   Eosinophils Relative 1 %   Eosinophils Absolute 0.1 0.0 - 0.7 K/uL   Basophils Relative 0 %   Basophils Absolute 0.0 0.0 - 0.1 K/uL    Comment: Performed at [facility], [address] [place], [place] [address]  Lipase, blood     Status: None   Collection Time: 05/11/18  6:27 PM  Result Value Ref Range   Lipase 24 11 - 51 U/L    Comment: Performed at [facility], [address], [place], [place] [address]  Urinalysis, Routine w reflex microscopic     Status: Abnormal   Collection Time: 05/11/18  7:42 PM  Result Value Ref Range   Color, Urine YELLOW YELLOW   APPearance CLEAR CLEAR   Specific Gravity, Urine 1.021 1.005 - 1.030   pH 5.0 5.0 - 8.0   Glucose, UA NEGATIVE NEGATIVE mg/dL   Hgb urine dipstick NEGATIVE NEGATIVE   Bilirubin Urine NEGATIVE NEGATIVE   Ketones, ur 5 (A) NEGATIVE mg/dL   Protein, ur NEGATIVE NEGATIVE mg/dL   Nitrite NEGATIVE NEGATIVE   Leukocytes, UA TRACE (A) NEGATIVE   RBC / HPF 0-5 0 - 5 RBC/hpf   WBC, UA 11-20 0 - 5 WBC/hpf   Bacteria, UA NONE SEEN NONE SEEN   Squamous Epithelial / LPF 0-5 0 - 5   Mucus PRESENT     Comment: Performed at [facility], [address], [place], [place] [address]    Ct Abdomen Pelvis W Contrast  Result Date: 05/11/2018 CLINICAL DATA:  Lower abdominal pain with constipation for 1 week. History of colon cancer, sigmoid colectomy, stroke affecting LEFT side, hypertension. EXAM: CT ABDOMEN AND PELVIS WITH CONTRAST TECHNIQUE: Multidetector CT imaging of the abdomen and  pelvis was  performed using the standard protocol following bolus administration of intravenous contrast. CONTRAST:  144m ISOVUE-300 IOPAMIDOL (ISOVUE-300) INJECTION 61% COMPARISON:  CT pelvis dated 11/25/2016. FINDINGS: Lower chest: No acute findings. Hepatobiliary: No focal liver abnormality is seen. No gallstones, gallbladder wall thickening, or biliary dilatation. Pancreas: Unremarkable. No pancreatic ductal dilatation or surrounding inflammatory changes. Spleen: Normal in size without focal abnormality. Adrenals/Urinary Tract: Adrenal glands appear normal. Small LEFT renal cyst. Kidneys otherwise unremarkable without suspicious mass, stone or hydronephrosis. No ureteral or bladder calculi identified. Prostate is enlarged causing slight mass effect on the bladder base. Portion of the bladder extends into a RIGHT inguinal hernia. Stomach/Bowel: Surgical changes of a previous partial colectomy at the level of the mid/upper sigmoid colon. Large bowel is otherwise normal in caliber and configuration. Small bowel loops are appreciated at and just proximal to an umbilical hernia, however, there is no convincing transition zone at or proximal to the hernia to suggest associated obstruction. There are questionable inflammatory changes of the involved loops of small bowel at the hernia site. The hernia sac has a multiloculated appearance. Remainder of the small bowel is unremarkable without convincing bowel wall thickening or evidence of bowel wall inflammation. Appendix is normal. Stomach is unremarkable, decompressed. Vascular/Lymphatic: No significant vascular findings are present. No enlarged abdominal or pelvic lymph nodes. Reproductive: Prostate gland is moderately enlarged causing slight mass effect on the bladder base. Other: No free fluid or abscess collection. No free intraperitoneal air. Musculoskeletal: Degenerative changes throughout the thoracolumbar spine, including advanced degenerative change at the  lumbosacral junction with probable associated nerve root impingement at multiple levels. No acute or suspicious osseous finding. IMPRESSION: 1. Chronic multiloculated umbilical/periumbilical abdominal wall hernia, again containing a portion of the small bowel. There is now mild thickening/inflammation of the walls of the small bowel at and just proximal to the hernia site, suggesting localized enteritis of infectious or inflammatory nature, possibly representing intermittent incarceration. However, there is no proximal small bowel dilatation to suggest associated incarceration or obstruction at this time. 2. Remainder of the bowel is normal in caliber and configuration. Again, no evidence of bowel obstruction. 3. RIGHT inguinal hernia which contains a portion of the bladder. Bladder is otherwise unremarkable (nondistended). 4. Prostate is moderately enlarged causing slight mass effect on the bladder base. Consider correlation with PSA lab values. Electronically Signed   By: SFranki CabotM.D.   On: 05/11/2018 20:30    ROS:  Pertinent items are noted in HPI.  Blood pressure 139/60, pulse 70, temperature 97.9 F (36.6 C), temperature source Oral, resp. rate 12, SpO2 96 %. Physical Exam: Pleasant obese white male no acute distress Head is normocephalic, atraumatic Lungs clear to auscultation with equal breath sounds bilaterally Heart examination reveals regular rate and rhythm without S3, S4, murmurs Abdomen is rotund.  An obvious hernia defect is noted at the umbilical level below a surgical scar.  No hardness is noted.  The incarcerated hernia is reduced.  Soft, nontender, nondistended. CT scan images personally reviewed Assessment/Plan: Impression: Incarcerated incisional hernia, now reduced.  Multiple medical problems.  No need for acute surgical intervention at this time.  Patient states that this occurs maybe every 3 months.  It occurs when he gets constipated. Plan: Okay for discharge.  Patient  was instructed to call my office for follow-up appointment.  We may have to electively repair this hernia.  MAviva Signs8/29/2019, 9:25 PM

## 2018-05-11 NOTE — ED Triage Notes (Signed)
Pt c/o lower abd pain with consitpation x 1 week. lnbm in one week. Has been having small skinny stools. A/o nad

## 2018-05-16 ENCOUNTER — Ambulatory Visit (INDEPENDENT_AMBULATORY_CARE_PROVIDER_SITE_OTHER): Payer: Medicare Other | Admitting: Family Medicine

## 2018-05-16 ENCOUNTER — Other Ambulatory Visit: Payer: Self-pay

## 2018-05-16 ENCOUNTER — Telehealth: Payer: Self-pay | Admitting: Family Medicine

## 2018-05-16 ENCOUNTER — Encounter: Payer: Self-pay | Admitting: Family Medicine

## 2018-05-16 VITALS — BP 140/60 | HR 71 | Resp 12 | Ht 69.0 in | Wt 256.1 lb

## 2018-05-16 DIAGNOSIS — Z23 Encounter for immunization: Secondary | ICD-10-CM | POA: Diagnosis not present

## 2018-05-16 DIAGNOSIS — I10 Essential (primary) hypertension: Secondary | ICD-10-CM | POA: Diagnosis not present

## 2018-05-16 DIAGNOSIS — E785 Hyperlipidemia, unspecified: Secondary | ICD-10-CM

## 2018-05-16 DIAGNOSIS — R7303 Prediabetes: Secondary | ICD-10-CM

## 2018-05-16 DIAGNOSIS — K432 Incisional hernia without obstruction or gangrene: Secondary | ICD-10-CM

## 2018-05-16 MED ORDER — SIMVASTATIN 40 MG PO TABS: 40.0000 mg | ORAL_TABLET | Freq: Every day | ORAL | 3 refills | Status: DC

## 2018-05-16 NOTE — Assessment & Plan Note (Signed)
Sub optiaml though adequate control, no med change DASH diet and commitment to daily physical activity for a minimum of 30 minutes discussed and encouraged, as a part of hypertension management. The importance of attaining a healthy weight is also discussed.  BP/Weight 05/16/2018 05/11/2018 10/11/2017 06/15/2017 06/13/2017 04/14/2017 0/05/2329  Systolic BP 076 226 333 545 [phone]  Diastolic BP 60 60 78 78 82 80 70  Wt. (Lbs) 256.08 - 282 280 280.04 284 282  BMI 37.82 - 41.64 41.35 41.35 41.94 41.64

## 2018-05-16 NOTE — Assessment & Plan Note (Signed)
Currently reduced and symptomatic, aggressive bowel regime to ensure regular bowel movements is discussed

## 2018-05-16 NOTE — Assessment & Plan Note (Signed)
Not at goal Hyperlipidemia:Low fat diet discussed and encouraged.   Lipid Panel  Lab Results  Component Value Date   CHOL 155 10/27/2017   HDL 42 10/27/2017   LDLCALC 86 10/27/2017   TRIG 168 (H) 10/27/2017   CHOLHDL 3.7 10/27/2017  Updated lab needed at/ before next visit. Increase simvastatin to 40 mg

## 2018-05-16 NOTE — Telephone Encounter (Signed)
Pt needs to be given 3 stool cards to be returned , this is his colon cancer screen , he ha s had GI cancer  Please call advise him ?  Bobbie, need to reduce fat in diet ,  Simvastatin dose is  Increased to 40 mg daily and I sent this in'His ll goal is < 70current lDL91, and tH are high, goal is under 150 he is 86  Needs fasting lipid, cmp and EGFR and  hBa1C mid to end  January pls order and he needs original order  ?? pls ask Please give him new d/iwhich have the newinfo also

## 2018-05-16 NOTE — Assessment & Plan Note (Signed)
Improved greatly, he is applauded on this. Patient re-educated about  the importance of commitment to a  minimum of 150 minutes of exercise per week.  The importance of healthy food choices with portion control discussed. Encouraged to start a food diary, count calories and to consider  joining a support group. Sample diet sheets offered. Goals set by the patient for the next several months.   Weight /BMI 05/16/2018 10/11/2017 06/15/2017  WEIGHT 256 lb 1.3 oz 282 lb 280 lb  HEIGHT 5\' 9"  5\' 9"  5\' 9"   BMI 37.82 kg/m2 41.64 kg/m2 41.35 kg/m2

## 2018-05-16 NOTE — Progress Notes (Signed)
Craig Fields     MRN: [id number]      DOB: [birth year]   HPI Mr. Denapoli is here for follow up and re-evaluation of chronic medical conditions, medication management and review of any available recent lab and radiology data.  Preventive health is updated, specifically  Cancer screening and Immunization.   Was recently in the Ed with incarcerated hernia which was manualy reduced , and has been doing well as far as distensions and pain are concerned, still reports poor BM, none in 2 days The PT denies any adverse reactions to current medications since the last visit.  C/o poor mobility increasing fall risk so states not as active as he wpould like He has changed his food choice with excellent weight loss  ROS Denies recent fever or chills. Denies sinus pressure, nasal congestion, ear pain or sore throat. Denies chest congestion, productive cough or wheezing. Denies chest pains, palpitations and leg swelling Denies abdominal pain, nausea, vomiting,diarrhea or constipation.   Denies dysuria, frequency, hesitancy or incontinence. Denies headaches, seizures, numbness, or tingling. Denies depression, anxiety or insomnia. Denies skin break down or rash.   PE  BP 140/60 (BP Location: Right Arm, Patient Position: Sitting, Cuff Size: Large)   Pulse 71   Resp 12   Ht 5\' 9"  (1.753 m)   Wt 256 lb 1.3 Craig (116.2 kg)   SpO2 96% Comment: room air  BMI 37.82 kg/m   Patient alert and oriented and in no cardiopulmonary distress.  HEENT: No facial asymmetry, EOMI,   oropharynx pink and moist.  Neck supple no JVD, no mass.  Chest: Clear to auscultation bilaterally.  CVS: S1, S2 no murmurs, no S3.Regular rate.  ABD: Soft non tender.   Ext: No edema  DG:[id number]  ROM spine, shoulders, hips and knees.  Skin: Intact, no ulcerations or rash noted.  Psych: Good eye contact, normal affect. Memory intact not anxious or depressed appearing.  CNS: CN 2-12 intact, chronic hemiplegia  unchanged  Assessment & Plan  Essential hypertension, benign Sub optiaml though adequate control, no med change DASH diet and commitment to daily physical activity for a minimum of 30 minutes discussed and encouraged, as a part of hypertension management. The importance of attaining a healthy weight is also discussed.  BP/Weight 05/16/2018 05/11/2018 10/11/2017 06/15/2017 06/13/2017 04/14/2017 5/95/6387  Systolic BP 564 332 951 884 166 063 016  Diastolic BP 60 60 78 78 82 80 70  Wt. 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 Patient re-educated about  the importance of commitment to a  minimum of 150 minutes of exercise per week.  The importance of healthy food choices with portion control discussed. Encouraged to start a food diary, count calories and to consider  joining a support group. Sample diet sheets offered. Goals set by the patient for the next several months.   Weight /BMI 05/16/2018 10/11/2017 06/15/2017  WEIGHT 256 lb 1.3 Craig 282 lb 280 lb  HEIGHT 5\' 9"  5\' 9"  5\' 9"   BMI 37.82 kg/m2 41.64 kg/m2 41.35 kg/m2      Prediabetes Unchanged Patient educated about the importance of limiting  Carbohydrate intake , the need to commit to daily physical activity for a minimum of 30 minutes , and to commit weight loss. The fact that changes in all these areas will reduce or eliminate  all together the development of diabetes is stressed.   Diabetic Labs Latest Ref Rng & Units 05/11/2018 10/27/2017 04/19/2017 11/25/2016 06/10/2016  HbA1c <5.7 % of total Hgb - 6.3(H) 6.0(H) - 5.9(H)  Chol <200 mg/dL - 155 154 - -  HDL >40 mg/dL - 42 41 - -  Calc LDL mg/dL (calc) - 86 75 - -  Triglycerides <150 mg/dL - 168(H) 192(H) - -  Creatinine 0.61 - 1.24 mg/dL 1.01 1.15 1.02 1.16 1.02   BP/Weight 05/16/2018 05/11/2018 10/11/2017 06/15/2017 06/13/2017 04/14/2017 5/59/7416  Systolic  BP 384 536 468 032 122 482 500  Diastolic BP 60 60 78 78 82 80 70  Wt. (Lbs) 256.08 - 282 280 280.04 284 282  BMI 37.82 - 41.64 41.35 41.35 41.94 41.64   No flowsheet data found.    Recurrent incisional hernia Currently reduced and symptomatic, aggressive bowel regime to ensure regular bowel movements is discussed  Hyperlipidemia LDL goal <70 Not at goal Hyperlipidemia:Low fat diet discussed and encouraged.   Lipid Panel  Lab Results  Component Value Date   CHOL 155 10/27/2017   HDL 42 10/27/2017   LDLCALC 86 10/27/2017   TRIG 168 (H) 10/27/2017   CHOLHDL 3.7 10/27/2017  Updated lab needed at/ before next visit. Increase simvastatin to 40 mg

## 2018-05-16 NOTE — Assessment & Plan Note (Signed)
Unchanged Patient educated about the importance of limiting  Carbohydrate intake , the need to commit to daily physical activity for a minimum of 30 minutes , and to commit weight loss. The fact that changes in all these areas will reduce or eliminate all together the development of diabetes is stressed.   Diabetic Labs Latest Ref Rng & Units 05/11/2018 10/27/2017 04/19/2017 11/25/2016 06/10/2016  HbA1c <5.7 % of total Hgb - 6.3(H) 6.0(H) - 5.9(H)  Chol <200 mg/dL - 155 154 - -  HDL >40 mg/dL - 42 41 - -  Calc LDL mg/dL (calc) - 86 75 - -  Triglycerides <150 mg/dL - 168(H) 192(H) - -  Creatinine 0.61 - 1.24 mg/dL 1.01 1.15 1.02 1.16 1.02   BP/Weight 05/16/2018 05/11/2018 10/11/2017 06/15/2017 06/13/2017 04/14/2017 7/94/8016  Systolic BP 553 748 270 786 754 492 010  Diastolic BP 60 60 78 78 82 80 70  Wt. (Lbs) 256.08 - 282 280 280.04 284 282  BMI 37.82 - 41.64 41.35 41.35 41.94 41.64   No flowsheet data found.

## 2018-05-16 NOTE — Patient Instructions (Addendum)
F/U with MD early Feb after, please schedule before year end  Flu vaccine today  Labs will be reviewed with you by nurse, that you did last week at [facility] on excellent weight loss by changing food choice    Please continue to be careful  Thank you  for choosing [facility]. We consider it a privelige to serve you.  Delivering excellent health care in a caring and  compassionate way is our goal.  Partnering with you,  so that together we can achieve this goal is our strategy.     We will ned to contact you re increasing simvastatin odse and the need to remain vigilant about blood sugar  You need to return 3 stool cards for testing for hidden blood for colon cancer screen  You will need Fasting lipid, cmp and eGFr, HBA1C 3rd  week in January, we will mail this info to you

## 2018-05-17 NOTE — Telephone Encounter (Signed)
Craig Fields aware and labs and stool cards mailed

## 2018-05-17 NOTE — Addendum Note (Signed)
Addended by: Eual Fines on: 05/17/2018 09:04 AM   Modules accepted: Orders

## 2018-05-22 NOTE — ED Provider Notes (Signed)
[facility] EMERGENCY DEPARTMENT Provider Note   CSN: [id number] Arrival date & time: 05/11/18  1755     History   Chief Complaint Chief Complaint  Patient presents with  . Abdominal Pain    HPI Craig Fields is a 77 y.o. male.  HPI   76 year old male with abdominal pain.  He has been having pain for at least a month but is gotten sniffily worse in the last week or so.  Describes pain in his mid abdomen.  Comes and goes.  Denies any vomiting.  No urinary complaints.  Last bowel movement was almost a week ago reports the stool as being "skinny."  No fevers or chills.  Past Medical History:  Diagnosis Date  . Anxiety disorder   . Chronic back pain   . Colon cancer [facility]) May 2007  . Coronary atherosclerosis of native coronary artery    Multivessel status post CABG  . 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 Essential hypertension, benign   . Hyperlipidemia   . IGT (impaired glucose tolerance)   . Obesity   . OSA (obstructive sleep apnea)   . Seizures ([place])    Age 24 or 3, no meds and no reoccurances  . Stroke [facility])     Patient Active Problem List   Diagnosis Date Noted  . Recurrent incisional hernia   . Left leg weakness 02/28/2013  . Difficulty walking 02/28/2013  . Skin lesion 12/07/2012  . Skin tag 11/16/2011  . Prediabetes 07/19/2011  . 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 Hyperlipidemia LDL goal <70 02/21/2008  . Morbid obesity due to excess calories ([place]) 02/21/2008  . Essential hypertension, benign 02/21/2008  . Coronary atherosclerosis of native coronary artery 02/21/2008  . SLEEP APNEA 02/21/2008    Past Surgical History:  Procedure Laterality Date  . COLONOSCOPY N/A 07/15/2015   Procedure: COLONOSCOPY;  Surgeon: Aviva Signs, MD;  Location: [facility] ENDO SUITE;  Service: Gastroenterology;  Laterality: N/A;  . CORONARY ARTERY BYPASS GRAFT  2004   x4  . INCISIONAL HERNIA REPAIR N/A 01/01/2013   Procedure:  HERNIA REPAIR INCISIONAL;  Surgeon: Jamesetta So, MD;  Location: [facility] ORS;  Service: General;  Laterality: N/A;  umbilical area  . INSERTION OF MESH N/A 01/01/2013   Procedure: INSERTION OF MESH;  Surgeon: Jamesetta So, MD;  Location: [facility] ORS;  Service: General;  Laterality: N/A;  . Left inguinal  hernia repair  2005  . [name]  2007        Home Medications    Prior to Admission medications   Medication Sig Start Date End Date Taking? Authorizing Provider  acetaminophen (TYLENOL) 500 MG tablet Take 1,000 mg by mouth every 6 (six) hours as needed for mild pain or moderate pain.   Yes [provider]  aspirin (ASPIRIN LOW DOSE) 81 MG EC tablet Take 81 mg by mouth every evening.    Yes [provider]  Cholecalciferol (VITAMIN D) 2000 units CAPS Take 1 capsule by mouth daily.   Yes [provider]  clopidogrel (PLAVIX) 75 MG tablet TAKE 1 TABLET (75 MG TOTAL) BY MOUTH DAILY. 03/20/18  Yes Fayrene Helper, MD  Coenzyme Q10 (CO Q-10) 200 MG CAPS Take 1 tablet by mouth daily.    Yes [provider]  KLOR-CON M20 20 MEQ tablet TAKE 2 TABLETS (40 MEQ TOTAL) BY MOUTH DAILY. 03/20/18  Yes Fayrene Helper, MD  lisinopril (PRINIVIL,ZESTRIL) 20  MG tablet TAKE 1/2 TABLET BY MOUTH DAILY. Patient taking differently: Take 10 mg by mouth daily.  02/24/15  Yes Fayrene Helper, MD  metoprolol tartrate (LOPRESSOR) 50 MG tablet TAKE 1/2 TABLET BY MOUTH 2 (TWO) TIMES DAILY. Patient taking differently: Take 25 mg by mouth 2 (two) times daily.  03/20/18  Yes Fayrene Helper, MD  Multiple Vitamins-Minerals ([employer] SPECTRAVITE) TABS Take 1 tablet by mouth every evening.    Yes [provider]  Omega-3 Fatty Acids (FISH OIL) 1200 MG CAPS Take 2 capsules by mouth at bedtime. 1200 mg   Yes [provider]  polyethylene glycol powder (GLYCOLAX/MIRALAX) powder MIX TO TAKE 17 G DAILY AS DIRECTED Patient taking differently: Take 17 g by mouth every  morning.  11/26/16  Yes Fayrene Helper, MD  sodium chloride (OCEAN) 0.65 % SOLN nasal spray Place 1 spray into both nostrils as needed for congestion.   Yes [provider]  triamterene-hydrochlorothiazide (MAXZIDE-25) 37.5-25 MG tablet TAKE 1 TABLET BY MOUTH EVERY MORNING Patient taking differently: Take 1 tablet by mouth at bedtime.  09/19/17  Yes Fayrene Helper, MD  acetaminophen (TYLENOL 8 HOUR ARTHRITIS PAIN) 650 MG CR tablet Take by mouth.     [provider]  clotrimazole-betamethasone (LOTRISONE) cream APPLY TO AFFECTED AREA TWICE A DAY 03/21/17   Fayrene Helper, MD  simvastatin (ZOCOR) 40 MG tablet Take 1 tablet (40 mg total) by mouth at bedtime. 05/16/18   Fayrene Helper, MD  traMADol (ULTRAM) 50 MG tablet Take 1 tablet (50 mg total) by mouth every 6 (six) hours as needed. 05/11/18   Virgel Manifold, MD  UNABLE TO FIND Bilateral shoes and calliber plate (left) Dx D74.128 04/27/17   Fayrene Helper, MD    Family History Family History  Problem Relation Age of Onset  . Pancreatic cancer Mother   . Heart disease Father   . Prostate cancer Father   . Stroke Brother   . Heart disease Brother   . Heart disease Brother   . COPD Sister   . Actinic keratosis Sister   . Obesity Son   . Anxiety disorder Son     Social History Social History   Tobacco Use  . Smoking status: Former Smoker    Packs/day: 0.50    Years: 5.00    Pack years: 2.50    Types: Cigarettes    Last attempt to quit: 12/27/1980    Years since quitting: 37.4  . Smokeless tobacco: Former Systems developer    Types: Chew    Quit date: 01/24/2013  Substance Use Topics  . Alcohol use: No    Alcohol/week: 0.0 standard drinks  . Drug use: No     Allergies   Bee venom and Niacin   Review of Systems Review of Systems  All systems reviewed and negative, other than as noted in HPI.  Physical Exam Updated Vital Signs BP 139/60 (BP Location: Right Arm) Comment: Simultaneous filing. User  may not have seen previous data.  Pulse 68 Comment: Simultaneous filing. User may not have seen previous data.  Temp 97.9 F (36.6 C) (Oral)   Resp 12   SpO2 95% Comment: Simultaneous filing. User may not have seen previous data.  Physical Exam  Constitutional: He appears well-developed and well-nourished. No distress.  HENT:  Head: Normocephalic and atraumatic.  Eyes: Conjunctivae are normal. Right eye exhibits no discharge. Left eye exhibits no discharge.  Neck: Neck supple.  Cardiovascular: Normal rate, regular rhythm and normal  heart sounds. Exam reveals no gallop and no friction rub.  No murmur heard. Pulmonary/Chest: Effort normal and breath sounds normal. No respiratory distress.  Abdominal: Soft. He exhibits no distension. There is no tenderness.  Umbilical hernia.  There is an area to the left of midline periumbilically which is very tender to the touch and firm subcutaneously.  Suspect that the hernia extends in this direction.  There are no overlying skin changes.  Musculoskeletal: He exhibits no edema or tenderness.  Neurological: He is alert.  Skin: Skin is warm and dry.  Psychiatric: He has a normal mood and affect. His behavior is normal. Thought content normal.  Nursing note and vitals reviewed.    ED Treatments / Results  Labs (all labs ordered are listed, but only abnormal results are displayed) Labs Reviewed  COMPREHENSIVE METABOLIC PANEL - Abnormal; Notable for the following components:      Result Value   Glucose, Bld 154 (*)    Alkaline Phosphatase 36 (*)    All other components within normal limits  URINALYSIS, ROUTINE W REFLEX MICROSCOPIC - Abnormal; Notable for the following components:   Ketones, ur 5 (*)    Leukocytes, UA TRACE (*)    All other components within normal limits  CBC WITH DIFFERENTIAL/PLATELET - Abnormal; Notable for the following components:   Neutro Abs 8.2 (*)    All other components within normal limits  LIPASE, BLOOD     EKG None  Radiology No results found.   Ct Abdomen Pelvis W Contrast  Result Date: 05/11/2018 CLINICAL DATA:  Lower abdominal pain with constipation for 1 week. History of colon cancer, sigmoid colectomy, stroke affecting LEFT side, hypertension. EXAM: CT ABDOMEN AND PELVIS WITH CONTRAST TECHNIQUE: Multidetector CT imaging of the abdomen and pelvis was performed using the standard protocol following bolus administration of intravenous contrast. CONTRAST:  188mL ISOVUE-300 IOPAMIDOL (ISOVUE-300) INJECTION 61% COMPARISON:  CT pelvis dated 11/25/2016. FINDINGS: Lower chest: No acute findings. Hepatobiliary: No focal liver abnormality is seen. No gallstones, gallbladder wall thickening, or biliary dilatation. Pancreas: Unremarkable. No pancreatic ductal dilatation or surrounding inflammatory changes. Spleen: Normal in size without focal abnormality. Adrenals/Urinary Tract: Adrenal glands appear normal. Small LEFT renal cyst. Kidneys otherwise unremarkable without suspicious mass, stone or hydronephrosis. No ureteral or bladder calculi identified. Prostate is enlarged causing slight mass effect on the bladder base. Portion of the bladder extends into a RIGHT inguinal hernia. Stomach/Bowel: Surgical changes of a previous partial colectomy at the level of the mid/upper sigmoid colon. Large bowel is otherwise normal in caliber and configuration. Small bowel loops are appreciated at and just proximal to an umbilical hernia, however, there is no convincing transition zone at or proximal to the hernia to suggest associated obstruction. There are questionable inflammatory changes of the involved loops of small bowel at the hernia site. The hernia sac has a multiloculated appearance. Remainder of the small bowel is unremarkable without convincing bowel wall thickening or evidence of bowel wall inflammation. Appendix is normal. Stomach is unremarkable, decompressed. Vascular/Lymphatic: No significant vascular  findings are present. No enlarged abdominal or pelvic lymph nodes. Reproductive: Prostate gland is moderately enlarged causing slight mass effect on the bladder base. Other: No free fluid or abscess collection. No free intraperitoneal air. Musculoskeletal: Degenerative changes throughout the thoracolumbar spine, including advanced degenerative change at the lumbosacral junction with probable associated nerve root impingement at multiple levels. No acute or suspicious osseous finding. IMPRESSION: 1. Chronic multiloculated umbilical/periumbilical abdominal wall hernia, again containing a portion of the  small bowel. There is now mild thickening/inflammation of the walls of the small bowel at and just proximal to the hernia site, suggesting localized enteritis of infectious or inflammatory nature, possibly representing intermittent incarceration. However, there is no proximal small bowel dilatation to suggest associated incarceration or obstruction at this time. 2. Remainder of the bowel is normal in caliber and configuration. Again, no evidence of bowel obstruction. 3. RIGHT inguinal hernia which contains a portion of the bladder. Bladder is otherwise unremarkable (nondistended). 4. Prostate is moderately enlarged causing slight mass effect on the bladder base. Consider correlation with PSA lab values. Electronically Signed   By: Franki Cabot M.D.   On: 05/11/2018 20:30    Procedures Procedures (including critical care time)  Medications Ordered in ED Medications  HYDROmorphone (DILAUDID) injection 0.5 mg (0.5 mg Intravenous Given 05/11/18 1911)  metoCLOPramide (REGLAN) injection 5 mg (5 mg Intravenous Given 05/11/18 1909)  iopamidol (ISOVUE-300) 61 % injection 100 mL (100 mLs Intravenous Contrast Given 05/11/18 1957)     Initial Impression / Assessment and Plan / ED Course  I have reviewed the triage vital signs and the nursing notes.  Pertinent labs & imaging results that were available during my care  of the patient were reviewed by me and considered in my medical decision making (see chart for details).     77 year old male with intermittent abdominal pain for approximately 1 week.  He does have a ventral hernia which I had difficulty reducing initially.  Subsequent ended up curb siding and general surgery.  Hernia was reducible at that time.  Despite his pain he had not been describing any obstructive symptoms.  Symptomatic treatment.  Outpatient follow-up otherwise.  Emergent return precautions were discussed. Final Clinical Impressions(s) / ED Diagnoses   Final diagnoses:  Abdominal pain, unspecified abdominal location  Ventral hernia without obstruction or gangrene    ED Discharge Orders         Ordered    traMADol (ULTRAM) 50 MG tablet  Every 6 hours PRN     05/11/18 2138           Virgel Manifold, MD 05/22/18 [phone]

## 2018-05-29 ENCOUNTER — Other Ambulatory Visit: Payer: Self-pay | Admitting: Family Medicine

## 2018-05-30 ENCOUNTER — Other Ambulatory Visit (INDEPENDENT_AMBULATORY_CARE_PROVIDER_SITE_OTHER): Payer: Medicare Other

## 2018-05-30 DIAGNOSIS — Z1211 Encounter for screening for malignant neoplasm of colon: Secondary | ICD-10-CM | POA: Diagnosis not present

## 2018-05-30 LAB — HEMOCCULT GUIAC POC 1CARD (OFFICE)
Card #2 Fecal Occult Blod, POC: NEGATIVE
Card #3 Fecal Occult Blood, POC: NEGATIVE
Fecal Occult Blood, POC: NEGATIVE

## 2018-06-14 ENCOUNTER — Ambulatory Visit: Payer: Medicare Other

## 2018-06-14 ENCOUNTER — Ambulatory Visit (INDEPENDENT_AMBULATORY_CARE_PROVIDER_SITE_OTHER): Payer: Medicare Other

## 2018-06-14 VITALS — BP 140/78 | HR 74 | Resp 15 | Ht 69.0 in | Wt 252.0 lb

## 2018-06-14 DIAGNOSIS — Z0000 Encounter for general adult medical examination without abnormal findings: Secondary | ICD-10-CM

## 2018-06-14 NOTE — Progress Notes (Signed)
Subjective:   Craig Fields is a 76 y.o. male who presents for Medicare Annual/Subsequent preventive examination.  Review of Systems:   Cardiac Risk Factors include: advanced age (>57men, >56 women);dyslipidemia;hypertension;obesity (BMI >30kg/m2);sedentary lifestyle     Objective:    Vitals: BP 140/78   Pulse 74   Resp 15   Ht 5\' 9"  (1.753 m)   Wt 252 lb (114.3 kg)   SpO2 94%   BMI 37.21 kg/m   Body mass index is 37.21 kg/m.  Advanced Directives 06/14/2018 05/11/2018 06/13/2017 11/24/2016 07/15/2015 12/05/2013 01/01/2013  Does Patient Have a Medical Advance Directive? No No No No No Patient does not have advance directive;Patient would like information Patient does not have advance directive;Patient would not like information  Would patient like information on creating a medical advance directive? Yes (ED - Information included in AVS) - Yes (MAU/Ambulatory/Procedural Areas - Information given) No - Patient declined No - patient declined information Advance directive packet given -  Pre-existing out of facility DNR order (yellow form or pink MOST form) - - - - - - No    Tobacco Social History   Tobacco Use  Smoking Status Former Smoker  . Packs/day: 0.50  . Years: 5.00  . Pack years: 2.50  . Types: Cigarettes  . Last attempt to quit: 12/27/1980  . Years since quitting: 37.4  Smokeless Tobacco Former Systems developer  . Types: Chew  . Quit date: 01/24/2013     Counseling given: Not Answered   Clinical Intake:     Pain : 0-10 Pain Score: 3  Pain Location: Knee Pain Frequency: Occasional     Nutritional Status: BMI > 30  Obese Diabetes: No  How often do you need to have someone help you when you read instructions, pamphlets, or other written materials from your doctor or pharmacy?: 2 - Rarely What is the last grade level you completed in school?: 12 th grade  Interpreter Needed?: No  Information entered by :: Kate Sable LPN   Past Medical History:  Diagnosis Date  .  Anxiety disorder   . Chronic back pain   . Colon cancer [facility]) May 2007  . Coronary atherosclerosis of native coronary artery    Multivessel status post CABG  . 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 Essential hypertension, benign   . Hyperlipidemia   . IGT (impaired glucose tolerance)   . Obesity   . OSA (obstructive sleep apnea)   . Seizures ([place])    Age 61 or 3, no meds and no reoccurances  . Stroke [facility])    Past Surgical History:  Procedure Laterality Date  . COLONOSCOPY N/A 07/15/2015   Procedure: COLONOSCOPY;  Surgeon: Aviva Signs, MD;  Location: [facility] ENDO SUITE;  Service: Gastroenterology;  Laterality: N/A;  . CORONARY ARTERY BYPASS GRAFT  2004   x4  . INCISIONAL HERNIA REPAIR N/A 01/01/2013   Procedure: HERNIA REPAIR INCISIONAL;  Surgeon: Jamesetta So, MD;  Location: [facility] ORS;  Service: General;  Laterality: N/A;  umbilical area  . INSERTION OF MESH N/A 01/01/2013   Procedure: INSERTION OF MESH;  Surgeon: Jamesetta So, MD;  Location: [facility] ORS;  Service: General;  Laterality: N/A;  . Left inguinal  hernia repair  2005  . [name]  2007   Family History  Problem Relation Age of Onset  . Pancreatic cancer Mother   . Heart disease Father   . Prostate cancer Father   . Stroke Brother   .  Heart disease Brother   . Heart disease Brother   . COPD Sister   . Actinic keratosis Sister   . Obesity Son   . Anxiety disorder Son    Social History   Socioeconomic History  . Marital status: Married    Spouse name: Not on file  . Number of children: 2  . Years of education: Not on file  . Highest education level: Not on file  Occupational History  . Occupation: retired   Scientific laboratory technician  . Financial resource strain: Not hard at all  . Food insecurity:    Worry: Never true    Inability: Never true  . Transportation needs:    Medical: No    Non-medical: No  Tobacco Use  . Smoking status: Former Smoker    Packs/day: 0.50    Years: 5.00    Pack years:  2.50    Types: Cigarettes    Last attempt to quit: 12/27/1980    Years since quitting: 37.4  . Smokeless tobacco: Former Systems developer    Types: [place] date: 01/24/2013  Substance and Sexual Activity  . Alcohol use: No    Alcohol/week: 0.0 standard drinks  . Drug use: No  . Sexual activity: Not Currently    Birth control/protection: None  Lifestyle  . Physical activity:    Days per week: 0 days    Minutes per session: 0 min  . Stress: Not at all  Relationships  . Social connections:    Talks on phone: Once a week    Gets together: Once a week    Attends religious service: Never    Active member of club or organization: No    Attends meetings of clubs or organizations: Never    Relationship status: Married  Other Topics Concern  . Not on file  Social History Narrative  . Not on file    Outpatient Encounter Medications as of 06/14/2018  Medication Sig  . acetaminophen (TYLENOL 8 HOUR ARTHRITIS PAIN) 650 MG CR tablet Take by mouth.   Marland Kitchen acetaminophen (TYLENOL) 500 MG tablet Take 1,000 mg by mouth every 6 (six) hours as needed for mild pain or moderate pain.  Marland Kitchen aspirin (ASPIRIN LOW DOSE) 81 MG EC tablet Take 81 mg by mouth every evening.   . Cholecalciferol (VITAMIN D) 2000 units CAPS Take 1 capsule by mouth daily.  . clopidogrel (PLAVIX) 75 MG tablet TAKE 1 TABLET (75 MG TOTAL) BY MOUTH DAILY.  . clotrimazole-betamethasone (LOTRISONE) cream APPLY TO AFFECTED AREA TWICE A DAY  . Coenzyme Q10 (CO Q-10) 200 MG CAPS Take 1 tablet by mouth daily.   Marland Kitchen KLOR-CON M20 20 MEQ tablet TAKE 2 TABLETS (40 MEQ TOTAL) BY MOUTH DAILY.  Marland Kitchen lisinopril (PRINIVIL,ZESTRIL) 20 MG tablet TAKE 1/2 TABLET BY MOUTH DAILY. (Patient taking differently: Take 10 mg by mouth daily. )  . metoprolol tartrate (LOPRESSOR) 50 MG tablet TAKE 1/2 TABLET BY MOUTH 2 (TWO) TIMES DAILY. (Patient taking differently: Take 25 mg by mouth 2 (two) times daily. )  . Multiple Vitamins-Minerals (CVS SPECTRAVITE) TABS Take 1 tablet by  mouth every evening.   . Omega-3 Fatty Acids (FISH OIL) 1200 MG CAPS Take 2 capsules by mouth at bedtime. 1200 mg  . polyethylene glycol powder (GLYCOLAX/MIRALAX) powder MIX TO TAKE 17 G DAILY AS DIRECTED (Patient taking differently: Take 17 g by mouth every morning. )  . simvastatin (ZOCOR) 40 MG tablet Take 1 tablet (40 mg total) by mouth at bedtime.  Marland Kitchen  sodium chloride (OCEAN) 0.65 % SOLN nasal spray Place 1 spray into both nostrils as needed for congestion.  . traMADol (ULTRAM) 50 MG tablet Take 1 tablet (50 mg total) by mouth every 6 (six) hours as needed.  . triamterene-hydrochlorothiazide (MAXZIDE-25) 37.5-25 MG tablet TAKE 1 TABLET BY MOUTH EVERY MORNING  . UNABLE TO FIND Bilateral shoes and calliber plate (left) Dx V40.086   No facility-administered encounter medications on file as of 06/14/2018.     Activities of Daily Living In your present state of health, do you have any difficulty performing the following activities: 06/14/2018  Hearing? N  Vision? N  Difficulty concentrating or making decisions? N  Walking or climbing stairs? Y  Dressing or bathing? Y  Doing errands, shopping? Y  Preparing Food and eating ? N  Using the Toilet? N  In the past six months, have you accidently leaked urine? Y  Do you have problems with loss of bowel control? N  Managing your Medications? N  Managing your Finances? N  Housekeeping or managing your Housekeeping? N  Some recent data might be hidden    Patient Care Team: Fayrene Helper, MD as PCP - General Caprice Beaver, DPM as Consulting Physician (Podiatry) Satira Sark, MD as Consulting Physician (Cardiology) Aviva Signs, MD as Consulting Physician (General Surgery)   Assessment:   This is a routine wellness examination for Benedetto.  Exercise Activities and Dietary recommendations Current Exercise Habits: The patient does not participate in regular exercise at present, Exercise limited by: cardiac condition(s)  Goals     . Exercise 3x per week (30 min per time)     Recommend starting a routine exercise program at least 3 days a week for 30-45 minutes at a time as tolerated.        Fall Risk Fall Risk  05/16/2018 06/13/2017 06/10/2016 09/09/2015 06/02/2015  Falls in the past year? No Yes No Yes Yes  Number falls in past yr: - 1 - - 1  Injury with Fall? - Yes - - No  Comment - - - - -  Risk Factor Category  - High Fall Risk - - -  Risk for fall due to : - History of fall(s);Impaired balance/gait;Impaired mobility - - -  Follow up - Education provided;Falls prevention discussed;Falls evaluation completed - - -   Is the patient's home free of loose throw rugs in walkways, pet beds, electrical cords, etc?   yes      Grab bars in the bathroom? yes      Handrails on the stairs?   yes      Adequate lighting?   yes  Timed Get Up and Go Performed:   Depression Screen PHQ 2/9 Scores 05/16/2018 06/13/2017 06/10/2016 12/05/2013  PHQ - 2 Score 0 0 0 1    Cognitive Function     6CIT Screen 06/14/2018 06/13/2017  What Year? 0 points 0 points  What month? 0 points 0 points  What time? 0 points 0 points  Count back from 20 2 points 0 points  Months in reverse 0 points 0 points  Repeat phrase 0 points 0 points  Total Score 2 0    Immunization History  Administered Date(s) Administered  . H1N1 08/27/2008, 10/16/2008  . Influenza Split 07/06/2011, 06/07/2012  . Influenza Whole 06/14/2007, 06/24/2009, 06/30/2010  . Influenza,inj,Quad PF,6+ Mos 06/18/2013, 06/18/2014, 06/02/2015, 06/10/2016, 06/13/2017, 05/16/2018  . Pneumococcal Conjugate-13 04/10/2014  . Pneumococcal Polysaccharide-23 01/04/2007  . Td 04/15/2004  . Tdap 08/15/2014  .  Zoster 01/10/2007    Qualifies for Shingles Vaccine? Ask insurance if covered   Screening Tests Health Maintenance  Topic Date Due  . COLONOSCOPY  07/14/2020  . TETANUS/TDAP  08/15/2024  . INFLUENZA VACCINE  Completed  . PNA vac Low Risk Adult  Completed   Cancer  Screenings: Lung: Low Dose CT Chest recommended if Age 20-80 years, 30 pack-year currently smoking OR have quit w/in 15years. Patient does not qualify. Colorectal: up to date   Additional Screenings:  Hepatitis C Screening:      Plan:     I have personally reviewed and noted the following in the patient's chart:   . Medical and social history . Use of alcohol, tobacco or illicit drugs  . Current medications and supplements . Functional ability and status . Nutritional status . Physical activity . Advanced directives . List of other physicians . Hospitalizations, surgeries, and ER visits in previous 12 months . Vitals . Screenings to include cognitive, depression, and falls . Referrals and appointments  In addition, I have reviewed and discussed with patient certain preventive protocols, quality metrics, and best practice recommendations. A written personalized care plan for preventive services as well as general preventive health recommendations were provided to patient.     Kate Sable, LPN, LPN  19/02/2228

## 2018-06-14 NOTE — Patient Instructions (Signed)
Craig Fields , Thank you for taking time to come for your Medicare Wellness Visit. I appreciate your ongoing commitment to your health goals. Please review the following plan we discussed and let me know if I can assist you in the future.    Ask insurance if shingrix is covered  Wellness in 1 year    Screening recommendations/referrals: Colonoscopy: up to date  Recommended yearly ophthalmology/optometry visit for glaucoma screening and checkup Recommended yearly dental visit for hygiene and checkup  Vaccinations: Influenza vaccine:up to date Pneumococcal vaccine: up to date  Tdap vaccine: up to date Shingles vaccine: ask insurance if shingrix is covered   Advanced directives: done- form given   Conditions/risks identified: done   Next appointment: wellness in 1 year   Preventive Care 62 Years and Older, Male Preventive care refers to lifestyle choices and visits with your health care provider that can promote health and wellness. What does preventive care include?  A yearly physical exam. This is also called an annual well check.  Dental exams once or twice a year.  Routine eye exams. Ask your health care provider how often you should have your eyes checked.  Personal lifestyle choices, including:  Daily care of your teeth and gums.  Regular physical activity.  Eating a healthy diet.  Avoiding tobacco and drug use.  Limiting alcohol use.  Practicing safe sex.  Taking low doses of aspirin every day.  Taking vitamin and mineral supplements as recommended by your health care provider. What happens during an annual well check? The services and screenings done by your health care provider during your annual well check will depend on your age, overall health, lifestyle risk factors, and family history of disease. Counseling  Your health care provider may ask you questions about your:  Alcohol use.  Tobacco use.  Drug use.  Emotional well-being.  Home and  relationship well-being.  Sexual activity.  Eating habits.  History of falls.  Memory and ability to understand (cognition).  Work and work Statistician. Screening  You may have the following tests or measurements:  Height, weight, and BMI.  Blood pressure.  Lipid and cholesterol levels. These may be checked every 5 years, or more frequently if you are over 79 years old.  Skin check.  Lung cancer screening. You may have this screening every year starting at age 79 if you have a 30-pack-year history of smoking and currently smoke or have quit within the past 15 years.  Fecal occult blood test (FOBT) of the stool. You may have this test every year starting at age 41.  Flexible sigmoidoscopy or colonoscopy. You may have a sigmoidoscopy every 5 years or a colonoscopy every 10 years starting at age 23.  Prostate cancer screening. Recommendations will vary depending on your family history and other risks.  Hepatitis C blood test.  Hepatitis B blood test.  Sexually transmitted disease (STD) testing.  Diabetes screening. This is done by checking your blood sugar (glucose) after you have not eaten for a while (fasting). You may have this done every 1-3 years.  Abdominal aortic aneurysm (AAA) screening. You may need this if you are a current or former smoker.  Osteoporosis. You may be screened starting at age 61 if you are at high risk. Talk with your health care provider about your test results, treatment options, and if necessary, the need for more tests. Vaccines  Your health care provider may recommend certain vaccines, such as:  Influenza vaccine. This is recommended every year.  Tetanus, diphtheria, and acellular pertussis (Tdap, Td) vaccine. You may need a Td booster every 10 years.  Zoster vaccine. You may need this after age 22.  Pneumococcal 13-valent conjugate (PCV13) vaccine. One dose is recommended after age 4.  Pneumococcal polysaccharide (PPSV23) vaccine. One  dose is recommended after age 45. Talk to your health care provider about which screenings and vaccines you need and how often you need them. This information is not intended to replace advice given to you by your health care provider. Make sure you discuss any questions you have with your health care provider. Document Released: 09/26/2015 Document Revised: 05/19/2016 Document Reviewed: 07/01/2015 Elsevier Interactive Patient Education  2017 [place] Prevention in the Home Falls can cause injuries. They can happen to people of all ages. There are many things you can do to make your home safe and to help prevent falls. What can I do on the outside of my home?  Regularly fix the edges of walkways and driveways and fix any cracks.  Remove anything that might make you trip as you walk through a door, such as a raised step or threshold.  Trim any bushes or trees on the path to your home.  Use bright outdoor lighting.  Clear any walking paths of anything that might make someone trip, such as rocks or tools.  Regularly check to see if handrails are loose or broken. Make sure that both sides of any steps have handrails.  Any raised decks and porches should have guardrails on the edges.  Have any leaves, snow, or ice cleared regularly.  Use sand or salt on walking paths during winter.  Clean up any spills in your garage right away. This includes oil or grease spills. What can I do in the bathroom?  Use night lights.  Install grab bars by the toilet and in the tub and shower. Do not use towel bars as grab bars.  Use non-skid mats or decals in the tub or shower.  If you need to sit down in the shower, use a plastic, non-slip stool.  Keep the floor dry. Clean up any water that spills on the floor as soon as it happens.  Remove soap buildup in the tub or shower regularly.  Attach bath mats securely with double-sided non-slip rug tape.  Do not have throw rugs and other  things on the floor that can make you trip. What can I do in the bedroom?  Use night lights.  Make sure that you have a light by your bed that is easy to reach.  Do not use any sheets or blankets that are too big for your bed. They should not hang down onto the floor.  Have a firm chair that has side arms. You can use this for support while you get dressed.  Do not have throw rugs and other things on the floor that can make you trip. What can I do in the kitchen?  Clean up any spills right away.  Avoid walking on wet floors.  Keep items that you use a lot in easy-to-reach places.  If you need to reach something above you, use a strong step stool that has a grab bar.  Keep electrical cords out of the way.  Do not use floor polish or wax that makes floors slippery. If you must use wax, use non-skid floor wax.  Do not have throw rugs and other things on the floor that can make you trip. What can I do  with my stairs?  Do not leave any items on the stairs.  Make sure that there are handrails on both sides of the stairs and use them. Fix handrails that are broken or loose. Make sure that handrails are as long as the stairways.  Check any carpeting to make sure that it is firmly attached to the stairs. Fix any carpet that is loose or worn.  Avoid having throw rugs at the top or bottom of the stairs. If you do have throw rugs, attach them to the floor with carpet tape.  Make sure that you have a light switch at the top of the stairs and the bottom of the stairs. If you do not have them, ask someone to add them for you. What else can I do to help prevent falls?  Wear shoes that:  Do not have high heels.  Have rubber bottoms.  Are comfortable and fit you well.  Are closed at the toe. Do not wear sandals.  If you use a stepladder:  Make sure that it is fully opened. Do not climb a closed stepladder.  Make sure that both sides of the stepladder are locked into place.  Ask  someone to hold it for you, if possible.  Clearly mark and make sure that you can see:  Any grab bars or handrails.  First and last steps.  Where the edge of each step is.  Use tools that help you move around (mobility aids) if they are needed. These include:  Canes.  Walkers.  Scooters.  Crutches.  Turn on the lights when you go into a dark area. Replace any light bulbs as soon as they burn out.  Set up your furniture so you have a clear path. Avoid moving your furniture around.  If any of your floors are uneven, fix them.  If there are any pets around you, be aware of where they are.  Review your medicines with your doctor. Some medicines can make you feel dizzy. This can increase your chance of falling. Ask your doctor what other things that you can do to help prevent falls. This information is not intended to replace advice given to you by your health care provider. Make sure you discuss any questions you have with your health care provider. Document Released: 06/26/2009 Document Revised: 02/05/2016 Document Reviewed: 10/04/2014 Elsevier Interactive Patient Education  2017 [employer].

## 2018-06-26 ENCOUNTER — Ambulatory Visit (HOSPITAL_COMMUNITY)
Admission: RE | Admit: 2018-06-26 | Discharge: 2018-06-26 | Disposition: A | Discharge status: Home / Self Care | Payer: Medicare Other | Source: Ambulatory Visit | Attending: Cardiology | Admitting: Cardiology

## 2018-06-26 DIAGNOSIS — I352 Nonrheumatic aortic (valve) stenosis with insufficiency: Secondary | ICD-10-CM | POA: Diagnosis not present

## 2018-06-26 DIAGNOSIS — I69959 Hemiplegia and hemiparesis following unspecified cerebrovascular disease affecting unspecified side: Secondary | ICD-10-CM | POA: Diagnosis not present

## 2018-06-26 DIAGNOSIS — Z6837 Body mass index (BMI) 37.0-37.9, adult: Secondary | ICD-10-CM | POA: Insufficient documentation

## 2018-06-26 DIAGNOSIS — R7303 Prediabetes: Secondary | ICD-10-CM | POA: Insufficient documentation

## 2018-06-26 DIAGNOSIS — E785 Hyperlipidemia, unspecified: Secondary | ICD-10-CM | POA: Insufficient documentation

## 2018-06-26 DIAGNOSIS — G4730 Sleep apnea, unspecified: Secondary | ICD-10-CM | POA: Insufficient documentation

## 2018-06-26 DIAGNOSIS — I119 Hypertensive heart disease without heart failure: Secondary | ICD-10-CM | POA: Diagnosis not present

## 2018-06-26 DIAGNOSIS — I350 Nonrheumatic aortic (valve) stenosis: Secondary | ICD-10-CM | POA: Diagnosis not present

## 2018-06-26 NOTE — Progress Notes (Signed)
*  PRELIMINARY RESULTS* Echocardiogram 2D Echocardiogram has been performed.  Samuel Germany 06/26/2018, 11:25 AM

## 2018-07-06 ENCOUNTER — Encounter (HOSPITAL_COMMUNITY): Payer: Self-pay

## 2018-07-06 ENCOUNTER — Emergency Department (HOSPITAL_COMMUNITY)
Admission: EM | Admit: 2018-07-06 | Discharge: 2018-07-06 | Disposition: A | Discharge status: Home / Self Care | Payer: Medicare Other | Attending: Emergency Medicine | Admitting: Emergency Medicine

## 2018-07-06 ENCOUNTER — Encounter: Payer: Self-pay | Admitting: *Deleted

## 2018-07-06 ENCOUNTER — Other Ambulatory Visit: Payer: Self-pay

## 2018-07-06 DIAGNOSIS — Z8673 Personal history of transient ischemic attack (TIA), and cerebral infarction without residual deficits: Secondary | ICD-10-CM | POA: Insufficient documentation

## 2018-07-06 DIAGNOSIS — I2510 Atherosclerotic heart disease of native coronary artery without angina pectoris: Secondary | ICD-10-CM | POA: Diagnosis not present

## 2018-07-06 DIAGNOSIS — M5432 Sciatica, left side: Secondary | ICD-10-CM | POA: Diagnosis not present

## 2018-07-06 DIAGNOSIS — Z79899 Other long term (current) drug therapy: Secondary | ICD-10-CM | POA: Insufficient documentation

## 2018-07-06 DIAGNOSIS — Z951 Presence of aortocoronary bypass graft: Secondary | ICD-10-CM | POA: Insufficient documentation

## 2018-07-06 DIAGNOSIS — Z85038 Personal history of other malignant neoplasm of large intestine: Secondary | ICD-10-CM | POA: Diagnosis not present

## 2018-07-06 DIAGNOSIS — Z7902 Long term (current) use of antithrombotics/antiplatelets: Secondary | ICD-10-CM | POA: Diagnosis not present

## 2018-07-06 DIAGNOSIS — I10 Essential (primary) hypertension: Secondary | ICD-10-CM | POA: Insufficient documentation

## 2018-07-06 DIAGNOSIS — Z87891 Personal history of nicotine dependence: Secondary | ICD-10-CM | POA: Diagnosis not present

## 2018-07-06 DIAGNOSIS — M545 Low back pain: Secondary | ICD-10-CM | POA: Diagnosis present

## 2018-07-06 DIAGNOSIS — M5442 Lumbago with sciatica, left side: Secondary | ICD-10-CM | POA: Diagnosis not present

## 2018-07-06 MED ORDER — HYDROMORPHONE HCL 1 MG/ML IJ SOLN
1.0000 mg | Freq: Once | INTRAMUSCULAR | Status: AC
  Administered 2018-07-06: 1 mg via INTRAMUSCULAR
  Filled 2018-07-06: qty 1

## 2018-07-06 MED ORDER — DEXAMETHASONE SODIUM PHOSPHATE 4 MG/ML IJ SOLN
8.0000 mg | Freq: Once | INTRAMUSCULAR | Status: AC
  Administered 2018-07-06: 8 mg via INTRAMUSCULAR
  Filled 2018-07-06: qty 2

## 2018-07-06 MED ORDER — OXYCODONE-ACETAMINOPHEN 5-325 MG PO TABS: 1.0000 | ORAL_TABLET | Freq: Four times a day (QID) | ORAL | 0 refills | Status: DC | PRN

## 2018-07-06 MED ORDER — MELOXICAM 7.5 MG PO TABS: 7.5000 mg | ORAL_TABLET | Freq: Every day | ORAL | 0 refills | Status: DC

## 2018-07-06 MED ORDER — KETOROLAC TROMETHAMINE 60 MG/2ML IM SOLN
15.0000 mg | Freq: Once | INTRAMUSCULAR | Status: AC
  Administered 2018-07-06: 15 mg via INTRAMUSCULAR
  Filled 2018-07-06: qty 2

## 2018-07-06 NOTE — ED Triage Notes (Signed)
Pt reports pain left side that has been hurting for approx a week and getting worse. Hurts worse with movement

## 2018-07-06 NOTE — ED Provider Notes (Signed)
[facility] EMERGENCY DEPARTMENT Provider Note   CSN: [id number] Arrival date & time: 07/06/18  0908     History   Chief Complaint Chief Complaint  Patient presents with  . Back Pain    HPI Craig Fields is a 77 y.o. male.  HPI   77 year old male with left lower back pain.  Onset about a week ago.  Initially waxed and waned.  Is pretty constant.  Worse with movement.  He kept him up from sleeping last night.  Sometimes radiation to his posterior leg into his foot.  Some recent trauma.  No fevers or chills.  He has poor function on his left side secondary to a prior stroke.  Past Medical History:  Diagnosis Date  . Anxiety disorder   . Chronic back pain   . Colon cancer [facility]) May 2007  . Coronary atherosclerosis of native coronary artery    Multivessel status post CABG  . 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 Essential hypertension, benign   . Hyperlipidemia   . IGT (impaired glucose tolerance)   . Obesity   . OSA (obstructive sleep apnea)   . Seizures ([place])    Age 5 or 3, no meds and no reoccurances  . Stroke [facility])     Patient Active Problem List   Diagnosis Date Noted  . Recurrent incisional hernia   . Left leg weakness 02/28/2013  . Difficulty walking 02/28/2013  . Skin lesion 12/07/2012  . Skin tag 11/16/2011  . Prediabetes 07/19/2011  . 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 Hyperlipidemia LDL goal <70 02/21/2008  . Morbid obesity due to excess calories ([place]) 02/21/2008  . Essential hypertension, benign 02/21/2008  . Coronary atherosclerosis of native coronary artery 02/21/2008  . SLEEP APNEA 02/21/2008    Past Surgical History:  Procedure Laterality Date  . COLONOSCOPY N/A 07/15/2015   Procedure: COLONOSCOPY;  Surgeon: Aviva Signs, MD;  Location: [facility] ENDO SUITE;  Service: Gastroenterology;  Laterality: N/A;  . CORONARY ARTERY BYPASS GRAFT  2004   x4  . INCISIONAL HERNIA REPAIR N/A 01/01/2013   Procedure: HERNIA REPAIR INCISIONAL;  Surgeon: Jamesetta So, MD;  Location: [facility] ORS;  Service: General;  Laterality: N/A;  umbilical area  . INSERTION OF MESH N/A 01/01/2013   Procedure: INSERTION OF MESH;  Surgeon: Jamesetta So, MD;  Location: [facility] ORS;  Service: General;  Laterality: N/A;  . Left inguinal  hernia repair  2005  . [name]  2007        Home Medications    Prior to Admission medications   Medication Sig Start Date End Date Taking? Authorizing Provider  acetaminophen (TYLENOL 8 HOUR ARTHRITIS PAIN) 650 MG CR tablet Take by mouth.     [provider]  acetaminophen (TYLENOL) 500 MG tablet Take 1,000 mg by mouth every 6 (six) hours as needed for mild pain or moderate pain.    [provider]  aspirin (ASPIRIN LOW DOSE) 81 MG EC tablet Take 81 mg by mouth every evening.     [provider]  Cholecalciferol (VITAMIN D) 2000 units CAPS Take 1 capsule by mouth daily.    [provider]  clopidogrel (PLAVIX) 75 MG tablet TAKE 1 TABLET (75 MG TOTAL) BY MOUTH DAILY. 03/20/18   Fayrene Helper, MD  clotrimazole-betamethasone (LOTRISONE) cream APPLY TO AFFECTED AREA TWICE A DAY 03/21/17   Fayrene Helper, MD  Coenzyme Q10 (CO Q-10) 200 MG  CAPS Take 1 tablet by mouth daily.     [provider]  KLOR-CON M20 20 MEQ tablet TAKE 2 TABLETS (40 MEQ TOTAL) BY MOUTH DAILY. 03/20/18   Fayrene Helper, MD  lisinopril (PRINIVIL,ZESTRIL) 20 MG tablet TAKE 1/2 TABLET BY MOUTH DAILY. Patient taking differently: Take 10 mg by mouth daily.  02/24/15   Fayrene Helper, MD  metoprolol tartrate (LOPRESSOR) 50 MG tablet TAKE 1/2 TABLET BY MOUTH 2 (TWO) TIMES DAILY. Patient taking differently: Take 25 mg by mouth 2 (two) times daily.  03/20/18   Fayrene Helper, MD  Multiple Vitamins-Minerals ([employer] SPECTRAVITE) TABS Take 1 tablet by mouth every evening.     [provider]  Omega-3 Fatty Acids (FISH OIL) 1200 MG CAPS Take 2 capsules  by mouth at bedtime. 1200 mg    [provider]  polyethylene glycol powder (GLYCOLAX/MIRALAX) powder MIX TO TAKE 17 G DAILY AS DIRECTED Patient taking differently: Take 17 g by mouth every morning.  11/26/16   Fayrene Helper, MD  simvastatin (ZOCOR) 40 MG tablet Take 1 tablet (40 mg total) by mouth at bedtime. 05/16/18   Fayrene Helper, MD  sodium chloride (OCEAN) 0.65 % SOLN nasal spray Place 1 spray into both nostrils as needed for congestion.    [provider]  traMADol (ULTRAM) 50 MG tablet Take 1 tablet (50 mg total) by mouth every 6 (six) hours as needed. 05/11/18   Virgel Manifold, MD  triamterene-hydrochlorothiazide [facility]) 37.5-25 MG tablet TAKE 1 TABLET BY MOUTH EVERY MORNING 05/29/18   Fayrene Helper, MD  UNABLE TO FIND Bilateral shoes and calliber plate (left) Dx H41.740 04/27/17   Fayrene Helper, MD    Family History Family History  Problem Relation Age of Onset  . Pancreatic cancer Mother   . Heart disease Father   . Prostate cancer Father   . Stroke Brother   . Heart disease Brother   . Heart disease Brother   . COPD Sister   . Actinic keratosis Sister   . Obesity Son   . Anxiety disorder Son     Social History Social History   Tobacco Use  . Smoking status: Former Smoker    Packs/day: 0.50    Years: 5.00    Pack years: 2.50    Types: Cigarettes    Last attempt to quit: 12/27/1980    Years since quitting: 37.5  . Smokeless tobacco: Former Systems developer    Types: Chew    Quit date: 01/24/2013  Substance Use Topics  . Alcohol use: No    Alcohol/week: 0.0 standard drinks  . Drug use: No     Allergies   Bee venom and Niacin   Review of Systems Review of Systems  All systems reviewed and negative, other than as noted in HPI.  Physical Exam Updated Vital Signs BP (!) 142/48 (BP Location: Right Arm)   Pulse 62   Temp 97.8 F (36.6 C) (Oral)   Resp 20   Wt 114.3 kg   SpO2 97%   BMI 37.21 kg/m   Physical Exam    Constitutional: He appears well-developed and well-nourished. No distress.  HENT:  Head: Normocephalic and atraumatic.  Eyes: Conjunctivae are normal. Right eye exhibits no discharge. Left eye exhibits no discharge.  Neck: Neck supple.  Cardiovascular: Normal rate, regular rhythm and normal heart sounds. Exam reveals no gallop and no friction rub.  No murmur heard. Pulmonary/Chest: Effort normal and breath sounds normal. No respiratory distress.  Abdominal: Soft. He exhibits no distension. There is no tenderness.  Musculoskeletal:  Back normal to inspection.  Pain is not reproducible with palpation.  Positive straight leg test.  Swelling of the left lower extremity.  Poor movement of left lower extremity with increased tone.  Neurological: He is alert.  Skin: Skin is warm and dry.  Psychiatric: He has a normal mood and affect. His behavior is normal. Thought content normal.  Nursing note and vitals reviewed.    ED Treatments / Results  Labs (all labs ordered are listed, but only abnormal results are displayed) Labs Reviewed - No data to display  EKG None  Radiology No results found.  Procedures Procedures (including critical care time)  Medications Ordered in ED Medications - No data to display   Initial Impression / Assessment and Plan / ED Course  I have reviewed the triage vital signs and the nursing notes.  Pertinent labs & imaging results that were available during my care of the patient were reviewed by me and considered in my medical decision making (see chart for details).     77 year old male with radicular left lower back pain.  Likely sciatica.  Chronic left-sided deficits.  No acute neurological complaints.  Treated symptomatically with improvement.  Plan continued symptomatic treatment.  Return precautions were discussed.  Final Clinical Impressions(s) / ED Diagnoses   Final diagnoses:  Sciatica, left side    ED Discharge Orders    None        Virgel Manifold, MD 07/06/18 1103

## 2018-07-06 NOTE — Progress Notes (Signed)
Cardiology Office Note  Date: 07/07/2018   ID: Craig, Fields [birth year], MRN [id number]  PCP: Fayrene Helper, MD  Primary Cardiologist: Rozann Lesches, MD   Chief Complaint  Patient presents with  . Coronary Artery Disease  . Aortic Stenosis    History of Present Illness: Craig Fields is a 77 y.o. male last seen in October 2018.  He is here for a routine follow-up visit with family members.  He does not report any active angina symptoms at this time on medical therapy.  Has been getting around reasonably well with his post stroke deficits using a cane, although has had some trouble with left-sided sciatica just recently.  Recent follow-up echocardiogram in October revealed mild LVH with moderate septal hypertrophy, LVEF 60 to 02%, grade 1 diastolic dysfunction, and stable moderate aortic stenosis with mild aortic regurgitation.  I reviewed the results with him today and we will continue with observation for now.  We went over his cardiac medications today which include aspirin, Plavix, lisinopril, Lopressor, and omega-3 supplements.  He did stop Zocor recently wondering whether his left hip pain was related prior to diagnosis of sciatica.  He plans to resume.  I reviewed his recent lipid panel.  I personally reviewed his ECG today which shows sinus bradycardia with increased voltage and repolarization abnormalities.  Past Medical History:  Diagnosis Date  . Anxiety disorder   . Chronic back pain   . Colon cancer [facility]) May 2007  . Coronary atherosclerosis of native coronary artery    Multivessel status post CABG  . 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 [place] [facility]) May 2003   Left side   . Essential hypertension, benign   . Hyperlipidemia   . IGT (impaired glucose tolerance)   . Obesity   . OSA (obstructive sleep apnea)   . Seizures ([place])    Age 72 or 3, no meds and no reoccurances  . Stroke [facility])     Past Surgical History:  Procedure Laterality Date  . COLONOSCOPY N/A 07/15/2015     Procedure: COLONOSCOPY;  Surgeon: Aviva Signs, MD;  Location: [facility] ENDO SUITE;  Service: Gastroenterology;  Laterality: N/A;  . CORONARY ARTERY BYPASS GRAFT  2004   x4  . INCISIONAL HERNIA REPAIR N/A 01/01/2013   Procedure: HERNIA REPAIR INCISIONAL;  Surgeon: Jamesetta So, MD;  Location: [facility] ORS;  Service: General;  Laterality: N/A;  umbilical area  . INSERTION OF MESH N/A 01/01/2013   Procedure: INSERTION OF MESH;  Surgeon: Jamesetta So, MD;  Location: [facility] ORS;  Service: General;  Laterality: N/A;  . Left inguinal  hernia repair  2005  . [name]  2007    Current Outpatient Medications  Medication Sig Dispense Refill  . acetaminophen (TYLENOL 8 HOUR ARTHRITIS PAIN) 650 MG CR tablet Take by mouth.     Marland Kitchen acetaminophen (TYLENOL) 500 MG tablet Take 1,000 mg by mouth every 6 (six) hours as needed for mild pain or moderate pain.    Marland Kitchen aspirin (ASPIRIN LOW DOSE) 81 MG EC tablet Take 81 mg by mouth every evening.     . Cholecalciferol (VITAMIN D) 2000 units CAPS Take 1 capsule by mouth daily.    . clopidogrel (PLAVIX) 75 MG tablet TAKE 1 TABLET (75 MG TOTAL) BY MOUTH DAILY. 90 tablet 1  . clotrimazole-betamethasone (LOTRISONE) cream APPLY TO AFFECTED AREA TWICE A DAY 45 g 1  . Coenzyme Q10 (CO Q-10) 200 MG CAPS Take 1 tablet by mouth daily.     Marland Kitchen  KLOR-CON M20 20 MEQ tablet TAKE 2 TABLETS (40 MEQ TOTAL) BY MOUTH DAILY. 180 tablet 1  . lisinopril (PRINIVIL,ZESTRIL) 20 MG tablet TAKE 1/2 TABLET BY MOUTH DAILY. (Patient taking differently: Take 10 mg by mouth daily. ) 30 tablet 4  . meloxicam (MOBIC) 7.5 MG tablet Take 1 tablet (7.5 mg total) by mouth daily. 7 tablet 0  . metoprolol tartrate (LOPRESSOR) 50 MG tablet TAKE 1/2 TABLET BY MOUTH 2 (TWO) TIMES DAILY. (Patient taking differently: Take 25 mg by mouth 2 (two) times daily. ) 90 tablet 1  . Multiple Vitamins-Minerals ([employer] SPECTRAVITE) TABS Take 1 tablet by mouth every evening.     . Omega-3 Fatty Acids (FISH OIL) 1200 MG CAPS Take 2  capsules by mouth at bedtime. 1200 mg    . oxyCODONE-acetaminophen (PERCOCET/ROXICET) 5-325 MG tablet Take 1 tablet by mouth every 6 (six) hours as needed for severe pain. 12 tablet 0  . polyethylene glycol powder (GLYCOLAX/MIRALAX) powder MIX TO TAKE 17 G DAILY AS DIRECTED (Patient taking differently: Take 17 g by mouth every morning. ) 527 g 1  . simvastatin (ZOCOR) 40 MG tablet Take 1 tablet (40 mg total) by mouth at bedtime. 90 tablet 3  . sodium chloride (OCEAN) 0.65 % SOLN nasal spray Place 1 spray into both nostrils as needed for congestion.    . traMADol (ULTRAM) 50 MG tablet Take 1 tablet (50 mg total) by mouth every 6 (six) hours as needed. 12 tablet 0  . triamterene-hydrochlorothiazide (MAXZIDE-25) 37.5-25 MG tablet TAKE 1 TABLET BY MOUTH EVERY MORNING 90 tablet 1  . UNABLE TO FIND Bilateral shoes and calliber plate (left) Dx T41.962 1 each PRN   No current facility-administered medications for this visit.    Allergies:  Bee venom and Niacin   Social History: The patient  reports that he quit smoking about 37 years ago. His smoking use included cigarettes. He has a 2.50 pack-year smoking history. He quit smokeless tobacco use about 5 years ago.  His smokeless tobacco use included chew. He reports that he does not drink alcohol or use drugs.   ROS:  Please see the history of present illness. Otherwise, complete review of systems is positive for left hip pain.  All other systems are reviewed and negative.   Physical Exam: VS:  BP 132/74 (BP Location: Right Arm)   Pulse 67   Ht 5\' 9"  (1.753 m)   Wt 254 lb (115.2 kg)   SpO2 97%   BMI 37.51 kg/m , BMI Body mass index is 37.51 kg/m.  Wt Readings from Last 3 Encounters:  07/07/18 254 lb (115.2 kg)  07/06/18 251 lb 15.8 oz (114.3 kg)  06/14/18 252 lb (114.3 kg)    General: Elderly male, appears comfortable at rest.  Using a cane. HEENT: Conjunctiva and lids normal, oropharynx clear. Neck: Supple, no elevated JVP or carotid  bruits, no thyromegaly. Lungs: Clear to auscultation, nonlabored breathing at rest. Cardiac: Regular rate and rhythm, no S3, 2/6 systolic murmur. Abdomen: Soft, nontender, bowel sounds present. Extremities: Brace on left leg, distal pulses 1-2+. Skin: Warm and dry. Musculoskeletal: No kyphosis. Neuropsychiatric: Alert and oriented x3, affect grossly appropriate.  ECG: I personally reviewed the tracing from 11/25/2016 which showed sinus rhythm with possible left atrial enlargement and leftward axis.  Recent Labwork: 05/02/2018: TSH 3.07 05/11/2018: ALT 19; AST 21; BUN 19; Creatinine, Ser 1.01; Hemoglobin 14.8; Platelets 183; Potassium 4.4; Sodium 136     Component Value Date/Time   CHOL 155 05/02/2018 1039  TRIG 188 (H) 05/02/2018 1039   HDL 36 (L) 05/02/2018 1039   CHOLHDL 4.3 05/02/2018 1039   VLDL 38 (H) 04/19/2017 0914   LDLCALC 91 05/02/2018 1039    Other Studies Reviewed Today:  Echocardiogram 06/26/2018: Study Conclusions  - Left ventricle: The cavity size was normal. There was mild   concentric and moderate proximal septal hypertrophy. Systolic   function was normal. The estimated ejection fraction was in the   range of 60% to 65%. Wall motion was normal; there were no   regional wall motion abnormalities. Doppler parameters are   consistent with abnormal left ventricular relaxation (grade 1   diastolic dysfunction). Doppler parameters are consistent with   high ventricular filling pressure. - Aortic valve: Moderately calcified annulus. Trileaflet;   moderately calcified leaflets. There was moderate stenosis. There   was mild regurgitation. Peak velocity (S): 301 cm/s. Mean   gradient (S): 18 mm Hg. Valve area (VTI): 1.4 cm^2. Valve area   (Vmax): 1.17 cm^2. Valve area (Vmean): 1.27 cm^2. - Mitral valve: Moderately thickened annulus. - Right ventricle: Systolic function was mildly reduced. - Atrial septum: There was increased thickness of the septum,   consistent  with lipomatous hypertrophy. No defect or patent   foramen ovale was identified.  Assessment and Plan:  1.  CAD status post CABG in 2004.  We will continue with observation in the absence of angina on medical therapy.  I reviewed his ECG.  Plan to continue present medications.  2.  Moderate calcific aortic stenosis, stable by recent follow-up echocardiogram.  We will obtain a follow-up study in 1 year.  3.  Mixed hyperlipidemia.  He plans to resume Zocor at 40 mg daily.  Last LDL was 91.  Keep follow-up with Dr. Moshe Cipro.  4.  Essential hypertension, blood pressure is adequately controlled today.  Current medicines were reviewed with the patient today.   Orders Placed This Encounter  Procedures  . EKG 12-Lead  . ECHOCARDIOGRAM COMPLETE    Disposition: Follow-up in 1 year.  Signed, Satira Sark, MD, [facility] 07/07/2018 12:43 PM    [place] [facility] [address], [place], [place] [address] Phone: [phone]; Fax: [phone]

## 2018-07-07 ENCOUNTER — Encounter: Payer: Self-pay | Admitting: Cardiology

## 2018-07-07 ENCOUNTER — Ambulatory Visit (INDEPENDENT_AMBULATORY_CARE_PROVIDER_SITE_OTHER): Payer: Medicare Other | Admitting: Cardiology

## 2018-07-07 VITALS — BP 132/74 | HR 67 | Ht 69.0 in | Wt 254.0 lb

## 2018-07-07 DIAGNOSIS — I10 Essential (primary) hypertension: Secondary | ICD-10-CM

## 2018-07-07 DIAGNOSIS — I2510 Atherosclerotic heart disease of native coronary artery without angina pectoris: Secondary | ICD-10-CM | POA: Diagnosis not present

## 2018-07-07 DIAGNOSIS — E782 Mixed hyperlipidemia: Secondary | ICD-10-CM

## 2018-07-07 DIAGNOSIS — I350 Nonrheumatic aortic (valve) stenosis: Secondary | ICD-10-CM | POA: Diagnosis not present

## 2018-07-07 MED ORDER — SIMVASTATIN 40 MG PO TABS: 40.0000 mg | ORAL_TABLET | Freq: Every day | ORAL | 3 refills | Status: DC

## 2018-07-07 NOTE — Patient Instructions (Signed)
Your physician wants you to follow-up in: 1 year with Dr.McDowell You will receive a reminder letter in the mail two months in advance. If you don't receive a letter, please call our office to schedule the follow-up appointment.    Start Zocor 40 mg at dinner   Your physician has requested that you have an echocardiogram(Just Before next visit). Echocardiography is a painless test that uses sound waves to create images of your heart. It provides your doctor with information about the size and shape of your heart and how well your heart's chambers and valves are working. This procedure takes approximately one hour. There are no restrictions for this procedure.    If you need a refill on your cardiac medications before your next appointment, please call your pharmacy.     No lab work      Thank you for [place] !

## 2018-07-11 DIAGNOSIS — B351 Tinea unguium: Secondary | ICD-10-CM | POA: Diagnosis not present

## 2018-07-11 DIAGNOSIS — L851 Acquired keratosis [keratoderma] palmaris et plantaris: Secondary | ICD-10-CM | POA: Diagnosis not present

## 2018-07-11 DIAGNOSIS — I739 Peripheral vascular disease, unspecified: Secondary | ICD-10-CM | POA: Diagnosis not present

## 2018-08-11 ENCOUNTER — Other Ambulatory Visit: Payer: Self-pay | Admitting: Family Medicine

## 2018-08-15 DIAGNOSIS — M79675 Pain in left toe(s): Secondary | ICD-10-CM | POA: Diagnosis not present

## 2018-08-29 ENCOUNTER — Other Ambulatory Visit: Payer: Self-pay | Admitting: Family Medicine

## 2018-09-26 DIAGNOSIS — I739 Peripheral vascular disease, unspecified: Secondary | ICD-10-CM | POA: Diagnosis not present

## 2018-09-26 DIAGNOSIS — L851 Acquired keratosis [keratoderma] palmaris et plantaris: Secondary | ICD-10-CM | POA: Diagnosis not present

## 2018-09-26 DIAGNOSIS — B351 Tinea unguium: Secondary | ICD-10-CM | POA: Diagnosis not present

## 2018-10-17 ENCOUNTER — Ambulatory Visit (INDEPENDENT_AMBULATORY_CARE_PROVIDER_SITE_OTHER): Payer: Medicare Other | Admitting: Family Medicine

## 2018-10-17 ENCOUNTER — Encounter: Payer: Self-pay | Admitting: Family Medicine

## 2018-10-17 VITALS — BP 122/74 | HR 75 | Resp 15 | Ht 69.0 in | Wt 251.0 lb

## 2018-10-17 DIAGNOSIS — R7303 Prediabetes: Secondary | ICD-10-CM | POA: Diagnosis not present

## 2018-10-17 DIAGNOSIS — I69954 Hemiplegia and hemiparesis following unspecified cerebrovascular disease affecting left non-dominant side: Secondary | ICD-10-CM

## 2018-10-17 DIAGNOSIS — E785 Hyperlipidemia, unspecified: Secondary | ICD-10-CM

## 2018-10-17 DIAGNOSIS — M545 Low back pain, unspecified: Secondary | ICD-10-CM

## 2018-10-17 DIAGNOSIS — I10 Essential (primary) hypertension: Secondary | ICD-10-CM | POA: Diagnosis not present

## 2018-10-17 MED ORDER — LISINOPRIL 10 MG PO TABS: 10.0000 mg | ORAL_TABLET | Freq: Every day | ORAL | 3 refills | Status: DC

## 2018-10-17 MED ORDER — SIMVASTATIN 20 MG PO TABS: 20.0000 mg | ORAL_TABLET | Freq: Every day | ORAL | 3 refills | Status: DC

## 2018-10-17 NOTE — Patient Instructions (Signed)
F/U mid August call if you need  Me before  Medications are changed as we discussed  Please get labs today  Keep  The humor  Please be careful  not  To fall   Continue to eat smaller portions and more fruit and vegetable, continue to be "too lazy" to bake biscuits   Thank you  for choosing [facility]. We consider it a privelige to serve you.  Delivering excellent health care in a caring and  compassionate way is our goal.  Partnering with you,  so that together we can achieve this goal is our strategy.

## 2018-10-18 LAB — LIPID PANEL
Cholesterol: 145 mg/dL (ref <200)
HDL: 42 mg/dL (ref >=40)
LDL Cholesterol (Calc): 77 mg/dL (calc)
NON-HDL CHOLESTEROL (CALC): 103 mg/dL (ref <130)
Total CHOL/HDL Ratio: 3.5 (calc) (ref <5.0)
Triglycerides: 159 mg/dL — ABNORMAL HIGH (ref <150)

## 2018-10-18 LAB — COMPLETE METABOLIC PANEL WITH GFR
AG Ratio: 1.6 (calc) (ref 1.0–2.5)
ALKALINE PHOSPHATASE (APISO): 36 U/L (ref 35–144)
ALT: 17 U/L (ref 9–46)
AST: 23 U/L (ref 10–35)
Albumin: 4.2 g/dL (ref 3.6–5.1)
BILIRUBIN TOTAL: 0.6 mg/dL (ref 0.2–1.2)
BUN: 18 mg/dL (ref 7–25)
CALCIUM: 9.9 mg/dL (ref 8.6–10.3)
CHLORIDE: 102 mmol/L (ref 98–110)
CO2: 27 mmol/L (ref 20–32)
Creat: 1.01 mg/dL (ref 0.70–1.18)
GFR, Est African American: 82 mL/min/{1.73_m2} (ref >=60)
GFR, Est Non African American: 70 mL/min/{1.73_m2} (ref >=60)
GLOBULIN: 2.7 g/dL (ref 1.9–3.7)
Glucose, Bld: 105 mg/dL — ABNORMAL HIGH (ref 65–99)
Potassium: 4.2 mmol/L (ref 3.5–5.3)
SODIUM: 139 mmol/L (ref 135–146)
Total Protein: 6.9 g/dL (ref 6.1–8.1)

## 2018-10-18 LAB — HEMOGLOBIN A1C
EAG (MMOL/L): 7 (calc)
Hgb A1c MFr Bld: 6 % of total Hgb — ABNORMAL HIGH (ref <5.7)
MEAN PLASMA GLUCOSE: 126 (calc)

## 2018-10-22 ENCOUNTER — Encounter: Payer: Self-pay | Admitting: Family Medicine

## 2018-10-22 NOTE — Assessment & Plan Note (Signed)
Hyperlipidemia:Low fat diet discussed and encouraged.   Lipid Panel  Lab Results  Component Value Date   CHOL 145 10/17/2018   HDL 42 10/17/2018   LDLCALC 77 10/17/2018   TRIG 159 (H) 10/17/2018   CHOLHDL 3.5 10/17/2018  needs to reduce fayt in diet, due to elevated TG, also LDL goal is 70 or less

## 2018-10-22 NOTE — Assessment & Plan Note (Signed)
Patient educated about the importance of limiting  Carbohydrate intake , the need to commit to daily physical activity for a minimum of 30 minutes , and to commit weight loss. The fact that changes in all these areas will reduce or eliminate all together the development of diabetes is stressed.  Unchanged  Diabetic Labs Latest Ref Rng & Units 10/17/2018 05/11/2018 05/02/2018 10/27/2017 04/19/2017  HbA1c <5.7 % of total Hgb 6.0(H) - 6.0(H) 6.3(H) 6.0(H)  Chol <200 mg/dL 145 - 155 155 154  HDL > OR = 40 mg/dL 42 - 36(L) 42 41  Calc LDL mg/dL (calc) 77 - 91 86 75  Triglycerides <150 mg/dL 159(H) - 188(H) 168(H) 192(H)  Creatinine 0.70 - 1.18 mg/dL 1.01 1.01 1.01 1.15 1.02   BP/Weight 10/17/2018 07/07/2018 07/06/2018 06/14/2018 05/16/2018 05/11/2018 10/03/6242  Systolic BP 695 072 257 505 183 358 251  Diastolic BP 74 74 62 78 60 60 78  Wt. (Lbs) 251 254 251.99 252 256.08 - 282  BMI 37.07 37.51 37.21 37.21 37.82 - 41.64   No flowsheet data found.

## 2018-10-22 NOTE — Assessment & Plan Note (Signed)
Controlled, no change in medication DASH diet and commitment to daily physical activity for a minimum of 30 minutes discussed and encouraged, as a part of hypertension management. The importance of attaining a healthy weight is also discussed.  BP/Weight 10/17/2018 07/07/2018 07/06/2018 06/14/2018 05/16/2018 05/11/2018 0/15/8682  Systolic BP 574 935 521 747 159 539 672  Diastolic BP 74 74 62 78 60 60 78  Wt. (Lbs) 251 254 251.99 252 256.08 - 282  BMI 37.07 37.51 37.21 37.21 37.82 - 41.64

## 2018-10-22 NOTE — Assessment & Plan Note (Signed)
Obesity linked with hypertension and arthritis, and prediabetes Improved Patient re-educated about  the importance of commitment to a  minimum of 150 minutes of exercise per week as able.  The importance of healthy food choices with portion control discussed, as well as eating regularly and within a 12 hour window most days. The need to choose "clean , green" food 50 to 75% of the time is discussed, as well as to make water the primary drink and set a goal of 64 ounces water daily.  Encouraged to start a food diary,  and to consider  joining a support group. Sample diet sheets offered. Goals set by the patient for the next several months.   Weight /BMI 10/17/2018 07/07/2018 07/06/2018  WEIGHT 251 lb 254 lb 251 lb 15.8 oz  HEIGHT 5\' 9"  5\' 9"  -  BMI 37.07 kg/m2 37.51 kg/m2 37.21 kg/m2

## 2018-10-22 NOTE — Assessment & Plan Note (Signed)
Pt at increased fall risk due to hemiplegia, fall precautions reviewed

## 2018-10-22 NOTE — Progress Notes (Signed)
Craig Fields     MRN: [id number]      DOB: December 21, 1940   HPI [name] is here for follow up and re-evaluation of chronic medical conditions, medication management and review of any available recent lab and radiology data.  Preventive health is updated, specifically  Cancer screening and Immunization.   Questions or concerns regarding consultations or procedures which the PT has had in the interim are  addressed. The PT denies any adverse reactions to current medications since the last visit.  1 day h/o increased low back pain after sitting in a car for approximately 4 hours   ROS Denies recent fever or chills. Denies sinus pressure, nasal congestion, ear pain or sore throat. Denies chest congestion, productive cough or wheezing. Denies chest pains, palpitations and leg swelling Denies abdominal pain, nausea, vomiting,diarrhea or constipation.   Denies dysuria, frequency, hesitancy or incontinence. Denies headaches, seizures, numbness, or tingling. Denies depression, anxiety or insomnia. Denies skin break down or rash.   PE  BP 122/74   Pulse 75   Resp 15   Ht 5\' 9"  (1.753 m)   Wt 251 lb (113.9 kg)   SpO2 95%   BMI 37.07 kg/m   Patient alert and oriented and in no cardiopulmonary distress.  HEENT: No facial asymmetry, EOMI,   oropharynx pink and moist.  Neck supple no JVD, no mass.  Chest: Clear to auscultation bilaterally.  CVS: S1, S2 no murmurs, no S3.Regular rate.  ABD: Soft non tender.   Ext: No edema  MS: decreased  ROM spine, shoulders, hips and knees.  Skin: Intact, no ulcerations or rash noted.  Psych: Good eye contact, normal affect. Memory intact not anxious or depressed appearing.  CNS: CN 2-12 intact, power,  normal throughout.no focal deficits noted.   Assessment & Plan  Essential hypertension, benign Controlled, no change in medication DASH diet and commitment to daily physical activity for a minimum of 30 minutes discussed and encouraged, as  a part of hypertension management. The importance of attaining a healthy weight is also discussed.  BP/Weight 10/17/2018 07/07/2018 07/06/2018 06/14/2018 05/16/2018 05/11/2018 7/34/2876  Systolic BP 811 572 620 355 974 163 845  Diastolic BP 74 74 62 78 60 60 78  Wt. 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  The importance of healthy food choices with portion control discussed, as well as eating regularly and within a 12 hour window most days. The need to choose "clean , green" food 50 to 75% of the time is discussed, as well as to make water the primary drink and set a goal of 64 ounces water daily.  Encouraged to start a food diary,  and to consider  joining a support group. Sample diet sheets offered. Goals set by the patient for the next several months.   Weight /BMI 10/17/2018 07/07/2018 07/06/2018  WEIGHT 251 lb 254 lb 251 lb 15.8 oz  HEIGHT 5\' 9"  5\' 9"  -  BMI 37.07 kg/m2 37.51 kg/m2 37.21 kg/m2      Hyperlipidemia LDL goal <70 Hyperlipidemia:Low fat diet discussed and encouraged.   Lipid Panel  Lab Results  Component Value Date   CHOL 145 10/17/2018   HDL 42  10/17/2018   LDLCALC 77 10/17/2018   TRIG 159 (H) 10/17/2018   CHOLHDL 3.5 10/17/2018  needs to reduce fayt in diet, due to elevated TG, also LDL goal is 70 or less     Hemiplegia, late effect of cerebrovascular disease (Craig Fields) Pt at increased fall risk due to hemiplegia, fall precautions reviewed  Prediabetes Patient educated about the importance of limiting  Carbohydrate intake , the need to commit to daily physical activity for a minimum of 30 minutes , and to commit weight loss. The fact that changes in all these areas will reduce or  eliminate all together the development of diabetes is stressed.  Unchanged  Diabetic Labs Latest Ref Rng & Units 10/17/2018 05/11/2018 05/02/2018 10/27/2017 04/19/2017  HbA1c <5.7 % of total Hgb 6.0(H) - 6.0(H) 6.3(H) 6.0(H)  Chol <200 mg/dL 145 - 155 155 154  HDL > OR = 40 mg/dL 42 - 36(L) 42 41  Calc LDL mg/dL (calc) 77 - 91 86 75  Triglycerides <150 mg/dL 159(H) - 188(H) 168(H) 192(H)  Creatinine 0.70 - 1.18 mg/dL 1.01 1.01 1.01 1.15 1.02   BP/Weight 10/17/2018 07/07/2018 07/06/2018 06/14/2018 05/16/2018 05/11/2018 0/97/3532  Systolic BP 992 426 834 196 222 979 892  Diastolic BP 74 74 62 78 60 60 78  Wt. (Lbs) 251 254 251.99 252 256.08 - 282  BMI 37.07 37.51 37.21 37.21 37.82 - 41.64   No flowsheet data found.    Back pain Increased x 2 4 hours due to prolonged sitting in a restricted space 1 day ago, re educated re need to constantly change position

## 2018-10-22 NOTE — Assessment & Plan Note (Signed)
Increased x 2 4 hours due to prolonged sitting in a restricted space 1 day ago, re educated re need to constantly change position

## 2018-11-24 ENCOUNTER — Other Ambulatory Visit: Payer: Self-pay | Admitting: Family Medicine

## 2018-12-08 DIAGNOSIS — B351 Tinea unguium: Secondary | ICD-10-CM | POA: Diagnosis not present

## 2018-12-08 DIAGNOSIS — I739 Peripheral vascular disease, unspecified: Secondary | ICD-10-CM | POA: Diagnosis not present

## 2018-12-08 DIAGNOSIS — L851 Acquired keratosis [keratoderma] palmaris et plantaris: Secondary | ICD-10-CM | POA: Diagnosis not present

## 2019-01-02 ENCOUNTER — Telehealth: Payer: Self-pay | Admitting: Family Medicine

## 2019-01-02 ENCOUNTER — Telehealth: Payer: Self-pay

## 2019-01-02 MED ORDER — UNABLE TO FIND: 0 refills | Status: DC

## 2019-01-02 NOTE — Telephone Encounter (Signed)
Script entered for commode assist chair. Faxed to [facility]

## 2019-01-02 NOTE — Telephone Encounter (Signed)
PT is calling and needs a commode assist chair, his broke.  [employer]

## 2019-01-02 NOTE — Telephone Encounter (Signed)
Commode assist chair script entered and faxed to [facility]

## 2019-01-24 ENCOUNTER — Encounter: Payer: Self-pay | Admitting: *Deleted

## 2019-02-16 DIAGNOSIS — I739 Peripheral vascular disease, unspecified: Secondary | ICD-10-CM | POA: Diagnosis not present

## 2019-02-16 DIAGNOSIS — L851 Acquired keratosis [keratoderma] palmaris et plantaris: Secondary | ICD-10-CM | POA: Diagnosis not present

## 2019-02-16 DIAGNOSIS — B351 Tinea unguium: Secondary | ICD-10-CM | POA: Diagnosis not present

## 2019-02-25 ENCOUNTER — Other Ambulatory Visit: Payer: Self-pay | Admitting: Family Medicine

## 2019-04-26 ENCOUNTER — Other Ambulatory Visit: Payer: Self-pay

## 2019-04-26 ENCOUNTER — Encounter: Payer: Self-pay | Admitting: Family Medicine

## 2019-04-26 ENCOUNTER — Ambulatory Visit (INDEPENDENT_AMBULATORY_CARE_PROVIDER_SITE_OTHER): Payer: Medicare Other | Admitting: Family Medicine

## 2019-04-26 VITALS — BP 122/74 | Ht 69.0 in | Wt 240.0 lb

## 2019-04-26 DIAGNOSIS — N3001 Acute cystitis with hematuria: Secondary | ICD-10-CM

## 2019-04-26 DIAGNOSIS — R7301 Impaired fasting glucose: Secondary | ICD-10-CM

## 2019-04-26 DIAGNOSIS — Z125 Encounter for screening for malignant neoplasm of prostate: Secondary | ICD-10-CM

## 2019-04-26 DIAGNOSIS — I10 Essential (primary) hypertension: Secondary | ICD-10-CM

## 2019-04-26 DIAGNOSIS — E785 Hyperlipidemia, unspecified: Secondary | ICD-10-CM | POA: Diagnosis not present

## 2019-04-26 DIAGNOSIS — R319 Hematuria, unspecified: Secondary | ICD-10-CM

## 2019-04-26 NOTE — Progress Notes (Signed)
Virtual Visit via Telephone Note  I connected with Ayad Nieman Eustice on 04/26/19 at  1:20 PM EDT by telephone and verified that I am speaking with the correct person using two identifiers. This visit type is conducted due to national recommendations for restrictions regarding the COVID -19 Pandemic. Due to the patient's age and / or co morbidities, this format is felt to be most appropriate at this time without adequate follow up. The patient has no access to video technology/ had technical difficulties with video, requiring transitioning to audio format  only ( telephone ). All issues noted this document were discussed and addressed,no physical exam can be performed in this format.   Location: Patient: home Provider: office   I discussed the limitations, risks, security and privacy concerns of performing an evaluation and management service by telephone and the availability of in person appointments. I also discussed with the patient that there may be a patient responsible charge related to this service. The patient expressed understanding and agreed to proceed.   History of Present Illness: Hematuria 3 episodes in past 2 days Prior to this has been doing well, feels as though overeating esp biscuits Denies recent fever or chills. Denies sinus pressure, nasal congestion, ear pain or sore throat. Denies chest congestion, productive cough or wheezing. Denies chest pains, palpitations and leg swelling Denies abdominal pain, nausea, vomiting,diarrhea or constipation.    chronic joint pain, swelling and limitation in mobility. Denies headaches, seizures, numbness, or tingling. Denies depression, anxiety or insomnia. Denies skin break down or rash.       Observations/Objective: BP 122/74   Wt 240 lb (108.9 kg)   BMI 35.44 kg/m   Assessment and Plan:  Essential hypertension, benign Controlled, no change in medication   Hyperlipidemia LDL goal <70 Hyperlipidemia:Low fat diet  discussed and encouraged.   Lipid Panel  Lab Results  Component Value Date   CHOL 145 10/17/2018   HDL 42 10/17/2018   LDLCALC 77 10/17/2018   TRIG 159 (H) 10/17/2018   CHOLHDL 3.5 10/17/2018   Not at goal , uncontrolled ,  Needs to lower fat intake Updated lab needed at/ before next visit.    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  The importance of healthy food choices with portion control discussed, as well as eating regularly and within a 12 hour window most days. The need to choose "clean , green" food 50 to 75% of the time is discussed, as well as to make water the primary drink and set a goal of 64 ounces water daily.    Weight /BMI 04/26/2019 10/17/2018 07/07/2018  WEIGHT 240 lb 251 lb 254 lb  HEIGHT - 5\' 9"  5\' 9"   BMI 35.44 kg/m2 37.07 kg/m2 37.51 kg/m2        Follow Up Instructions:    I discussed the assessment and treatment plan with the patient. The patient was provided an opportunity to ask questions and all were answered. The patient agreed with the plan and demonstrated an understanding of the instructions.   The patient was advised to call back or seek an in-person evaluation if the symptoms worsen or if the condition fails to improve as anticipated.  I provided 21  minutes of non-face-to-face time during this encounter.   Tula Nakayama, MD

## 2019-04-26 NOTE — Patient Instructions (Addendum)
Virtual/ tele visit with MD in mid December, call if you need me before  Appt for flu vaccine in next 2 to 3 weeks  Needs fasting cbc, lipid, cmp and eGFR, tSH and pSA  8/22 or after same day as flu vaccine ( [facility]) if possible  Please leave urine specimen cup for pt to have collected tomorrow am, order CCUA and reflex c/s , has 2 day h/y hematuria  Please continue social distancing, mask wearing and hand washing  Be careful not to  Fall!  Thanks for choosing [facility], we consider it a privelige to serve you.

## 2019-04-27 DIAGNOSIS — L851 Acquired keratosis [keratoderma] palmaris et plantaris: Secondary | ICD-10-CM | POA: Diagnosis not present

## 2019-04-27 DIAGNOSIS — B351 Tinea unguium: Secondary | ICD-10-CM | POA: Diagnosis not present

## 2019-04-27 DIAGNOSIS — I739 Peripheral vascular disease, unspecified: Secondary | ICD-10-CM | POA: Diagnosis not present

## 2019-04-30 ENCOUNTER — Encounter: Payer: Self-pay | Admitting: Family Medicine

## 2019-04-30 ENCOUNTER — Other Ambulatory Visit: Payer: Self-pay | Admitting: Family Medicine

## 2019-04-30 ENCOUNTER — Other Ambulatory Visit (HOSPITAL_COMMUNITY)
Admission: RE | Admit: 2019-04-30 | Discharge: 2019-04-30 | Disposition: A | Discharge status: Home / Self Care | Payer: Medicare Other | Source: Ambulatory Visit | Attending: Family Medicine | Admitting: Family Medicine

## 2019-04-30 DIAGNOSIS — R319 Hematuria, unspecified: Secondary | ICD-10-CM

## 2019-04-30 DIAGNOSIS — N3001 Acute cystitis with hematuria: Secondary | ICD-10-CM | POA: Insufficient documentation

## 2019-04-30 DIAGNOSIS — Z87448 Personal history of other diseases of urinary system: Secondary | ICD-10-CM

## 2019-04-30 HISTORY — DX: Hematuria, unspecified: R31.9

## 2019-04-30 LAB — URINALYSIS, ROUTINE W REFLEX MICROSCOPIC
Bilirubin Urine: NEGATIVE
Glucose, UA: NEGATIVE mg/dL
Hgb urine dipstick: NEGATIVE
Ketones, ur: NEGATIVE mg/dL
Leukocytes,Ua: NEGATIVE
Nitrite: NEGATIVE
Protein, ur: NEGATIVE mg/dL
Specific Gravity, Urine: 1.025 (ref 1.005–1.030)
pH: 5 (ref 5.0–8.0)

## 2019-04-30 NOTE — Assessment & Plan Note (Signed)
Hyperlipidemia:Low fat diet discussed and encouraged.   Lipid Panel  Lab Results  Component Value Date   CHOL 145 10/17/2018   HDL 42 10/17/2018   LDLCALC 77 10/17/2018   TRIG 159 (H) 10/17/2018   CHOLHDL 3.5 10/17/2018   Not at goal , uncontrolled ,  Needs to lower fat intake Updated lab needed at/ before next visit.

## 2019-04-30 NOTE — Assessment & Plan Note (Signed)
Controlled, no change in medication  

## 2019-04-30 NOTE — Assessment & Plan Note (Signed)
Obesity associated with hypertension and hyperlipidemia  Patient re-educated about  the importance of commitment to a  minimum of 150 minutes of exercise per week as able.  The importance of healthy food choices with portion control discussed, as well as eating regularly and within a 12 hour window most days. The need to choose "clean , green" food 50 to 75% of the time is discussed, as well as to make water the primary drink and set a goal of 64 ounces water daily.    Weight /BMI 04/26/2019 10/17/2018 07/07/2018  WEIGHT 240 lb 251 lb 254 lb  HEIGHT - 5\' 9"  5\' 9"   BMI 35.44 kg/m2 37.07 kg/m2 37.51 kg/m2

## 2019-05-01 LAB — URINE CULTURE: Culture: NO GROWTH

## 2019-05-14 ENCOUNTER — Other Ambulatory Visit: Payer: Self-pay

## 2019-05-14 ENCOUNTER — Ambulatory Visit: Payer: Medicare Other

## 2019-05-15 ENCOUNTER — Ambulatory Visit (INDEPENDENT_AMBULATORY_CARE_PROVIDER_SITE_OTHER): Payer: Medicare Other

## 2019-05-15 ENCOUNTER — Other Ambulatory Visit: Payer: Self-pay | Admitting: Family Medicine

## 2019-05-15 ENCOUNTER — Other Ambulatory Visit: Payer: Self-pay

## 2019-05-15 DIAGNOSIS — I10 Essential (primary) hypertension: Secondary | ICD-10-CM | POA: Diagnosis not present

## 2019-05-15 DIAGNOSIS — E785 Hyperlipidemia, unspecified: Secondary | ICD-10-CM | POA: Diagnosis not present

## 2019-05-15 DIAGNOSIS — L97522 Non-pressure chronic ulcer of other part of left foot with fat layer exposed: Secondary | ICD-10-CM | POA: Diagnosis not present

## 2019-05-15 DIAGNOSIS — R7301 Impaired fasting glucose: Secondary | ICD-10-CM | POA: Diagnosis not present

## 2019-05-15 DIAGNOSIS — Z23 Encounter for immunization: Secondary | ICD-10-CM

## 2019-05-15 DIAGNOSIS — Z125 Encounter for screening for malignant neoplasm of prostate: Secondary | ICD-10-CM | POA: Diagnosis not present

## 2019-05-18 LAB — PSA: PSA: 3.5 ng/mL (ref <=4.0)

## 2019-05-18 LAB — LIPID PANEL
Cholesterol: 149 mg/dL (ref <200)
HDL: 42 mg/dL (ref >=40)
LDL Cholesterol (Calc): 86 mg/dL (calc)
Non-HDL Cholesterol (Calc): 107 mg/dL (calc) (ref <130)
Total CHOL/HDL Ratio: 3.5 (calc) (ref <5.0)
Triglycerides: 116 mg/dL (ref <150)

## 2019-05-18 LAB — CBC
HCT: 42.8 % (ref 38.5–50.0)
Hemoglobin: 14.6 g/dL (ref 13.2–17.1)
MCH: 32.2 pg (ref 27.0–33.0)
MCHC: 34.1 g/dL (ref 32.0–36.0)
MCV: 94.3 fL (ref 80.0–100.0)
MPV: 9.4 fL (ref 7.5–12.5)
Platelets: 204 10*3/uL (ref 140–400)
RBC: 4.54 10*6/uL (ref 4.20–5.80)
RDW: 13 % (ref 11.0–15.0)
WBC: 7.1 10*3/uL (ref 3.8–10.8)

## 2019-05-18 LAB — COMPLETE METABOLIC PANEL WITH GFR
AG Ratio: 1.6 (calc) (ref 1.0–2.5)
ALT: 17 U/L (ref 9–46)
AST: 26 U/L (ref 10–35)
Albumin: 4.4 g/dL (ref 3.6–5.1)
Alkaline phosphatase (APISO): 35 U/L (ref 35–144)
BUN/Creatinine Ratio: 25 (calc) — ABNORMAL HIGH (ref 6–22)
BUN: 27 mg/dL — ABNORMAL HIGH (ref 7–25)
CO2: 27 mmol/L (ref 20–32)
Calcium: 10 mg/dL (ref 8.6–10.3)
Chloride: 103 mmol/L (ref 98–110)
Creat: 1.06 mg/dL (ref 0.70–1.11)
GFR, Est African American: 76 mL/min/{1.73_m2} (ref >=60)
GFR, Est Non African American: 66 mL/min/{1.73_m2} (ref >=60)
Globulin: 2.8 g/dL (calc) (ref 1.9–3.7)
Glucose, Bld: 125 mg/dL — ABNORMAL HIGH (ref 65–99)
Potassium: 4.6 mmol/L (ref 3.5–5.3)
Sodium: 139 mmol/L (ref 135–146)
Total Bilirubin: 0.5 mg/dL (ref 0.2–1.2)
Total Protein: 7.2 g/dL (ref 6.1–8.1)

## 2019-05-18 LAB — TSH: TSH: 1.65 mIU/L (ref 0.40–4.50)

## 2019-05-18 LAB — TEST AUTHORIZATION

## 2019-05-18 LAB — HEMOGLOBIN A1C W/OUT EAG: Hgb A1c MFr Bld: 5.7 % of total Hgb — ABNORMAL HIGH (ref <5.7)

## 2019-05-29 DIAGNOSIS — L97522 Non-pressure chronic ulcer of other part of left foot with fat layer exposed: Secondary | ICD-10-CM | POA: Diagnosis not present

## 2019-05-30 NOTE — Addendum Note (Signed)
Addended by: Eual Fines on: 05/30/2019 03:48 PM   Modules accepted: Orders

## 2019-06-18 ENCOUNTER — Ambulatory Visit: Payer: Medicare Other

## 2019-06-19 ENCOUNTER — Ambulatory Visit: Payer: Medicare Other | Admitting: Family Medicine

## 2019-06-19 DIAGNOSIS — M2042 Other hammer toe(s) (acquired), left foot: Secondary | ICD-10-CM | POA: Diagnosis not present

## 2019-06-19 DIAGNOSIS — M79675 Pain in left toe(s): Secondary | ICD-10-CM | POA: Diagnosis not present

## 2019-06-20 ENCOUNTER — Encounter: Payer: Self-pay | Admitting: Family Medicine

## 2019-06-20 ENCOUNTER — Other Ambulatory Visit: Payer: Self-pay

## 2019-06-20 ENCOUNTER — Ambulatory Visit (INDEPENDENT_AMBULATORY_CARE_PROVIDER_SITE_OTHER): Payer: Medicare Other | Admitting: Family Medicine

## 2019-06-20 VITALS — BP 122/74 | HR 75 | Resp 15 | Ht 69.0 in | Wt 240.0 lb

## 2019-06-20 DIAGNOSIS — Z0000 Encounter for general adult medical examination without abnormal findings: Secondary | ICD-10-CM

## 2019-06-20 NOTE — Progress Notes (Signed)
Subjective:   Craig Fields is a 78 y.o. male who presents for Medicare Annual/Subsequent preventive examination.  Location of Patient: Home Location of Provider: Telehealth Consent was obtain for visit to be over via telehealth. I verified that I am speaking with the correct person using two identifiers.   Review of Systems:    Cardiac Risk Factors include: advanced age (>60men, >64 women);dyslipidemia;hypertension;obesity (BMI >30kg/m2)     Objective:    Vitals: BP 122/74   Pulse 75   Resp 15   Ht 5\' 9"  (1.753 m)   Wt 240 lb (108.9 kg)   BMI 35.44 kg/m   Body mass index is 35.44 kg/m.  Advanced Directives 06/14/2018 05/11/2018 06/13/2017 11/24/2016 07/15/2015 12/05/2013 01/01/2013  Does Patient Have a Medical Advance Directive? No No No No No Patient does not have advance directive;Patient would like information Patient does not have advance directive;Patient would not like information  Would patient like information on creating a medical advance directive? Yes (ED - Information included in AVS) - Yes (MAU/Ambulatory/Procedural Areas - Information given) No - Patient declined No - patient declined information Advance directive packet given -  Pre-existing out of facility DNR order (yellow form or pink MOST form) - - - - - - No    Tobacco Social History   Tobacco Use  Smoking Status Former Smoker  . Packs/day: 0.50  . Years: 5.00  . Pack years: 2.50  . Types: Cigarettes  . Quit date: 12/27/1980  . Years since quitting: 38.5  Smokeless Tobacco Former Systems developer  . Types: Chew  . Quit date: 01/24/2013     Counseling given: Yes   Clinical Intake:  Pre-visit preparation completed: Yes  Pain : No/denies pain Pain Score: 0-No pain     BMI - recorded: 35.44 Nutritional Status: BMI > 30  Obese Nutritional Risks: None Diabetes: No  How often do you need to have someone help you when you read instructions, pamphlets, or other written materials from your doctor or pharmacy?:  1 - Never What is the last grade level you completed in school?: 12  Interpreter Needed?: No     Past Medical History:  Diagnosis Date  . Anxiety disorder   . Chronic back pain   . Colon cancer [facility]) May 2007  . Coronary atherosclerosis of native coronary artery    Multivessel status post CABG  . 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 Essential hypertension, benign   . Hyperlipidemia   . IGT (impaired glucose tolerance)   . Obesity   . OSA (obstructive sleep apnea)   . Seizures ([place])    Age 57 or 3, no meds and no reoccurances  . Stroke [facility])    Past Surgical History:  Procedure Laterality Date  . COLONOSCOPY N/A 07/15/2015   Procedure: COLONOSCOPY;  Surgeon: Aviva Signs, MD;  Location: [facility] ENDO SUITE;  Service: Gastroenterology;  Laterality: N/A;  . CORONARY ARTERY BYPASS GRAFT  2004   x4  . INCISIONAL HERNIA REPAIR N/A 01/01/2013   Procedure: HERNIA REPAIR INCISIONAL;  Surgeon: Jamesetta So, MD;  Location: [facility] ORS;  Service: General;  Laterality: N/A;  umbilical area  . INSERTION OF MESH N/A 01/01/2013   Procedure: INSERTION OF MESH;  Surgeon: Jamesetta So, MD;  Location: [facility] ORS;  Service: General;  Laterality: N/A;  . Left inguinal  hernia repair  2005  . [name]  2007   Family History  Problem Relation Age of Onset  .  Pancreatic cancer Mother   . Heart disease Father   . Prostate cancer Father   . Stroke Brother   . Heart disease Brother   . Heart disease Brother   . COPD Sister   . Actinic keratosis Sister   . Obesity Son   . Anxiety disorder Son    Social History   Socioeconomic History  . Marital status: Married    Spouse name: Not on file  . Number of children: 2  . Years of education: Not on file  . Highest education level: Not on file  Occupational History  . Occupation: retired   Scientific laboratory technician  . Financial resource strain: Not hard at all  . Food insecurity    Worry: Never true    Inability: Never true  .  Transportation needs    Medical: No    Non-medical: No  Tobacco Use  . Smoking status: Former Smoker    Packs/day: 0.50    Years: 5.00    Pack years: 2.50    Types: Cigarettes    Quit date: 12/27/1980    Years since quitting: 38.5  . Smokeless tobacco: Former Systems developer    Types: [place] date: 01/24/2013  Substance and Sexual Activity  . Alcohol use: No    Alcohol/week: 0.0 standard drinks  . Drug use: No  . Sexual activity: Not Currently    Birth control/protection: None  Lifestyle  . Physical activity    Days per week: 0 days    Minutes per session: 0 min  . Stress: Not at all  Relationships  . Social Herbalist on phone: Once a week    Gets together: Once a week    Attends religious service: Never    Active member of club or organization: No    Attends meetings of clubs or organizations: Never    Relationship status: Married  Other Topics Concern  . Not on file  Social History Narrative  . Not on file    Outpatient Encounter Medications as of 06/20/2019  Medication Sig  . acetaminophen (TYLENOL 8 HOUR ARTHRITIS PAIN) 650 MG CR tablet Take by mouth.   Marland Kitchen aspirin (ASPIRIN LOW DOSE) 81 MG EC tablet Take 81 mg by mouth every evening.   . Cholecalciferol (VITAMIN D) 2000 units CAPS Take 1 capsule by mouth daily.  . clopidogrel (PLAVIX) 75 MG tablet TAKE 1 TABLET BY MOUTH EVERY DAY  . clotrimazole-betamethasone (LOTRISONE) cream APPLY TO AFFECTED AREA TWICE A DAY  . Coenzyme Q10 (CO Q-10) 200 MG CAPS Take 1 tablet by mouth daily.   Marland Kitchen KLOR-CON M20 20 MEQ tablet TAKE 2 TABLETS BY MOUTH DAILY  . lisinopril (PRINIVIL,ZESTRIL) 10 MG tablet Take 1 tablet (10 mg total) by mouth daily.  . meloxicam (MOBIC) 7.5 MG tablet Take 1 tablet (7.5 mg total) by mouth daily.  . metoprolol tartrate (LOPRESSOR) 50 MG tablet TAKE 1/2 TABLET BY MOUTH 2 (TWO) TIMES DAILY.  . Multiple Vitamins-Minerals ([employer] SPECTRAVITE) TABS Take 1 tablet by mouth every evening.   . Omega-3 Fatty Acids  (FISH OIL) 1200 MG CAPS Take 2 capsules by mouth at bedtime. 1200 mg  . oxyCODONE-acetaminophen (PERCOCET/ROXICET) 5-325 MG tablet Take 1 tablet by mouth every 6 (six) hours as needed for severe pain.  . polyethylene glycol powder (GLYCOLAX/MIRALAX) powder MIX TO TAKE 17 G DAILY AS DIRECTED (Patient taking differently: Take 17 g by mouth every morning. )  . simvastatin (ZOCOR) 20 MG tablet Take 1  tablet (20 mg total) by mouth at bedtime.  . sodium chloride (OCEAN) 0.65 % SOLN nasal spray Place 1 spray into both nostrils as needed for congestion.  . traMADol (ULTRAM) 50 MG tablet Take 1 tablet (50 mg total) by mouth every 6 (six) hours as needed.  . triamterene-hydrochlorothiazide (MAXZIDE-25) 37.5-25 MG tablet TAKE 1 TABLET BY MOUTH EVERY DAY IN THE MORNING  . UNABLE TO FIND Bilateral shoes and calliber plate (left) Dx [id number]  . UNABLE TO FIND Commode assist chair   No facility-administered encounter medications on file as of 06/20/2019.     Activities of Daily Living In your present state of health, do you have any difficulty performing the following activities: 06/20/2019  Hearing? N  Vision? N  Difficulty concentrating or making decisions? Y  Comment sometimes  Walking or climbing stairs? Y  Dressing or bathing? Y  Doing errands, shopping? Y  Preparing Food and eating ? Y  Using the Toilet? N  In the past six months, have you accidently leaked urine? N  Do you have problems with loss of bowel control? N  Managing your Medications? N  Managing your Finances? N  Housekeeping or managing your Housekeeping? Y  Some recent data might be hidden    Patient Care Team: Fayrene Helper, MD as PCP - General Caprice Beaver, DPM as Consulting Physician (Podiatry) Satira Sark, MD as Consulting Physician (Cardiology) Aviva Signs, MD as Consulting Physician (General Surgery)   Assessment:   This is a routine wellness examination for Reyhan.  Exercise Activities and Dietary  recommendations Current Exercise Habits: The patient does not participate in regular exercise at present, Exercise limited by: orthopedic condition(s)  Goals    . Exercise 3x per week (30 min per time)     Recommend starting a routine exercise program at least 3 days a week for 30-45 minutes at a time as tolerated.        Fall Risk Fall Risk  06/20/2019 04/26/2019 10/23/2018 10/22/2018 10/17/2018  Falls in the past year? 1 1 1  0 0  Number falls in past yr: 1 0 1 0 0  Injury with Fall? 1 1 1  0 0  Comment - - - - -  Risk Factor Category  - - - - -  Risk for fall due to : - Impaired mobility Impaired balance/gait;Impaired mobility - Impaired balance/gait;Impaired mobility  Follow up - - - - -   Is the patient's home free of loose throw rugs in walkways, pet beds, electrical cords, etc?   yes      Grab bars in the bathroom? yes      Handrails on the stairs?   yes      Adequate lighting?   yes     Depression Screen PHQ 2/9 Scores 06/20/2019 04/26/2019 05/16/2018 06/13/2017  PHQ - 2 Score 0 0 0 0    Cognitive Function     6CIT Screen 06/20/2019 06/14/2018 06/13/2017  What Year? 0 points 0 points 0 points  What month? 0 points 0 points 0 points  What time? 0 points 0 points 0 points  Count back from 20 0 points 2 points 0 points  Months in reverse 0 points 0 points 0 points  Repeat phrase 0 points 0 points 0 points  Total Score 0 2 0    Immunization History  Administered Date(s) Administered  . Fluad Quad(high Dose 65+) 05/15/2019  . H1N1 08/27/2008, 10/16/2008  . Influenza Split 07/06/2011, 06/07/2012  . Influenza Whole  06/14/2007, 06/24/2009, 06/30/2010  . Influenza,inj,Quad PF,6+ Mos 06/18/2013, 06/18/2014, 06/02/2015, 06/10/2016, 06/13/2017, 05/16/2018  . Pneumococcal Conjugate-13 04/10/2014  . Pneumococcal Polysaccharide-23 01/04/2007  . Td 04/15/2004  . Tdap 08/15/2014  . Zoster 01/10/2007    Qualifies for Shingles Vaccine?  completed  Screening Tests Health Maintenance   Topic Date Due  . COLONOSCOPY  07/14/2020  . TETANUS/TDAP  08/15/2024  . INFLUENZA VACCINE  Completed  . PNA vac Low Risk Adult  Completed   Cancer Screenings: Lung: Low Dose CT Chest recommended if Age 3-80 years, 30 pack-year currently smoking OR have quit w/in 15years. Patient does not qualify. Colorectal:  N/A   Additional Screenings:   Hepatitis C Screening:      Plan:   1. Encounter for Medicare annual wellness exam  I have personally reviewed and noted the following in the patient's chart:   . Medical and social history . Use of alcohol, tobacco or illicit drugs  . Current medications and supplements . Functional ability and status . Nutritional status . Physical activity . Advanced directives . List of other physicians . Hospitalizations, surgeries, and ER visits in previous 12 months . Vitals . Screenings to include cognitive, depression, and falls . Referrals and appointments  In addition, I have reviewed and discussed with patient certain preventive protocols, quality metrics, and best practice recommendations. A written personalized care plan for preventive services as well as general preventive health recommendations were provided to patient.   I provided 20 minutes of non-face-to-face time during this encounter.   Perlie Mayo, NP  06/20/2019

## 2019-06-20 NOTE — Patient Instructions (Signed)
[name] , Thank you for taking time to come for your Medicare Wellness Visit. I appreciate your ongoing commitment to your health goals. Please review the following plan we discussed and let me know if I can assist you in the future.   Please continue to practice social distancing to keep you, your family, and our community safe.  If you must go out, please wear a Mask and practice good handwashing.  Screening recommendations/referrals: Colonoscopy: no longer need Recommended yearly ophthalmology/optometry visit for glaucoma screening and checkup Recommended yearly dental visit for hygiene and checkup  Vaccinations: Influenza vaccine: Completed Pneumococcal vaccine: Completed Tdap vaccine: Due 2025  Shingles vaccine: Completed    Advanced directives: reported you do not have one of these  Conditions/risks identified: Fall  Next appointment: 11/26/2019   Preventive Care 80 Years and Older, Male Preventive care refers to lifestyle choices and visits with your health care provider that can promote health and wellness. What does preventive care include?  A yearly physical exam. This is also called an annual well check.  Dental exams once or twice a year.  Routine eye exams. Ask your health care provider how often you should have your eyes checked.  Personal lifestyle choices, including:  Daily care of your teeth and gums.  Regular physical activity.  Eating a healthy diet.  Avoiding tobacco and drug use.  Limiting alcohol use.  Practicing safe sex.  Taking low doses of aspirin every day.  Taking vitamin and mineral supplements as recommended by your health care provider. What happens during an annual well check? The services and screenings done by your health care provider during your annual well check will depend on your age, overall health, lifestyle risk factors, and family history of disease. Counseling  Your health care provider may ask you questions about  your:  Alcohol use.  Tobacco use.  Drug use.  Emotional well-being.  Home and relationship well-being.  Sexual activity.  Eating habits.  History of falls.  Memory and ability to understand (cognition).  Work and work Statistician. Screening  You may have the following tests or measurements:  Height, weight, and BMI.  Blood pressure.  Lipid and cholesterol levels. These may be checked every 5 years, or more frequently if you are over 34 years old.  Skin check.  Lung cancer screening. You may have this screening every year starting at age 30 if you have a 30-pack-year history of smoking and currently smoke or have quit within the past 15 years.  Fecal occult blood test (FOBT) of the stool. You may have this test every year starting at age 30.  Flexible sigmoidoscopy or colonoscopy. You may have a sigmoidoscopy every 5 years or a colonoscopy every 10 years starting at age 59.  Prostate cancer screening. Recommendations will vary depending on your family history and other risks.  Hepatitis C blood test.  Hepatitis B blood test.  Sexually transmitted disease (STD) testing.  Diabetes screening. This is done by checking your blood sugar (glucose) after you have not eaten for a while (fasting). You may have this done every 1-3 years.  Abdominal aortic aneurysm (AAA) screening. You may need this if you are a current or former smoker.  Osteoporosis. You may be screened starting at age 23 if you are at high risk. Talk with your health care provider about your test results, treatment options, and if necessary, the need for more tests. Vaccines  Your health care provider may recommend certain vaccines, such as:  Influenza  vaccine. This is recommended every year.  Tetanus, diphtheria, and acellular pertussis (Tdap, Td) vaccine. You may need a Td booster every 10 years.  Zoster vaccine. You may need this after age 25.  Pneumococcal 13-valent conjugate (PCV13) vaccine.  One dose is recommended after age 76.  Pneumococcal polysaccharide (PPSV23) vaccine. One dose is recommended after age 43. Talk to your health care provider about which screenings and vaccines you need and how often you need them. This information is not intended to replace advice given to you by your health care provider. Make sure you discuss any questions you have with your health care provider. Document Released: 09/26/2015 Document Revised: 05/19/2016 Document Reviewed: 07/01/2015 Elsevier Interactive Patient Education  2017 [place] Prevention in the Home Falls can cause injuries. They can happen to people of all ages. There are many things you can do to make your home safe and to help prevent falls. What can I do on the outside of my home?  Regularly fix the edges of walkways and driveways and fix any cracks.  Remove anything that might make you trip as you walk through a door, such as a raised step or threshold.  Trim any bushes or trees on the path to your home.  Use bright outdoor lighting.  Clear any walking paths of anything that might make someone trip, such as rocks or tools.  Regularly check to see if handrails are loose or broken. Make sure that both sides of any steps have handrails.  Any raised decks and porches should have guardrails on the edges.  Have any leaves, snow, or ice cleared regularly.  Use sand or salt on walking paths during winter.  Clean up any spills in your garage right away. This includes oil or grease spills. What can I do in the bathroom?  Use night lights.  Install grab bars by the toilet and in the tub and shower. Do not use towel bars as grab bars.  Use non-skid mats or decals in the tub or shower.  If you need to sit down in the shower, use a plastic, non-slip stool.  Keep the floor dry. Clean up any water that spills on the floor as soon as it happens.  Remove soap buildup in the tub or shower regularly.  Attach bath  mats securely with double-sided non-slip rug tape.  Do not have throw rugs and other things on the floor that can make you trip. What can I do in the bedroom?  Use night lights.  Make sure that you have a light by your bed that is easy to reach.  Do not use any sheets or blankets that are too big for your bed. They should not hang down onto the floor.  Have a firm chair that has side arms. You can use this for support while you get dressed.  Do not have throw rugs and other things on the floor that can make you trip. What can I do in the kitchen?  Clean up any spills right away.  Avoid walking on wet floors.  Keep items that you use a lot in easy-to-reach places.  If you need to reach something above you, use a strong step stool that has a grab bar.  Keep electrical cords out of the way.  Do not use floor polish or wax that makes floors slippery. If you must use wax, use non-skid floor wax.  Do not have throw rugs and other things on the floor that can  make you trip. What can I do with my stairs?  Do not leave any items on the stairs.  Make sure that there are handrails on both sides of the stairs and use them. Fix handrails that are broken or loose. Make sure that handrails are as long as the stairways.  Check any carpeting to make sure that it is firmly attached to the stairs. Fix any carpet that is loose or worn.  Avoid having throw rugs at the top or bottom of the stairs. If you do have throw rugs, attach them to the floor with carpet tape.  Make sure that you have a light switch at the top of the stairs and the bottom of the stairs. If you do not have them, ask someone to add them for you. What else can I do to help prevent falls?  Wear shoes that:  Do not have high heels.  Have rubber bottoms.  Are comfortable and fit you well.  Are closed at the toe. Do not wear sandals.  If you use a stepladder:  Make sure that it is fully opened. Do not climb a closed  stepladder.  Make sure that both sides of the stepladder are locked into place.  Ask someone to hold it for you, if possible.  Clearly mark and make sure that you can see:  Any grab bars or handrails.  First and last steps.  Where the edge of each step is.  Use tools that help you move around (mobility aids) if they are needed. These include:  Canes.  Walkers.  Scooters.  Crutches.  Turn on the lights when you go into a dark area. Replace any light bulbs as soon as they burn out.  Set up your furniture so you have a clear path. Avoid moving your furniture around.  If any of your floors are uneven, fix them.  If there are any pets around you, be aware of where they are.  Review your medicines with your doctor. Some medicines can make you feel dizzy. This can increase your chance of falling. Ask your doctor what other things that you can do to help prevent falls. This information is not intended to replace advice given to you by your health care provider. Make sure you discuss any questions you have with your health care provider. Document Released: 06/26/2009 Document Revised: 02/05/2016 Document Reviewed: 10/04/2014 Elsevier Interactive Patient Education  2017 [employer].

## 2019-07-24 DIAGNOSIS — L97522 Non-pressure chronic ulcer of other part of left foot with fat layer exposed: Secondary | ICD-10-CM | POA: Diagnosis not present

## 2019-07-31 DIAGNOSIS — B351 Tinea unguium: Secondary | ICD-10-CM | POA: Diagnosis not present

## 2019-07-31 DIAGNOSIS — I739 Peripheral vascular disease, unspecified: Secondary | ICD-10-CM | POA: Diagnosis not present

## 2019-07-31 DIAGNOSIS — L851 Acquired keratosis [keratoderma] palmaris et plantaris: Secondary | ICD-10-CM | POA: Diagnosis not present

## 2019-08-28 ENCOUNTER — Other Ambulatory Visit: Payer: Self-pay | Admitting: Family Medicine

## 2019-08-31 ENCOUNTER — Other Ambulatory Visit: Payer: Self-pay | Admitting: Family Medicine

## 2019-09-11 DIAGNOSIS — I739 Peripheral vascular disease, unspecified: Secondary | ICD-10-CM | POA: Diagnosis not present

## 2019-09-11 DIAGNOSIS — L97522 Non-pressure chronic ulcer of other part of left foot with fat layer exposed: Secondary | ICD-10-CM | POA: Diagnosis not present

## 2019-09-28 DIAGNOSIS — Z23 Encounter for immunization: Secondary | ICD-10-CM | POA: Diagnosis not present

## 2019-10-26 DIAGNOSIS — Z23 Encounter for immunization: Secondary | ICD-10-CM | POA: Diagnosis not present

## 2019-11-07 ENCOUNTER — Other Ambulatory Visit: Payer: Self-pay

## 2019-11-07 MED ORDER — TRIAMTERENE-HCTZ 37.5-25 MG PO TABS: ORAL_TABLET | ORAL | 1 refills | Status: DC

## 2019-11-08 ENCOUNTER — Other Ambulatory Visit: Payer: Self-pay | Admitting: Family Medicine

## 2019-11-12 ENCOUNTER — Other Ambulatory Visit: Payer: Self-pay | Admitting: Family Medicine

## 2019-11-16 DIAGNOSIS — I739 Peripheral vascular disease, unspecified: Secondary | ICD-10-CM | POA: Diagnosis not present

## 2019-11-16 DIAGNOSIS — L851 Acquired keratosis [keratoderma] palmaris et plantaris: Secondary | ICD-10-CM | POA: Diagnosis not present

## 2019-11-16 DIAGNOSIS — B351 Tinea unguium: Secondary | ICD-10-CM | POA: Diagnosis not present

## 2019-11-21 ENCOUNTER — Other Ambulatory Visit: Payer: Self-pay

## 2019-11-21 ENCOUNTER — Telehealth: Payer: Self-pay

## 2019-11-21 MED ORDER — UNABLE TO FIND: 99 refills | Status: DC

## 2019-11-21 NOTE — Telephone Encounter (Signed)
Has to make appt per clinic

## 2019-11-21 NOTE — Telephone Encounter (Signed)
Needs a Shoe and brace replacement  Please send to [facility] Fax [phone]

## 2019-11-21 NOTE — Telephone Encounter (Signed)
Pt needs a new Brace\shoe for the Left side.  Per [facility] fax [phone], he will need a face to face visit,   Called Pt to advise and he will get in touch with his daughter and grandson   The Brace\shoe is AFO brace and shoe for the Left side.

## 2019-11-26 ENCOUNTER — Ambulatory Visit: Payer: Medicare Other | Admitting: Family Medicine

## 2019-12-12 ENCOUNTER — Telehealth: Payer: Self-pay

## 2019-12-12 NOTE — Telephone Encounter (Signed)
  Patient Consent for Virtual Visit         Craig Fields has provided verbal consent on 12/12/2019 for a virtual visit (video or telephone).   CONSENT FOR VIRTUAL VISIT FOR:  Craig Fields  By participating in this virtual visit I agree to the following:  I hereby voluntarily request, consent and authorize [facility] and its employed or contracted physicians, physician assistants, nurse practitioners or other licensed health care professionals (the Practitioner), to provide me with telemedicine health care services (the "Services") as deemed necessary by the treating Practitioner. I acknowledge and consent to receive the Services by the Practitioner via telemedicine. I understand that the telemedicine visit will involve communicating with the Practitioner through live audiovisual communication technology and the disclosure of certain medical information by electronic transmission. I acknowledge that I have been given the opportunity to request an in-person assessment or other available alternative prior to the telemedicine visit and am voluntarily participating in the telemedicine visit.  I understand that I have the right to withhold or withdraw my consent to the use of telemedicine in the course of my care at any time, without affecting my right to future care or treatment, and that the Practitioner or I may terminate the telemedicine visit at any time. I understand that I have the right to inspect all information obtained and/or recorded in the course of the telemedicine visit and may receive copies of available information for a reasonable fee.  I understand that some of the potential risks of receiving the Services via telemedicine include:  Marland Kitchen Delay or interruption in medical evaluation due to technological equipment failure or disruption; . Information transmitted may not be sufficient (e.g. poor resolution of images) to allow for appropriate medical decision making by the Practitioner;  and/or  . In rare instances, security protocols could fail, causing a breach of personal health information.  Furthermore, I acknowledge that it is my responsibility to provide information about my medical history, conditions and care that is complete and accurate to the best of my ability. I acknowledge that Practitioner's advice, recommendations, and/or decision may be based on factors not within their control, such as incomplete or inaccurate data provided by me or distortions of diagnostic images or specimens that may result from electronic transmissions. I understand that the practice of medicine is not an exact science and that Practitioner makes no warranties or guarantees regarding treatment outcomes. I acknowledge that a copy of this consent can be made available to me via my patient portal ([facility]), or I can request a printed copy by calling the office of [facility].    I understand that my insurance will be billed for this visit.   I have read or had this consent read to me. . I understand the contents of this consent, which adequately explains the benefits and risks of the Services being provided via telemedicine.  . I have been provided ample opportunity to ask questions regarding this consent and the Services and have had my questions answered to my satisfaction. . I give my informed consent for the services to be provided through the use of telemedicine in my medical care

## 2019-12-13 ENCOUNTER — Encounter: Payer: Self-pay | Admitting: Cardiology

## 2019-12-13 ENCOUNTER — Telehealth (INDEPENDENT_AMBULATORY_CARE_PROVIDER_SITE_OTHER): Payer: Medicare Other | Admitting: Cardiology

## 2019-12-13 DIAGNOSIS — I350 Nonrheumatic aortic (valve) stenosis: Secondary | ICD-10-CM

## 2019-12-13 DIAGNOSIS — E782 Mixed hyperlipidemia: Secondary | ICD-10-CM | POA: Diagnosis not present

## 2019-12-13 DIAGNOSIS — I25119 Atherosclerotic heart disease of native coronary artery with unspecified angina pectoris: Secondary | ICD-10-CM

## 2019-12-13 NOTE — Progress Notes (Signed)
Virtual Visit via Telephone Note   This visit type was conducted due to national recommendations for restrictions regarding the COVID-19 Pandemic (e.g. social distancing) in an effort to limit this patient's exposure and mitigate transmission in our community.  Due to his co-morbid illnesses, this patient is at least at moderate risk for complications without adequate follow up.  This format is felt to be most appropriate for this patient at this time.  The patient did not have access to video technology/had technical difficulties with video requiring transitioning to audio format only (telephone).  All issues noted in this document were discussed and addressed.  No physical exam could be performed with this format.  Please refer to the patient's chart for his  consent to telehealth for [facility].   The patient was identified using 2 identifiers.  Date:  12/13/2019   ID:  Craig Fields, Craig Fields 1941/01/25, MRN [id number]  Patient Location: Home Provider Location: Office  PCP:  Craig Helper, MD  Cardiologist:  Craig Lesches, MD Electrophysiologist:  None   Evaluation Performed:  Follow-Up Visit  Chief Complaint:  Cardiac follow-up  History of Present Illness:    Craig Fields is an 79 y.o. male last seen in October 2019.  We spoke by phone today.  He does not report any active angina at this time, still functional with ADLs.  No change in level of fatigue.  He has had no palpitations or syncope.  He is due for a follow-up echocardiogram for reevaluation of aortic stenosis.  I discussed this with him today.  His last study was in 2019 at which point he had moderate aortic stenosis with mean gradient 18 mmHg.  He continues to follow with Craig Fields.  I reviewed her most recent note and lab work as outlined below.  Cardiac regimen is stable.   Past Medical History:  Diagnosis Date  . Anxiety disorder   . Chronic back pain   . Colon cancer [facility]) May 2007  . Coronary  atherosclerosis of native coronary artery    Multivessel status post CABG  . 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 Essential hypertension   . Hyperlipidemia   . IGT (impaired glucose tolerance)   . Obesity   . OSA (obstructive sleep apnea)   . Seizures ([place])    Age 32 or 3, no meds and no reoccurances  . Stroke [facility])    Past Surgical History:  Procedure Laterality Date  . COLONOSCOPY N/A 07/15/2015   Procedure: COLONOSCOPY;  Surgeon: Craig Signs, MD;  Location: [facility] ENDO SUITE;  Service: Gastroenterology;  Laterality: N/A;  . CORONARY ARTERY BYPASS GRAFT  2004   x4  . INCISIONAL HERNIA REPAIR N/A 01/01/2013   Procedure: HERNIA REPAIR INCISIONAL;  Surgeon: Craig So, MD;  Location: [facility] ORS;  Service: General;  Laterality: N/A;  umbilical area  . INSERTION OF MESH N/A 01/01/2013   Procedure: INSERTION OF MESH;  Surgeon: Craig So, MD;  Location: [facility] ORS;  Service: General;  Laterality: N/A;  . Left inguinal  hernia repair  2005  . [name]  2007     Current Meds  Medication Sig  . acetaminophen (TYLENOL 8 HOUR ARTHRITIS PAIN) 650 MG CR tablet Take by mouth.   Marland Kitchen aspirin (ASPIRIN LOW DOSE) 81 MG EC tablet Take 81 mg by mouth every evening.   . Cholecalciferol (VITAMIN D) 2000 units CAPS Take 1 capsule by mouth daily.  . clopidogrel (  PLAVIX) 75 MG tablet TAKE 1 TABLET BY MOUTH EVERY DAY  . clotrimazole-betamethasone (LOTRISONE) cream APPLY TO AFFECTED AREA TWICE A DAY  . Coenzyme Q10 (CO Q-10) 200 MG CAPS Take 1 tablet by mouth daily.   Marland Kitchen KLOR-CON M20 20 MEQ tablet TAKE 2 TABLETS BY MOUTH DAILY  . lisinopril (ZESTRIL) 10 MG tablet TAKE 1 TABLET BY MOUTH EVERY DAY  . meloxicam (MOBIC) 7.5 MG tablet Take 1 tablet (7.5 mg total) by mouth daily.  . metoprolol tartrate (LOPRESSOR) 50 MG tablet TAKE 1/2 TABLET BY MOUTH 2 (TWO) TIMES DAILY.  . Multiple Vitamins-Minerals ([employer]S SPECTRAVITE) TABS Take 1 tablet by mouth every evening.   . Omega-3 Fatty Acids (FISH  OIL) 1200 MG CAPS Take 2 capsules by mouth at bedtime. 1200 mg  . oxyCODONE-acetaminophen (PERCOCET/ROXICET) 5-325 MG tablet Take 1 tablet by mouth every 6 (six) hours as needed for severe pain.  . polyethylene glycol powder (GLYCOLAX/MIRALAX) powder MIX TO TAKE 17 G DAILY AS DIRECTED (Patient taking differently: Take 17 g by mouth every morning. )  . simvastatin (ZOCOR) 20 MG tablet TAKE 1 TABLET BY MOUTH EVERYDAY AT BEDTIME  . sodium chloride (OCEAN) 0.65 % SOLN nasal spray Place 1 spray into both nostrils as needed for congestion.  . traMADol (ULTRAM) 50 MG tablet Take 1 tablet (50 mg total) by mouth every 6 (six) hours as needed.  . triamterene-hydrochlorothiazide (MAXZIDE-25) 37.5-25 MG tablet TAKE 1 TABLET BY MOUTH EVERY DAY IN THE MORNING  . UNABLE TO FIND Commode assist chair  . UNABLE TO FIND Bilateral shoes and calliber plate (left) Dx [id number]     Allergies:   Bee venom and Niacin   ROS:   Chronic foot pain with limited ambulation.  Prior CV studies:   The following studies were reviewed today:  Echocardiogram 06/26/2018: Study Conclusions   - Left ventricle: The cavity size was normal. There was mild  concentric and moderate proximal septal hypertrophy. Systolic  function was normal. The estimated ejection fraction was in the  range of 60% to 65%. Wall motion was normal; there were no  regional wall motion abnormalities. Doppler parameters are  consistent with abnormal left ventricular relaxation (grade 1  diastolic dysfunction). Doppler parameters are consistent with  high ventricular filling pressure.  - Aortic valve: Moderately calcified annulus. Trileaflet;  moderately calcified leaflets. There was moderate stenosis. There  was mild regurgitation. Peak velocity (S): 301 cm/s. Mean  gradient (S): 18 mm Hg. Valve area (VTI): 1.4 cm^2. Valve area  (Vmax): 1.17 cm^2. Valve area (Vmean): 1.27 cm^2.  - Mitral valve: Moderately thickened annulus.  -  Right ventricle: Systolic function was mildly reduced.  - Atrial septum: There was increased thickness of the septum,  consistent with lipomatous hypertrophy. No defect or patent  foramen ovale was identified.   Labs/Other Tests and Data Reviewed:    EKG:  An ECG dated 07/07/2018 was personally reviewed today and demonstrated:  Sinus bradycardia with increased voltage and repolarization abnormalities.  Recent Labs: 05/15/2019: ALT 17; BUN 27; Creat 1.06; Hemoglobin 14.6; Platelets 204; Potassium 4.6; Sodium 139; TSH 1.65   Recent Lipid Panel Lab Results  Component Value Date/Time   CHOL 149 05/15/2019 10:46 AM   TRIG 116 05/15/2019 10:46 AM   HDL 42 05/15/2019 10:46 AM   CHOLHDL 3.5 05/15/2019 10:46 AM   LDLCALC 86 05/15/2019 10:46 AM    Wt Readings from Last 3 Encounters:  12/13/19 230 lb (104.3 kg)  06/20/19 240 lb (108.9 kg)  04/26/19  240 lb (108.9 kg)     Objective:    Vital Fields:  BP (!) 154/77   Pulse 67   Ht 5\' 9"  (1.753 m)   Wt 230 lb (104.3 kg)   BMI 33.97 kg/m    Patient spoke in full sentences, not short of breath. No audible wheezing or coughing.  ASSESSMENT & PLAN:    1.  Multivessel CAD status post CABG in 2004.  He does not report any active angina at this time.  Continue medical therapy and observation including aspirin, Plavix, lisinopril, Lopressor, and Zocor.  2.  Aortic stenosis, moderate with mean gradient 18 mmHg by last echocardiogram in 2019.  Follow-up echocardiogram will be arranged.  3.  Mixed hyperlipidemia, currently on Zocor 20 mg daily.  Last LDL 86.   Time:   Today, I have spent 7 minutes with the patient with telehealth technology discussing the above problems.     Medication Adjustments/Labs and Tests Ordered: Current medicines are reviewed at length with the patient today.  Concerns regarding medicines are outlined above.   Tests Ordered: Orders Placed This Encounter  Procedures  . ECHOCARDIOGRAM COMPLETE     Medication Changes: No orders of the defined types were placed in this encounter.   Follow Up:  Test results and determine next office visit.  Signed, Craig Lesches, MD  12/13/2019 3:39 PM    [place]

## 2019-12-13 NOTE — Patient Instructions (Signed)
Medication Instructions:  Your physician recommends that you continue on your current medications as directed. Please refer to the Current Medication list given to you today.  *If you need a refill on your cardiac medications before your next appointment, please call your pharmacy*   Lab Work: None today  If you have labs (blood work) drawn today and your tests are completely normal, you will receive your results only by: Marland Kitchen MyChart Message (if you have MyChart) OR . A paper copy in the mail If you have any lab test that is abnormal or we need to change your treatment, we will call you to review the results.   Testing/Procedures: Your physician has requested that you have an echocardiogram in May . Echocardiography is a painless test that uses sound waves to create images of your heart. It provides your doctor with information about the size and shape of your heart and how well your heart's chambers and valves are working. This procedure takes approximately one hour. There are no restrictions for this procedure.     Follow-Up: At [facility], you and your health needs are our priority.  As part of our continuing mission to provide you with exceptional heart care, we have created designated Provider Care Teams.  These Care Teams include your primary Cardiologist (physician) and Advanced Practice Providers (APPs -  Physician Assistants and Nurse Practitioners) who all work together to provide you with the care you need, when you need it.  We recommend signing up for the patient portal called "MyChart".  Sign up information is provided on this After Visit Summary.  MyChart is used to connect with patients for Virtual Visits (Telemedicine).  Patients are able to view lab/test results, encounter notes, upcoming appointments, etc.  Non-urgent messages can be sent to your provider as well.   To learn more about what you can do with MyChart, go to [email or web address].    Your next  appointment:  Dr.McDowell will review your echo and then determine your follow up.      Thank you for choosing [facility] !

## 2019-12-21 DIAGNOSIS — L97522 Non-pressure chronic ulcer of other part of left foot with fat layer exposed: Secondary | ICD-10-CM | POA: Diagnosis not present

## 2019-12-21 DIAGNOSIS — I739 Peripheral vascular disease, unspecified: Secondary | ICD-10-CM | POA: Diagnosis not present

## 2019-12-26 ENCOUNTER — Telehealth: Payer: Self-pay | Admitting: Family Medicine

## 2019-12-26 DIAGNOSIS — R7301 Impaired fasting glucose: Secondary | ICD-10-CM

## 2019-12-26 DIAGNOSIS — E785 Hyperlipidemia, unspecified: Secondary | ICD-10-CM

## 2019-12-26 DIAGNOSIS — I10 Essential (primary) hypertension: Secondary | ICD-10-CM

## 2019-12-26 NOTE — Telephone Encounter (Signed)
Needs fasting labs ordered last year, ( need to re order) in next 1 to 2 weeks, pls advise him, thanks

## 2019-12-27 NOTE — Telephone Encounter (Signed)
Patient will get labs done

## 2019-12-27 NOTE — Addendum Note (Signed)
Addended by: Eual Fines on: 12/27/2019 08:49 AM   Modules accepted: Orders

## 2020-01-01 DIAGNOSIS — I10 Essential (primary) hypertension: Secondary | ICD-10-CM | POA: Diagnosis not present

## 2020-01-01 DIAGNOSIS — R7301 Impaired fasting glucose: Secondary | ICD-10-CM | POA: Diagnosis not present

## 2020-01-01 DIAGNOSIS — E785 Hyperlipidemia, unspecified: Secondary | ICD-10-CM | POA: Diagnosis not present

## 2020-01-02 LAB — COMPLETE METABOLIC PANEL WITH GFR
AG Ratio: 1.6 (calc) (ref 1.0–2.5)
ALT: 14 U/L (ref 9–46)
AST: 16 U/L (ref 10–35)
Albumin: 4.3 g/dL (ref 3.6–5.1)
Alkaline phosphatase (APISO): 41 U/L (ref 35–144)
BUN: 20 mg/dL (ref 7–25)
CO2: 28 mmol/L (ref 20–32)
Calcium: 9.7 mg/dL (ref 8.6–10.3)
Chloride: 103 mmol/L (ref 98–110)
Creat: 0.92 mg/dL (ref 0.70–1.11)
GFR, Est African American: 91 mL/min/{1.73_m2} (ref >=60)
GFR, Est Non African American: 78 mL/min/{1.73_m2} (ref >=60)
Globulin: 2.7 g/dL (calc) (ref 1.9–3.7)
Glucose, Bld: 100 mg/dL — ABNORMAL HIGH (ref 65–99)
Potassium: 4.2 mmol/L (ref 3.5–5.3)
Sodium: 139 mmol/L (ref 135–146)
Total Bilirubin: 0.6 mg/dL (ref 0.2–1.2)
Total Protein: 7 g/dL (ref 6.1–8.1)

## 2020-01-02 LAB — HEMOGLOBIN A1C
Hgb A1c MFr Bld: 5.8 % of total Hgb — ABNORMAL HIGH (ref <5.7)
Mean Plasma Glucose: 120 (calc)
eAG (mmol/L): 6.6 (calc)

## 2020-01-02 LAB — LIPID PANEL
Cholesterol: 141 mg/dL (ref <200)
HDL: 40 mg/dL (ref >=40)
LDL Cholesterol (Calc): 78 mg/dL (calc)
Non-HDL Cholesterol (Calc): 101 mg/dL (calc) (ref <130)
Total CHOL/HDL Ratio: 3.5 (calc) (ref <5.0)
Triglycerides: 135 mg/dL (ref <150)

## 2020-01-08 ENCOUNTER — Ambulatory Visit (INDEPENDENT_AMBULATORY_CARE_PROVIDER_SITE_OTHER): Payer: Medicare Other | Admitting: Family Medicine

## 2020-01-08 ENCOUNTER — Other Ambulatory Visit: Payer: Self-pay

## 2020-01-08 ENCOUNTER — Ambulatory Visit: Payer: Medicare Other | Admitting: Family Medicine

## 2020-01-08 ENCOUNTER — Encounter: Payer: Self-pay | Admitting: Family Medicine

## 2020-01-08 VITALS — BP 148/78 | HR 70 | Temp 97.6°F | Resp 15 | Ht 69.0 in | Wt 240.0 lb

## 2020-01-08 DIAGNOSIS — I69954 Hemiplegia and hemiparesis following unspecified cerebrovascular disease affecting left non-dominant side: Secondary | ICD-10-CM

## 2020-01-08 DIAGNOSIS — E785 Hyperlipidemia, unspecified: Secondary | ICD-10-CM

## 2020-01-08 DIAGNOSIS — R262 Difficulty in walking, not elsewhere classified: Secondary | ICD-10-CM

## 2020-01-08 DIAGNOSIS — R29898 Other symptoms and signs involving the musculoskeletal system: Secondary | ICD-10-CM

## 2020-01-08 DIAGNOSIS — I10 Essential (primary) hypertension: Secondary | ICD-10-CM

## 2020-01-08 DIAGNOSIS — I25119 Atherosclerotic heart disease of native coronary artery with unspecified angina pectoris: Secondary | ICD-10-CM

## 2020-01-08 DIAGNOSIS — R7303 Prediabetes: Secondary | ICD-10-CM | POA: Diagnosis not present

## 2020-01-08 MED ORDER — UNABLE TO FIND: 99 refills | Status: DC

## 2020-01-08 NOTE — Assessment & Plan Note (Signed)
Chronic status post stroke 2003, and capable of ADLs without assistance.  Has left brace to that leg and diabetic shoes.  Needs to get new diabetic shoes as current diabetic shoes have worn down secondary to the pressure in the way that the foot is shaped.

## 2020-01-08 NOTE — Assessment & Plan Note (Signed)
Wheelchair-bound for extended periods of time. Has never healed properly from foot fracture. Has calluses over the lateral aspect of his left toe and ball of foot secondary to how his foot is formed in the pressure of walking.  He has worn out his diabetic shoes and needs to get new ones at this time.  In addition to brace does not appear to be properly fitted secondary to the conformity of his leg after having the brace placed.

## 2020-01-08 NOTE — Progress Notes (Addendum)
Subjective:  Patient ID: Craig Fields, male    DOB: December 01, 1940  Age: 79 y.o. MRN: [id number]  CC:  Chief Complaint  Patient presents with  . Follow-up    needs new shoes and left brace from [facility]       HPI  HPI  [name] is an 79 year old male patient who has a left brace and diabetic shoe secondary to having left-sided hemiparesis from a stroke that he suffered back in 2003. Today he reports for follow-up on this to be seen so that he can get new shoes and brace made. Foot exam in body of note.  [name] has malalignment, skin breakdown, deformity of the foot and ankle.  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  He requires a custom torch walker to prevent progression of his foot deformity and accommodate malalignment and help keep him safe and stable in stance.  Denies having any other issues or concerns.  Very pleasant and jovial in conversation.  Yolanda Bonine is here today with him in the office.   Today patient denies signs and symptoms of COVID 19 infection including fever, chills, cough, shortness of breath, and headache. Past Medical, Surgical, Social History, Allergies, and Medications have been Reviewed.   Past Medical History:  Diagnosis Date  . Anxiety disorder   . Chronic back pain   . Colon cancer [facility]) May 2007  . Coronary atherosclerosis of native coronary artery    Multivessel status post CABG  . 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 Essential hypertension   . Hematuria 04/30/2019   New complaint, does nott have pain of kidney stones, needs to submit UA for testing asap and needs urology eval  . Hyperlipidemia   . IGT (impaired glucose tolerance)   . Obesity   . OSA (obstructive sleep apnea)   . Seizures ([place])    Age 1 or 3, no meds and no reoccurances  . Skin lesion 12/07/2012  . Skin tag 11/16/2011  . Stroke [facility])     Current  Meds  Medication Sig  . acetaminophen (TYLENOL 8 HOUR ARTHRITIS PAIN) 650 MG CR tablet Take by mouth.   Marland Kitchen aspirin (ASPIRIN LOW DOSE) 81 MG EC tablet Take 81 mg by mouth every evening.   . Cholecalciferol (VITAMIN D) 2000 units CAPS Take 1 capsule by mouth daily.  . clopidogrel (PLAVIX) 75 MG tablet TAKE 1 TABLET BY MOUTH EVERY DAY  . clotrimazole-betamethasone (LOTRISONE) cream APPLY TO AFFECTED AREA TWICE A DAY  . Coenzyme Q10 (CO Q-10) 200 MG CAPS Take 1 tablet by mouth daily.   Marland Kitchen KLOR-CON M20 20 MEQ tablet TAKE 2 TABLETS BY MOUTH DAILY  . lisinopril (ZESTRIL) 10 MG tablet TAKE 1 TABLET BY MOUTH EVERY DAY  . metoprolol tartrate (LOPRESSOR) 50 MG tablet TAKE 1/2 TABLET BY MOUTH 2 (TWO) TIMES DAILY.  . Multiple Vitamins-Minerals (CVS SPECTRAVITE) TABS Take 1 tablet by mouth every evening.   . Omega-3 Fatty Acids (FISH OIL) 1200 MG CAPS Take 2 capsules by mouth at bedtime. 1200 mg  . polyethylene glycol powder (GLYCOLAX/MIRALAX) powder MIX TO TAKE 17 G DAILY AS DIRECTED (Patient taking differently: Take 17 g by mouth every morning. )  . simvastatin (ZOCOR) 20 MG tablet TAKE 1 TABLET BY MOUTH EVERYDAY AT BEDTIME  . sodium chloride (OCEAN) 0.65 % SOLN nasal spray Place 1 spray into  both nostrils as needed for congestion.  . triamterene-hydrochlorothiazide (MAXZIDE-25) 37.5-25 MG tablet TAKE 1 TABLET BY MOUTH EVERY DAY IN THE MORNING  . UNABLE TO FIND Commode assist chair  . [DISCONTINUED] UNABLE TO FIND Bilateral shoes and calliber plate (left) Dx [id number]  . [DISCONTINUED] UNABLE TO FIND Bilateral shoes and calliber plate (left) Dx [id number]    ROS:  Review of Systems  Constitutional: Negative.   HENT: Negative.   Eyes: Negative.   Respiratory: Negative.   Cardiovascular: Negative.   Gastrointestinal: Negative.   Genitourinary: Negative.   Musculoskeletal: Negative.        Ill fitting diabetic shoes and brace.  Skin: Negative.   Neurological: Negative.   Endo/Heme/Allergies: Negative.    Psychiatric/Behavioral: Negative.   All other systems reviewed and are negative.    Objective:   Today's Vitals: BP (!) 148/78   Pulse 70   Temp 97.6 F (36.4 C)   Resp 15   Ht 5\' 9"  (1.753 m)   Wt 240 lb (108.9 kg)   SpO2 96%   BMI 35.44 kg/m  Vitals with BMI 01/08/2020 12/13/2019 06/20/2019  Height 5\' 9"  5\' 9"  5\' 9"   Weight 240 lbs 230 lbs 240 lbs  BMI 35.43 99991111 0000000  Systolic 123456 123456 [id number]  Diastolic 78 77 74  Pulse 70 67 75     Physical Exam Vitals and nursing note reviewed.  Constitutional:      Appearance: Normal appearance. He is obese.  HENT:     Head: Normocephalic and atraumatic.     Right Ear: External ear normal.     Left Ear: External ear normal.     Mouth/Throat:     Comments: Mask in place Eyes:     General:        Right eye: No discharge.        Left eye: No discharge.     Conjunctiva/sclera: Conjunctivae normal.     Comments: Glasses  Cardiovascular:     Rate and Rhythm: Normal rate and regular rhythm.     Pulses:          Dorsalis pedis pulses are 2+ on the right side and 1+ on the left side.       Posterior tibial pulses are 2+ on the right side and 2+ on the left side.     Heart sounds: Normal heart sounds.  Pulmonary:     Effort: Pulmonary effort is normal.     Breath sounds: Normal breath sounds.  Musculoskeletal:     Cervical back: Normal range of motion and neck supple.     Right foot: Normal range of motion. No deformity.     Left foot: Decreased range of motion. Normal capillary refill. Deformity, bunion and tenderness present. Normal pulse.     Comments: Left arm is contracted toward the body, forearm and hand are rolled in an contraction towards the midline of the body pressure into the belly.  Minimal discomfort with this.  Left foot is deformed secondary to previous fractures.  Has calluses on the small toe and ball of foot into the lateral aspect of the foot.  Left leg appears to be slightly deformed possibly related to the  brace not fitting properly anymore would benefit from new assessment and measurement of this.  Has a noted 3 mm spot on the posterior aspect of the metatarsals that appears to be healing from injury.    Feet:     Right foot:     Protective Sensation:  5 sites tested. 4 sites sensed.     Toenail Condition: Right toenails are normal.     Left foot:     Protective Sensation: 5 sites tested. 4 sites sensed.     Skin integrity: Ulcer, blister, callus and dry skin present.     Toenail Condition: Left toenails are normal.  Skin:    General: Skin is warm.  Neurological:     General: No focal deficit present.     Mental Status: He is alert and oriented to person, place, and time.  Psychiatric:        Mood and Affect: Mood normal.        Behavior: Behavior normal.        Thought Content: Thought content normal.        Judgment: Judgment normal.     Assessment   1. 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 Morbid obesity due to excess calories ([place])   3. Left leg weakness   4. Essential hypertension, benign   5. Difficulty walking   6. Prediabetes   7. Hyperlipidemia LDL goal <70     Tests ordered No orders of the defined types were placed in this encounter.    Plan: Please see assessment and plan per problem list above.  [name] has malalignment, skin breakdown, deformity of the foot and ankle.  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  He requires a custom torch walker to prevent progression of his foot deformity and accommodate malalignment and help keep him safe and stable in stance.    Meds ordered this encounter  Medications  . DISCONTD: UNABLE TO FIND    Sig: Bilateral shoes and calliber plate (left) Dx [id number]    Dispense:  1 each    Refill:  PRN     Patient to follow-up in 4 months  Perlie Mayo, NP

## 2020-01-08 NOTE — Patient Instructions (Addendum)
I appreciate the opportunity to provide you with care for your health and wellness. Today we discussed: New shoes and left brace from [facility]  Follow up: 4 months  No labs or referrals today  Previous labs drawn last week were great.  No medication changes needed at this time.  Will send in information to hopefully get new brace and shoes as soon as possible.  Enjoy the sunshine take care we will see you at the end of the summer!  Please continue to practice social distancing to keep you, your family, and our community safe.  If you must go out, please wear a mask and practice good handwashing.  It was a pleasure to see you and I look forward to continuing to work together on your health and well-being. Please do not hesitate to call the office if you need care or have questions about your care.  Have a wonderful day and week. With Gratitude, Cherly Beach, DNP, AGNP-BC

## 2020-01-08 NOTE — Assessment & Plan Note (Addendum)
Unchanged, Obesity is linked to hypertension, cerebrovascular stroke history, prediabetes, hyperlipidemia.  Craig Fields is re-educated about the importance of exercise daily to help with weight management. A minumum of 30 minutes daily is recommended. Additionally, importance of healthy food choices  with portion control discussed.   Wt Readings from Last 3 Encounters:  01/08/20 240 lb (108.9 kg)  12/13/19 230 lb (104.3 kg)  06/20/19 240 lb (108.9 kg)

## 2020-01-08 NOTE — Assessment & Plan Note (Signed)
Blood pressure slightly elevated today.  Reports taking medication as directed. Advised to continue take thank you taking medication as directed. Follow-up in 4 months.

## 2020-01-08 NOTE — Assessment & Plan Note (Signed)
Stable, encouraged a well-balanced diet.

## 2020-01-08 NOTE — Assessment & Plan Note (Addendum)
Patient is at increased risk of falls due to hemiplegia, fall precautions have been reviewed.  Additionally will be working to get him new shoes and a left brace to help with stability with transfers.  [name] has malalignment, skin breakdown, deformity of the foot and ankle.  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  He requires a custom torch walker to prevent progression of his foot deformity and accommodate malalignment and help keep him safe and stable in stance.

## 2020-01-15 ENCOUNTER — Other Ambulatory Visit (HOSPITAL_COMMUNITY): Payer: Medicare Other

## 2020-01-25 DIAGNOSIS — B351 Tinea unguium: Secondary | ICD-10-CM | POA: Diagnosis not present

## 2020-01-25 DIAGNOSIS — I739 Peripheral vascular disease, unspecified: Secondary | ICD-10-CM | POA: Diagnosis not present

## 2020-01-25 DIAGNOSIS — L851 Acquired keratosis [keratoderma] palmaris et plantaris: Secondary | ICD-10-CM | POA: Diagnosis not present

## 2020-02-04 ENCOUNTER — Other Ambulatory Visit: Payer: Self-pay

## 2020-02-04 MED ORDER — UNABLE TO FIND: 0 refills | Status: DC

## 2020-02-20 ENCOUNTER — Telehealth: Payer: Self-pay

## 2020-02-20 NOTE — Telephone Encounter (Signed)
I have paperwork from today on this patient. I did not have it until today. It looks like it was faxed today as well.

## 2020-02-20 NOTE — Telephone Encounter (Signed)
Sharyn Lull w\ [facility] left a message that they need to talk to someone regarding pt

## 2020-02-20 NOTE — Telephone Encounter (Signed)
Stated they faxed paperwork for this patient on May 12 and it hasn't been returned. Would you happen to have this paperwork?

## 2020-02-27 ENCOUNTER — Other Ambulatory Visit: Payer: Self-pay | Admitting: *Deleted

## 2020-02-27 MED ORDER — METOPROLOL TARTRATE 50 MG PO TABS: ORAL_TABLET | ORAL | 1 refills | Status: DC

## 2020-02-27 MED ORDER — CLOPIDOGREL BISULFATE 75 MG PO TABS: 75.0000 mg | ORAL_TABLET | Freq: Every day | ORAL | 1 refills | Status: DC

## 2020-03-03 ENCOUNTER — Other Ambulatory Visit: Payer: Self-pay

## 2020-03-03 ENCOUNTER — Ambulatory Visit (HOSPITAL_COMMUNITY)
Admission: RE | Admit: 2020-03-03 | Discharge: 2020-03-03 | Disposition: A | Discharge status: Home / Self Care | Payer: Medicare Other | Source: Ambulatory Visit | Attending: Cardiology | Admitting: Cardiology

## 2020-03-03 DIAGNOSIS — I350 Nonrheumatic aortic (valve) stenosis: Secondary | ICD-10-CM | POA: Insufficient documentation

## 2020-03-03 NOTE — Progress Notes (Signed)
*  PRELIMINARY RESULTS* Echocardiogram 2D Echocardiogram has been performed.  Samuel Germany 03/03/2020, 1:58 PM

## 2020-03-05 ENCOUNTER — Telehealth: Payer: Self-pay | Admitting: Cardiology

## 2020-03-05 ENCOUNTER — Telehealth: Payer: Self-pay | Admitting: *Deleted

## 2020-03-05 DIAGNOSIS — I350 Nonrheumatic aortic (valve) stenosis: Secondary | ICD-10-CM

## 2020-03-05 NOTE — Telephone Encounter (Signed)
Pt given echo results 

## 2020-03-05 NOTE — Telephone Encounter (Signed)
Pt is requesting result  From ECHO   Please call [phone]   Thanks renee

## 2020-03-05 NOTE — Telephone Encounter (Signed)
-----   Message from Satira Sark, MD sent at 03/04/2020  8:07 AM EDT ----- Results reviewed.  LVEF normal at 60 to 65%.  Aortic stenosis is in the moderate to severe range at this point with increase in mean gradient to 33 mmHg compared to last study.  Also mild to moderate aortic regurgitation.  Suggest 64-month office follow-up with echocardiogram at that time.

## 2020-03-05 NOTE — Telephone Encounter (Signed)
Pt.notified

## 2020-03-13 ENCOUNTER — Other Ambulatory Visit: Payer: Self-pay | Admitting: Family Medicine

## 2020-04-18 DIAGNOSIS — B351 Tinea unguium: Secondary | ICD-10-CM | POA: Diagnosis not present

## 2020-04-18 DIAGNOSIS — I739 Peripheral vascular disease, unspecified: Secondary | ICD-10-CM | POA: Diagnosis not present

## 2020-04-18 DIAGNOSIS — L851 Acquired keratosis [keratoderma] palmaris et plantaris: Secondary | ICD-10-CM | POA: Diagnosis not present

## 2020-05-09 ENCOUNTER — Other Ambulatory Visit: Payer: Self-pay | Admitting: Family Medicine

## 2020-05-12 ENCOUNTER — Ambulatory Visit: Payer: Medicare Other | Admitting: Family Medicine

## 2020-05-13 ENCOUNTER — Other Ambulatory Visit: Payer: Self-pay

## 2020-05-13 ENCOUNTER — Encounter: Payer: Self-pay | Admitting: Family Medicine

## 2020-05-13 ENCOUNTER — Telehealth (INDEPENDENT_AMBULATORY_CARE_PROVIDER_SITE_OTHER): Payer: Medicare Other | Admitting: Family Medicine

## 2020-05-13 VITALS — Ht 70.0 in | Wt 232.0 lb

## 2020-05-13 DIAGNOSIS — E559 Vitamin D deficiency, unspecified: Secondary | ICD-10-CM

## 2020-05-13 DIAGNOSIS — E785 Hyperlipidemia, unspecified: Secondary | ICD-10-CM

## 2020-05-13 DIAGNOSIS — I25119 Atherosclerotic heart disease of native coronary artery with unspecified angina pectoris: Secondary | ICD-10-CM | POA: Diagnosis not present

## 2020-05-13 DIAGNOSIS — R29898 Other symptoms and signs involving the musculoskeletal system: Secondary | ICD-10-CM | POA: Diagnosis not present

## 2020-05-13 DIAGNOSIS — Z125 Encounter for screening for malignant neoplasm of prostate: Secondary | ICD-10-CM | POA: Diagnosis not present

## 2020-05-13 DIAGNOSIS — E669 Obesity, unspecified: Secondary | ICD-10-CM | POA: Diagnosis not present

## 2020-05-13 DIAGNOSIS — I10 Essential (primary) hypertension: Secondary | ICD-10-CM | POA: Diagnosis not present

## 2020-05-13 NOTE — Patient Instructions (Addendum)
F/U with mD in 6 months, in office , call if you need me before  Fasting lipid, cmp annd eGFr, PSA , cBC, PSA, tSH and vit d mid October   Call for flu vaccine in October please  Please be careful not to fall, keep home safe  Thankful that new boot is much better    Thanks for choosing [facility], we consider it a privelige to serve you.  Good that you have changed your eating and are losing weight as a result of this  Try to commit to daily exercises that you can do for overall general good health . Chair exercises are printed for you.   Exercises To Do While Sitting  Exercises that you do while sitting (chair exercises) can give you many of the same benefits as full exercise. Benefits include strengthening your heart, burning calories, and keeping muscles and joints healthy. Exercise can also improve your mood and help with depression and anxiety. You may benefit from chair exercises if you are unable to do standing exercises because of:  Diabetic foot pain.  Obesity.  Illness.  Arthritis.  Recovery from surgery or injury.  Breathing problems.  Balance problems.  Another type of disability. Before starting chair exercises, check with your health care provider or a physical therapist to find out how much exercise you can tolerate and which exercises are safe for you. If your health care provider approves:  Start out slowly and build up over time. Aim to work up to about 10-20 minutes for each exercise session.  Make exercise part of your daily routine.  Drink water when you exercise. Do not wait until you are thirsty. Drink every 10-15 minutes.  Stop exercising right away if you have pain, nausea, shortness of breath, or dizziness.  If you are exercising in a wheelchair, make sure to lock the wheels.  Ask your health care provider whether you can do tai chi or yoga. Many positions in these mind-body exercises can be modified to do while  seated. Warm-up Before starting other exercises: 1. Sit up as straight as you can. Have your knees bent at 90 degrees, which is the shape of the capital letter "L." Keep your feet flat on the floor. 2. Sit at the front edge of your chair, if you can. 3. Pull in (tighten) the muscles in your abdomen and stretch your spine and neck as straight as you can. Hold this position for a few minutes. 4. Breathe in and out evenly. Try to concentrate on your breathing, and relax your mind. Stretching Exercise A: Arm stretch 1. Hold your arms out straight in front of your body. 2. Bend your hands at the wrist with your fingers pointing up, as if signaling someone to stop. Notice the slight tension in your forearms as you hold the position. 3. Keeping your arms out and your hands bent, rotate your hands outward as far as you can and hold this stretch. Aim to have your thumbs pointing up and your pinkie fingers pointing down. Slowly repeat arm stretches for one minute as tolerated. Exercise B: Leg stretch 1. If you can move your legs, try to "draw" letters on the floor with the toes of your foot. Write your name with one foot. 2. Write your name with the toes of your other foot. Slowly repeat the movements for one minute as tolerated. Exercise C: Reach for the sky 1. Reach your hands as far over your head as you can to stretch  your spine. 2. Move your hands and arms as if you are climbing a rope. Slowly repeat the movements for one minute as tolerated. Range of motion exercises Exercise A: Shoulder roll 1. Let your arms hang loosely at your sides. 2. Lift just your shoulders up toward your ears, then let them relax back down. 3. When your shoulders feel loose, rotate your shoulders in backward and forward circles. Do shoulder rolls slowly for one minute as tolerated. Exercise B: March in place 1. As if you are marching, pump your arms and lift your legs up and down. Lift your knees as high as you  can. ? If you are unable to lift your knees, just pump your arms and move your ankles and feet up and down. March in place for one minute as tolerated. Exercise C: Seated jumping jacks 1. Let your arms hang down straight. 2. Keeping your arms straight, lift them up over your head. Aim to point your fingers to the ceiling. 3. While you lift your arms, straighten your legs and slide your heels along the floor to your sides, as wide as you can. 4. As you bring your arms back down to your sides, slide your legs back together. ? If you are unable to use your legs, just move your arms. Slowly repeat seated jumping jacks for one minute as tolerated. Strengthening exercises Exercise A: Shoulder squeeze 1. Hold your arms straight out from your body to your sides, with your elbows bent and your fists pointed at the ceiling. 2. Keeping your arms in the bent position, move them forward so your elbows and forearms meet in front of your face. 3. Open your arms back out as wide as you can with your elbows still bent, until you feel your shoulder blades squeezing together. Hold for 5 seconds. Slowly repeat the movements forward and backward for one minute as tolerated. Contact a health care provider if you:  Had to stop exercising due to any of the following: ? Pain. ? Nausea. ? Shortness of breath. ? Dizziness. ? Fatigue.  Have significant pain or soreness after exercising. Get help right away if you have:  Chest pain.  Difficulty breathing. These symptoms may represent a serious problem that is an emergency. Do not wait to see if the symptoms will go away. Get medical help right away. Call your local emergency services (911 in the [place] Do not drive yourself to the hospital. This information is not intended to replace advice given to you by your health care provider. Make sure you discuss any questions you have with your health care provider. Document Revised: 12/21/2018 Document Reviewed:  07/13/2017 Elsevier Patient Education  2020 [employer].

## 2020-05-13 NOTE — Progress Notes (Signed)
Virtual Visit via Telephone Note  I connected with Boleslaus Holloway Yagi on 05/13/20 at  4:00 PM EDT by telephone and verified that I am speaking with the correct person using two identifiers.  Location: Patient: home  Provider: office   I discussed the limitations, risks, security and privacy concerns of performing an evaluation and management service by telephone and the availability of in person appointments. I also discussed with the patient that there may be a patient responsible charge related to this service. The patient expressed understanding and agreed to proceed.   History of Present Illness:   2 month h/o boot  For left foot from [facility] , best thing that ever happened to him, excellent results, heavier than previous but more comfortable Fallen once in the past 2 months States doing well generally, and has no specific Denies recent fever or chills. Denies sinus pressure, nasal congestion, ear pain or sore throat. Denies chest congestion, productive cough or wheezing. Denies chest pains, palpitations and leg swelling Denies abdominal pain, nausea, vomiting,diarrhea or constipation.   Denies dysuria, frequency, hesitancy or incontinence. Chronic and unchanged  joint pain, swelling and limitation in mobility. Denies headaches, seizures, numbness, or tingling. Denies depression, anxiety or insomnia. Denies skin break down or rash.     concerns Observations/Objective: Ht 5\' 10"  (1.778 m)   Wt 232 lb (105.2 kg)   BMI 33.29 kg/m   Good communication with no confusion and intact memory. Alert and oriented x 3 No signs of respiratory distress during speech     Assessment and Plan: Essential hypertension, benign Needs in  Office check , last 2 readings are high  DASH diet and commitment to daily physical activity for a minimum of 30 minutes discussed and encouraged, as a part of hypertension management. The importance of attaining a healthy weight is also  discussed.  BP/Weight 05/13/2020 01/08/2020 12/13/2019 06/20/2019 04/26/2019 10/17/2018 24/40/1027  Systolic BP - 253 664 403 474 259 563  Diastolic BP - 78 77 74 74 74 74  Wt. (Lbs) 232 240 230 240 240 251 254  BMI 33.29 35.44 33.97 35.44 35.44 37.07 37.51       Hyperlipidemia LDL goal <70 Hyperlipidemia:Low fat diet discussed and encouraged.   Lipid Panel  Lab Results  Component Value Date   CHOL 141 01/01/2020   HDL 40 01/01/2020   LDLCALC 78 01/01/2020   TRIG 135 01/01/2020   CHOLHDL 3.5 01/01/2020  Controlled, no change in medication      Obesity (BMI 30.0-34.9)  Patient re-educated about  the importance of commitment to a  minimum of 150 minutes of exercise per week as able.  The importance of healthy food choices with portion control discussed, as well as eating regularly and within a 12 hour window most days. The need to choose "clean , green" food 50 to 75% of the time is discussed, as well as to make water the primary drink and set a goal of 64 ounces water daily.    Weight /BMI 05/13/2020 01/08/2020 12/13/2019  WEIGHT 232 lb 240 lb 230 lb  HEIGHT 5\' 10"  5\' 9"  5\' 9"   BMI 33.29 kg/m2 35.44 kg/m2 33.97 kg/m2      Left leg weakness chronc from [facility], remains at high fall risk. Home safety and the need for regular exercise is reviewed    Follow Up Instructions:    I discussed the assessment and treatment plan with the patient. The patient was provided an opportunity to ask questions and all were  answered. The patient agreed with the plan and demonstrated an understanding of the instructions.   The patient was advised to call back or seek an in-person evaluation if the symptoms worsen or if the condition fails to improve as anticipated.   I provided  minutes of non-face-to-face time during this encounter.   Tula Nakayama, MD

## 2020-05-18 ENCOUNTER — Encounter: Payer: Self-pay | Admitting: Family Medicine

## 2020-05-18 NOTE — Assessment & Plan Note (Signed)
chronc from [facility], remains at high fall risk. Home safety and the need for regular exercise is reviewed

## 2020-05-18 NOTE — Assessment & Plan Note (Signed)
Needs in  Office check , last 2 readings are high  DASH diet and commitment to daily physical activity for a minimum of 30 minutes discussed and encouraged, as a part of hypertension management. The importance of attaining a healthy weight is also discussed.  BP/Weight 05/13/2020 01/08/2020 12/13/2019 06/20/2019 04/26/2019 10/17/2018 12/24/6436  Systolic BP - 377 939 688 648 472 072  Diastolic BP - 78 77 74 74 74 74  Wt. (Lbs) 232 240 230 240 240 251 254  BMI 33.29 35.44 33.97 35.44 35.44 37.07 37.51

## 2020-05-18 NOTE — Assessment & Plan Note (Signed)
Hyperlipidemia:Low fat diet discussed and encouraged.   Lipid Panel  Lab Results  Component Value Date   CHOL 141 01/01/2020   HDL 40 01/01/2020   LDLCALC 78 01/01/2020   TRIG 135 01/01/2020   CHOLHDL 3.5 01/01/2020  Controlled, no change in medication

## 2020-05-18 NOTE — Assessment & Plan Note (Signed)
  Patient re-educated about  the importance of commitment to a  minimum of 150 minutes of exercise per week as able.  The importance of healthy food choices with portion control discussed, as well as eating regularly and within a 12 hour window most days. The need to choose "clean , green" food 50 to 75% of the time is discussed, as well as to make water the primary drink and set a goal of 64 ounces water daily.    Weight /BMI 05/13/2020 01/08/2020 12/13/2019  WEIGHT 232 lb 240 lb 230 lb  HEIGHT 5\' 10"  5\' 9"  5\' 9"   BMI 33.29 kg/m2 35.44 kg/m2 33.97 kg/m2

## 2020-06-20 ENCOUNTER — Telehealth (INDEPENDENT_AMBULATORY_CARE_PROVIDER_SITE_OTHER): Payer: Medicare Other | Admitting: Family Medicine

## 2020-06-20 ENCOUNTER — Other Ambulatory Visit: Payer: Self-pay

## 2020-06-20 ENCOUNTER — Encounter: Payer: Self-pay | Admitting: Family Medicine

## 2020-06-20 VITALS — BP 157/78 | HR 61 | Ht 69.0 in | Wt 238.0 lb

## 2020-06-20 DIAGNOSIS — Z0000 Encounter for general adult medical examination without abnormal findings: Secondary | ICD-10-CM | POA: Diagnosis not present

## 2020-06-20 NOTE — Patient Instructions (Addendum)
Craig Fields , Thank you for taking time to come for your Medicare Wellness Visit. I appreciate your ongoing commitment to your health goals. Please review the following plan we discussed and let me know if I can assist you in the future.   Please continue to practice social distancing to keep you, your family, and our community safe.  If you must go out, please wear a Mask and practice good handwashing.  Screening recommendations/referrals: Colonoscopy: no longer needed Recommended yearly ophthalmology/optometry visit for glaucoma screening and checkup Recommended yearly dental visit for hygiene and checkup  Vaccinations: Influenza vaccine: needs appt Pneumococcal vaccine: completed Tdap vaccine: up to date Shingles vaccine: completed zoster  Advanced directives: completed  Conditions/risks identified: Falls  Next appointment:  Will call us for a Friday appt for Flu vaccine, Also needs follow up appt in first part of year for Dr Moshe Cipro   Preventive Care 65 Years and Older, Male Preventive care refers to lifestyle choices and visits with your health care provider that can promote health and wellness. What does preventive care include?  A yearly physical exam. This is also called an annual well check.  Dental exams once or twice a year.  Routine eye exams. Ask your health care provider how often you should have your eyes checked.  Personal lifestyle choices, including:  Daily care of your teeth and gums.  Regular physical activity.  Eating a healthy diet.  Avoiding tobacco and drug use.  Limiting alcohol use.  Practicing safe sex.  Taking low doses of aspirin every day.  Taking vitamin and mineral supplements as recommended by your health care provider. What happens during an annual well check? The services and screenings done by your health care provider during your annual well check will depend on your age, overall health, lifestyle risk factors, and family history  of disease. Counseling  Your health care provider may ask you questions about your:  Alcohol use.  Tobacco use.  Drug use.  Emotional well-being.  Home and relationship well-being.  Sexual activity.  Eating habits.  History of falls.  Memory and ability to understand (cognition).  Work and work Statistician. Screening  You may have the following tests or measurements:  Height, weight, and BMI.  Blood pressure.  Lipid and cholesterol levels. These may be checked every 5 years, or more frequently if you are over 91 years old.  Skin check.  Lung cancer screening. You may have this screening every year starting at age 73 if you have a 30-pack-year history of smoking and currently smoke or have quit within the past 15 years.  Fecal occult blood test (FOBT) of the stool. You may have this test every year starting at age 81.  Flexible sigmoidoscopy or colonoscopy. You may have a sigmoidoscopy every 5 years or a colonoscopy every 10 years starting at age 8.  Prostate cancer screening. Recommendations will vary depending on your family history and other risks.  Hepatitis C blood test.  Hepatitis B blood test.  Sexually transmitted disease (STD) testing.  Diabetes screening. This is done by checking your blood sugar (glucose) after you have not eaten for a while (fasting). You may have this done every 1-3 years.  Abdominal aortic aneurysm (AAA) screening. You may need this if you are a current or former smoker.  Osteoporosis. You may be screened starting at age 92 if you are at high risk. Talk with your health care provider about your test results, treatment options, and if necessary, the need for  more tests. Vaccines  Your health care provider may recommend certain vaccines, such as:  Influenza vaccine. This is recommended every year.  Tetanus, diphtheria, and acellular pertussis (Tdap, Td) vaccine. You may need a Td booster every 10 years.  Zoster vaccine. You may  need this after age 24.  Pneumococcal 13-valent conjugate (PCV13) vaccine. One dose is recommended after age 43.  Pneumococcal polysaccharide (PPSV23) vaccine. One dose is recommended after age 47. Talk to your health care provider about which screenings and vaccines you need and how often you need them. This information is not intended to replace advice given to you by your health care provider. Make sure you discuss any questions you have with your health care provider. Document Released: 09/26/2015 Document Revised: 05/19/2016 Document Reviewed: 07/01/2015 Elsevier Interactive Patient Education  2017 [place] Prevention in the Home Falls can cause injuries. They can happen to people of all ages. There are many things you can do to make your home safe and to help prevent falls. What can I do on the outside of my home?  Regularly fix the edges of walkways and driveways and fix any cracks.  Remove anything that might make you trip as you walk through a door, such as a raised step or threshold.  Trim any bushes or trees on the path to your home.  Use bright outdoor lighting.  Clear any walking paths of anything that might make someone trip, such as rocks or tools.  Regularly check to see if handrails are loose or broken. Make sure that both sides of any steps have handrails.  Any raised decks and porches should have guardrails on the edges.  Have any leaves, snow, or ice cleared regularly.  Use sand or salt on walking paths during winter.  Clean up any spills in your garage right away. This includes oil or grease spills. What can I do in the bathroom?  Use night lights.  Install grab bars by the toilet and in the tub and shower. Do not use towel bars as grab bars.  Use non-skid mats or decals in the tub or shower.  If you need to sit down in the shower, use a plastic, non-slip stool.  Keep the floor dry. Clean up any water that spills on the floor as soon as it  happens.  Remove soap buildup in the tub or shower regularly.  Attach bath mats securely with double-sided non-slip rug tape.  Do not have throw rugs and other things on the floor that can make you trip. What can I do in the bedroom?  Use night lights.  Make sure that you have a light by your bed that is easy to reach.  Do not use any sheets or blankets that are too big for your bed. They should not hang down onto the floor.  Have a firm chair that has side arms. You can use this for support while you get dressed.  Do not have throw rugs and other things on the floor that can make you trip. What can I do in the kitchen?  Clean up any spills right away.  Avoid walking on wet floors.  Keep items that you use a lot in easy-to-reach places.  If you need to reach something above you, use a strong step stool that has a grab bar.  Keep electrical cords out of the way.  Do not use floor polish or wax that makes floors slippery. If you must use wax, use non-skid  floor wax.  Do not have throw rugs and other things on the floor that can make you trip. What can I do with my stairs?  Do not leave any items on the stairs.  Make sure that there are handrails on both sides of the stairs and use them. Fix handrails that are broken or loose. Make sure that handrails are as long as the stairways.  Check any carpeting to make sure that it is firmly attached to the stairs. Fix any carpet that is loose or worn.  Avoid having throw rugs at the top or bottom of the stairs. If you do have throw rugs, attach them to the floor with carpet tape.  Make sure that you have a light switch at the top of the stairs and the bottom of the stairs. If you do not have them, ask someone to add them for you. What else can I do to help prevent falls?  Wear shoes that:  Do not have high heels.  Have rubber bottoms.  Are comfortable and fit you well.  Are closed at the toe. Do not wear sandals.  If you  use a stepladder:  Make sure that it is fully opened. Do not climb a closed stepladder.  Make sure that both sides of the stepladder are locked into place.  Ask someone to hold it for you, if possible.  Clearly mark and make sure that you can see:  Any grab bars or handrails.  First and last steps.  Where the edge of each step is.  Use tools that help you move around (mobility aids) if they are needed. These include:  Canes.  Walkers.  Scooters.  Crutches.  Turn on the lights when you go into a dark area. Replace any light bulbs as soon as they burn out.  Set up your furniture so you have a clear path. Avoid moving your furniture around.  If any of your floors are uneven, fix them.  If there are any pets around you, be aware of where they are.  Review your medicines with your doctor. Some medicines can make you feel dizzy. This can increase your chance of falling. Ask your doctor what other things that you can do to help prevent falls. This information is not intended to replace advice given to you by your health care provider. Make sure you discuss any questions you have with your health care provider. Document Released: 06/26/2009 Document Revised: 02/05/2016 Document Reviewed: 10/04/2014 Elsevier Interactive Patient Education  2017 [employer].

## 2020-06-20 NOTE — Progress Notes (Signed)
Subjective:   Craig Fields is a 79 y.o. male who presents for Medicare Annual/Subsequent preventive examination.  Method of visit: Telephone  Location of Patient: Home Location of Provider: Office Consent was obtain for visit over the telephone. Services rendered by provider: Visit was performed via telephone  I verified that I am speaking with the correct person using two identifiers.  Review of Systems     Cardiac Risk Factors include: advanced age (>1men, >37 women);diabetes mellitus;dyslipidemia;hypertension;sedentary lifestyle;obesity (BMI >30kg/m2);male gender     Objective:    Today's Vitals   06/20/20 0944 06/20/20 0945  BP: (!) 157/78   Pulse: 61   Weight: 238 lb (108 kg)   Height: 5\' 9"  (1.753 m)   PainSc: 0-No pain 0-No pain   Body mass index is 35.15 kg/m.  Advanced Directives 06/14/2018 05/11/2018 06/13/2017 11/24/2016 07/15/2015 12/05/2013 01/01/2013  Does Patient Have a Medical Advance Directive? No No No No No Patient does not have advance directive;Patient would like information Patient does not have advance directive;Patient would not like information  Would patient like information on creating a medical advance directive? Yes (ED - Information included in AVS) - Yes (MAU/Ambulatory/Procedural Areas - Information given) No - Patient declined No - patient declined information Advance directive packet given -  Pre-existing out of facility DNR order (yellow form or pink MOST form) - - - - - - No    Current Medications (verified) Outpatient Encounter Medications as of 06/20/2020  Medication Sig   acetaminophen (TYLENOL 8 HOUR ARTHRITIS PAIN) 650 MG CR tablet Take by mouth.    aspirin (ASPIRIN LOW DOSE) 81 MG EC tablet Take 81 mg by mouth every evening.    Cholecalciferol (VITAMIN D) 2000 units CAPS Take 1 capsule by mouth daily.   clopidogrel (PLAVIX) 75 MG tablet Take 1 tablet (75 mg total) by mouth daily.   Coenzyme Q10 (CO Q-10) 200 MG CAPS Take 1 tablet  by mouth daily.    KLOR-CON M20 20 MEQ tablet TAKE 2 TABLETS BY MOUTH DAILY   lisinopril (ZESTRIL) 10 MG tablet TAKE 1 TABLET BY MOUTH EVERY DAY   metoprolol tartrate (LOPRESSOR) 50 MG tablet TAKE 1/2 TABLET BY MOUTH 2 (TWO) TIMES DAILY.   Multiple Vitamins-Minerals ([employer] SPECTRAVITE) TABS Take 1 tablet by mouth every evening.    Omega-3 Fatty Acids (FISH OIL) 1200 MG CAPS Take 2 capsules by mouth at bedtime. 1200 mg   polyethylene glycol powder (GLYCOLAX/MIRALAX) powder MIX TO TAKE 17 G DAILY AS DIRECTED (Patient taking differently: Take 17 g by mouth every morning. )   simvastatin (ZOCOR) 20 MG tablet TAKE 1 TABLET BY MOUTH EVERYDAY AT BEDTIME   sodium chloride (OCEAN) 0.65 % SOLN nasal spray Place 1 spray into both nostrils as needed for congestion.   triamterene-hydrochlorothiazide (MAXZIDE-25) 37.5-25 MG tablet TAKE 1 TABLET BY MOUTH EVERY DAY IN THE MORNING   UNABLE TO FIND Commode assist chair   UNABLE TO FIND Bilateral new shoes  Left AFO with calliber plate  Dx P59.163   UNABLE TO FIND Right custom torch walker x 1  Dx W46.659   No facility-administered encounter medications on file as of 06/20/2020.    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    Essential hypertension    Hematuria 04/30/2019   New complaint, does nott have pain of kidney stones, needs to submit UA for testing asap and needs urology eval   Hyperlipidemia    IGT (impaired glucose tolerance)    Obesity    OSA (obstructive sleep apnea)    Seizures ([place])    Age 79 or 3, no meds and no reoccurances   Skin lesion 12/07/2012   Skin tag 11/16/2011   Stroke [facility])    Past Surgical History:  Procedure Laterality Date   COLONOSCOPY  N/A 07/15/2015   Procedure: COLONOSCOPY;  Surgeon: Aviva Signs, MD;  Location: [facility];  Service: Gastroenterology;  Laterality: N/A;   CORONARY ARTERY BYPASS GRAFT  2004   x4   INCISIONAL HERNIA REPAIR N/A 01/01/2013   Procedure: HERNIA REPAIR INCISIONAL;  Surgeon: Jamesetta So, MD;  Location: [facility] ORS;  Service: General;  Laterality: N/A;  umbilical area   INSERTION OF MESH N/A 01/01/2013   Procedure: INSERTION OF MESH;  Surgeon: Jamesetta So, MD;  Location: [facility] ORS;  Service: General;  Laterality: N/A;   Left inguinal  hernia repair  2005   [name]  2007   Family History  Problem Relation Age of Onset   Pancreatic cancer Mother    Heart disease Father    Prostate cancer Father    Stroke Brother    Heart disease Brother    Heart disease Brother    COPD Sister    Actinic keratosis Sister    Obesity Son    Anxiety disorder Son    Social History   Socioeconomic History   Marital status: Married    Spouse name: Not on file   Number of children: 2   Years of education: Not on file   Highest education level: 12th grade  Occupational History   Occupation: retired   Tobacco Use   Smoking status: Former Smoker    Packs/day: 0.50    Years: 5.00    Pack years: 2.50    Types: Cigarettes    Quit date: 12/27/1980    Years since quitting: 39.5   Smokeless tobacco: Former Systems developer    Types: Chew    Quit date: 01/24/2013  Vaping Use   Vaping Use: Never used  Substance and Sexual Activity   Alcohol use: No    Alcohol/week: 0.0 standard drinks   Drug use: No   Sexual activity: Not Currently    Birth control/protection: None  Other Topics Concern   Not on file  Social History Narrative   Not on file   Social Determinants of Health   Financial Resource Strain: Low Risk    Difficulty of Paying Living Expenses: Not hard at all  Food Insecurity: No Food Insecurity   Worried About Charity fundraiser in the Last Year: Never true   [place] in the Last Year: Never true  Transportation Needs: No Transportation Needs   Lack of Transportation (Medical): No   Lack of Transportation (Non-Medical): No  Physical Activity: Inactive   Days of Exercise per Week: 0 days   Minutes of Exercise per Session: 0 min  Stress: No Stress Concern Present   Feeling of Stress : Not at all  Social Connections: Moderately Isolated   Frequency of Communication with Friends and Family: More than three times a week   Frequency of Social Gatherings with Friends and Family: Never   Attends Religious  Services: Never   Marine scientist or Organizations: No   Attends Music therapist: Never   Marital Status: Married    Tobacco Counseling Counseling given: Yes   Clinical Intake:  Pre-visit preparation completed: Yes  Pain : No/denies pain Pain Score: 0-No pain     BMI - recorded: 35.15 Nutritional Status: BMI > 30  Obese Nutritional Risks: None Diabetes: Yes CBG done?: No Did pt. bring in CBG monitor from home?: No  How often do you need to have someone help you when you read instructions, pamphlets, or other written materials from your doctor or pharmacy?: 1 - Never What is the last grade level you completed in school?: 12  Diabetic? yes  Interpreter Needed?: No      Activities of Daily Living In your present state of health, do you have any difficulty performing the following activities: 06/20/2020  Hearing? N  Vision? N  Difficulty concentrating or making decisions? Y  Walking or climbing stairs? Y  Dressing or bathing? Y  Doing errands, shopping? Y  Preparing Food and eating ? Y  Using the Toilet? N  In the past six months, have you accidently leaked urine? N  Do you have problems with loss of bowel control? N  Managing your Medications? N  Managing your Finances? N  Housekeeping or managing your Housekeeping? Y  Some recent data might be hidden    Patient Care Team: Fayrene Helper, MD as PCP - General Domenic Polite Aloha Gell, MD as PCP - Cardiology (Cardiology) Caprice Beaver, DPM as Consulting Physician (Podiatry) Satira Sark, MD as Consulting Physician (Cardiology) Aviva Signs, MD as Consulting Physician (General Surgery)  Indicate any recent Medical Services you may have received from other than [facility] providers in the past year (date may be approximate).     Assessment:   This is a routine wellness examination for Jessi.  Hearing/Vision screen No exam data present  Dietary issues and exercise activities discussed: Current Exercise Habits: The patient does not participate in regular exercise at present, Exercise limited by: orthopedic condition(s)  Goals     Exercise 3x per week (30 min per time)     Recommend starting a routine exercise program at least 3 days a week for 30-45 minutes at a time as tolerated.       Depression Screen PHQ 2/9 Scores 06/20/2020 06/20/2020 05/13/2020 06/20/2019 04/26/2019 05/16/2018 06/13/2017  PHQ - 2 Score 0 0 0 0 0 0 0    Fall Risk Fall Risk  06/20/2020 05/13/2020 01/08/2020 06/20/2019 04/26/2019  Falls in the past year? 1 1 1 1 1   Number falls in past yr: 1 1 0 1 0  Injury with Fall? 1 1 0 1 1  Comment - - - - -  Risk Factor Category  - - - - -  Risk for fall due to : - - Impaired balance/gait;Impaired mobility;History of fall(s) - Impaired mobility  Follow up - - - - -    Any stairs in or around the home? No  If so, are there any without handrails? No  Home free of loose throw rugs in walkways, pet beds, electrical cords, etc? Yes  Adequate lighting in your home to reduce risk of falls? Yes   ASSISTIVE DEVICES UTILIZED TO PREVENT FALLS:  Life alert? No  Use of a cane, walker or w/c? Yes  Grab bars in the bathroom? Yes  Shower chair or bench in shower? No  Elevated toilet seat or  a handicapped toilet? Yes   TIMED UP AND GO:  Was the test performed? No .      Cognitive Function:     6CIT  Screen 06/20/2020 06/20/2019 06/14/2018 06/13/2017  What Year? 0 points 0 points 0 points 0 points  What month? 0 points 0 points 0 points 0 points  What time? 3 points 0 points 0 points 0 points  Count back from 20 0 points 0 points 2 points 0 points  Months in reverse 0 points 0 points 0 points 0 points  Repeat phrase 0 points 0 points 0 points 0 points  Total Score 3 0 2 0    Immunizations Immunization History  Administered Date(s) Administered   Fluad Quad(high Dose 65+) 05/15/2019   H1N1 08/27/2008, 10/16/2008   Influenza Split 07/06/2011, 06/07/2012   Influenza Whole 06/14/2007, 06/24/2009, 06/30/2010   Influenza,inj,Quad PF,6+ Mos 06/18/2013, 06/18/2014, 06/02/2015, 06/10/2016, 06/13/2017, 05/16/2018   Moderna SARS-COVID-2 Vaccination 10/03/2019, 10/24/2019   Pneumococcal Conjugate-13 04/10/2014   Pneumococcal Polysaccharide-23 01/04/2007   Td 04/15/2004   Tdap 08/15/2014   Zoster 01/10/2007    TDAP status: Up to date Flu Vaccine status: Declined, Education has been provided regarding the importance of this vaccine but patient still declined. Advised may receive this vaccine at local pharmacy or Health Dept. Aware to provide a copy of the vaccination record if obtained from local pharmacy or Health Dept. Verbalized acceptance and understanding. Pneumococcal vaccine status: Declined,  Education has been provided regarding the importance of this vaccine but patient still declined. Advised may receive this vaccine at local pharmacy or Health Dept. Aware to provide a copy of the vaccination record if obtained from local pharmacy or Health Dept. Verbalized acceptance and understanding.  Covid-19 vaccine status: Completed vaccines  Qualifies for Shingles Vaccine? Yes   Zostavax completed No   Shingrix Completed?: No.    Education has been provided regarding the importance of this vaccine. Patient has been advised to call insurance company to determine out of pocket expense if  they have not yet received this vaccine. Advised may also receive vaccine at local pharmacy or Health Dept. Verbalized acceptance and understanding.  Screening Tests Health Maintenance  Topic Date Due   INFLUENZA VACCINE  04/13/2020   COLONOSCOPY  07/14/2020   TETANUS/TDAP  08/15/2024   COVID-19 Vaccine  Completed   PNA vac Low Risk Adult  Completed    Health Maintenance  Health Maintenance Due  Topic Date Due   INFLUENZA VACCINE  04/13/2020    Colorectal cancer screening: Completed 07/15/2015. Repeat every no longer needed after this one years  Lung Cancer Screening: (Low Dose CT Chest recommended if Age 32-80 years, 30 pack-year currently smoking OR have quit w/in 15years.) does not qualify.   Lung Cancer Screening Referral:   Additional Screening:  Hepatitis C Screening: does not qualify;   Vision Screening: Recommended annual ophthalmology exams for early detection of glaucoma and other disorders of the eye. Is the patient up to date with their annual eye exam?  No  Who is the provider or what is the name of the office in which the patient attends annual eye exams? Dr.Shapiro If pt is not established with a provider, would they like to be referred to a provider to establish care? No .   Dental Screening: Recommended annual dental exams for proper oral hygiene  Community Resource Referral / Chronic Care Management: CRR required this visit?  No   CCM required this visit?  No  Plan:     1. Encounter for Medicare annual wellness exam  I have personally reviewed and noted the following in the patients chart:    Medical and social history  Use of alcohol, tobacco or illicit drugs   Current medications and supplements  Functional ability and status  Nutritional status  Physical activity  Advanced directives  List of other physicians  Hospitalizations, surgeries, and ER visits in previous 12 months  Vitals  Screenings to include cognitive,  depression, and falls  Referrals and appointments  In addition, I have reviewed and discussed with patient certain preventive protocols, quality metrics, and best practice recommendations. A written personalized care plan for preventive services as well as general preventive health recommendations were provided to patient.     Perlie Mayo, NP   06/20/2020    I provided 20 minutes of non-face-to-face time during this encounter.

## 2020-06-27 ENCOUNTER — Ambulatory Visit (INDEPENDENT_AMBULATORY_CARE_PROVIDER_SITE_OTHER): Payer: Medicare Other

## 2020-06-27 ENCOUNTER — Other Ambulatory Visit: Payer: Self-pay

## 2020-06-27 DIAGNOSIS — Z23 Encounter for immunization: Secondary | ICD-10-CM | POA: Diagnosis not present

## 2020-07-04 DIAGNOSIS — L851 Acquired keratosis [keratoderma] palmaris et plantaris: Secondary | ICD-10-CM | POA: Diagnosis not present

## 2020-07-04 DIAGNOSIS — B351 Tinea unguium: Secondary | ICD-10-CM | POA: Diagnosis not present

## 2020-07-04 DIAGNOSIS — I739 Peripheral vascular disease, unspecified: Secondary | ICD-10-CM | POA: Diagnosis not present

## 2020-07-15 DIAGNOSIS — Z23 Encounter for immunization: Secondary | ICD-10-CM | POA: Diagnosis not present

## 2020-08-18 ENCOUNTER — Other Ambulatory Visit: Payer: Self-pay

## 2020-08-18 ENCOUNTER — Ambulatory Visit (HOSPITAL_COMMUNITY)
Admission: RE | Admit: 2020-08-18 | Discharge: 2020-08-18 | Disposition: A | Discharge status: Home / Self Care | Payer: Medicare Other | Source: Ambulatory Visit | Attending: Cardiology | Admitting: Cardiology

## 2020-08-18 DIAGNOSIS — I350 Nonrheumatic aortic (valve) stenosis: Secondary | ICD-10-CM | POA: Diagnosis not present

## 2020-08-18 LAB — ECHOCARDIOGRAM COMPLETE
AR max vel: 1.15 cm2
AV Area VTI: 1.21 cm2
AV Area mean vel: 1.2 cm2
AV Mean grad: 31.9 mmHg
AV Peak grad: 54.4 mmHg
Ao pk vel: 3.69 m/s
Area-P 1/2: 2.34 cm2
P 1/2 time: 449 msec
S' Lateral: 3 cm

## 2020-08-18 NOTE — Progress Notes (Signed)
*  PRELIMINARY RESULTS* Echocardiogram 2D Echocardiogram has been performed.  Craig Fields 08/18/2020, 10:31 AM

## 2020-08-27 ENCOUNTER — Other Ambulatory Visit: Payer: Self-pay | Admitting: Family Medicine

## 2020-09-17 ENCOUNTER — Telehealth (INDEPENDENT_AMBULATORY_CARE_PROVIDER_SITE_OTHER): Payer: Medicare Other | Admitting: Family Medicine

## 2020-09-17 ENCOUNTER — Other Ambulatory Visit: Payer: Self-pay

## 2020-09-17 ENCOUNTER — Encounter: Payer: Self-pay | Admitting: Family Medicine

## 2020-09-17 VITALS — BP 166/72 | HR 55 | Ht 69.0 in | Wt 240.0 lb

## 2020-09-17 DIAGNOSIS — E785 Hyperlipidemia, unspecified: Secondary | ICD-10-CM

## 2020-09-17 DIAGNOSIS — I10 Essential (primary) hypertension: Secondary | ICD-10-CM

## 2020-09-17 NOTE — Patient Instructions (Addendum)
F/U in office with MD in 5 mon ths, call if you need me sooner  Please get fasting labs ordered in the next 2 weeks  Please be careful noy  To fall  Think about what you will eat, plan ahead. Choose " clean, green, fresh or frozen" over canned, processed or packaged foods which are more sugary, salty and fatty. 70 to 75% of food eaten should be vegetables and fruit. Three meals at set times with snacks allowed between meals, but they must be fruit or vegetables. Aim to eat over a 12 hour period , example 7 am to 7 pm, and STOP after  your last meal of the day. Drink water,generally about 64 ounces per day, no other drink is as healthy. Fruit juice is best enjoyed in a healthy way, by EATING the fruit. Thanks for choosing [facility], we consider it a privelige to serve you.  Best for 2022

## 2020-09-17 NOTE — Progress Notes (Signed)
Virtual Visit via Telephone Note  I connected with Craig Fields on 09/17/20 at  1:00 PM EST by telephone and verified that I am speaking with the correct person using two identifiers.  Location: Patient: home Provider: work   I discussed the limitations, risks, security and privacy concerns of performing an evaluation and management service by telephone and the availability of in person appointments. I also discussed with the patient that there may be a patient responsible charge related to this service. The patient expressed understanding and agreed to proceed.   History of Present Illness: Feels well overall Denies recent fever or chills. Denies sinus pressure, nasal congestion, ear pain or sore throat. Denies chest congestion, productive cough or wheezing. Denies chest pains, palpitations, PND , orthopnea and leg swelling Denies abdominal pain, nausea, vomiting,diarrhea or constipation.   Denies dysuria, frequency, hesitancy or incontinence. Chronic  joint pain, swelling and limitation in mobility.Right knee Right arm getting numb at night Denies headaches, seizures, Denies depression, anxiety or insomnia. Denies skin break down or rash.       Observations/Objective:  BP (!) 166/72   Pulse (!) 55   Ht 5\' 9"  (1.753 m)   Wt 240 lb (108.9 kg)   BMI 35.44 kg/m  Good communication with no confusion and intact memory. Alert and oriented x 3 No signs of respiratory distress during speech   Assessment and Plan:  Essential hypertension, benign Elevated value reported, needs in office re eval and management DASH diet and commitment to daily physical activity for a minimum of 30 minutes discussed and encouraged, as a part of hypertension management. The importance of attaining a healthy weight is also discussed.  BP/Weight 09/17/2020 06/20/2020 05/13/2020 01/08/2020 12/13/2019 06/20/2019 04/26/2019  Systolic BP 166 157 - 148 154 04/28/2019 122  Diastolic BP 72 78 - 78 77 74 74  Wt. (Lbs)  240 238 232 240 230 240 240  BMI 35.44 35.15 33.29 35.44 33.97 35.44 35.44       Hyperlipidemia LDL goal <70 Hyperlipidemia:Low fat diet discussed and encouraged.   Lipid Panel  Lab Results  Component Value Date   CHOL 141 01/01/2020   HDL 40 01/01/2020   LDLCALC 78 01/01/2020   TRIG 135 01/01/2020   CHOLHDL 3.5 01/01/2020   Updated lab needed at/ before next visit.     Morbid obesity (HCC) Obesity associated with hypertension, hyperlipidemia, arthritis and cVA Patient re-educated about  the importance of commitment to a  minimum of 150 minutes of exercise per week as able.  The importance of healthy food choices with portion control discussed, as well as eating regularly and within a 12 hour window most days. The need to choose "clean , green" food 50 to 75% of the time is discussed, as well as to make water the primary drink and set a goal of 64 ounces water daily.    Weight /BMI 09/17/2020 06/20/2020 05/13/2020  WEIGHT 240 lb 238 lb 232 lb  HEIGHT 5\' 9"  5\' 9"  5\' 10"   BMI 35.44 kg/m2 35.15 kg/m2 33.29 kg/m2      Hemiplegia, late effect of cerebrovascular disease (HCC) Home safety and fall risk reduction discused   Follow Up Instructions:    I discussed the assessment and treatment plan with the patient. The patient was provided an opportunity to ask questions and all were answered. The patient agreed with the plan and demonstrated an understanding of the instructions.   The patient was advised to call back or seek an in-person evaluation  if the symptoms worsen or if the condition fails to improve as anticipated.  I provided 22 minutes of non-face-to-face time during this encounter.   Tula Nakayama, MD

## 2020-09-19 DIAGNOSIS — L851 Acquired keratosis [keratoderma] palmaris et plantaris: Secondary | ICD-10-CM | POA: Diagnosis not present

## 2020-09-19 DIAGNOSIS — I739 Peripheral vascular disease, unspecified: Secondary | ICD-10-CM | POA: Diagnosis not present

## 2020-09-19 DIAGNOSIS — B351 Tinea unguium: Secondary | ICD-10-CM | POA: Diagnosis not present

## 2020-09-22 ENCOUNTER — Ambulatory Visit: Payer: Medicare Other | Admitting: Family Medicine

## 2020-09-26 ENCOUNTER — Encounter: Payer: Self-pay | Admitting: Family Medicine

## 2020-09-26 NOTE — Assessment & Plan Note (Signed)
Hyperlipidemia:Low fat diet discussed and encouraged.   Lipid Panel  Lab Results  Component Value Date   CHOL 141 01/01/2020   HDL 40 01/01/2020   LDLCALC 78 01/01/2020   TRIG 135 01/01/2020   CHOLHDL 3.5 01/01/2020   Updated lab needed at/ before next visit.

## 2020-09-26 NOTE — Assessment & Plan Note (Addendum)
Obesity associated with hypertension, hyperlipidemia, arthritis and cVA Patient re-educated about  the importance of commitment to a  minimum of 150 minutes of exercise per week as able.  The importance of healthy food choices with portion control discussed, as well as eating regularly and within a 12 hour window most days. The need to choose "clean , green" food 50 to 75% of the time is discussed, as well as to make water the primary drink and set a goal of 64 ounces water daily.    Weight /BMI 09/17/2020 06/20/2020 05/13/2020  WEIGHT 240 lb 238 lb 232 lb  HEIGHT 5\' 9"  5\' 9"  5\' 10"   BMI 35.44 kg/m2 35.15 kg/m2 33.29 kg/m2

## 2020-09-26 NOTE — Assessment & Plan Note (Addendum)
Home safety and fall risk reduction discussed, reports marked improvement in gait with new custom built shoes

## 2020-09-26 NOTE — Assessment & Plan Note (Signed)
Elevated value reported, needs in office re eval and management DASH diet and commitment to daily physical activity for a minimum of 30 minutes discussed and encouraged, as a part of hypertension management. The importance of attaining a healthy weight is also discussed.  BP/Weight 09/17/2020 06/20/2020 05/13/2020 01/08/2020 12/13/2019 06/20/2019 3/55/7322  Systolic BP 025 427 - 062 376 283 151  Diastolic BP 72 78 - 78 77 74 74  Wt. (Lbs) 240 238 232 240 230 240 240  BMI 35.44 35.15 33.29 35.44 33.97 35.44 35.44

## 2020-10-14 DIAGNOSIS — I10 Essential (primary) hypertension: Secondary | ICD-10-CM | POA: Diagnosis not present

## 2020-10-14 DIAGNOSIS — Z125 Encounter for screening for malignant neoplasm of prostate: Secondary | ICD-10-CM | POA: Diagnosis not present

## 2020-10-14 DIAGNOSIS — E559 Vitamin D deficiency, unspecified: Secondary | ICD-10-CM | POA: Diagnosis not present

## 2020-10-14 DIAGNOSIS — E785 Hyperlipidemia, unspecified: Secondary | ICD-10-CM | POA: Diagnosis not present

## 2020-10-15 LAB — CBC
Hematocrit: 42.2 % (ref 37.5–51.0)
Hemoglobin: 14.5 g/dL (ref 13.0–17.7)
MCH: 32.5 pg (ref 26.6–33.0)
MCHC: 34.4 g/dL (ref 31.5–35.7)
MCV: 95 fL (ref 79–97)
Platelets: 173 10*3/uL (ref 150–450)
RBC: 4.46 x10E6/uL (ref 4.14–5.80)
RDW: 12.8 % (ref 11.6–15.4)
WBC: 6.8 10*3/uL (ref 3.4–10.8)

## 2020-10-15 LAB — LIPID PANEL
Chol/HDL Ratio: 3.3 ratio (ref 0.0–5.0)
Cholesterol, Total: 159 mg/dL (ref 100–199)
HDL: 48 mg/dL (ref >39)
LDL Chol Calc (NIH): 84 mg/dL (ref 0–99)
Triglycerides: 158 mg/dL — ABNORMAL HIGH (ref 0–149)
VLDL Cholesterol Cal: 27 mg/dL (ref 5–40)

## 2020-10-15 LAB — CMP14+EGFR
ALT: 17 IU/L (ref 0–44)
AST: 25 IU/L (ref 0–40)
Albumin/Globulin Ratio: 2 (ref 1.2–2.2)
Albumin: 4.7 g/dL — ABNORMAL HIGH (ref 3.6–4.6)
Alkaline Phosphatase: 46 IU/L (ref 44–121)
BUN/Creatinine Ratio: 21 (ref 10–24)
BUN: 24 mg/dL (ref 8–27)
Bilirubin Total: 0.4 mg/dL (ref 0.0–1.2)
CO2: 22 mmol/L (ref 20–29)
Calcium: 9.8 mg/dL (ref 8.6–10.2)
Chloride: 101 mmol/L (ref 96–106)
Creatinine, Ser: 1.12 mg/dL (ref 0.76–1.27)
GFR calc Af Amer: 71 mL/min/{1.73_m2} (ref >59)
GFR calc non Af Amer: 61 mL/min/{1.73_m2} (ref >59)
Globulin, Total: 2.4 g/dL (ref 1.5–4.5)
Glucose: 105 mg/dL — ABNORMAL HIGH (ref 65–99)
Potassium: 4.3 mmol/L (ref 3.5–5.2)
Sodium: 139 mmol/L (ref 134–144)
Total Protein: 7.1 g/dL (ref 6.0–8.5)

## 2020-10-15 LAB — PSA: Prostate Specific Ag, Serum: 3.6 ng/mL (ref 0.0–4.0)

## 2020-10-15 LAB — VITAMIN D 25 HYDROXY (VIT D DEFICIENCY, FRACTURES): Vit D, 25-Hydroxy: 42.2 ng/mL (ref 30.0–100.0)

## 2020-10-15 LAB — TSH: TSH: 3.44 u[IU]/mL (ref 0.450–4.500)

## 2020-11-04 NOTE — Progress Notes (Signed)
Virtual Visit via Telephone Note   This visit type was conducted due to national recommendations for restrictions regarding the COVID-19 Pandemic (e.g. social distancing) in an effort to limit this patient's exposure and mitigate transmission in our community.  Due to his co-morbid illnesses, this patient is at least at moderate risk for complications without adequate follow up.  This format is felt to be most appropriate for this patient at this time.  The patient did not have access to video technology/had technical difficulties with video requiring transitioning to audio format only (telephone).  All issues noted in this document were discussed and addressed.  No physical exam could be performed with this format.  Please refer to the patient's chart for his  consent to telehealth for [facility].    Date:  11/10/2020   ID:  Adolph Clutter [place], DOB May 17, 1941, MRN [id number] The patient was identified using 2 identifiers.  Patient Location: Home Provider Location: Office/Clinic   PCP:  Fayrene Helper, [place]  Cardiologist:  Rozann Lesches, MD   Advanced Practice Provider:  No care team member to display Electrophysiologist:  None   :[id number]}   Evaluation Performed:  Follow-Up Visit  Chief Complaint:  f/u  History of Present Illness:    Rusell Meneely Deshler is a 80 y.o. male with with history of CAD status post CABG in 2004, moderate aortic stenosis, hyperlipidemia, previous CVA 2003, wears boot on foot.  Patient last saw Dr. Domenic Polite 02/2020 at which time he is doing well.  2D echo 08/18/2020 normal LVEF 60 to 65%, moderate LVH, grade 1 DD, severely calcified aortic valve with moderate aortic regurgitation and moderate aortic stenosis mean gradient 31.9 mmHg peak gradient 54.4 mmHg, AVA by VTI 1.  To 1 cm which had progressed.  Patient changed to virtual visit. Has chronic DOE that he thinks is unchanged. Not very active. Just walks in the house.  Gets weak from old age. Goes for a ride in the car sometimes but very inactive.  The patient does not have symptoms concerning for COVID-19 infection (fever, chills, cough, or new shortness of breath).    Past Medical History:  Diagnosis Date  . Anxiety disorder   . Chronic back pain   . Colon cancer [facility]) May 2007  . Coronary atherosclerosis of native coronary artery    Multivessel status post CABG  . 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 Essential hypertension   . Hematuria 04/30/2019   New complaint, does nott have pain of kidney stones, needs to submit UA for testing asap and needs urology eval  . Hyperlipidemia   . IGT (impaired glucose tolerance)   . Obesity   . OSA (obstructive sleep apnea)   . Seizures ([place])    Age 80 or 3, no meds and no reoccurances  . Skin lesion 12/07/2012  . Skin tag 11/16/2011  . Stroke [facility])    Past Surgical History:  Procedure Laterality Date  . COLONOSCOPY N/A 07/15/2015   Procedure: COLONOSCOPY;  Surgeon: Aviva Signs, MD;  Location: [facility] ENDO SUITE;  Service: Gastroenterology;  Laterality: N/A;  . CORONARY ARTERY BYPASS GRAFT  2004   x4  . INCISIONAL HERNIA REPAIR N/A 01/01/2013   Procedure: HERNIA REPAIR INCISIONAL;  Surgeon: Jamesetta So, MD;  Location: [facility] ORS;  Service: General;  Laterality: N/A;  umbilical area  . INSERTION OF MESH N/A 01/01/2013   Procedure: INSERTION OF MESH;  Surgeon:  Jamesetta So, MD;  Location: [facility] ORS;  Service: General;  Laterality: N/A;  . Left inguinal  hernia repair  2005  . [name]  2007     Current Meds  Medication Sig  . acetaminophen (TYLENOL) 650 MG CR tablet Take by mouth.   Marland Kitchen aspirin 81 MG EC tablet Take 81 mg by mouth every evening.  . Cholecalciferol (VITAMIN D) 2000 units CAPS Take 1 capsule by mouth daily.  . clopidogrel (PLAVIX) 75 MG tablet TAKE 1 TABLET BY MOUTH EVERY DAY  . Coenzyme Q10 (CO Q-10) 200 MG CAPS Take 1 tablet by mouth daily.   Marland Kitchen KLOR-CON M20 20 MEQ tablet  TAKE 2 TABLETS BY MOUTH DAILY  . lisinopril (ZESTRIL) 10 MG tablet TAKE 1 TABLET BY MOUTH EVERY DAY  . metoprolol tartrate (LOPRESSOR) 50 MG tablet TAKE 1/2 TABLET BY MOUTH 2 (TWO) TIMES DAILY.  . Multiple Vitamins-Minerals ([employer] SPECTRAVITE) TABS Take 1 tablet by mouth every evening.  . Omega-3 Fatty Acids (FISH OIL) 1200 MG CAPS Take 2 capsules by mouth at bedtime. 1200 mg  . polyethylene glycol powder (GLYCOLAX/MIRALAX) powder MIX TO TAKE 17 G DAILY AS DIRECTED (Patient taking differently: Take 17 g by mouth every morning.)  . simvastatin (ZOCOR) 20 MG tablet TAKE 1 TABLET BY MOUTH EVERYDAY AT BEDTIME  . sodium chloride (OCEAN) 0.65 % SOLN nasal spray Place 1 spray into both nostrils as needed for congestion.  . triamterene-hydrochlorothiazide (MAXZIDE-25) 37.5-25 MG tablet TAKE 1 TABLET BY MOUTH EVERY DAY IN THE MORNING  . UNABLE TO FIND Commode assist chair  . UNABLE TO FIND Bilateral new shoes  Left AFO with calliber plate  Dx I77.824  . UNABLE TO FIND Right custom torch walker x 1  Dx M35.361     Allergies:   Bee venom and Niacin   Social History   Tobacco Use  . Smoking status: Former Smoker    Packs/day: 0.50    Years: 5.00    Pack years: 2.50    Types: Cigarettes    Quit date: 12/27/1980    Years since quitting: 39.8  . Smokeless tobacco: Former Systems developer    Types: [place] date: 01/24/2013  Vaping Use  . Vaping Use: Never used  Substance Use Topics  . Alcohol use: No    Alcohol/week: 0.0 standard drinks  . Drug use: No     Family Hx: [name] family history includes Actinic keratosis in his sister; Anxiety disorder in his son; COPD in his sister; Heart disease in his brother, brother, and father; Obesity in his son; Pancreatic cancer in his mother; Prostate cancer in his father; Stroke in his brother.  ROS:   Please see the history of present illness.      All other systems reviewed and are negative.   Prior CV studies:   The following studies were  reviewed today:  2D echo 08/18/2020 IMPRESSIONS     1. Left ventricular ejection fraction, by estimation, is 60 to 65%. The  left ventricle has normal function. The left ventricle has no regional  wall motion abnormalities. There is moderate left ventricular hypertrophy.  Left ventricular diastolic  parameters are consistent with Grade I diastolic dysfunction (impaired  relaxation). Elevated left atrial pressure.   2. Right ventricular systolic function is normal. The right ventricular  size is normal. There is normal pulmonary artery systolic pressure.   3. Left atrial size was moderately dilated.   4. The mitral valve is normal in  structure. Trivial mitral valve  regurgitation. No evidence of mitral stenosis. Moderate mitral annular  calcification.   5. The aortic valve is tricuspid. There is severe calcifcation of the  aortic valve. There is severe thickening of the aortic valve. Aortic valve  regurgitation is moderate. Moderate aortic valve stenosis. Aortic valve  mean gradient measures 31.9 mmHg.  Aortic valve peak gradient measures 54.4 mmHg. Aortic valve area, by VTI  measures 1.21 cm.   6. The inferior vena cava is normal in size with greater than 50%  respiratory variability, suggesting right atrial pressure of 3 mmHg.   IMPRESSIONS     1. Left ventricular ejection fraction, by estimation, is 60 to 65%. The  left ventricle has normal function. The left ventricle has no regional  wall motion abnormalities. There is moderate left ventricular hypertrophy.  Left ventricular diastolic  parameters are consistent with Grade I diastolic dysfunction (impaired  relaxation). Elevated left atrial pressure.   2. Right ventricular systolic function is normal. The right ventricular  size is normal. There is normal pulmonary artery systolic pressure.   3. Left atrial size was moderately dilated.   4. The mitral valve is normal in structure. Trivial mitral valve  regurgitation. No  evidence of mitral stenosis. Moderate mitral annular  calcification.   5. The aortic valve is tricuspid. There is severe calcifcation of the  aortic valve. There is severe thickening of the aortic valve. Aortic valve  regurgitation is moderate. Moderate aortic valve stenosis. Aortic valve  mean gradient measures 31.9 mmHg.  Aortic valve peak gradient measures 54.4 mmHg. Aortic valve area, by VTI  measures 1.21 cm.   6. The inferior vena cava is normal in size with greater than 50%  respiratory variability, suggesting right atrial pressure of 3 mmHg.  Echocardiogram 06/26/2018: Study Conclusions   - Left ventricle: The cavity size was normal. There was mild    concentric and moderate proximal septal hypertrophy. Systolic    function was normal. The estimated ejection fraction was in the    range of 60% to 65%. Wall motion was normal; there were no    regional wall motion abnormalities. Doppler parameters are    consistent with abnormal left ventricular relaxation (grade 1    diastolic dysfunction). Doppler parameters are consistent with    high ventricular filling pressure.  - Aortic valve: Moderately calcified annulus. Trileaflet;    moderately calcified leaflets. There was moderate stenosis. There    was mild regurgitation. Peak velocity (S): 301 cm/s. Mean    gradient (S): 18 mm Hg. Valve area (VTI): 1.4 cm^2. Valve area    (Vmax): 1.17 cm^2. Valve area (Vmean): 1.27 cm^2.  - Mitral valve: Moderately thickened annulus.  - Right ventricle: Systolic function was mildly reduced.  - Atrial septum: There was increased thickness of the septum,    consistent with lipomatous hypertrophy. No defect or patent    foramen ovale was identified.         Labs/Other Tests and Data Reviewed:    EKG:  No ECG reviewed.  Recent Labs: 10/14/2020: ALT 17; BUN 24; Creatinine, Ser 1.12; Hemoglobin 14.5; Platelets 173; Potassium 4.3; Sodium 139; TSH 3.440   Recent Lipid Panel Lab Results   Component Value Date/Time   CHOL 159 10/14/2020 10:01 AM   TRIG 158 (H) 10/14/2020 10:01 AM   HDL 48 10/14/2020 10:01 AM   CHOLHDL 3.3 10/14/2020 10:01 AM   CHOLHDL 3.5 01/01/2020 04:20 PM   LDLCALC 84 10/14/2020 10:01 AM  [place] 78 01/01/2020 04:20 PM    Wt Readings from Last 3 Encounters:  11/10/20 238 lb (108 kg)  09/17/20 240 lb (108.9 kg)  06/20/20 238 lb (108 kg)     Risk Assessment/Calculations:      Objective:    Vital Signs:  BP (!) 177/81   Pulse (!) 54   Ht 5\' 10"  (1.778 m)   Wt 238 lb (108 kg)   BMI 34.15 kg/m    VITAL SIGNS:  reviewed  ASSESSMENT & PLAN:    CAD status post CABG in 2004 on Plavix, aspirin, metoprolol, Zocor and lisinopril-no angina-no bleeding problems.  Aortic stenosis moderate/moderate aortic insufficiency mean gradient 31.9 mmHg 08/18/20,  up from 18 mmHg on echo in 2019- chronic DOE that is unchanged.   HTN-BP up this am but he was in a hurry. Usually 130/70's. He'll keep track of it and call us back. 2 gm sodium diet.   Hyperlipidemia LDL 78 12/2019 on Zocor   Frequent falls in past so restricts walking. Says it's from his CVA       COVID-19 Education: The signs and symptoms of COVID-19 were discussed with the patient and how to seek care for testing (follow up with PCP or arrange E-visit).   The importance of social distancing was discussed today.  Time:   Today, I have spent 11: minutes with the patient with telehealth technology discussing the above problems.     Medication Adjustments/Labs and Tests Ordered: Current medicines are reviewed at length with the patient today.  Concerns regarding medicines are outlined above.   Tests Ordered: Orders Placed This Encounter  Procedures  . ECHOCARDIOGRAM COMPLETE    Medication Changes: No orders of the defined types were placed in this encounter.   Follow Up:  In Person in 6 month(s) Dr. Domenic Polite  Signed, Ermalinda Barrios, PA-C  11/10/2020 11:53 AM    [place]

## 2020-11-09 ENCOUNTER — Other Ambulatory Visit: Payer: Self-pay | Admitting: Family Medicine

## 2020-11-10 ENCOUNTER — Telehealth (INDEPENDENT_AMBULATORY_CARE_PROVIDER_SITE_OTHER): Payer: Medicare Other | Admitting: Physician Assistant

## 2020-11-10 ENCOUNTER — Encounter: Payer: Self-pay | Admitting: Physician Assistant

## 2020-11-10 ENCOUNTER — Other Ambulatory Visit: Payer: Self-pay

## 2020-11-10 VITALS — BP 177/81 | HR 54 | Ht 70.0 in | Wt 238.0 lb

## 2020-11-10 DIAGNOSIS — R296 Repeated falls: Secondary | ICD-10-CM | POA: Diagnosis not present

## 2020-11-10 DIAGNOSIS — I10 Essential (primary) hypertension: Secondary | ICD-10-CM

## 2020-11-10 DIAGNOSIS — I350 Nonrheumatic aortic (valve) stenosis: Secondary | ICD-10-CM

## 2020-11-10 DIAGNOSIS — I25810 Atherosclerosis of coronary artery bypass graft(s) without angina pectoris: Secondary | ICD-10-CM

## 2020-11-10 DIAGNOSIS — E7849 Other hyperlipidemia: Secondary | ICD-10-CM

## 2020-11-10 NOTE — Patient Instructions (Signed)
Medication Instructions:  Your physician recommends that you continue on your current medications as directed. Please refer to the Current Medication list given to you today.  *If you need a refill on your cardiac medications before your next appointment, please call your pharmacy*   Lab Work: None today If you have labs (blood work) drawn today and your tests are completely normal, you will receive your results only by: Marland Kitchen MyChart Message (if you have MyChart) OR . A paper copy in the mail If you have any lab test that is abnormal or we need to change your treatment, we will call you to review the results.   Testing/Procedures: Your physician has requested that you have an echocardiogram in [place]. Echocardiography is a painless test that uses sound waves to create images of your heart. It provides your doctor with information about the size and shape of your heart and how well your heart's chambers and valves are working. This procedure takes approximately one hour. There are no restrictions for this procedure.     Follow-Up: At [facility], you and your health needs are our priority.  As part of our continuing mission to provide you with exceptional heart care, we have created designated Provider Care Teams.  These Care Teams include your primary Cardiologist (physician) and Advanced Practice Providers (APPs -  Physician Assistants and Nurse Practitioners) who all work together to provide you with the care you need, when you need it.  We recommend signing up for the patient portal called "MyChart".  Sign up information is provided on this After Visit Summary.  MyChart is used to connect with patients for Virtual Visits (Telemedicine).  Patients are able to view lab/test results, encounter notes, upcoming appointments, etc.  Non-urgent messages can be sent to your provider as well.   To learn more about what you can do with MyChart, go to [email or web address].    Your next appointment:    4 month(s)  The format for your next appointment:   In Person  Provider:   Rozann Lesches, MD   Other Instructions Please follow a 2 gram sodium diet.       Two Gram Sodium Diet 2000 mg  What is Sodium? Sodium is a mineral found naturally in many foods. The most significant source of sodium in the diet is table salt, which is about 40% sodium.  Processed, convenience, and preserved foods also contain a large amount of sodium.  The body needs only 500 mg of sodium daily to function,  A normal diet provides more than enough sodium even if you do not use salt.  Why Limit Sodium? A build up of sodium in the body can cause thirst, increased blood pressure, shortness of breath, and water retention.  Decreasing sodium in the diet can reduce edema and risk of heart attack or stroke associated with high blood pressure.  Keep in mind that there are many other factors involved in these health problems.  Heredity, obesity, lack of exercise, cigarette smoking, stress and what you eat all play a role.  General Guidelines:  Do not add salt at the table or in cooking.  One teaspoon of salt contains over 2 grams of sodium.  Read food labels  Avoid processed and convenience foods  Ask your dietitian before eating any foods not dicussed in the menu planning guidelines  Consult your physician if you wish to use a salt substitute or a sodium containing medication such as antacids.  Limit milk and milk products to  16 oz (2 cups) per day.  Shopping Hints:  READ LABELS!! "Dietetic" does not necessarily mean low sodium.  Salt and other sodium ingredients are often added to foods during processing.   Menu Planning Guidelines Food Group Choose More Often Avoid  Beverages (see also the milk group All fruit juices, low-sodium, salt-free vegetables juices, low-sodium carbonated beverages Regular vegetable or tomato juices, commercially softened water used for drinking or cooking  Breads and  Cereals Enriched white, wheat, rye and pumpernickel bread, hard rolls and dinner rolls; muffins, cornbread and waffles; most dry cereals, cooked cereal without added salt; unsalted crackers and breadsticks; low sodium or homemade bread crumbs Bread, rolls and crackers with salted tops; quick breads; instant hot cereals; pancakes; commercial bread stuffing; self-rising flower and biscuit mixes; regular bread crumbs or cracker crumbs  Desserts and Sweets Desserts and sweets mad with mild should be within allowance Instant pudding mixes and cake mixes  Fats Butter or margarine; vegetable oils; unsalted salad dressings, regular salad dressings limited to 1 Tbs; light, sour and heavy cream Regular salad dressings containing bacon fat, bacon bits, and salt pork; snack dips made with instant soup mixes or processed cheese; salted nuts  Fruits Most fresh, frozen and canned fruits Fruits processed with salt or sodium-containing ingredient (some dried fruits are processed with sodium sulfites        Vegetables Fresh, frozen vegetables and low- sodium canned vegetables Regular canned vegetables, sauerkraut, pickled vegetables, and others prepared in brine; frozen vegetables in sauces; vegetables seasoned with ham, bacon or salt pork  Condiments, Sauces, Miscellaneous  Salt substitute with physician's approval; pepper, herbs, spices; vinegar, lemon or lime juice; hot pepper sauce; garlic powder, onion powder, low sodium soy sauce (1 Tbs.); low sodium condiments (ketchup, chili sauce, mustard) in limited amounts (1 tsp.) fresh ground horseradish; unsalted tortilla chips, pretzels, potato chips, popcorn, salsa (1/4 cup) Any seasoning made with salt including garlic salt, celery salt, onion salt, and seasoned salt; sea salt, rock salt, kosher salt; meat tenderizers; monosodium glutamate; mustard, regular soy sauce, barbecue, sauce, chili sauce, teriyaki sauce, steak sauce, Worcestershire sauce, and most flavored  vinegars; canned gravy and mixes; regular condiments; salted snack foods, olives, picles, relish, horseradish sauce, catsup   Food preparation: Try these seasonings Meats:    Pork Sage, onion Serve with applesauce  Chicken Poultry seasoning, thyme, parsley Serve with cranberry sauce  Lamb Curry powder, rosemary, garlic, thyme Serve with mint sauce or jelly  Veal Marjoram, basil Serve with current jelly, cranberry sauce  Beef Pepper, bay leaf Serve with dry mustard, unsalted chive butter  Fish [place] leaf, dill Serve with unsalted lemon butter, unsalted parsley butter  Vegetables:    Asparagus Lemon juice   Broccoli Lemon juice   Carrots Mustard dressing parsley, mint, nutmeg, glazed with unsalted butter and sugar   Green beans Marjoram, lemon juice, nutmeg,dill seed   Tomatoes Basil, marjoram, onion   Spice /blend for [employer]" 4 tsp ground thyme 1 tsp ground sage 3 tsp ground rosemary 4 tsp ground marjoram   Test your knowledge 1. A product that says "Salt Free" may still contain sodium. True or False 2. Garlic Powder and Hot Pepper Sauce an be used as alternative seasonings.True or False 3. Processed foods have more sodium than fresh foods.  True or False 4. Canned Vegetables have less sodium than froze True or False  WAYS TO DECREASE YOUR SODIUM INTAKE 1. Avoid the use of added salt in cooking and at the table.  Table salt (and other prepared seasonings which contain salt) is probably one of the greatest sources of sodium in the diet.  Unsalted foods can gain flavor from the sweet, sour, and butter taste sensations of herbs and spices.  Instead of using salt for seasoning, try the following seasonings with the foods listed.  Remember: how you use them to enhance natural food flavors is limited only by your creativity... Allspice-Meat, fish, eggs, fruit, peas, red and yellow vegetables Almond Extract-Fruit baked goods Anise Seed-Sweet breads, fruit, carrots, beets, cottage cheese,  cookies (tastes like licorice) Basil-Meat, fish, eggs, vegetables, rice, vegetables salads, soups, sauces Bay Leaf-Meat, fish, stews, poultry Burnet-Salad, vegetables (cucumber-like flavor) Caraway Seed-Bread, cookies, cottage cheese, meat, vegetables, cheese, rice Cardamon-Baked goods, fruit, soups Celery Powder or seed-Salads, salad dressings, sauces, meatloaf, soup, bread.Do not use  celery salt Chervil-Meats, salads, fish, eggs, vegetables, cottage cheese (parsley-like flavor) Chili Power-Meatloaf, chicken cheese, corn, eggplant, egg dishes Chives-Salads cottage cheese, egg dishes, soups, vegetables, sauces Cilantro-Salsa, casseroles Cinnamon-Baked goods, fruit, pork, lamb, chicken, carrots Cloves-Fruit, baked goods, fish, pot roast, green beans, beets, carrots Coriander-Pastry, cookies, meat, salads, cheese (lemon-orange flavor) Cumin-Meatloaf, fish,cheese, eggs, cabbage,fruit pie (caraway flavor) [employer], fruit, eggs, fish, poultry, cottage cheese, vegetables Dill Seed-Meat, cottage cheese, poultry, vegetables, fish, salads, bread Fennel Seed-Bread, cookies, apples, pork, eggs, fish, beets, cabbage, cheese, Licorice-like flavor Garlic-(buds or powder) Salads, meat, poultry, fish, bread, butter, vegetables, potatoes.Do not  use garlic salt Ginger-Fruit, vegetables, baked goods, meat, fish, poultry Horseradish Root-Meet, vegetables, butter Lemon Juice or Extract-Vegetables, fruit, tea, baked goods, fish salads Mace-Baked goods fruit, vegetables, fish, poultry (taste like nutmeg) Maple Extract-Syrups Marjoram-Meat, chicken, fish, vegetables, breads, green salads (taste like Sage) Mint-Tea, lamb, sherbet, vegetables, desserts, carrots, cabbage Mustard, Dry or Seed-Cheese, eggs, meats, vegetables, poultry Nutmeg-Baked goods, fruit, chicken, eggs, vegetables, desserts Onion Powder-Meat, fish, poultry, vegetables, cheese, eggs, bread, rice salads (Do not use   Onion  salt) Orange Extract-Desserts, baked goods Oregano-Pasta, eggs, cheese, onions, pork, lamb, fish, chicken, vegetables, green salads Paprika-Meat, fish, poultry, eggs, cheese, vegetables Parsley Flakes-Butter, vegetables, meat fish, poultry, eggs, bread, salads (certain forms may   Contain sodium Pepper-Meat fish, poultry, vegetables, eggs Peppermint Extract-Desserts, baked goods Poppy Seed-Eggs, bread, cheese, fruit dressings, baked goods, noodles, vegetables, cottage  [employer], poultry, meat, fish, cauliflower, turnips,eggs bread Saffron-Rice, bread, veal, chicken, fish, eggs Sage-Meat, fish, poultry, onions, eggplant, tomateos, pork, stews Savory-Eggs, salads, poultry, meat, rice, vegetables, soups, pork Tarragon-Meat, poultry, fish, eggs, butter, vegetables (licorice-like flavor)  Thyme-Meat, poultry, fish, eggs, vegetables, (clover-like flavor), sauces, soups Tumeric-Salads, butter, eggs, fish, rice, vegetables (saffron-like flavor) Vanilla Extract-Baked goods, candy Vinegar-Salads, vegetables, meat marinades Walnut Extract-baked goods, candy  2. Choose your Foods Wisely   The following is a list of foods to avoid which are high in sodium:  Meats-Avoid all smoked, canned, salt cured, dried and kosher meat and fish as well as Anchovies   [employer] meats:Bologna, Liverwurst, Pastrami Canned meat or fish  Marinated herring Caviar    Pepperoni Corned Beef   Pizza Dried chipped beef  Salami Frozen breaded fish or meat Salt pork Frankfurters or hot dogs  Sardines Gefilte fish   Sausage Ham (boiled ham, Proscuitto Smoked butt    spiced ham)   Spam      TV Dinners Vegetables Canned vegetables (Regular) Relish Canned mushrooms  Sauerkraut Olives    Tomato juice Pickles  Bakery and Dessert Products Canned puddings  Cream pies Cheesecake   Decorated cakes Cookies  Beverages/Juices Tomato juice, regular  Gatorade   V-8  vegetable juice, regular  Breads and Cereals Biscuit mixes   Salted potato chips, corn chips, pretzels Bread stuffing mixes  Salted crackers and rolls Pancake and waffle mixes Self-rising flour  Seasonings Accent    Meat sauces Barbecue sauce  Meat tenderizer Catsup    Monosodium glutamate (MSG) Celery salt   Onion salt Chili sauce   Prepared mustard Garlic salt   Salt, seasoned salt, sea salt Gravy mixes   Soy sauce Horseradish   Steak sauce Ketchup   Tartar sauce Lite salt    Teriyaki sauce Marinade mixes   Worcestershire sauce  Others Baking powder   Cocoa and cocoa mixes Baking soda   Commercial casserole mixes Candy-caramels, chocolate  Dehydrated soups    Bars, fudge,nougats  Instant rice and pasta mixes Canned broth or soup  Maraschino cherries Cheese, aged and processed cheese and cheese spreads  Learning Assessment Quiz  Indicated T (for True) or F (for False) for each of the following statements:  1. _____ Fresh fruits and vegetables and unprocessed grains are generally low in sodium 2. _____ Water may contain a considerable amount of sodium, depending on the source 3. _____ You can always tell if a food is high in sodium by tasting it 4. _____ Certain laxatives my be high in sodium and should be avoided unless prescribed   by a physician or pharmacist 5. _____ Salt substitutes may be used freely by anyone on a sodium restricted diet 6. _____ Sodium is present in table salt, food additives and as a natural component of   most foods 7. _____ Table salt is approximately 90% sodium 8. _____ Limiting sodium intake may help prevent excess fluid accumulation in the body 9. _____ On a sodium-restricted diet, seasonings such as bouillon soy sauce, and    cooking wine should be used in place of table salt 10. _____ On an ingredient list, a product which lists monosodium glutamate as the first   ingredient is an appropriate food to include on a low sodium diet  Circle the  best answer(s) to the following statements (Hint: there may be more than one correct answer)  11. On a low-sodium diet, some acceptable snack items are:    A. Olives  F. Bean dip   K. Grapefruit juice    B. Salted Pretzels G. Commercial Popcorn   L. Canned peaches    C. Carrot Sticks  H. Bouillon   M. Unsalted nuts   D. [place] fries  I. Peanut butter crackers N. Salami   E. Sweet pickles J. Tomato Juice   O. Pizza  12.  Seasonings that may be used freely on a reduced - sodium diet include   A. Lemon wedges F.Monosodium glutamate K. Celery seed    B.Soysauce   G. Pepper   L. Mustard powder   C. Sea salt  H. Cooking wine  M. Onion flakes   D. Vinegar  E. Prepared horseradish N. Salsa   E. Sage   J. Worcestershire sauce  O. Chutney

## 2020-11-11 ENCOUNTER — Other Ambulatory Visit: Payer: Self-pay

## 2020-11-11 ENCOUNTER — Encounter (HOSPITAL_COMMUNITY): Payer: Self-pay | Admitting: Emergency Medicine

## 2020-11-11 ENCOUNTER — Emergency Department (HOSPITAL_COMMUNITY)
Admission: EM | Admit: 2020-11-11 | Discharge: 2020-11-11 | Disposition: A | Discharge status: Home / Self Care | Payer: Medicare Other | Attending: Emergency Medicine | Admitting: Emergency Medicine

## 2020-11-11 DIAGNOSIS — Z5321 Procedure and treatment not carried out due to patient leaving prior to being seen by health care provider: Secondary | ICD-10-CM | POA: Insufficient documentation

## 2020-11-11 DIAGNOSIS — R109 Unspecified abdominal pain: Secondary | ICD-10-CM | POA: Insufficient documentation

## 2020-11-11 NOTE — ED Triage Notes (Signed)
Pt c/o abdominal pain that started this AM.

## 2020-11-28 DIAGNOSIS — L84 Corns and callosities: Secondary | ICD-10-CM | POA: Diagnosis not present

## 2020-11-28 DIAGNOSIS — I739 Peripheral vascular disease, unspecified: Secondary | ICD-10-CM | POA: Diagnosis not present

## 2020-11-28 DIAGNOSIS — B351 Tinea unguium: Secondary | ICD-10-CM | POA: Diagnosis not present

## 2020-12-01 ENCOUNTER — Other Ambulatory Visit: Payer: Self-pay | Admitting: Family Medicine

## 2021-02-13 DIAGNOSIS — L84 Corns and callosities: Secondary | ICD-10-CM | POA: Diagnosis not present

## 2021-02-13 DIAGNOSIS — I739 Peripheral vascular disease, unspecified: Secondary | ICD-10-CM | POA: Diagnosis not present

## 2021-02-13 DIAGNOSIS — B351 Tinea unguium: Secondary | ICD-10-CM | POA: Diagnosis not present

## 2021-02-17 ENCOUNTER — Ambulatory Visit: Payer: Medicare Other | Admitting: Family Medicine

## 2021-02-25 ENCOUNTER — Other Ambulatory Visit: Payer: Self-pay | Admitting: Family Medicine

## 2021-02-26 ENCOUNTER — Other Ambulatory Visit: Payer: Self-pay | Admitting: Family Medicine

## 2021-03-09 ENCOUNTER — Ambulatory Visit (HOSPITAL_COMMUNITY)
Admission: RE | Admit: 2021-03-09 | Discharge: 2021-03-09 | Disposition: A | Discharge status: Home / Self Care | Payer: Medicare Other | Source: Ambulatory Visit | Attending: Physician Assistant | Admitting: Physician Assistant

## 2021-03-09 ENCOUNTER — Other Ambulatory Visit: Payer: Self-pay

## 2021-03-09 DIAGNOSIS — I350 Nonrheumatic aortic (valve) stenosis: Secondary | ICD-10-CM | POA: Insufficient documentation

## 2021-03-09 LAB — ECHOCARDIOGRAM COMPLETE
AR max vel: 0.97 cm2
AV Area VTI: 0.91 cm2
AV Area mean vel: 0.95 cm2
AV Mean grad: 34 mmHg
AV Peak grad: 59.6 mmHg
Ao pk vel: 3.86 m/s
Area-P 1/2: 2.19 cm2
P 1/2 time: 561 msec
S' Lateral: 2.6 cm

## 2021-03-09 NOTE — Progress Notes (Signed)
*  PRELIMINARY RESULTS* Echocardiogram 2D Echocardiogram has been performed.  Samuel Germany 03/09/2021, 2:00 PM

## 2021-03-12 ENCOUNTER — Ambulatory Visit (INDEPENDENT_AMBULATORY_CARE_PROVIDER_SITE_OTHER): Payer: Medicare Other | Admitting: Cardiology

## 2021-03-12 ENCOUNTER — Other Ambulatory Visit: Payer: Self-pay

## 2021-03-12 ENCOUNTER — Encounter: Payer: Self-pay | Admitting: Cardiology

## 2021-03-12 VITALS — BP 144/70 | HR 73 | Ht 70.0 in | Wt 244.8 lb

## 2021-03-12 DIAGNOSIS — I10 Essential (primary) hypertension: Secondary | ICD-10-CM

## 2021-03-12 DIAGNOSIS — I25119 Atherosclerotic heart disease of native coronary artery with unspecified angina pectoris: Secondary | ICD-10-CM

## 2021-03-12 DIAGNOSIS — I25810 Atherosclerosis of coronary artery bypass graft(s) without angina pectoris: Secondary | ICD-10-CM | POA: Diagnosis not present

## 2021-03-12 DIAGNOSIS — I350 Nonrheumatic aortic (valve) stenosis: Secondary | ICD-10-CM | POA: Diagnosis not present

## 2021-03-12 NOTE — Progress Notes (Signed)
Cardiology Office Note  Date: 03/12/2021   ID: Arsenio, Schnorr October 01, 1940, MRN [id number]  PCP:  Fayrene Helper, MD  Cardiologist:  Rozann Lesches, MD Electrophysiologist:  None   Chief Complaint  Patient presents with   Cardiac follow-up    History of Present Illness: Craig Fields is an 80 y.o. male last assessed via telehealth encounter in February by Ms. Bonnell Public PA-C.  He is here today with family member for a follow-up visit.  We discussed the results of his recent echocardiogram.  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  No recent falls.  With his current baseline activity, he does not describe any angina and has had NYHA class II dyspnea, although a relative lack of stamina over time.  Follow-up echocardiogram done recently showed LVEF 60 to 65% with mild diastolic dysfunction, normal RV contraction, and progressive aortic stenosis in the severe range with mean gradient 34 mmHg and dimensionless index 0.26.  I reviewed his current medications which are listed below.  He is due for follow-up with Dr. Moshe Cipro.  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    Essential hypertension    Hematuria 04/30/2019   New complaint, does nott have pain of kidney stones, needs to submit UA for testing asap and needs urology eval   Hyperlipidemia    IGT (impaired glucose tolerance)    Obesity    OSA (obstructive sleep apnea)    Seizures ([place])    Age 23 or 3, no meds and no reoccurances   Skin lesion 12/07/2012   Skin tag 11/16/2011   Stroke [facility])     Past Surgical History:  Procedure Laterality Date    COLONOSCOPY N/A 07/15/2015   Procedure: COLONOSCOPY;  Surgeon: Aviva Signs, MD;  Location: [facility] ENDO SUITE;  Service: Gastroenterology;  Laterality: N/A;   CORONARY ARTERY BYPASS GRAFT  2004   x4   INCISIONAL HERNIA REPAIR N/A 01/01/2013   Procedure: HERNIA REPAIR INCISIONAL;  Surgeon: Jamesetta So, MD;  Location: [facility] ORS;  Service: General;  Laterality: N/A;  umbilical area   INSERTION OF MESH N/A 01/01/2013   Procedure: INSERTION OF MESH;  Surgeon: Jamesetta So, MD;  Location: [facility] ORS;  Service: General;  Laterality: N/A;   Left inguinal  hernia repair  2005   [name]  2007    Current Outpatient Medications  Medication Sig Dispense Refill   acetaminophen (TYLENOL) 650 MG CR tablet Take by mouth.      aspirin 81 MG EC tablet Take 81 mg by mouth every evening.     Cholecalciferol (VITAMIN D) 2000 units CAPS Take 1 capsule by mouth daily.     clopidogrel (PLAVIX) 75 MG tablet TAKE 1 TABLET BY MOUTH EVERY DAY 90 tablet 1   Coenzyme Q10 (CO Q-10) 200 MG CAPS Take 1 tablet by mouth daily.      KLOR-CON M20 20 MEQ tablet TAKE 2 TABLETS BY MOUTH DAILY 180 tablet 1   lisinopril (ZESTRIL) 10 MG tablet TAKE 1 TABLET BY MOUTH EVERY DAY 90 tablet 1   metoprolol tartrate (LOPRESSOR) 50  MG tablet TAKE 1/2 TABLET BY MOUTH 2 (TWO) TIMES DAILY. 90 tablet 1   Multiple Vitamins-Minerals ([employer] SPECTRAVITE) TABS Take 1 tablet by mouth every evening.     Omega-3 Fatty Acids (FISH OIL) 1200 MG CAPS Take 2 capsules by mouth at bedtime. 1200 mg     polyethylene glycol powder (GLYCOLAX/MIRALAX) powder MIX TO TAKE 17 G DAILY AS DIRECTED (Patient taking differently: Take 17 g by mouth every morning.) 527 g 1   simvastatin (ZOCOR) 20 MG tablet TAKE 1 TABLET BY MOUTH EVERYDAY AT BEDTIME 90 tablet 3   sodium chloride (OCEAN) 0.65 % SOLN nasal spray Place 1 spray into both nostrils as needed for congestion.     triamterene-hydrochlorothiazide (MAXZIDE-25) 37.5-25 MG tablet TAKE 1 TABLET BY MOUTH EVERY DAY IN THE  MORNING 90 tablet 1   UNABLE TO FIND Commode assist chair 1 each 0   UNABLE TO FIND Bilateral new shoes  Left AFO with calliber plate  Dx O67.672 1 each PRN   UNABLE TO FIND Right custom torch walker x 1  Dx C94.709 1 each 0   No current facility-administered medications for this visit.   Allergies:  Bee venom and Niacin   ROS: No palpitations or syncope.  Physical Exam: VS:  BP (!) 144/70   Pulse 73   Ht 5\' 10"  (1.778 m)   Wt 244 lb 12.8 oz (111 kg)   SpO2 92%   BMI 35.13 kg/m , BMI Body mass index is 35.13 kg/m.  Wt Readings from Last 3 Encounters:  03/12/21 244 lb 12.8 oz (111 kg)  11/11/20 238 lb 1.6 oz (108 kg)  11/10/20 238 lb (108 kg)    General: Patient appears comfortable at rest.  Seated in a wheelchair. HEENT: Conjunctiva and lids normal, wearing a mask. Neck: Supple, no elevated JVP or carotid bruits, no thyromegaly. Lungs: Clear to auscultation, nonlabored breathing at rest. Cardiac: Regular rate and rhythm, no S3, 3/6 systolic murmur, no pericardial rub. Extremities: Bilateral orthotic boots in place.  ECG:  An ECG dated 07/07/2018 was personally reviewed today and demonstrated:  Sinus bradycardia with LVH and nonspecific T wave changes.  Recent Labwork: 10/14/2020: ALT 17; AST 25; BUN 24; Creatinine, Ser 1.12; Hemoglobin 14.5; Platelets 173; Potassium 4.3; Sodium 139; TSH 3.440     Component Value Date/Time   CHOL 159 10/14/2020 1001   TRIG 158 (H) 10/14/2020 1001   HDL 48 10/14/2020 1001   CHOLHDL 3.3 10/14/2020 1001   CHOLHDL 3.5 01/01/2020 1620   VLDL 38 (H) 04/19/2017 0914   LDLCALC 84 10/14/2020 1001   LDLCALC 78 01/01/2020 1620    Other Studies Reviewed Today:  Echocardiogram 03/09/2021:  1. Left ventricular ejection fraction, by estimation, is 60 to 65%. The  left ventricle has normal function. The left ventricle has no regional  wall motion abnormalities. There is moderate left ventricular hypertrophy.  Left ventricular diastolic   parameters are consistent with Grade I diastolic dysfunction (impaired  relaxation).   2. Right ventricular systolic function is normal. The right ventricular  size is normal. There is normal pulmonary artery systolic pressure. The  estimated right ventricular systolic pressure is 62.8 mmHg.   3. The mitral valve is abnormal. Mild mitral valve regurgitation.  Moderate mitral annular calcification.   4. The aortic valve is tricuspid. There is moderate calcification of the  aortic valve. Aortic valve regurgitation is mild. Severe aortic valve  stenosis. Aortic regurgitation PHT measures 561 msec. Aortic valve mean  gradient measures 34.0  mmHg.  Dimentionless index 0.26.   5. Aortic dilatation noted. There is mild dilatation of the aortic root,  measuring 40 mm.   6. The inferior vena cava is normal in size with greater than 50%  respiratory variability, suggesting right atrial pressure of 3 mmHg.   Assessment and Plan:  1.  Progressive calcific aortic stenosis, most recently severe range based on echocardiogram in June showing mean gradient of 34 mmHg and dimensionless index 0.26.  We did discuss possible referral for TAVR consultation, although at present level of activity and with current functional limitations he does not report progressive symptoms and we will continue with observation for now.  Plan to follow-up echocardiogram in the next 6 months, sooner if the situation changes.  2.  Multivessel CAD status post CABG in 2004.  He does not describe any angina.  Continue Plavix, Lopressor, lisinopril, and Zocor.  3.  Essential hypertension, systolic in the 076J.  Continue with present medications and follow-up with Dr. Moshe Cipro.  Medication Adjustments/Labs and Tests Ordered: Current medicines are reviewed at length with the patient today.  Concerns regarding medicines are outlined above.   Tests Ordered: Orders Placed This Encounter  Procedures   EKG 12-Lead   ECHOCARDIOGRAM  COMPLETE     Medication Changes: No orders of the defined types were placed in this encounter.   Disposition:  Follow up  6 months.  Signed, Satira Sark, MD, [facility] 03/12/2021 3:02 PM    [facility] [address] [place], [place] [address] Phone: [phone]; Fax: [phone]

## 2021-03-12 NOTE — Patient Instructions (Addendum)
Medication Instructions:  Your physician recommends that you continue on your current medications as directed. Please refer to the Current Medication list given to you today.   *If you need a refill on your cardiac medications before your next appointment, please call your pharmacy*   Lab Work: None today  If you have labs (blood work) drawn today and your tests are completely normal, you will receive your results only by: [facility] (if you have MyChart) OR A paper copy in the mail If you have any lab test that is abnormal or we need to change your treatment, we will call you to review the results.   Testing/Procedures: Your physician has requested that you have an echocardiogram in 6 months (December)  . Echocardiography is a painless test that uses sound waves to create images of your heart. It provides your doctor with information about the size and shape of your heart and how well your heart's chambers and valves are working. This procedure takes approximately one hour. There are no restrictions for this procedure.    Follow-Up: At [facility], you and your health needs are our priority.  As part of our continuing mission to provide you with exceptional heart care, we have created designated Provider Care Teams.  These Care Teams include your primary Cardiologist (physician) and Advanced Practice Providers (APPs -  Physician Assistants and Nurse Practitioners) who all work together to provide you with the care you need, when you need it.  We recommend signing up for the patient portal called "MyChart".  Sign up information is provided on this After Visit Summary.  MyChart is used to connect with patients for Virtual Visits (Telemedicine).  Patients are able to view lab/test results, encounter notes, upcoming appointments, etc.  Non-urgent messages can be sent to your provider as well.   To learn more about what you can do with MyChart, go to [email or web address].    Your  next appointment:   6 month(s)  The format for your next appointment:   In Person  Provider:   Rozann Lesches, MD   Other Instructions None

## 2021-03-17 ENCOUNTER — Telehealth: Payer: Self-pay

## 2021-03-17 NOTE — Telephone Encounter (Signed)
Pt tested positive for covid - FYI

## 2021-03-18 ENCOUNTER — Ambulatory Visit: Payer: Medicare Other | Admitting: Family Medicine

## 2021-03-21 DIAGNOSIS — R0902 Hypoxemia: Secondary | ICD-10-CM | POA: Diagnosis not present

## 2021-03-21 DIAGNOSIS — W19XXXA Unspecified fall, initial encounter: Secondary | ICD-10-CM | POA: Diagnosis not present

## 2021-03-23 ENCOUNTER — Other Ambulatory Visit: Payer: Self-pay

## 2021-03-23 DIAGNOSIS — R7303 Prediabetes: Secondary | ICD-10-CM

## 2021-03-23 DIAGNOSIS — I10 Essential (primary) hypertension: Secondary | ICD-10-CM

## 2021-03-23 DIAGNOSIS — E785 Hyperlipidemia, unspecified: Secondary | ICD-10-CM

## 2021-03-23 NOTE — Telephone Encounter (Signed)
Pt states he is doing well, only sx he has is fatigue. I have him scheduled for early September for follow up and labs are ordered.

## 2021-04-24 DIAGNOSIS — L84 Corns and callosities: Secondary | ICD-10-CM | POA: Diagnosis not present

## 2021-04-24 DIAGNOSIS — B351 Tinea unguium: Secondary | ICD-10-CM | POA: Diagnosis not present

## 2021-04-24 DIAGNOSIS — I739 Peripheral vascular disease, unspecified: Secondary | ICD-10-CM | POA: Diagnosis not present

## 2021-05-10 ENCOUNTER — Other Ambulatory Visit: Payer: Self-pay | Admitting: Family Medicine

## 2021-05-25 DIAGNOSIS — R7303 Prediabetes: Secondary | ICD-10-CM | POA: Diagnosis not present

## 2021-05-25 DIAGNOSIS — I10 Essential (primary) hypertension: Secondary | ICD-10-CM | POA: Diagnosis not present

## 2021-05-25 DIAGNOSIS — E785 Hyperlipidemia, unspecified: Secondary | ICD-10-CM | POA: Diagnosis not present

## 2021-05-26 ENCOUNTER — Other Ambulatory Visit: Payer: Self-pay

## 2021-05-26 ENCOUNTER — Encounter: Payer: Self-pay | Admitting: Family Medicine

## 2021-05-26 ENCOUNTER — Ambulatory Visit (INDEPENDENT_AMBULATORY_CARE_PROVIDER_SITE_OTHER): Payer: Medicare Other | Admitting: Family Medicine

## 2021-05-26 VITALS — BP 129/60 | HR 64 | Temp 98.1°F | Resp 18 | Ht 70.0 in | Wt 236.0 lb

## 2021-05-26 DIAGNOSIS — I38 Endocarditis, valve unspecified: Secondary | ICD-10-CM | POA: Diagnosis not present

## 2021-05-26 DIAGNOSIS — I25810 Atherosclerosis of coronary artery bypass graft(s) without angina pectoris: Secondary | ICD-10-CM

## 2021-05-26 DIAGNOSIS — Z23 Encounter for immunization: Secondary | ICD-10-CM | POA: Diagnosis not present

## 2021-05-26 DIAGNOSIS — Z85038 Personal history of other malignant neoplasm of large intestine: Secondary | ICD-10-CM | POA: Diagnosis not present

## 2021-05-26 DIAGNOSIS — I2510 Atherosclerotic heart disease of native coronary artery without angina pectoris: Secondary | ICD-10-CM | POA: Diagnosis not present

## 2021-05-26 DIAGNOSIS — R262 Difficulty in walking, not elsewhere classified: Secondary | ICD-10-CM | POA: Diagnosis not present

## 2021-05-26 DIAGNOSIS — R7303 Prediabetes: Secondary | ICD-10-CM

## 2021-05-26 DIAGNOSIS — I69954 Hemiplegia and hemiparesis following unspecified cerebrovascular disease affecting left non-dominant side: Secondary | ICD-10-CM | POA: Diagnosis not present

## 2021-05-26 DIAGNOSIS — I10 Essential (primary) hypertension: Secondary | ICD-10-CM

## 2021-05-26 DIAGNOSIS — E785 Hyperlipidemia, unspecified: Secondary | ICD-10-CM | POA: Diagnosis not present

## 2021-05-26 DIAGNOSIS — Z20822 Contact with and (suspected) exposure to covid-19: Secondary | ICD-10-CM | POA: Diagnosis not present

## 2021-05-26 LAB — LIPID PANEL
Chol/HDL Ratio: 2.9 ratio (ref 0.0–5.0)
Cholesterol, Total: 132 mg/dL (ref 100–199)
HDL: 45 mg/dL (ref >39)
LDL Chol Calc (NIH): 71 mg/dL (ref 0–99)
Triglycerides: 85 mg/dL (ref 0–149)
VLDL Cholesterol Cal: 16 mg/dL (ref 5–40)

## 2021-05-26 LAB — CMP14+EGFR
ALT: 12 IU/L (ref 0–44)
AST: 18 IU/L (ref 0–40)
Albumin/Globulin Ratio: 1.7 (ref 1.2–2.2)
Albumin: 4.1 g/dL (ref 3.6–4.6)
Alkaline Phosphatase: 39 IU/L — ABNORMAL LOW (ref 44–121)
BUN/Creatinine Ratio: 20 (ref 10–24)
BUN: 17 mg/dL (ref 8–27)
Bilirubin Total: 0.5 mg/dL (ref 0.0–1.2)
CO2: 23 mmol/L (ref 20–29)
Calcium: 9.5 mg/dL (ref 8.6–10.2)
Chloride: 102 mmol/L (ref 96–106)
Creatinine, Ser: 0.87 mg/dL (ref 0.76–1.27)
Globulin, Total: 2.4 g/dL (ref 1.5–4.5)
Glucose: 89 mg/dL (ref 65–99)
Potassium: 4 mmol/L (ref 3.5–5.2)
Sodium: 141 mmol/L (ref 134–144)
Total Protein: 6.5 g/dL (ref 6.0–8.5)
eGFR: 86 mL/min/{1.73_m2} (ref >59)

## 2021-05-26 NOTE — Patient Instructions (Signed)
Follow-up in office in 6 months call if you need me sooner.  Call for lab order 1 week before next visit  Flu vaccine in office today.  Please get the covid booster  Labs are excellent no changes in medication.  You are referred to Dr. Arnoldo Morale for consideration for repeat colonoscopy.  As we discussed benefit versus risk will be addressed at the visit please discuss this with your family also.  Continue to be careful not to fall and practice home good home safety.  Congrats on weight loss through change in diet continue this as this will improve your overall health.  Thanks for choosing [facility], we consider it a privelige to serve you.

## 2021-05-27 DIAGNOSIS — I38 Endocarditis, valve unspecified: Secondary | ICD-10-CM | POA: Insufficient documentation

## 2021-05-27 DIAGNOSIS — Z85038 Personal history of other malignant neoplasm of large intestine: Secondary | ICD-10-CM | POA: Insufficient documentation

## 2021-05-27 NOTE — Assessment & Plan Note (Signed)
Unchanged,stable

## 2021-05-27 NOTE — Addendum Note (Signed)
Addended by: Tula Nakayama E on: 05/27/2021 05:11 AM   Modules accepted: Level of Service

## 2021-05-27 NOTE — Assessment & Plan Note (Signed)
Patient educated about the importance of limiting  Carbohydrate intake , the need to commit to daily physical activity for a minimum of 30 minutes , and to commit weight loss. The fact that changes in all these areas will reduce or eliminate all together the development of diabetes is stressed. Updated lab needed at/ before next visit.   Diabetic Labs Latest Ref Rng & Units 05/25/2021 10/14/2020 01/01/2020 05/15/2019 10/17/2018  HbA1c <5.7 % of total Hgb - - 5.8(H) 5.7(H) 6.0(H)  Chol 100 - 199 mg/dL 132 159 141 149 145  HDL >39 mg/dL 45 48 40 42 42  Calc LDL 0 - 99 mg/dL 71 84 78 86 77  Triglycerides 0 - 149 mg/dL 85 158(H) 135 116 159(H)  Creatinine 0.76 - 1.27 mg/dL 0.87 1.12 0.92 1.06 1.01   BP/Weight 05/26/2021 03/12/2021 11/11/2020 11/10/2020 09/17/2020 06/20/2020 [id number]  Systolic BP Q000111Q 123456 Q000111Q [id number] -  Diastolic BP 60 70 69 81 72 78 -  Wt. (Lbs) 236 244.8 238.1 238 240 238 232  BMI 33.86 35.13 34.16 34.15 35.44 35.15 33.29   Foot/eye exam completion dates 01/08/2020  Foot Form Completion Done

## 2021-05-27 NOTE — Assessment & Plan Note (Signed)
Hyperlipidemia:Low fat diet discussed and encouraged.   Lipid Panel  Lab Results  Component Value Date   CHOL 132 05/25/2021   HDL 45 05/25/2021   LDLCALC 71 05/25/2021   TRIG 85 05/25/2021   CHOLHDL 2.9 05/25/2021   Controlled, no change in medication

## 2021-05-27 NOTE — Assessment & Plan Note (Signed)
Home safety and fall risk reduction discussed, no falls reported in past 9 months

## 2021-05-27 NOTE — Assessment & Plan Note (Signed)
Controlled, no change in medication  

## 2021-05-27 NOTE — Assessment & Plan Note (Signed)
Denies recent chest pain, stable

## 2021-05-27 NOTE — Assessment & Plan Note (Signed)
Multiple co morbidities, with worsening aortic stenosis, rept colonoscopy recommended is past due, this needs to be informed decision making between surgeon and patient and his family, I started the discussion at visit and will refer  For final decision to be taken as to whether to proceed with colonoscopy

## 2021-05-27 NOTE — Assessment & Plan Note (Signed)
Significant progression of aortic valve stenosis noted in 0/2022, cardiology following closely

## 2021-05-27 NOTE — Progress Notes (Signed)
Craig Fields     MRN: [id number]      DOB: 10-May-1941   HPI Craig Fields is here for follow up and re-evaluation of chronic medical conditions, medication management and review of any available recent lab and radiology data.  Preventive health is updated, specifically  Cancer screening and Immunization.   Questions or concerns regarding consultations or procedures which the PT has had in the interim are  addressed. The PT denies any adverse reactions to current medications since the last visit.  There are no new concerns.  There are no specific complaints   ROS Denies recent fever or chills. Denies sinus pressure, nasal congestion, ear pain or sore throat. Denies chest congestion, productive cough or wheezing. Denies chest pains, palpitations and leg swelling Denies abdominal pain, nausea, vomiting,diarrhea or constipation.   Denies dysuria, frequency, hesitancy or incontinence. Chronic  limitation in mobility. Denies headaches, seizures, numbness, or tingling. Denies depression, anxiety or insomnia. Denies skin break down or rash.   PE  BP 129/60   Pulse 64   Temp 98.1 F (36.7 C)   Resp 18   Ht '5\' 10"'$  (1.778 m)   Wt 236 lb (107 kg)   SpO2 94%   BMI 33.86 kg/m   Patient alert and oriented and in no cardiopulmonary distress.  HEENT: No facial asymmetry, EOMI,     Neck decreased though adequate ROM  Chest: Clear to auscultation bilaterally.  CVS: S1, S2 systolic murmurs, no S3.Regular rate.  ABD: Soft non tender.   Ext: No edema  MS: decreased  ROM spine, shoulders, hips and knees.Contracture of RUE  Skin: Intact, no ulcerations or rash noted.  Psych: Good eye contact, normal affect. Memory intact not anxious or depressed appearing.  CNS: CN 2-12 intact, right hemiparesis   Assessment & Plan  Valvular heart disease Significant progression of aortic valve stenosis noted in 0/2022, cardiology following closely  Prediabetes Patient educated about the  importance of limiting  Carbohydrate intake , the need to commit to daily physical activity for a minimum of 30 minutes , and to commit weight loss. The fact that changes in all these areas will reduce or eliminate all together the development of diabetes is stressed. Updated lab needed at/ before next visit.   Diabetic Labs Latest Ref Rng & Units 05/25/2021 10/14/2020 01/01/2020 05/15/2019 10/17/2018  HbA1c <5.7 % of total Hgb - - 5.8(H) 5.7(H) 6.0(H)  Chol 100 - 199 mg/dL 132 159 141 149 145  HDL >39 mg/dL 45 48 40 42 42  Calc LDL 0 - 99 mg/dL 71 84 78 86 77  Triglycerides 0 - 149 mg/dL 85 158(H) 135 116 159(H)  Creatinine 0.76 - 1.27 mg/dL 0.87 1.12 0.92 1.06 1.01   BP/Weight 05/26/2021 03/12/2021 11/11/2020 11/10/2020 09/17/2020 06/20/2020 [id number]  Systolic BP Q000111Q 123456 Q000111Q [id number] -  Diastolic BP 60 70 69 81 72 78 -  Wt. (Lbs) 236 244.8 238.1 238 240 238 232  BMI 33.86 35.13 34.16 34.15 35.44 35.15 33.29   Foot/eye exam completion dates 01/08/2020  Foot Form Completion Done      Hyperlipidemia LDL goal <70 Hyperlipidemia:Low fat diet discussed and encouraged.   Lipid Panel  Lab Results  Component Value Date   CHOL 132 05/25/2021   HDL 45 05/25/2021   LDLCALC 71 05/25/2021   TRIG 85 05/25/2021   CHOLHDL 2.9 05/25/2021   Controlled, no change in medication     Difficulty walking Home safety and fall risk reduction discussed,  no falls reported in past 9 months  Hemiplegia, late effect of cerebrovascular disease (HCC) Unchanged, stable  Coronary atherosclerosis of native coronary artery Denies recent chest pain, stable  Essential hypertension, benign Controlled, no change in medication   H/O colon cancer, stage I Multiple co morbidities, with worsening aortic stenosis, rept colonoscopy recommended is past due, this needs to be informed decision making between surgeon and patient and his family, I started the discussion at visit and will refer  For final decision to be  taken as to whether to proceed with colonoscopy

## 2021-05-28 ENCOUNTER — Telehealth: Payer: Self-pay | Admitting: Family Medicine

## 2021-05-28 NOTE — Telephone Encounter (Signed)
Pt is calling wants to speak with Velna Hatchet

## 2021-05-29 NOTE — Telephone Encounter (Signed)
States he told [facility] that he would get his daughter to call them back to set up his appt after she gets back from the beach

## 2021-06-04 ENCOUNTER — Telehealth: Payer: Self-pay

## 2021-06-04 NOTE — Telephone Encounter (Signed)
Patient called said he has decided not to go have his coloscopy done now. Call back # [phone]

## 2021-06-23 ENCOUNTER — Encounter: Payer: Medicare Other | Admitting: Family Medicine

## 2021-06-24 ENCOUNTER — Other Ambulatory Visit: Payer: Self-pay

## 2021-06-24 ENCOUNTER — Ambulatory Visit (INDEPENDENT_AMBULATORY_CARE_PROVIDER_SITE_OTHER): Payer: Medicare Other

## 2021-06-24 VITALS — Ht 70.0 in | Wt 236.0 lb

## 2021-06-24 DIAGNOSIS — Z0000 Encounter for general adult medical examination without abnormal findings: Secondary | ICD-10-CM | POA: Diagnosis not present

## 2021-06-24 NOTE — Patient Instructions (Signed)
Craig Fields , Thank you for taking time to come for your Medicare Wellness Visit. I appreciate your ongoing commitment to your health goals. Please review the following plan we discussed and let me know if I can assist you in the future.   Screening recommendations/referrals: Colonoscopy: Done 07/15/2015 Repeat in 5 years  Recommended yearly ophthalmology/optometry visit for glaucoma screening and checkup Recommended yearly dental visit for hygiene and checkup  Vaccinations: Influenza vaccine: Done 05/26/2021 Repeat annually  Pneumococcal vaccine: Done 01/04/2007 and 04/10/2014 Tdap vaccine: Done 08/15/2014 Repeat in 10 years  Shingles vaccine: Zoster done 01/10/2007 Shingrix discussed. Please contact your pharmacy for coverage information.     Covid-19: Done 10/03/2019, 10/24/2019, 07/12/2020  Advanced directives: Advance directive discussed with you today. Even though you declined this today, please call our office should you change your mind, and we can give you the proper paperwork for you to fill out.   Conditions/risks identified: Aim for 30 minutes of exercise or walking each day, drink 6-8 glasses of water and eat lots of fruits and vegetables.   Next appointment: Follow up in one year for your annual wellness visit. 2023  Preventive Care 82 Years and Older, Male  Preventive care refers to lifestyle choices and visits with your health care provider that can promote health and wellness. What does preventive care include? A yearly physical exam. This is also called an annual well check. Dental exams once or twice a year. Routine eye exams. Ask your health care provider how often you should have your eyes checked. Personal lifestyle choices, including: Daily care of your teeth and gums. Regular physical activity. Eating a healthy diet. Avoiding tobacco and drug use. Limiting alcohol use. Practicing safe sex. Taking low doses of aspirin every day. Taking vitamin and mineral  supplements as recommended by your health care provider. What happens during an annual well check? The services and screenings done by your health care provider during your annual well check will depend on your age, overall health, lifestyle risk factors, and family history of disease. Counseling  Your health care provider may ask you questions about your: Alcohol use. Tobacco use. Drug use. Emotional well-being. Home and relationship well-being. Sexual activity. Eating habits. History of falls. Memory and ability to understand (cognition). Work and work Statistician. Screening  You may have the following tests or measurements: Height, weight, and BMI. Blood pressure. Lipid and cholesterol levels. These may be checked every 5 years, or more frequently if you are over 75 years old. Skin check. Lung cancer screening. You may have this screening every year starting at age 82 if you have a 30-pack-year history of smoking and currently smoke or have quit within the past 15 years. Fecal occult blood test (FOBT) of the stool. You may have this test every year starting at age 82. Flexible sigmoidoscopy or colonoscopy. You may have a sigmoidoscopy every 5 years or a colonoscopy every 10 years starting at age 82. Prostate cancer screening. Recommendations will vary depending on your family history and other risks. Hepatitis C blood test. Hepatitis B blood test. Sexually transmitted disease (STD) testing. Diabetes screening. This is done by checking your blood sugar (glucose) after you have not eaten for a while (fasting). You may have this done every 1-3 years. Abdominal aortic aneurysm (AAA) screening. You may need this if you are a current or former smoker. Osteoporosis. You may be screened starting at age 1 if you are at high risk. Talk with your health care provider about your  test results, treatment options, and if necessary, the need for more tests. Vaccines  Your health care provider  may recommend certain vaccines, such as: Influenza vaccine. This is recommended every year. Tetanus, diphtheria, and acellular pertussis (Tdap, Td) vaccine. You may need a Td booster every 10 years. Zoster vaccine. You may need this after age 40. Pneumococcal 13-valent conjugate (PCV13) vaccine. One dose is recommended after age 65. Pneumococcal polysaccharide (PPSV23) vaccine. One dose is recommended after age 13. Talk to your health care provider about which screenings and vaccines you need and how often you need them. This information is not intended to replace advice given to you by your health care provider. Make sure you discuss any questions you have with your health care provider. Document Released: 09/26/2015 Document Revised: 05/19/2016 Document Reviewed: 07/01/2015 Elsevier Interactive Patient Education  2017 [place] Prevention in the Home Falls can cause injuries. They can happen to people of all ages. There are many things you can do to make your home safe and to help prevent falls. What can I do on the outside of my home? Regularly fix the edges of walkways and driveways and fix any cracks. Remove anything that might make you trip as you walk through a door, such as a raised step or threshold. Trim any bushes or trees on the path to your home. Use bright outdoor lighting. Clear any walking paths of anything that might make someone trip, such as rocks or tools. Regularly check to see if handrails are loose or broken. Make sure that both sides of any steps have handrails. Any raised decks and porches should have guardrails on the edges. Have any leaves, snow, or ice cleared regularly. Use sand or salt on walking paths during winter. Clean up any spills in your garage right away. This includes oil or grease spills. What can I do in the bathroom? Use night lights. Install grab bars by the toilet and in the tub and shower. Do not use towel bars as grab bars. Use  non-skid mats or decals in the tub or shower. If you need to sit down in the shower, use a plastic, non-slip stool. Keep the floor dry. Clean up any water that spills on the floor as soon as it happens. Remove soap buildup in the tub or shower regularly. Attach bath mats securely with double-sided non-slip rug tape. Do not have throw rugs and other things on the floor that can make you trip. What can I do in the bedroom? Use night lights. Make sure that you have a light by your bed that is easy to reach. Do not use any sheets or blankets that are too big for your bed. They should not hang down onto the floor. Have a firm chair that has side arms. You can use this for support while you get dressed. Do not have throw rugs and other things on the floor that can make you trip. What can I do in the kitchen? Clean up any spills right away. Avoid walking on wet floors. Keep items that you use a lot in easy-to-reach places. If you need to reach something above you, use a strong step stool that has a grab bar. Keep electrical cords out of the way. Do not use floor polish or wax that makes floors slippery. If you must use wax, use non-skid floor wax. Do not have throw rugs and other things on the floor that can make you trip. What can I do with my  stairs? Do not leave any items on the stairs. Make sure that there are handrails on both sides of the stairs and use them. Fix handrails that are broken or loose. Make sure that handrails are as long as the stairways. Check any carpeting to make sure that it is firmly attached to the stairs. Fix any carpet that is loose or worn. Avoid having throw rugs at the top or bottom of the stairs. If you do have throw rugs, attach them to the floor with carpet tape. Make sure that you have a light switch at the top of the stairs and the bottom of the stairs. If you do not have them, ask someone to add them for you. What else can I do to help prevent falls? Wear  shoes that: Do not have high heels. Have rubber bottoms. Are comfortable and fit you well. Are closed at the toe. Do not wear sandals. If you use a stepladder: Make sure that it is fully opened. Do not climb a closed stepladder. Make sure that both sides of the stepladder are locked into place. Ask someone to hold it for you, if possible. Clearly mark and make sure that you can see: Any grab bars or handrails. First and last steps. Where the edge of each step is. Use tools that help you move around (mobility aids) if they are needed. These include: Canes. Walkers. Scooters. Crutches. Turn on the lights when you go into a dark area. Replace any light bulbs as soon as they burn out. Set up your furniture so you have a clear path. Avoid moving your furniture around. If any of your floors are uneven, fix them. If there are any pets around you, be aware of where they are. Review your medicines with your doctor. Some medicines can make you feel dizzy. This can increase your chance of falling. Ask your doctor what other things that you can do to help prevent falls. This information is not intended to replace advice given to you by your health care provider. Make sure you discuss any questions you have with your health care provider. Document Released: 06/26/2009 Document Revised: 02/05/2016 Document Reviewed: 10/04/2014 Elsevier Interactive Patient Education  2017 [employer].

## 2021-06-24 NOTE — Progress Notes (Signed)
Subjective:   Craig Fields is a 80 y.o. male who presents for Medicare Annual/Subsequent preventive examination. Virtual Visit via Telephone Note  I connected with  Craig Fields on 06/24/21 at 10:20 AM EDT by telephone and verified that I am speaking with the correct person using two identifiers.  Location: Patient: HOME  Provider: [facility] Persons participating in the virtual visit: patient/Nurse Health Advisor   I discussed the limitations, risks, security and privacy concerns of performing an evaluation and management service by telephone and the availability of in person appointments. The patient expressed understanding and agreed to proceed.  Interactive audio and video telecommunications were attempted between this nurse and patient, however failed, due to patient having technical difficulties OR patient did not have access to video capability.  We continued and completed visit with audio only.  Some vital signs may be absent or patient reported.   Craig Driver, LPN  Review of Systems           Objective:    Today's Vitals   06/24/21 1031 06/24/21 1033  Weight: 236 lb (107 kg)   Height: 5\' 10"  (1.778 m)   PainSc:  0-No pain   Body mass index is 33.86 kg/m.  Advanced Directives 06/24/2021 11/11/2020 06/14/2018 05/11/2018 06/13/2017 11/24/2016 07/15/2015  Does Patient Have a Medical Advance Directive? No No No No No No No  Would patient like information on creating a medical advance directive? No - Patient declined - Yes (ED - Information included in AVS) - Yes (MAU/Ambulatory/Procedural Areas - Information given) No - Patient declined No - patient declined information  Pre-existing out of facility DNR order (yellow form or pink MOST form) - - - - - - -    Current Medications (verified) Outpatient Encounter Medications as of 06/24/2021  Medication Sig   acetaminophen (TYLENOL) 650 MG CR tablet Take by mouth.    aspirin 81 MG EC tablet Take 81 mg by mouth every  evening.   Cholecalciferol (VITAMIN D) 2000 units CAPS Take 1 capsule by mouth daily.   clopidogrel (PLAVIX) 75 MG tablet TAKE 1 TABLET BY MOUTH EVERY DAY   Coenzyme Q10 (CO Q-10) 200 MG CAPS Take 1 tablet by mouth daily.    KLOR-CON M20 20 MEQ tablet TAKE 2 TABLETS BY MOUTH DAILY   lisinopril (ZESTRIL) 10 MG tablet TAKE 1 TABLET BY MOUTH EVERY DAY   metoprolol tartrate (LOPRESSOR) 50 MG tablet TAKE 1/2 TABLET BY MOUTH 2 (TWO) TIMES DAILY.   Multiple Vitamins-Minerals (CVS SPECTRAVITE) TABS Take 1 tablet by mouth every evening.   Omega-3 Fatty Acids (FISH OIL) 1200 MG CAPS Take 2 capsules by mouth at bedtime. 1200 mg   polyethylene glycol powder (GLYCOLAX/MIRALAX) powder MIX TO TAKE 17 G DAILY AS DIRECTED (Patient taking differently: Take 17 g by mouth every morning.)   simvastatin (ZOCOR) 20 MG tablet TAKE 1 TABLET BY MOUTH EVERYDAY AT BEDTIME   sodium chloride (OCEAN) 0.65 % SOLN nasal spray Place 1 spray into both nostrils as needed for congestion.   triamterene-hydrochlorothiazide (MAXZIDE-25) 37.5-25 MG tablet TAKE 1 TABLET BY MOUTH EVERY DAY IN THE MORNING   UNABLE TO FIND Commode assist chair   UNABLE TO FIND Bilateral new shoes  Left AFO with calliber plate  Dx Z61.096   UNABLE TO FIND Right custom torch walker x 1  Dx E45.409   No facility-administered encounter medications on file as of 06/24/2021.    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)    Age 1 or 3, no meds and no reoccurances   Skin lesion 12/07/2012   Skin tag 11/16/2011   Stroke [facility])    Past Surgical History:  Procedure Laterality Date   COLONOSCOPY N/A 07/15/2015   Procedure: COLONOSCOPY;  Surgeon: Aviva Signs, MD;  Location: [facility] ENDO [facility];  Service: Gastroenterology;  Laterality: N/A;   CORONARY ARTERY BYPASS GRAFT  2004   x4   INCISIONAL HERNIA REPAIR N/A 01/01/2013   Procedure: HERNIA REPAIR INCISIONAL;  Surgeon: Jamesetta So, MD;  Location: [facility] ORS;  Service: General;  Laterality: N/A;  umbilical area   INSERTION OF MESH N/A 01/01/2013   Procedure: INSERTION OF MESH;  Surgeon: Jamesetta So, MD;  Location: [facility] ORS;  Service: General;  Laterality: N/A;   Left inguinal  hernia repair  2005   [name]  2007   Family History  Problem Relation Age of Onset   Pancreatic cancer Mother    Heart disease Father    Prostate cancer Father    Stroke Brother    Heart disease Brother    Heart disease Brother    COPD Sister    Actinic keratosis Sister    Obesity Son    Anxiety disorder Son    Social History   Socioeconomic History   Marital status: Married    Spouse name: Jolayne Haines   Number of children: 2   Years of education: Not on file   Highest education level: 12th grade  Occupational History   Occupation: retired   Tobacco Use   Smoking status: Former    Packs/day: 0.50    Years: 5.00    Pack years: 2.50    Types: Cigarettes    Quit date: 12/27/1980    Years since quitting: 40.5   Smokeless tobacco: Former    Types: Chew    Quit date: 01/24/2013  Vaping Use   Vaping Use: Never used  Substance and Sexual Activity   Alcohol use: No    Alcohol/week: 0.0 standard drinks   Drug use: No   Sexual activity: Not Currently    Birth control/protection: None  Other Topics Concern   Not on file  Social History Narrative   Not on file   Social Determinants of Health   Financial Resource Strain: Low Risk    Difficulty of Paying Living  Expenses: Not hard at all  Food Insecurity: No Food Insecurity   Worried About Charity fundraiser in the Last Year: Never true   [place] in the Last Year: Never true  Transportation Needs: No Transportation Needs   Lack of Transportation (Medical): No   Lack of Transportation (Non-Medical): No  Physical Activity: Insufficiently Active   Days of Exercise per Week: 3 days   Minutes of Exercise per Session: 10 min  Stress: No Stress Concern Present  Feeling of Stress : Not at all  Social Connections: Socially Integrated   Frequency of Communication with Friends and Family: More than three times a week   Frequency of Social Gatherings with Friends and Family: More than three times a week   Attends Religious Services: 1 to 4 times per year   Active Member of [employer] or Organizations: Yes   Attends Archivist Meetings: 1 to 4 times per year   Marital Status: Married    Tobacco Counseling Counseling given: Not Answered   Clinical Intake:  Pre-visit preparation completed: Yes  Pain : No/denies pain Pain Score: 0-No pain     BMI - recorded: 33.86 Nutritional Status: BMI > 30  Obese Nutritional Risks: None Diabetes: No  How often do you need to have someone help you when you read instructions, pamphlets, or other written materials from your doctor or pharmacy?: 1 - Never  Diabetic?no-prediabetes, not on medications and does not check blood sugar.   Interpreter Needed?: No  Information entered by :: MJ Colton Tassin, LPN   Activities of Daily Living In your present state of health, do you have any difficulty performing the following activities: 06/24/2021  Hearing? N  Vision? N  Difficulty concentrating or making decisions? Y  Walking or climbing stairs? Y  Dressing or bathing? N  Doing errands, shopping? Y  Preparing Food and eating ? N  Using the Toilet? N  In the past six months, have you accidently leaked urine? N  Do you have problems with loss of bowel  control? N  Managing your Medications? N  Managing your Finances? N  Housekeeping or managing your Housekeeping? N  Some recent data might be hidden    Patient Care Team: Fayrene Helper, MD as PCP - General Domenic Polite Aloha Gell, MD as PCP - Cardiology (Cardiology) Caprice Beaver, DPM as Consulting Physician (Podiatry) Satira Sark, MD as Consulting Physician (Cardiology) Aviva Signs, MD as Consulting Physician (General Surgery)  Indicate any recent Medical Services you may have received from other than [facility] providers in the past year (date may be approximate).     Assessment:   This is a routine wellness examination for Gurinder.  Hearing/Vision screen Hearing Screening - Comments:: No hearing issues.  Vision Screening - Comments:: Glasses. Dr. Gershon Crane. Overdue.   Dietary issues and exercise activities discussed: Current Exercise Habits: The patient does not participate in regular exercise at present   Goals Addressed             This Visit's Progress    Exercise 3x per week (30 min per time)   On track    Recommend starting a routine exercise program at least 3 days a week for 30-45 minutes at a time as tolerated.        Depression Screen PHQ 2/9 Scores 06/24/2021 05/26/2021 06/20/2020 06/20/2020 05/13/2020 06/20/2019 04/26/2019  PHQ - 2 Score 0 0 0 0 0 0 0    Fall Risk Fall Risk  06/24/2021 05/26/2021 06/20/2020 05/13/2020 01/08/2020  Falls in the past year? 1 1 1 1 1   Number falls in past yr: 1 1 1 1  0  Injury with Fall? 0 0 1 1 0  Comment - - - - -  Risk Factor Category  - - - - -  Risk for fall due to : Impaired balance/gait;Impaired mobility;Impaired vision;History of fall(s) Impaired balance/gait;Impaired mobility;Orthopedic patient - - Impaired balance/gait;Impaired mobility;History of fall(s)  Follow up Falls prevention discussed Falls evaluation completed - - -  FALL RISK PREVENTION PERTAINING TO THE HOME:  Any stairs in or around the home? Yes   If so, are there any without handrails? No  Home free of loose throw rugs in walkways, pet beds, electrical cords, etc? Yes  Adequate lighting in your home to reduce risk of falls? Yes   ASSISTIVE DEVICES UTILIZED TO PREVENT FALLS:  Life alert? Yes  Use of a cane, walker or w/c? Yes  Grab bars in the bathroom? Yes  Shower chair or bench in shower? Yes  Elevated toilet seat or a handicapped toilet? Yes   TIMED UP AND GO:  Was the test performed? No . Phone visit.   Cognitive Function:     6CIT Screen 06/24/2021 06/20/2020 06/20/2019 06/14/2018 06/13/2017  What Year? 0 points 0 points 0 points 0 points 0 points  What month? 0 points 0 points 0 points 0 points 0 points  What time? 0 points 3 points 0 points 0 points 0 points  Count back from 20 0 points 0 points 0 points 2 points 0 points  Months in reverse 0 points 0 points 0 points 0 points 0 points  Repeat phrase 2 points 0 points 0 points 0 points 0 points  Total Score 2 3 0 2 0    Immunizations Immunization History  Administered Date(s) Administered   Fluad Quad(high Dose 65+) 05/15/2019, 06/27/2020, 05/26/2021   H1N1 08/27/2008, 10/16/2008   Influenza Split 07/06/2011, 06/07/2012   Influenza Whole 06/14/2007, 06/24/2009, 06/30/2010   Influenza,inj,Quad PF,6+ Mos 06/18/2013, 06/18/2014, 06/02/2015, 06/10/2016, 06/13/2017, 05/16/2018   Moderna Sars-Covid-2 Vaccination 10/03/2019, 10/24/2019, 07/12/2020   Pneumococcal Conjugate-13 04/10/2014   Pneumococcal Polysaccharide-23 01/04/2007   Td 04/15/2004   Tdap 08/15/2014   Zoster, Live 01/10/2007    TDAP status: Up to date  Flu Vaccine status: Up to date  Pneumococcal vaccine status: Up to date  Covid-19 vaccine status: Information provided on how to obtain vaccines.   Qualifies for Shingles Vaccine? Yes   Zostavax completed Yes   Shingrix Completed?: No.    Education has been provided regarding the importance of this vaccine. Patient has been advised to call  insurance company to determine out of pocket expense if they have not yet received this vaccine. Advised may also receive vaccine at local pharmacy or Health Dept. Verbalized acceptance and understanding.  Screening Tests Health Maintenance  Topic Date Due   Zoster Vaccines- Shingrix (1 of 2) Never done   COLONOSCOPY (Pts 45-65yrs Insurance coverage will need to be confirmed)  07/14/2020   COVID-19 Vaccine (4 - Booster) 11/10/2020   TETANUS/TDAP  08/15/2024   INFLUENZA VACCINE  Completed   HPV VACCINES  Aged Out    Health Maintenance  Health Maintenance Due  Topic Date Due   Zoster Vaccines- Shingrix (1 of 2) Never done   COLONOSCOPY (Pts 45-80yrs Insurance coverage will need to be confirmed)  07/14/2020   COVID-19 Vaccine (4 - Booster) 11/10/2020    Colorectal cancer screening: Type of screening: Colonoscopy. Completed 07/15/2015. Repeat every 5 years  Lung Cancer Screening: (Low Dose CT Chest recommended if Age 35-80 years, 30 pack-year currently smoking OR have quit w/in 15years.) does not qualify.  Additional Screening:  Hepatitis C Screening: does not qualify.  Vision Screening: Recommended annual ophthalmology exams for early detection of glaucoma and other disorders of the eye. Is the patient up to date with their annual eye exam?  No  Who is the provider or what is the name of the office in which the patient  attends annual eye exams? Dr. Gershon Crane If pt is not established with a provider, would they like to be referred to a provider to establish care? No .   Dental Screening: Recommended annual dental exams for proper oral hygiene  Community Resource Referral / Chronic Care Management: CRR required this visit?  No   CCM required this visit?  No      Plan:     I have personally reviewed and noted the following in the patient's chart:   Medical and social history Use of alcohol, tobacco or illicit drugs  Current medications and supplements including opioid  prescriptions. Patient is not currently taking opioid prescriptions. Functional ability and status Nutritional status Physical activity Advanced directives List of other physicians Hospitalizations, surgeries, and ER visits in previous 12 months Vitals Screenings to include cognitive, depression, and falls Referrals and appointments  In addition, I have reviewed and discussed with patient certain preventive protocols, quality metrics, and best practice recommendations. A written personalized care plan for preventive services as well as general preventive health recommendations were provided to patient.     Craig Driver, LPN   89/10/2838   Nurse Notes: Pt states he is doing well. Declines repeat colonoscopy at this time. Pt declines eye exam referral. Pt states, "I can see fine."

## 2021-07-03 DIAGNOSIS — B351 Tinea unguium: Secondary | ICD-10-CM | POA: Diagnosis not present

## 2021-07-03 DIAGNOSIS — I739 Peripheral vascular disease, unspecified: Secondary | ICD-10-CM | POA: Diagnosis not present

## 2021-07-03 DIAGNOSIS — L84 Corns and callosities: Secondary | ICD-10-CM | POA: Diagnosis not present

## 2021-08-30 ENCOUNTER — Other Ambulatory Visit: Payer: Self-pay | Admitting: Family Medicine

## 2021-09-09 ENCOUNTER — Other Ambulatory Visit: Payer: Self-pay

## 2021-09-09 ENCOUNTER — Emergency Department (HOSPITAL_COMMUNITY): Payer: Medicare Other

## 2021-09-09 ENCOUNTER — Emergency Department (HOSPITAL_COMMUNITY)
Admission: EM | Admit: 2021-09-09 | Discharge: 2021-09-09 | Disposition: A | Discharge status: Home / Self Care | Payer: Medicare Other | Attending: Emergency Medicine | Admitting: Emergency Medicine

## 2021-09-09 ENCOUNTER — Encounter (HOSPITAL_COMMUNITY): Payer: Self-pay | Admitting: *Deleted

## 2021-09-09 DIAGNOSIS — R1084 Generalized abdominal pain: Secondary | ICD-10-CM | POA: Insufficient documentation

## 2021-09-09 DIAGNOSIS — Z7982 Long term (current) use of aspirin: Secondary | ICD-10-CM | POA: Diagnosis not present

## 2021-09-09 DIAGNOSIS — Z79899 Other long term (current) drug therapy: Secondary | ICD-10-CM | POA: Insufficient documentation

## 2021-09-09 DIAGNOSIS — Z7902 Long term (current) use of antithrombotics/antiplatelets: Secondary | ICD-10-CM | POA: Diagnosis not present

## 2021-09-09 DIAGNOSIS — Z87891 Personal history of nicotine dependence: Secondary | ICD-10-CM | POA: Insufficient documentation

## 2021-09-09 DIAGNOSIS — I2510 Atherosclerotic heart disease of native coronary artery without angina pectoris: Secondary | ICD-10-CM | POA: Diagnosis not present

## 2021-09-09 DIAGNOSIS — Z85038 Personal history of other malignant neoplasm of large intestine: Secondary | ICD-10-CM | POA: Diagnosis not present

## 2021-09-09 DIAGNOSIS — Z951 Presence of aortocoronary bypass graft: Secondary | ICD-10-CM | POA: Insufficient documentation

## 2021-09-09 DIAGNOSIS — Z743 Need for continuous supervision: Secondary | ICD-10-CM | POA: Diagnosis not present

## 2021-09-09 DIAGNOSIS — R109 Unspecified abdominal pain: Secondary | ICD-10-CM | POA: Diagnosis not present

## 2021-09-09 DIAGNOSIS — R110 Nausea: Secondary | ICD-10-CM | POA: Diagnosis not present

## 2021-09-09 DIAGNOSIS — I119 Hypertensive heart disease without heart failure: Secondary | ICD-10-CM | POA: Insufficient documentation

## 2021-09-09 DIAGNOSIS — I10 Essential (primary) hypertension: Secondary | ICD-10-CM | POA: Diagnosis not present

## 2021-09-09 LAB — URINALYSIS, ROUTINE W REFLEX MICROSCOPIC
Bilirubin Urine: NEGATIVE
Glucose, UA: NEGATIVE mg/dL
Hgb urine dipstick: NEGATIVE
Ketones, ur: NEGATIVE mg/dL
Nitrite: NEGATIVE
Protein, ur: NEGATIVE mg/dL
Specific Gravity, Urine: 1.02 (ref 1.005–1.030)
pH: 5 (ref 5.0–8.0)

## 2021-09-09 LAB — COMPREHENSIVE METABOLIC PANEL
ALT: 16 U/L (ref 0–44)
AST: 21 U/L (ref 15–41)
Albumin: 4.1 g/dL (ref 3.5–5.0)
Alkaline Phosphatase: 41 U/L (ref 38–126)
Anion gap: 7 (ref 5–15)
BUN: 21 mg/dL (ref 8–23)
CO2: 28 mmol/L (ref 22–32)
Calcium: 9.7 mg/dL (ref 8.9–10.3)
Chloride: 103 mmol/L (ref 98–111)
Creatinine, Ser: 0.95 mg/dL (ref 0.61–1.24)
GFR, Estimated: >60 mL/min (ref >60)
Glucose, Bld: 125 mg/dL — ABNORMAL HIGH (ref 70–99)
Potassium: 4.6 mmol/L (ref 3.5–5.1)
Sodium: 138 mmol/L (ref 135–145)
Total Bilirubin: 0.6 mg/dL (ref 0.3–1.2)
Total Protein: 7.5 g/dL (ref 6.5–8.1)

## 2021-09-09 LAB — CBC WITH DIFFERENTIAL/PLATELET
Abs Immature Granulocytes: 0.04 10*3/uL (ref 0.00–0.07)
Basophils Absolute: 0 10*3/uL (ref 0.0–0.1)
Basophils Relative: 0 %
Eosinophils Absolute: 0.1 10*3/uL (ref 0.0–0.5)
Eosinophils Relative: 1 %
HCT: 42.5 % (ref 39.0–52.0)
Hemoglobin: 13.9 g/dL (ref 13.0–17.0)
Immature Granulocytes: 0 %
Lymphocytes Relative: 12 %
Lymphs Abs: 1.2 10*3/uL (ref 0.7–4.0)
MCH: 32.3 pg (ref 26.0–34.0)
MCHC: 32.7 g/dL (ref 30.0–36.0)
MCV: 98.6 fL (ref 80.0–100.0)
Monocytes Absolute: 0.5 10*3/uL (ref 0.1–1.0)
Monocytes Relative: 5 %
Neutro Abs: 8.2 10*3/uL — ABNORMAL HIGH (ref 1.7–7.7)
Neutrophils Relative %: 82 %
Platelets: 190 10*3/uL (ref 150–400)
RBC: 4.31 MIL/uL (ref 4.22–5.81)
RDW: 13.8 % (ref 11.5–15.5)
WBC: 10.1 10*3/uL (ref 4.0–10.5)
nRBC: 0 % (ref 0.0–0.2)

## 2021-09-09 LAB — LIPASE, BLOOD: Lipase: 29 U/L (ref 11–51)

## 2021-09-09 NOTE — ED Provider Notes (Signed)
[facility] EMERGENCY DEPARTMENT Provider Note   CSN: [id number] Arrival date & time: 09/09/21  1108     History Chief Complaint  Patient presents with   Abdominal Pain    Craig Fields is a 80 y.o. male with a history including CAD status post CABG, CVA with residual hemiparesis, patient being predominantly wheelchair-bound, also hypertension, hyperlipidemia surgical history significant for incisional hernia repair and partial colectomy secondary to a large benign polyp found on colonoscopy presenting for evaluation of abdominal pain which has resolved.  He was at home with his wife and ate a piece of pie and was drinking coffee when he developed  periumbilical abdominal pain which he states became very severe, cramping, was associated with nausea, no emesis, no diarrhea, no fevers.  He does have a history of some constipation, but had a bowel movement this morning and does not feel this is a problem currently.  He takes stool softeners on a daily basis and adds MiraLAX several times weekly.  However his wife gave him an antacid after which he had several episodes of eructation which completely resolved his symptoms.  He denies having any symptoms of chest pain, palpitations, shortness of breath, coughing and remains symptom-free since arrival here.  The history is provided by the patient.      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)    Age 54 or 3, no meds and no reoccurances   Skin lesion 12/07/2012    Skin tag 11/16/2011   Stroke 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   Hyperlipidemia LDL goal <70 02/21/2008   Morbid obesity ([place]) 02/21/2008   Essential hypertension, benign 02/21/2008   Coronary atherosclerosis of native coronary artery 02/21/2008   SLEEP APNEA 02/21/2008    Past Surgical History:  Procedure Laterality Date   COLONOSCOPY N/A 07/15/2015   Procedure: COLONOSCOPY;  Surgeon: Aviva Signs, MD;  Location: [facility] END[facility];  Service: Gastroenterology;  Laterality: N/A;   CORONARY ARTERY BYPASS GRAFT  2004   x4   INCISIONAL HERNIA REPAIR N/A 01/01/2013   Procedure: HERNIA REPAIR INCISIONAL;  Surgeon: Jamesetta So, MD;  Location: [facility] ORS;  Service: General;  Laterality: N/A;  umbilical area   INSERTION OF MESH N/A 01/01/2013   Procedure: INSERTION OF MESH;  Surgeon: Jamesetta So, MD;  Location: [facility] ORS;  Service: General;  Laterality: N/A;   Left inguinal  hernia repair  2005   [name]  2007       Family History  Problem Relation Age of Onset   Pancreatic cancer Mother  Heart disease Father    Prostate cancer Father    Stroke Brother    Heart disease Brother    Heart disease Brother    COPD Sister    Actinic keratosis Sister    Obesity Son    Anxiety disorder Son     Social History   Tobacco Use   Smoking status: Former    Packs/day: 0.50    Years: 5.00    Pack years: 2.50    Types: Cigarettes    Quit date: 12/27/1980    Years since quitting: 40.7   Smokeless tobacco: Former    Types: Chew    Quit date: 01/24/2013  Vaping Use   Vaping Use: Never used  Substance Use Topics   Alcohol use: No    Alcohol/week: 0.0 standard drinks    Drug use: No    Home Medications Prior to Admission medications   Medication Sig Start Date End Date Taking? Authorizing Provider  acetaminophen (TYLENOL) 650 MG CR tablet Take by mouth.     [provider]  aspirin 81 MG EC tablet Take 81 mg by mouth every evening.    [provider]  Cholecalciferol (VITAMIN D) 2000 units CAPS Take 1 capsule by mouth daily.    [provider]  clopidogrel (PLAVIX) 75 MG tablet TAKE 1 TABLET BY MOUTH EVERY DAY 08/31/21   Fayrene Helper, MD  Coenzyme Q10 (CO Q-10) 200 MG CAPS Take 1 tablet by mouth daily.     [provider]  KLOR-CON M20 20 MEQ tablet TAKE 2 TABLETS BY MOUTH DAILY 08/31/21   Fayrene Helper, MD  lisinopril (ZESTRIL) 10 MG tablet TAKE 1 TABLET BY MOUTH EVERY DAY 05/11/21   Fayrene Helper, MD  metoprolol tartrate (LOPRESSOR) 50 MG tablet TAKE 1/2 TABLET BY MOUTH 2 (TWO) TIMES DAILY. 08/31/21   Fayrene Helper, MD  Multiple Vitamins-Minerals (CVS SPECTRAVITE) TABS Take 1 tablet by mouth every evening.    [provider]  Omega-3 Fatty Acids (FISH OIL) 1200 MG CAPS Take 2 capsules by mouth at bedtime. 1200 mg    [provider]  polyethylene glycol powder (GLYCOLAX/MIRALAX) powder MIX TO TAKE 17 G DAILY AS DIRECTED Patient taking differently: Take 17 g by mouth every morning. 11/26/16   Fayrene Helper, MD  simvastatin (ZOCOR) 20 MG tablet TAKE 1 TABLET BY MOUTH EVERYDAY AT BEDTIME 12/01/20   Fayrene Helper, MD  sodium chloride (OCEAN) 0.65 % SOLN nasal spray Place 1 spray into both nostrils as needed for congestion.    [provider]  triamterene-hydrochlorothiazide (MAXZIDE-25) 37.5-25 MG tablet TAKE 1 TABLET BY MOUTH EVERY DAY IN THE MORNING 05/11/21   Fayrene Helper, MD  UNABLE TO FIND Commode assist chair 01/02/19   Fayrene Helper, MD  UNABLE TO FIND Bilateral new shoes  Left AFO with calliber plate  Dx Z99.357 0/17/79   Perlie Mayo, NP   UNABLE TO FIND Right custom torch walker x 1  Dx T90.300 02/04/20   Perlie Mayo, NP    Allergies    Bee venom and Niacin  Review of Systems   Review of Systems  Constitutional:  Negative for chills, diaphoresis and fever.  HENT: Negative.    Eyes: Negative.   Respiratory:  Negative for chest tightness and shortness of breath.   Cardiovascular:  Negative for chest pain.  Gastrointestinal:  Positive for abdominal pain and nausea. Negative for vomiting.  Genitourinary: Negative.   Musculoskeletal:  Negative  for arthralgias, joint swelling and neck pain.  Skin: Negative.  Negative for rash and wound.  Neurological:  Negative for dizziness, weakness, light-headedness, numbness and headaches.  Psychiatric/Behavioral: Negative.    All other systems reviewed and are negative.  Physical Exam Updated Vital Signs BP (!) 132/51    Pulse 61    Temp 98.5 F (36.9 C) (Oral)    Resp 17    Ht 5\' 9"  (1.753 m)    Wt 108.9 kg    SpO2 98%    BMI 35.44 kg/m   Physical Exam Vitals and nursing note reviewed.  Constitutional:      Appearance: He is well-developed.  HENT:     Head: Normocephalic and atraumatic.  Eyes:     Conjunctiva/sclera: Conjunctivae normal.  Cardiovascular:     Rate and Rhythm: Normal rate and regular rhythm.     Heart sounds: Normal heart sounds.  Pulmonary:     Effort: Pulmonary effort is normal.     Breath sounds: Normal breath sounds. No wheezing.  Abdominal:     General: Abdomen is protuberant. A surgical scar is present. Bowel sounds are normal.     Palpations: Abdomen is soft.     Tenderness: There is no abdominal tenderness. There is no guarding or rebound.  Musculoskeletal:        General: Normal range of motion.     Cervical back: Normal range of motion.  Skin:    General: Skin is warm and dry.  Neurological:     Mental Status: He is alert.    ED Results / Procedures / Treatments   Labs (all labs ordered are listed, but only abnormal results are  displayed) Labs Reviewed  CBC WITH DIFFERENTIAL/PLATELET - Abnormal; Notable for the following components:      Result Value   Neutro Abs 8.2 (*)    All other components within normal limits  COMPREHENSIVE METABOLIC PANEL - Abnormal; Notable for the following components:   Glucose, Bld 125 (*)    All other components within normal limits  URINALYSIS, ROUTINE W REFLEX MICROSCOPIC - Abnormal; Notable for the following components:   Leukocytes,Ua MODERATE (*)    Bacteria, UA RARE (*)    All other components within normal limits  LIPASE, BLOOD    EKG None  Radiology DG Abdomen 1 View  Result Date: 09/09/2021 CLINICAL DATA:  Abdominal pain EXAM: ABDOMEN - 1 VIEW COMPARISON:  Abdominal radiograph dated Jan 31, 2008 FINDINGS: Nonobstructive bowel gas pattern. Left pleural calcifications of the partially visualized left lung base, possibly due to prior hemothorax or empyema. Moderate degenerative changes of the lumbar spine. IMPRESSION: Nonobstructive bowel gas pattern. Electronically Signed   By: Yetta Glassman M.D.   On: 09/09/2021 14:40    Procedures Procedures   Medications Ordered in ED Medications - No data to display  ED Course  I have reviewed the triage vital signs and the nursing notes.  Pertinent labs & imaging results that were available during my care of the patient were reviewed by me and considered in my medical decision making (see chart for details).    MDM Rules/Calculators/A&P                         Exam is reassuring today as patient is completely symptom-free and has a benign abdomen on serial exams.  Labs and imaging reviewed and discussed with patient and wife at bedside.  We discussed return precautions for any return of symptoms.  He appears stable for discharge home.  The patient appears reasonably screened and/or stabilized for discharge and I doubt any other medical condition or other [facility] requiring further screening, evaluation, or treatment in the ED at  this time prior to discharge.     Final Clinical Impression(s) / ED Diagnoses Final diagnoses:  Generalized abdominal pain    Rx / DC Orders ED Discharge Orders     None        Landis Martins 09/10/21 1954    Truddie Hidden, MD 09/11/21 [phone]

## 2021-09-09 NOTE — ED Triage Notes (Signed)
Per report, pt called EMS for abd pain and ate some pie and some coffee, began to have abd pain.  Once EMS arrived pt began to belch and abd pain relieved.  Pt denies any abd pain.

## 2021-09-09 NOTE — Discharge Instructions (Signed)
Your exam, lab test and plain x-ray are reassuring today.  Given that your symptoms had resolved by the time you arrived here and you remain symptom-free I do not think you need any further testing today.  However if you have a return or worsening of your symptoms either call Dr. Moshe Cipro or return here for further evaluation.  I do recommend taking your MiraLAX daily for the next week instead of your stool softener to see if this gives you better control of your constipation.

## 2021-09-09 NOTE — ED Notes (Signed)
See triage notes. Pt denies any n/v/d. States pain was sudden this am to all over abd, after he belched he has had no pain since. Wife gave hi 2 tums pta of ems. A/o. Nad. Color wnl.

## 2021-09-11 ENCOUNTER — Other Ambulatory Visit: Payer: Self-pay

## 2021-09-11 ENCOUNTER — Ambulatory Visit (HOSPITAL_COMMUNITY)
Admission: RE | Admit: 2021-09-11 | Discharge: 2021-09-11 | Disposition: A | Discharge status: Home / Self Care | Payer: Medicare Other | Source: Ambulatory Visit | Attending: Cardiology | Admitting: Cardiology

## 2021-09-11 DIAGNOSIS — I350 Nonrheumatic aortic (valve) stenosis: Secondary | ICD-10-CM | POA: Diagnosis not present

## 2021-09-11 LAB — ECHOCARDIOGRAM COMPLETE
AR max vel: 0.98 cm2
AV Area VTI: 1.04 cm2
AV Area mean vel: 1.02 cm2
AV Mean grad: 26 mmHg
AV Peak grad: 53.9 mmHg
Ao pk vel: 3.67 m/s
Area-P 1/2: 2.73 cm2
Calc EF: 56.3 %
MV VTI: 1.99 cm2
P 1/2 time: 469 ms
S' Lateral: 3.3 cm
Single Plane A2C EF: 56.2 %
Single Plane A4C EF: 53.5 %

## 2021-09-11 NOTE — Progress Notes (Signed)
*  PRELIMINARY RESULTS* Echocardiogram 2D Echocardiogram has been performed.  Craig Fields 09/11/2021, 4:15 PM

## 2021-09-15 DIAGNOSIS — B351 Tinea unguium: Secondary | ICD-10-CM | POA: Diagnosis not present

## 2021-09-15 DIAGNOSIS — L84 Corns and callosities: Secondary | ICD-10-CM | POA: Diagnosis not present

## 2021-09-15 DIAGNOSIS — I739 Peripheral vascular disease, unspecified: Secondary | ICD-10-CM | POA: Diagnosis not present

## 2021-10-05 DIAGNOSIS — Z20822 Contact with and (suspected) exposure to covid-19: Secondary | ICD-10-CM | POA: Diagnosis not present

## 2021-10-23 DIAGNOSIS — Z20822 Contact with and (suspected) exposure to covid-19: Secondary | ICD-10-CM | POA: Diagnosis not present

## 2021-10-29 ENCOUNTER — Other Ambulatory Visit: Payer: Self-pay | Admitting: Family Medicine

## 2021-11-16 DIAGNOSIS — Z20822 Contact with and (suspected) exposure to covid-19: Secondary | ICD-10-CM | POA: Diagnosis not present

## 2021-11-20 ENCOUNTER — Other Ambulatory Visit: Payer: Self-pay | Admitting: Family Medicine

## 2021-11-20 ENCOUNTER — Telehealth: Payer: Self-pay | Admitting: *Deleted

## 2021-11-20 NOTE — Chronic Care Management (AMB) (Signed)
?  Chronic Care Management  ? ?Note ? ?11/20/2021 ?Name: Craig Fields MRN: [id number] DOB: 18-Apr-1941 ? ?Craig Fields is a 81 y.o. year old male who is a primary care patient of Moshe Cipro Norwood Levo, MD. I reached out to Craig Fields by phone today in response to a referral sent by Craig Fields's PCP. ? ?Craig Fields was given information about Chronic Care Management services today including:  ?CCM service includes personalized support from designated clinical staff supervised by his physician, including individualized plan of care and coordination with other care providers ?24/7 contact phone numbers for assistance for urgent and routine care needs. ?Service will only be billed when office clinical staff spend 20 minutes or more in a month to coordinate care. ?Only one practitioner may furnish and bill the service in a calendar month. ?The patient may stop CCM services at any time (effective at the end of the month) by phone call to the office staff. ?The patient is responsible for co-pay (up to 20% after annual deductible is met) if co-pay is required by the individual health plan.  ? ?Patient agreed to services and verbal consent obtained.  ? ?Follow up plan: ?Telephone appointment with care management team member scheduled for:11/24/21 ? ?Laverda Sorenson  ?Care Guide, Embedded Care Coordination ?[place]  Care Management  ?Direct Dial: [phone] ? ?

## 2021-11-24 ENCOUNTER — Ambulatory Visit: Payer: Medicare Other | Admitting: Family Medicine

## 2021-11-24 ENCOUNTER — Ambulatory Visit (INDEPENDENT_AMBULATORY_CARE_PROVIDER_SITE_OTHER): Payer: Medicare Other | Admitting: *Deleted

## 2021-11-24 DIAGNOSIS — I10 Essential (primary) hypertension: Secondary | ICD-10-CM

## 2021-11-24 DIAGNOSIS — I2510 Atherosclerotic heart disease of native coronary artery without angina pectoris: Secondary | ICD-10-CM

## 2021-11-24 DIAGNOSIS — E785 Hyperlipidemia, unspecified: Secondary | ICD-10-CM

## 2021-11-24 NOTE — Chronic Care Management (AMB) (Signed)
?Chronic Care Management  ? ?CCM RN Visit Note ? ?11/24/2021 ?Name: HERCHEL HOPKIN MRN: [id number] DOB: 01-11-1941 ? ?Subjective: ?Cord Wilczynski Sandin is a 81 y.o. year old male who is a primary care patient of Moshe Cipro Norwood Levo, MD. The care management team was consulted for assistance with disease management and care coordination needs.   ? ?Engaged with patient by telephone for initial visit in response to provider referral for case management and/or care coordination services.  ? ?Consent to Services:  ?The patient was given the following information about Chronic Care Management services today, agreed to services, and gave verbal consent: 1. CCM service includes personalized support from designated clinical staff supervised by the primary care provider, including individualized plan of care and coordination with other care providers 2. 24/7 contact phone numbers for assistance for urgent and routine care needs. 3. Service will only be billed when office clinical staff spend 20 minutes or more in a month to coordinate care. 4. Only one practitioner may furnish and bill the service in a calendar month. 5.The patient may stop CCM services at any time (effective at the end of the month) by phone call to the office staff. 6. The patient will be responsible for cost sharing (co-pay) of up to 20% of the service fee (after annual deductible is met). Patient agreed to services and consent obtained. ? ?Patient agreed to services and verbal consent obtained.  ? ?Assessment: Review of patient past medical history, allergies, medications, health status, including review of consultants reports, laboratory and other test data, was performed as part of comprehensive evaluation and provision of chronic care management services.  ? ?SDOH (Social Determinants of Health) assessments and interventions performed:  ?SDOH Interventions   ? ?Flowsheet Row Most Recent Value  ?SDOH Interventions   ?Food Insecurity Interventions Intervention Not  Indicated  ?Transportation Interventions Intervention Not Indicated  ? ?  ?  ? ?[place] ? ?Allergies  ?Allergen Reactions  ? Bee Venom Swelling  ? Niacin Other (See Comments)  ?  Muscle aches  ? ? ?Outpatient Encounter Medications as of 11/24/2021  ?Medication Sig  ? acetaminophen (TYLENOL) 650 MG CR tablet Take by mouth.   ? aspirin 81 MG EC tablet Take 81 mg by mouth every evening.  ? Cholecalciferol (VITAMIN D) 2000 units CAPS Take 1 capsule by mouth daily.  ? clopidogrel (PLAVIX) 75 MG tablet TAKE 1 TABLET BY MOUTH EVERY DAY  ? Coenzyme Q10 (CO Q-10) 200 MG CAPS Take 1 tablet by mouth daily.   ? KLOR-CON M20 20 MEQ tablet TAKE 2 TABLETS BY MOUTH DAILY  ? lisinopril (ZESTRIL) 10 MG tablet TAKE 1 TABLET BY MOUTH EVERY DAY  ? metoprolol tartrate (LOPRESSOR) 50 MG tablet TAKE 1/2 TABLET BY MOUTH 2 (TWO) TIMES DAILY.  ? Multiple Vitamins-Minerals ([employer] SPECTRAVITE) TABS Take 1 tablet by mouth every evening.  ? Omega-3 Fatty Acids (FISH OIL) 1200 MG CAPS Take 2 capsules by mouth at bedtime. 1200 mg  ? polyethylene glycol powder (GLYCOLAX/MIRALAX) powder MIX TO TAKE 17 G DAILY AS DIRECTED (Patient taking differently: Take 17 g by mouth every morning.)  ? simvastatin (ZOCOR) 20 MG tablet TAKE 1 TABLET BY MOUTH EVERYDAY AT BEDTIME  ? sodium chloride (OCEAN) 0.65 % SOLN nasal spray Place 1 spray into both nostrils as needed for congestion.  ? triamterene-hydrochlorothiazide (MAXZIDE-25) 37.5-25 MG tablet TAKE 1 TABLET BY MOUTH EVERY DAY IN THE MORNING  ? UNABLE TO FIND Commode assist chair (Patient not taking: Reported on  11/24/2021)  ? UNABLE TO FIND Bilateral new shoes  ?Left AFO with calliber plate  ?Dx [phone] (Patient not taking: Reported on 11/24/2021)  ? UNABLE TO FIND Right custom torch walker x 1  ?Dx [phone] (Patient not taking: Reported on 11/24/2021)  ? ?No facility-administered encounter medications on file as of 11/24/2021.  ? ? ?Patient Active Problem List  ? Diagnosis Date Noted  ? Valvular heart disease  05/27/2021  ? H/O colon cancer, stage I 05/27/2021  ? Recurrent incisional hernia   ? Left leg weakness 02/28/2013  ? Difficulty walking 02/28/2013  ? Prediabetes 07/19/2011  ? Back pain 07/26/2010  ? 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 Hyperlipidemia LDL goal <70 02/21/2008  ? Morbid obesity ([place]) 02/21/2008  ? Essential hypertension, benign 02/21/2008  ? Coronary atherosclerosis of native coronary artery 02/21/2008  ? SLEEP APNEA 02/21/2008  ? ? ?Conditions to be addressed/monitored:CAD, HTN, and HLD ? ?Care Plan : RN Care Manager Plan of Care  ?Updates made by Kassie Mends, RN since 11/24/2021 12:00 AM  ?  ? ?Problem: No plan of care established for management of chronic disease state  (CAD including HTN, HLD)   ?Priority: High  ?  ? 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 Pt reports his only exercise is walking through the house and getting outdoors to do errands, etc.  Patient does not have advanced directives and declines information.   ?Patient reports spouse checks his blood pressure on occasion and "readings are always good". ? ?RNCM Clinical Goal(s):  ?Patient will verbalize understanding of plan for management of CAD, HTN, and HLD as evidenced by patient report, review of EHR and  through collaboration with RN Care manager, provider, and care team.  ? ?Interventions: ?1:1  collaboration with primary care provider regarding development and update of comprehensive plan of care as evidenced by provider attestation and co-signature ?Inter-disciplinary care team collaboration (see longitudinal plan of care) ?Evaluation of current treatment plan related to  self management and patient's adherence to plan as established by provider ? ? ?CAD Interventions: (Status:  New goal. and Goal on track:  Yes.) Long Term Goal ?Assessed understanding of CAD diagnosis ?Medications reviewed including medications utilized in CAD treatment plan ?Provided education on importance of blood pressure control in management of CAD ?Provided education on Importance of limiting foods high in cholesterol ?Counseled on the importance of exercise goals with target of 150 minutes per week ?Reviewed Importance of taking all medications as prescribed ?Reviewed Importance of attending all scheduled provider appointments ?Education mailed to patient- heart healthy diet ?Reviewed safety precautions ? ? ?Hypertension Interventions:  (Status:  New goal. and Goal on track:  Yes.) Long Term Goal ?Last practice recorded BP readings:  ?BP Readings from Last 3 Encounters:  ?09/09/21 (!) 132/51  ?05/26/21 129/60  ?03/12/21 (!) 144/70  ?Most recent eGFR/CrCl:  ?  Lab Results  ?Component Value Date  ? EGFR 86 05/25/2021  ?  No components found for: CRCL ? ?Evaluation of current treatment plan related to hypertension self management and patient's adherence to plan as established by provider ?Reviewed medications with patient and discussed importance of compliance ?Discussed plans with patient for ongoing care management follow up and provided patient with direct contact information for care management team ?Advised patient, providing education and rationale, to monitor blood pressure daily and record, calling PCP for findings outside established parameters ?Discussed complications of poorly controlled blood pressure such as heart  disease, stroke, circulatory complications, vision complications, kidney impairment, sexual dysfunction  ?Education mailed to patient- low sodium diet ? ?Hyperlipidemia:  (Status: New goal. Goal on Track (progressing)

## 2021-11-24 NOTE — Patient Instructions (Signed)
Visit Information  ? ?Thank you for taking time to visit with me today. Please don't hesitate to contact me if I can be of assistance to you before our next scheduled telephone appointment. ? ?Following are the goals we discussed today:  ?Take medications as prescribed   ?Attend all scheduled provider appointments ?Call pharmacy for medication refills 3-7 days in advance of running out of medications ?Call provider office for new concerns or questions  ?check blood pressure 3 times per week ?choose a place to take my blood pressure (home, clinic or office, retail store) ?write blood pressure results in a log or diary ?keep a blood pressure log ?take blood pressure log to all doctor appointments ?eat more whole grains, fruits and vegetables, lean meats and healthy fats ?call for medicine refill 2 or 3 days before it runs out ?take all medications exactly as prescribed ?call doctor with any symptoms you believe are related to your medicine ?go to all doctor appointments as scheduled ?adhere to prescribed diet: heart healthy and low sodium diet ?Look over education mailed- heart health and low sodium diet ?Follow up with primary care provider on 01/01/22 ?fall prevention strategies: change position slowly, use assistive device such as walker or cane (per provider recommendations) when walking, keep walkways clear, have good lighting in room. It is important to contact your provider if you have any falls, maintain muscle strength/tone by exercise per provider recommendations. ? ?Our next appointment is by telephone on 02/09/22 at 9 am ? ?Please call the care guide team at [phone] if you need to cancel or reschedule your appointment.  ? ?If you are experiencing a Mental Health or [place] or need someone to talk to, please call the Suicide and Crisis Lifeline: 988 ?call the [place] National Suicide Prevention Lifeline: 909-870-2462 or TTY: 763-372-3134 TTY 402-228-3706) to talk to a trained  counselor ?call 1-800-273-TALK (toll free, 24 hour hotline) ?go to [facility] [address] [place] [phone]) ?call the [facility]: [phone] ?call 911  ? ?Following is a copy of your full care plan:  ?Care Plan : [place] of Care  ?Updates made by Kassie Mends, RN since 11/24/2021 12:00 AM  ?  ? ?Problem: No plan of care established for management of chronic disease state  (CAD including HTN, HLD)   ?Priority: High  ?  ? 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 Pt reports his only exercise is walking through the house and getting outdoors to do errands, etc.  Patient does not have advanced directives and declines information.   ?Patient reports spouse checks his blood pressure on occasion and "readings are always good". ? ?RNCM Clinical Goal(s):  ?Patient will verbalize understanding of plan for management of CAD, HTN, and HLD as evidenced by patient report, review of EHR and  through collaboration  with RN Care manager, provider, and care team.  ? ?Interventions: ?1:1 collaboration with primary care provider regarding development and update of comprehensive plan of care as evidenced by provider attestation and co-signature ?Inter-disciplinary care team collaboration (see longitudinal plan of care) ?Evaluation of current treatment plan related to   self management and patient's adherence to plan as established by provider ? ? ?CAD Interventions: (Status:  New goal. and Goal on track:  Yes.) Long Term Goal ?Assessed understanding of CAD diagnosis ?Medications reviewed including medications utilized in CAD treatment plan ?Provided education on importance of blood pressure control in management of CAD ?Provided education on Importance of limiting foods high in cholesterol ?Counseled on the importance of exercise goals with target of 150 minutes per week ?Reviewed Importance of taking all medications as prescribed ?Reviewed Importance of attending all scheduled provider appointments ?Education mailed to patient- heart healthy diet ?Reviewed safety precautions ? ? ?Hypertension Interventions:  (Status:  New goal. and Goal on track:  Yes.) Long Term Goal ?Last practice recorded BP readings:  ?BP Readings from Last 3 Encounters:  ?09/09/21 (!) 132/51  ?05/26/21 129/60  ?03/12/21 (!) 144/70  ?Most recent eGFR/CrCl:  ?Lab Results  ?Component Value Date  ? EGFR 86 05/25/2021  ?  No components found for: CRCL ? ?Evaluation of current treatment plan related to hypertension self management and patient's adherence to plan as established by provider ?Reviewed medications with patient and discussed importance of compliance ?Discussed plans with patient for ongoing care management follow up and provided patient with direct contact information for care management team ?Advised patient, providing education and rationale, to monitor blood pressure daily and record, calling PCP for findings outside established parameters ?Discussed complications of poorly controlled blood pressure such as heart disease, stroke, circulatory complications, vision complications, kidney impairment, sexual dysfunction  ?Education mailed to patient- low sodium diet ? ?Hyperlipidemia:  (Status: New goal. Goal on Track (progressing): YES.) Long Term Goal  ?Lab Results  ?Component Value Date  ? CHOL 132  05/25/2021  ? HDL 45 05/25/2021  ? [place] 71 05/25/2021  ? TRIG 85 05/25/2021  ? CHOLHDL 2.9 05/25/2021  ?  ? ?Medication review performed; medication list updated in electronic medical record.  ?Provider established cholesterol goals reviewed; ?Counseled on importance of regular laboratory monitoring as prescribed; ?Provided HLD educational materials; ?Reviewed importance of limiting foods high in cholesterol; ?Screening for signs and symptoms of depression related to chronic disease state;  ?Assessed social determinant of health barriers;  ? ?Patient Goals/Self-Care Activities: ?Take medications as prescribed   ?Attend all scheduled provider appointments ?Call pharmacy for medication refills 3-7 days in advance of running out of medications ?Call provider office for new concerns or questions  ?check blood pressure 3 times per week ?choose a place to take my blood pressure (home, clinic or office, retail store) ?write blood pressure results in a log or diary ?keep a blood pressure log ?take blood pressure log to all doctor appointments ?eat more whole grains, fruits and vegetables, lean meats and healthy fats ?- call for medicine refill 2 or 3 days before it runs out ?- take all medications exactly as prescribed ?- call doctor with any symptoms you believe are related to your medicine ?- go to all doctor appointments as scheduled ?- adhere to prescribed diet: heart healthy and low sodium diet ?Look over education mailed- heart health and low sodium diet ?Follow up with primary care provider on 01/01/22 ?fall prevention strategies: change position slowly, use assistive device such as  walker or cane (per provider recommendations) when walking, keep walkways clear, have good lighting in room. It is important to contact your provider if you have any falls, maintain muscle strength/tone by exercise per provider recommendations. ? ? ?  ? ? ?Consent to CCM Services: ?Craig Fields was given information about Chronic Care  Management services including:  ?CCM service includes personalized support from designated clinical staff supervised by his physician, including individualized plan of care and coordination with other care providers ?2

## 2021-12-08 DIAGNOSIS — I739 Peripheral vascular disease, unspecified: Secondary | ICD-10-CM | POA: Diagnosis not present

## 2021-12-08 DIAGNOSIS — L84 Corns and callosities: Secondary | ICD-10-CM | POA: Diagnosis not present

## 2021-12-08 DIAGNOSIS — B351 Tinea unguium: Secondary | ICD-10-CM | POA: Diagnosis not present

## 2021-12-11 DIAGNOSIS — I2510 Atherosclerotic heart disease of native coronary artery without angina pectoris: Secondary | ICD-10-CM | POA: Diagnosis not present

## 2021-12-11 DIAGNOSIS — I10 Essential (primary) hypertension: Secondary | ICD-10-CM

## 2021-12-11 DIAGNOSIS — E785 Hyperlipidemia, unspecified: Secondary | ICD-10-CM | POA: Diagnosis not present

## 2021-12-18 DIAGNOSIS — Z20822 Contact with and (suspected) exposure to covid-19: Secondary | ICD-10-CM | POA: Diagnosis not present

## 2021-12-22 DIAGNOSIS — Z20828 Contact with and (suspected) exposure to other viral communicable diseases: Secondary | ICD-10-CM | POA: Diagnosis not present

## 2021-12-28 DIAGNOSIS — Z20822 Contact with and (suspected) exposure to covid-19: Secondary | ICD-10-CM | POA: Diagnosis not present

## 2022-01-01 ENCOUNTER — Ambulatory Visit (INDEPENDENT_AMBULATORY_CARE_PROVIDER_SITE_OTHER): Payer: Medicare Other | Admitting: Family Medicine

## 2022-01-01 ENCOUNTER — Encounter: Payer: Self-pay | Admitting: Family Medicine

## 2022-01-01 VITALS — BP 135/51 | HR 62 | Ht 70.0 in | Wt 230.1 lb

## 2022-01-01 DIAGNOSIS — Z125 Encounter for screening for malignant neoplasm of prostate: Secondary | ICD-10-CM

## 2022-01-01 DIAGNOSIS — I2510 Atherosclerotic heart disease of native coronary artery without angina pectoris: Secondary | ICD-10-CM | POA: Diagnosis not present

## 2022-01-01 DIAGNOSIS — E785 Hyperlipidemia, unspecified: Secondary | ICD-10-CM

## 2022-01-01 DIAGNOSIS — I10 Essential (primary) hypertension: Secondary | ICD-10-CM

## 2022-01-01 DIAGNOSIS — E559 Vitamin D deficiency, unspecified: Secondary | ICD-10-CM | POA: Diagnosis not present

## 2022-01-01 DIAGNOSIS — R262 Difficulty in walking, not elsewhere classified: Secondary | ICD-10-CM | POA: Diagnosis not present

## 2022-01-01 DIAGNOSIS — E669 Obesity, unspecified: Secondary | ICD-10-CM

## 2022-01-01 NOTE — Patient Instructions (Signed)
F/U in 6 months, call if you need me before ? ?No changes in medication,  ? ?Labs today CBc, lipid, cmp and EGFr, TSH, vit D, and PSa today ? ?Keep doing what you are doing ? ?No falls ? ?We will watch the numbness and weakness in your right hand, if worsens, or get more cioncerned, call, I will refer you to orthopedics ? ?Thanks for choosing [facility], we consider it a privelige to serve you. ? ?

## 2022-01-02 LAB — CMP14+EGFR
ALT: 13 IU/L (ref 0–44)
AST: 21 IU/L (ref 0–40)
Albumin/Globulin Ratio: 1.6 (ref 1.2–2.2)
Albumin: 4.1 g/dL (ref 3.6–4.6)
Alkaline Phosphatase: 52 IU/L (ref 44–121)
BUN/Creatinine Ratio: 19 (ref 10–24)
BUN: 20 mg/dL (ref 8–27)
Bilirubin Total: 0.5 mg/dL (ref 0.0–1.2)
CO2: 23 mmol/L (ref 20–29)
Calcium: 9.4 mg/dL (ref 8.6–10.2)
Chloride: 102 mmol/L (ref 96–106)
Creatinine, Ser: 1.03 mg/dL (ref 0.76–1.27)
Globulin, Total: 2.6 g/dL (ref 1.5–4.5)
Glucose: 89 mg/dL (ref 70–99)
Potassium: 4.6 mmol/L (ref 3.5–5.2)
Sodium: 139 mmol/L (ref 134–144)
Total Protein: 6.7 g/dL (ref 6.0–8.5)
eGFR: 73 mL/min/{1.73_m2} (ref >59)

## 2022-01-02 LAB — TSH: TSH: 3.06 u[IU]/mL (ref 0.450–4.500)

## 2022-01-02 LAB — CBC
Hematocrit: 39.3 % (ref 37.5–51.0)
Hemoglobin: 13.2 g/dL (ref 13.0–17.7)
MCH: 32 pg (ref 26.6–33.0)
MCHC: 33.6 g/dL (ref 31.5–35.7)
MCV: 95 fL (ref 79–97)
Platelets: 179 10*3/uL (ref 150–450)
RBC: 4.12 x10E6/uL — ABNORMAL LOW (ref 4.14–5.80)
RDW: 12.7 % (ref 11.6–15.4)
WBC: 7.1 10*3/uL (ref 3.4–10.8)

## 2022-01-02 LAB — PSA: Prostate Specific Ag, Serum: 5.1 ng/mL — ABNORMAL HIGH (ref 0.0–4.0)

## 2022-01-02 LAB — LIPID PANEL
Chol/HDL Ratio: 3.1 ratio (ref 0.0–5.0)
Cholesterol, Total: 126 mg/dL (ref 100–199)
HDL: 41 mg/dL (ref >39)
LDL Chol Calc (NIH): 64 mg/dL (ref 0–99)
Triglycerides: 118 mg/dL (ref 0–149)
VLDL Cholesterol Cal: 21 mg/dL (ref 5–40)

## 2022-01-02 LAB — VITAMIN D 25 HYDROXY (VIT D DEFICIENCY, FRACTURES): Vit D, 25-Hydroxy: 59.5 ng/mL (ref 30.0–100.0)

## 2022-01-03 ENCOUNTER — Encounter: Payer: Self-pay | Admitting: Family Medicine

## 2022-01-03 DIAGNOSIS — Z20822 Contact with and (suspected) exposure to covid-19: Secondary | ICD-10-CM | POA: Diagnosis not present

## 2022-01-03 NOTE — Assessment & Plan Note (Signed)
Home safety reviewed as well as fall precautions ?

## 2022-01-03 NOTE — Assessment & Plan Note (Signed)
DASH diet and commitment to daily physical activity for a minimum of 30 minutes discussed and encouraged, as a part of hypertension management. ?The importance of attaining a healthy weight is also discussed. ? ? ?  01/01/2022  ?  9:55 AM 09/09/2021  ?  3:21 PM 09/09/2021  ? 11:31 AM 09/09/2021  ? 11:28 AM 06/24/2021  ? 10:31 AM 05/26/2021  ?  2:06 PM 05/26/2021  ?  1:26 PM  ?BP/Weight  ?Systolic BP 753 005 110   129 129  ?Diastolic BP 51 51 56   60 47  ?Wt. (Lbs) 230.08   240 236  236  ?BMI 33.01 kg/m2   35.44 kg/m2 33.86 kg/m2  33.86 kg/m2  ? ? ? ?Controlled, no change in medication ? ?

## 2022-01-03 NOTE — Assessment & Plan Note (Signed)
Hyperlipidemia:Low fat diet discussed and encouraged. ? ? ?Lipid Panel  ?Lab Results  ?Component Value Date  ? CHOL 126 01/01/2022  ? HDL 41 01/01/2022  ? [place] 64 01/01/2022  ? TRIG 118 01/01/2022  ? CHOLHDL 3.1 01/01/2022  ? ? ?Controlled, no change in medication ? ? ?

## 2022-01-03 NOTE — Progress Notes (Signed)
? Craig Fields     MRN: [id number]      DOB: 06/27/1941 ? ? ?HPI ?[name] is here for follow up and re-evaluation of chronic medical conditions, medication management and review of any available recent lab and radiology data.  ?Preventive health is updated, specifically  Cancer screening and Immunization.   ?C/o numbness and weak grip in right hand progressing, does not want Ortho referral at this time, not painful ?ROS ?Denies recent fever or chills. ?Denies sinus pressure, nasal congestion, ear pain or sore throat. ?Denies chest congestion, productive cough or wheezing. ?Denies chest pains, palpitations and leg swelling ?Denies abdominal pain, nausea, vomiting,diarrhea or constipation.   ?Denies dysuria, frequency, hesitancy or incontinence. ?Chronic and unchanged  joint pain, swelling and limitation in mobility. ?Denies headaches, seizures,  ?Denies depression, anxiety or insomnia. ?Denies skin break down or rash. ? ? ?PE ? ?BP (!) 135/51   Pulse 62   Ht '5\' 10"'$  (1.778 m)   Wt 230 lb 1.3 oz (104.4 kg)   SpO2 95%   BMI 33.01 kg/m?  ? ?Patient alert and oriented and in no cardiopulmonary distress. ? ?HEENT: No facial asymmetry, EOMI,     Neck supple . ? ?Chest: Clear to auscultation bilaterally. ? ?CVS: S1, S2 no murmurs, no S3.Regular rate. ? ?ABD: Soft non tender.  ? ?Ext: No edema ? ?MS: Decreased ROM spine hips, shoulders and knees. ?Skin: Intact, no ulcerations or rash noted. ? ?Psych: Good eye contact, normal affect. Memory intact not anxious or depressed appearing. ? ?CNS: CN 2-12 intact, left hemiplegia with RUE contracture, grade 2 grip in right hand ?Assessment & Plan ?Essential hypertension, benign ?DASH diet and commitment to daily physical activity for a minimum of 30 minutes discussed and encouraged, as a part of hypertension management. ?The importance of attaining a healthy weight is also discussed. ? ? ?  01/01/2022  ?  9:55 AM 09/09/2021  ?  3:21 PM 09/09/2021  ? 11:31 AM 09/09/2021  ? 11:28  AM 06/24/2021  ? 10:31 AM 05/26/2021  ?  2:06 PM 05/26/2021  ?  1:26 PM  ?BP/Weight  ?Systolic BP 354 562 563   129 129  ?Diastolic BP 51 51 56   60 47  ?Wt. (Lbs) 230.08   240 236  236  ?BMI 33.01 kg/m2   35.44 kg/m2 33.86 kg/m2  33.86 kg/m2  ? ? ? ?Controlled, no change in medication ? ? ?Hyperlipidemia LDL goal <70 ?Hyperlipidemia:Low fat diet discussed and encouraged. ? ? ?Lipid Panel  ?Lab Results  ?Component Value Date  ? CHOL 126 01/01/2022  ? HDL 41 01/01/2022  ? [place] 64 01/01/2022  ? TRIG 118 01/01/2022  ? CHOLHDL 3.1 01/01/2022  ? ? ?Controlled, no change in medication ? ? ? ?Morbid obesity ([place]) ?Improved ? ?Patient re-educated about  the importance of commitment to a  minimum of 150 minutes of exercise per week as able. ? ?The importance of healthy food choices with portion control discussed, as well as eating regularly and within a 12 hour window most days. ?The need to choose "clean , green" food 50 to 75% of the time is discussed, as well as to make water the primary drink and set a goal of 64 ounces water daily. ? ?  ? ?  01/01/2022  ?  9:55 AM 09/09/2021  ? 11:28 AM 06/24/2021  ? 10:31 AM  ?Weight /BMI  ?Weight 230 lb 1.3 oz 240 lb 236 lb  ?Height '5\' 10"'$  (1.778 m)  $'5\' 9"'i$  (1.753 m) '5\' 10"'$  (1.778 m)  ?BMI 33.01 kg/m2 35.44 kg/m2 33.86 kg/m2  ? ? ? ? ?Difficulty walking ?Home safety reviewed as well as fall precautions ? ? ?

## 2022-01-03 NOTE — Assessment & Plan Note (Signed)
Improved ? ?Patient re-educated about  the importance of commitment to a  minimum of 150 minutes of exercise per week as able. ? ?The importance of healthy food choices with portion control discussed, as well as eating regularly and within a 12 hour window most days. ?The need to choose "clean , green" food 50 to 75% of the time is discussed, as well as to make water the primary drink and set a goal of 64 ounces water daily. ? ?  ? ?  01/01/2022  ?  9:55 AM 09/09/2021  ? 11:28 AM 06/24/2021  ? 10:31 AM  ?Weight /BMI  ?Weight 230 lb 1.3 oz 240 lb 236 lb  ?Height '5\' 10"'$  (1.778 m) '5\' 9"'$  (1.753 m) '5\' 10"'$  (1.778 m)  ?BMI 33.01 kg/m2 35.44 kg/m2 33.86 kg/m2  ? ? ? ?

## 2022-01-05 DIAGNOSIS — Z20822 Contact with and (suspected) exposure to covid-19: Secondary | ICD-10-CM | POA: Diagnosis not present

## 2022-01-06 ENCOUNTER — Other Ambulatory Visit: Payer: Self-pay

## 2022-01-06 DIAGNOSIS — R972 Elevated prostate specific antigen [PSA]: Secondary | ICD-10-CM

## 2022-01-11 DIAGNOSIS — Z20822 Contact with and (suspected) exposure to covid-19: Secondary | ICD-10-CM | POA: Diagnosis not present

## 2022-01-18 DIAGNOSIS — Z20822 Contact with and (suspected) exposure to covid-19: Secondary | ICD-10-CM | POA: Diagnosis not present

## 2022-01-19 DIAGNOSIS — Z20822 Contact with and (suspected) exposure to covid-19: Secondary | ICD-10-CM | POA: Diagnosis not present

## 2022-01-20 DIAGNOSIS — R059 Cough, unspecified: Secondary | ICD-10-CM | POA: Diagnosis not present

## 2022-01-20 DIAGNOSIS — Z20822 Contact with and (suspected) exposure to covid-19: Secondary | ICD-10-CM | POA: Diagnosis not present

## 2022-01-20 DIAGNOSIS — R051 Acute cough: Secondary | ICD-10-CM | POA: Diagnosis not present

## 2022-02-02 ENCOUNTER — Ambulatory Visit: Payer: Medicare Other | Admitting: Urology

## 2022-02-09 ENCOUNTER — Telehealth: Payer: Medicare Other

## 2022-02-09 ENCOUNTER — Telehealth: Payer: Self-pay | Admitting: *Deleted

## 2022-02-09 NOTE — Telephone Encounter (Signed)
  Care Management   Follow Up Note   02/09/2022 Name: Craig Fields MRN: [id number] DOB: 1941/08/11   Referred by: Fayrene Helper, MD Reason for referral : Chronic Care Management (CAD, HTN)   An unsuccessful telephone outreach was attempted today. The patient was referred to the case management team for assistance with care management and care coordination.   Follow Up Plan: Telephone follow up appointment with care management team member scheduled for: upon care guide rescheduling.  Jacqlyn Larsen [facility], BSN RN Case Manager [facility] [phone]

## 2022-02-16 DIAGNOSIS — L84 Corns and callosities: Secondary | ICD-10-CM | POA: Diagnosis not present

## 2022-02-16 DIAGNOSIS — I739 Peripheral vascular disease, unspecified: Secondary | ICD-10-CM | POA: Diagnosis not present

## 2022-02-16 DIAGNOSIS — B351 Tinea unguium: Secondary | ICD-10-CM | POA: Diagnosis not present

## 2022-03-04 ENCOUNTER — Telehealth: Payer: Self-pay

## 2022-03-04 NOTE — Chronic Care Management (AMB) (Unsigned)
  Chronic Care Management Note  03/04/2022 Name: Craig Fields MRN: [id number] DOB: 1940-10-30  Craig Fields is a 81 y.o. year old male who is a primary care patient of Fayrene Helper, MD and is actively engaged with the care management team. I reached out to [place] by phone today to assist with re-scheduling a follow up visit with the RN Case Manager  Follow up plan: Unsuccessful telephone outreach attempt made. A HIPAA compliant phone message was left for the patient providing contact information and requesting a return call.  The care management team will reach out to the patient again over the next 7 days.  If patient returns call to provider office, please advise to call [facility]  at [place], [place], [place], [place] [address] Direct Dial: [phone] [email or web address]: [email or web address]

## 2022-03-09 NOTE — Chronic Care Management (AMB) (Signed)
  Chronic Care Management Note  03/09/2022 Name: Craig Fields MRN: [id number] DOB: 11/28/1940  Craig Fields is a 81 y.o. year old male who is a primary care patient of Kerri Perches, MD and is actively engaged with the care management team. I reached out to Craig Fields by phone today to assist with re-scheduling a follow up visit with the RN Case Manager  Follow up plan: Telephone appointment with care management team member scheduled for:04/01/2022  Craig Fields, RMA Care Guide, Embedded Care Coordination [facility]  [place], [place] [address] Direct Dial: [phone] [email or web address] Website: [email or web address]

## 2022-03-10 ENCOUNTER — Other Ambulatory Visit: Payer: Self-pay | Admitting: Family Medicine

## 2022-04-01 ENCOUNTER — Ambulatory Visit (INDEPENDENT_AMBULATORY_CARE_PROVIDER_SITE_OTHER): Payer: Medicare Other | Admitting: *Deleted

## 2022-04-01 NOTE — Patient Instructions (Signed)
Visit Information  Thank you for taking time to visit with me today. Please don't hesitate to contact me if I can be of assistance to you before our next scheduled telephone appointment.  Following are the goals we discussed today:  Take medications as prescribed   Attend all scheduled provider appointments Call pharmacy for medication refills 3-7 days in advance of running out of medications Call provider office for new concerns or questions  check blood pressure 3 times per week choose a place to take my blood pressure (home, clinic or office, retail store) write blood pressure results in a log or diary keep a blood pressure log take blood pressure log to all doctor appointments keep all doctor appointments take medications for blood pressure exactly as prescribed eat more whole grains, fruits and vegetables, lean meats and healthy fats - call for medicine refill 2 or 3 days before it runs out - take all medications exactly as prescribed - call doctor with any symptoms you believe are related to your medicine - go to all doctor appointments as scheduled - adhere to prescribed diet: heart healthy and low sodium diet Read food labels, check sodium content fall prevention strategies: change position slowly, use assistive device such as walker or cane (per provider recommendations) when walking, keep walkways clear, have good lighting in room. It is important to contact your provider if you have any falls, maintain muscle strength/tone by exercise per provider recommendations. Case closure today   Please call the care guide team at [phone] if you need to cancel or reschedule your appointment.   If you are experiencing a Mental Health or [place] or need someone to talk to, please call the Suicide and Crisis Lifeline: 988 call the [place] National Suicide Prevention Lifeline: 916-508-8986 or TTY: (307)824-7572 TTY 979 869 5868) to talk to a trained counselor call  1-800-273-TALK (toll free, 24 hour hotline) go to [facility] [address], [place] [phone]) call the [place]: [phone] call 911   The patient verbalized understanding of instructions, educational materials, and care plan provided today and DECLINED offer to receive copy of patient instructions, educational materials, and care plan.   Jacqlyn Larsen [facility], BSN RN Case Manager [facility] [phone]

## 2022-04-01 NOTE — Chronic Care Management (AMB) (Signed)
Chronic Care Management   CCM RN Visit Note  04/01/2022 Name: Craig Fields MRN: [id number] DOB: 04-20-1941  Subjective: Craig Fields is a 81 y.o. year old male who is a primary care patient of Craig Helper, MD. The care management team was consulted for assistance with disease management and care coordination needs.    Engaged with patient by telephone for follow up visit in response to provider referral for case management and/or care coordination services.   Consent to Services:  The patient was given information about Chronic Care Management services, agreed to services, and gave verbal consent prior to initiation of services.  Please see initial visit note for detailed documentation.   Patient agreed to services and verbal consent obtained.   Assessment: Review of patient past medical history, allergies, medications, health status, including review of consultants reports, laboratory and other test data, was performed as part of comprehensive evaluation and provision of chronic care management services.   SDOH (Social Determinants of Health) assessments and interventions performed:    CCM Care Plan  Allergies  Allergen Reactions   Bee Venom Swelling   Niacin Other (See Comments)    Muscle aches    Outpatient Encounter Medications as of 04/01/2022  Medication Sig   acetaminophen (TYLENOL) 650 MG CR tablet Take by mouth.    aspirin 81 MG EC tablet Take 81 mg by mouth every evening.   Cholecalciferol (VITAMIN D) 2000 units CAPS Take 1 capsule by mouth daily.   clopidogrel (PLAVIX) 75 MG tablet TAKE 1 TABLET BY MOUTH EVERY DAY   Coenzyme Q10 (CO Q-10) 200 MG CAPS Take 1 tablet by mouth daily.    KLOR-CON M20 20 MEQ tablet TAKE 2 TABLETS BY MOUTH DAILY   lisinopril (ZESTRIL) 10 MG tablet TAKE 1 TABLET BY MOUTH EVERY DAY   metoprolol tartrate (LOPRESSOR) 50 MG tablet TAKE HALF A TABLET BY MOUTH 2 (TWO) TIMES DAILY.   Multiple Vitamins-Minerals ([employer] SPECTRAVITE) TABS Take 1  tablet by mouth every evening.   Omega-3 Fatty Acids (FISH OIL) 1200 MG CAPS Take 2 capsules by mouth at bedtime. 1200 mg   polyethylene glycol powder (GLYCOLAX/MIRALAX) powder MIX TO TAKE 17 G DAILY AS DIRECTED (Patient taking differently: Take 17 g by mouth every morning.)   simvastatin (ZOCOR) 20 MG tablet TAKE 1 TABLET BY MOUTH EVERYDAY AT BEDTIME   sodium chloride (OCEAN) 0.65 % SOLN nasal spray Place 1 spray into both nostrils as needed for congestion.   triamterene-hydrochlorothiazide (MAXZIDE-25) 37.5-25 MG tablet TAKE 1 TABLET BY MOUTH EVERY DAY IN THE MORNING   UNABLE TO FIND Commode assist chair   UNABLE TO FIND Bilateral new shoes  Left AFO with calliber plate  Dx N81.771   UNABLE TO FIND Right custom torch walker x 1  Dx H65.790   No facility-administered encounter medications on file as of 04/01/2022.    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[place]) 11/05/2008   Hyperlipidemia LDL goal <70 02/21/2008   Morbid obesity ([place]) 02/21/2008   Essential hypertension, benign 02/21/2008   Coronary atherosclerosis of native coronary artery 02/21/2008   SLEEP APNEA 02/21/2008    Conditions to be addressed/monitored:CAD and HTN  Care Plan : RN  Care Manager Plan of Care  Updates made by Farmer, Julie A, RN since 04/01/2022 12:00 AM  Completed 04/01/2022   Problem: No plan of care established for management of chronic disease state  (CAD including HTN, HLD) Resolved 04/01/2022  Priority: High     Long-Range Goal: Development of plan of care for chronic disease management  (CAD including HTN, HLD) Completed 04/01/2022  Start Date: 11/24/2021  Expected End Date: 05/23/2022  Priority: High  Note:    Current Barriers:  Knowledge Deficits related to plan of care for management of CAD, HTN, and HLD  Patient reports he lives with spouse and has grandson that assists him also, pt reports 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 Pt reports his only exercise is walking through the house and getting outdoors to do errands, etc.  Patient does not have advanced directives and declines information.   Patient reports spouse checks his blood pressure on occasion and "readings are always good". No new concerns reported today  RNCM Clinical Goal(s):  Patient will verbalize understanding of plan for management of CAD, HTN, and HLD as evidenced by patient report, review of EHR and  through collaboration with RN Care manager, provider, and care team.   Interventions: 1:1 collaboration with primary care provider regarding development and update of comprehensive plan of care as evidenced by provider attestation and co-signature Inter-disciplinary care team collaboration (see longitudinal plan of care) Evaluation of current treatment plan related to  self management and patient's adherence to plan as established by provider   CAD Interventions: (Status:  New goal. and Goal on track:  Yes.) Long Term Goal Assessed understanding of CAD diagnosis Medications reviewed including medications utilized in CAD treatment plan Provided education on importance of blood pressure control in management of CAD Provided education on Importance of limiting foods high in cholesterol Counseled on the importance of exercise goals with target of 150 minutes per week Reviewed Importance of taking all medications as prescribed Reviewed Importance of attending all scheduled provider appointments Reinforced  importance of following heart healthy diet and reading food labels Reinforced safety precautions   Hypertension Interventions:  (Status:  New goal. and Goal on track:  Yes.) Long Term Goal Last practice recorded BP readings:  BP Readings from Last 3 Encounters:  09/09/21 (!) 132/51  05/26/21 129/60  03/12/21 (!) 144/70  Most recent eGFR/CrCl:  Lab Results  Component Value Date   EGFR 86 05/25/2021    No components found for: CRCL  Evaluation of current treatment plan related to hypertension self management and patient's adherence to plan as established by provider Reviewed medications with patient and discussed importance of compliance Discussed plans with patient for ongoing care management follow up and provided patient with direct contact information for care management team Advised patient, providing education and rationale, to monitor blood pressure daily and record, calling PCP for findings outside established parameters Discussed complications of poorly controlled blood pressure such as heart disease, stroke, circulatory complications, vision complications, kidney impairment, sexual dysfunction  Reinforced low sodium diet  Hyperlipidemia:  (Status: New goal. Goal on Track (progressing): YES.) Long Term Goal  Lab Results  Component Value Date   CHOL 132 05/25/2021   HDL 45 05/25/2021   LDLCALC 71 05/25/2021     TRIG 85 05/25/2021   CHOLHDL 2.9 05/25/2021     Medication review performed; medication list updated in electronic medical record.  Provider established cholesterol goals reviewed; Counseled on importance of regular laboratory monitoring as prescribed; Reviewed importance of limiting foods high in cholesterol; Reviewed plan of care with patient including case closure today  Patient Goals/Self-Care Activities: Take medications as prescribed   Attend all scheduled provider appointments Call pharmacy for medication refills 3-7 days in advance of running out of  medications Call provider office for new concerns or questions  check blood pressure 3 times per week choose a place to take my blood pressure (home, clinic or office, retail store) write blood pressure results in a log or diary keep a blood pressure log take blood pressure log to all doctor appointments keep all doctor appointments take medications for blood pressure exactly as prescribed eat more whole grains, fruits and vegetables, lean meats and healthy fats - call for medicine refill 2 or 3 days before it runs out - take all medications exactly as prescribed - call doctor with any symptoms you believe are related to your medicine - go to all doctor appointments as scheduled - adhere to prescribed diet: heart healthy and low sodium diet Read food labels, check sodium content fall prevention strategies: change position slowly, use assistive device such as walker or cane (per provider recommendations) when walking, keep walkways clear, have good lighting in room. It is important to contact your provider if you have any falls, maintain muscle strength/tone by exercise per provider recommendations. Case closure today       Plan:No further follow up required: case closure today  Jacqlyn Larsen [facility], BSN RN Case Manager [facility] [phone]

## 2022-04-09 ENCOUNTER — Other Ambulatory Visit: Payer: Self-pay

## 2022-04-09 ENCOUNTER — Telehealth: Payer: Self-pay

## 2022-04-12 DIAGNOSIS — I10 Essential (primary) hypertension: Secondary | ICD-10-CM

## 2022-04-12 DIAGNOSIS — I2510 Atherosclerotic heart disease of native coronary artery without angina pectoris: Secondary | ICD-10-CM

## 2022-04-30 ENCOUNTER — Other Ambulatory Visit: Payer: Self-pay | Admitting: Family Medicine

## 2022-05-04 DIAGNOSIS — I739 Peripheral vascular disease, unspecified: Secondary | ICD-10-CM | POA: Diagnosis not present

## 2022-05-04 DIAGNOSIS — B351 Tinea unguium: Secondary | ICD-10-CM | POA: Diagnosis not present

## 2022-05-04 DIAGNOSIS — L84 Corns and callosities: Secondary | ICD-10-CM | POA: Diagnosis not present

## 2022-05-19 ENCOUNTER — Other Ambulatory Visit: Payer: Self-pay | Admitting: Family Medicine

## 2022-05-24 ENCOUNTER — Telehealth: Payer: Self-pay

## 2022-05-24 ENCOUNTER — Other Ambulatory Visit: Payer: Self-pay

## 2022-05-24 ENCOUNTER — Emergency Department (HOSPITAL_COMMUNITY)
Admission: EM | Admit: 2022-05-24 | Discharge: 2022-05-24 | Disposition: A | Discharge status: Home / Self Care | Payer: Medicare Other | Attending: Emergency Medicine | Admitting: Emergency Medicine

## 2022-05-24 ENCOUNTER — Encounter (HOSPITAL_COMMUNITY): Payer: Self-pay

## 2022-05-24 ENCOUNTER — Emergency Department (HOSPITAL_COMMUNITY): Payer: Medicare Other

## 2022-05-24 ENCOUNTER — Telehealth: Payer: Self-pay | Admitting: Family Medicine

## 2022-05-24 DIAGNOSIS — W19XXXA Unspecified fall, initial encounter: Secondary | ICD-10-CM | POA: Diagnosis not present

## 2022-05-24 DIAGNOSIS — M47816 Spondylosis without myelopathy or radiculopathy, lumbar region: Secondary | ICD-10-CM | POA: Diagnosis not present

## 2022-05-24 DIAGNOSIS — M1611 Unilateral primary osteoarthritis, right hip: Secondary | ICD-10-CM | POA: Diagnosis not present

## 2022-05-24 DIAGNOSIS — Z7902 Long term (current) use of antithrombotics/antiplatelets: Secondary | ICD-10-CM | POA: Diagnosis not present

## 2022-05-24 DIAGNOSIS — R5381 Other malaise: Secondary | ICD-10-CM | POA: Diagnosis not present

## 2022-05-24 DIAGNOSIS — S79911A Unspecified injury of right hip, initial encounter: Secondary | ICD-10-CM | POA: Diagnosis not present

## 2022-05-24 DIAGNOSIS — I959 Hypotension, unspecified: Secondary | ICD-10-CM | POA: Diagnosis not present

## 2022-05-24 DIAGNOSIS — M25551 Pain in right hip: Secondary | ICD-10-CM | POA: Diagnosis not present

## 2022-05-24 DIAGNOSIS — Z7982 Long term (current) use of aspirin: Secondary | ICD-10-CM | POA: Diagnosis not present

## 2022-05-24 DIAGNOSIS — Z79899 Other long term (current) drug therapy: Secondary | ICD-10-CM | POA: Insufficient documentation

## 2022-05-24 DIAGNOSIS — R531 Weakness: Secondary | ICD-10-CM | POA: Diagnosis not present

## 2022-05-24 DIAGNOSIS — M8588 Other specified disorders of bone density and structure, other site: Secondary | ICD-10-CM | POA: Diagnosis not present

## 2022-05-24 DIAGNOSIS — Z7401 Bed confinement status: Secondary | ICD-10-CM | POA: Diagnosis not present

## 2022-05-24 DIAGNOSIS — K4090 Unilateral inguinal hernia, without obstruction or gangrene, not specified as recurrent: Secondary | ICD-10-CM | POA: Insufficient documentation

## 2022-05-24 MED ORDER — OXYCODONE-ACETAMINOPHEN 5-325 MG PO TABS: 1.0000 | ORAL_TABLET | Freq: Three times a day (TID) | ORAL | 0 refills | Status: DC | PRN

## 2022-05-24 MED ORDER — ONDANSETRON 4 MG PO TBDP
4.0000 mg | ORAL_TABLET | Freq: Once | ORAL | Status: AC
  Administered 2022-05-24: 4 mg via ORAL
  Filled 2022-05-24: qty 1

## 2022-05-24 MED ORDER — MORPHINE SULFATE (PF) 4 MG/ML IV SOLN
4.0000 mg | Freq: Once | INTRAVENOUS | Status: AC
  Administered 2022-05-24: 4 mg via INTRAMUSCULAR
  Filled 2022-05-24: qty 1

## 2022-05-24 MED ORDER — DEXAMETHASONE SODIUM PHOSPHATE 10 MG/ML IJ SOLN
10.0000 mg | Freq: Once | INTRAMUSCULAR | Status: AC
Start: 2022-05-24 — End: 2022-05-24
  Administered 2022-05-24: 10 mg via INTRAMUSCULAR
  Filled 2022-05-24: qty 1

## 2022-05-24 MED ORDER — LIDOCAINE 5 % EX PTCH
1.0000 | MEDICATED_PATCH | Freq: Once | CUTANEOUS | Status: DC
  Administered 2022-05-24: 1 via TRANSDERMAL
  Filled 2022-05-24: qty 1

## 2022-05-24 MED ORDER — ACETAMINOPHEN 325 MG PO TABS
650.0000 mg | ORAL_TABLET | Freq: Once | ORAL | Status: AC
  Administered 2022-05-24: 650 mg via ORAL
  Filled 2022-05-24: qty 2

## 2022-05-24 NOTE — Telephone Encounter (Signed)
Called in on patient behalf in regard to hip pain. States that patient is  having bad hip pains and is afraid to walk due to falling.  Can get shot from [place] office wants a call back in regard to see if patient should do so.

## 2022-05-24 NOTE — ED Provider Notes (Signed)
[facility] Provider Note   CSN: [id number] Arrival date & time: 05/24/22  1320     History  Chief Complaint  Patient presents with   Hip Pain    Craig Fields is a 81 y.o. male who presents to the ED with concerns for right hip pain onset 2 days. Notes that he had a mechanical fall 3 weeks ago without pain following. No meds tried PTA.  Family denies any new fall and notes that today patient began to have issues with walking today. Denies numbness, tingling, weakness, back pain.  The history is provided by the patient, the spouse and a relative. No language interpreter was used.       Home Medications Prior to Admission medications   Medication Sig Start Date End Date Taking? Authorizing Provider  acetaminophen (TYLENOL) 650 MG CR tablet Take 1,900 mg by mouth in the morning and at bedtime.   Yes [provider]  aspirin 81 MG EC tablet Take 81 mg by mouth every evening.   Yes [provider]  Cholecalciferol (VITAMIN D) 2000 units CAPS Take 1 capsule by mouth 2 (two) times daily.   Yes [provider]  clopidogrel (PLAVIX) 75 MG tablet TAKE 1 TABLET BY MOUTH EVERY DAY 03/10/22  Yes Fayrene Helper, MD  Coenzyme Q10 (CO Q-10) 200 MG CAPS Take 1 tablet by mouth daily.    Yes [provider]  KLOR-CON M20 20 MEQ tablet TAKE 2 TABLETS BY MOUTH DAILY 03/10/22  Yes Fayrene Helper, MD  lisinopril (ZESTRIL) 10 MG tablet TAKE 1 TABLET BY MOUTH EVERY DAY 04/30/22  Yes Fayrene Helper, MD  metoprolol tartrate (LOPRESSOR) 50 MG tablet TAKE HALF A TABLET BY MOUTH 2 (TWO) TIMES DAILY. 03/10/22  Yes Fayrene Helper, MD  Multiple Vitamins-Minerals ([employer] SPECTRAVITE) TABS Take 1 tablet by mouth every evening.   Yes [provider]  oxyCODONE-acetaminophen (PERCOCET/ROXICET) 5-325 MG tablet Take 1 tablet by mouth every 8 (eight) hours as needed for severe pain. 05/24/22  Yes Sri Clegg A, PA-C  polyethylene glycol  powder (GLYCOLAX/MIRALAX) powder MIX TO TAKE 17 G DAILY AS DIRECTED Patient taking differently: Take 17 g by mouth every morning. 11/26/16  Yes Fayrene Helper, MD  simvastatin (ZOCOR) 20 MG tablet TAKE 1 TABLET BY MOUTH EVERYDAY AT BEDTIME Patient taking differently: Take 20 mg by mouth daily. 11/20/21  Yes Fayrene Helper, MD  triamterene-hydrochlorothiazide (DSKAJGO-11) 37.5-25 MG tablet TAKE 1 TABLET BY MOUTH EVERY DAY IN THE MORNING Patient taking differently: Take 1 tablet by mouth daily. 05/19/22  Yes Fayrene Helper, MD  UNABLE TO FIND Commode assist chair 01/02/19   Fayrene Helper, MD  UNABLE TO FIND Bilateral new shoes  Left AFO with calliber plate  Dx X72.620 3/55/97   Perlie Mayo, NP  UNABLE TO FIND Right custom torch walker x 1  Dx C16.384 02/04/20   Perlie Mayo, NP      Allergies    Bee venom and Niacin    Review of Systems   Review of Systems  Musculoskeletal:  Negative for back pain.  Neurological:  Negative for weakness and numbness.       -tingling  All other systems reviewed and are negative.   Physical Exam Updated Vital Signs BP 138/60 (BP Location: Right Arm)   Pulse 61   Temp (!) 97.4 F (36.3 C) (Oral)   Resp 18   Ht '5\' 9"'$  (1.753 m)   Wt  95.3 kg   SpO2 93%   BMI 31.01 kg/m  Physical Exam Vitals and nursing note reviewed. Exam conducted with a chaperone present.  Constitutional:      General: He is not in acute distress.    Appearance: Normal appearance.  Eyes:     General: No scleral icterus.    Extraocular Movements: Extraocular movements intact.  Cardiovascular:     Rate and Rhythm: Normal rate.  Pulmonary:     Effort: Pulmonary effort is normal. No respiratory distress.  Genitourinary:    Comments: RN chaperone present for GU exam. No TTP noted to right inguinal region. No overlying skin changes.  Musculoskeletal:     Cervical back: Neck supple.     Comments: Mild tenderness to palpation to anterior, lateral right hip.  No obvious deformity, effusion, erythema, or swelling.  Full active range of motion of RLE.  No tenderness to palpation to C, T, L, S spine. Strength and sensation intact to RLE.   Skin:    General: Skin is warm and dry.     Findings: No bruising, erythema or rash.  Neurological:     Mental Status: He is alert.  Psychiatric:        Behavior: Behavior normal.    ED Results / Procedures / Treatments   Labs (all labs ordered are listed, but only abnormal results are displayed) Labs Reviewed - No data to display  EKG None  Radiology CT Hip Right Wo Contrast  Result Date: 05/24/2022 CLINICAL DATA:  Right hip pain after fall 3 weeks ago EXAM: CT OF THE RIGHT HIP WITHOUT CONTRAST TECHNIQUE: Multidetector CT imaging of the right hip was performed according to the standard protocol. Multiplanar CT image reconstructions were also generated. RADIATION DOSE REDUCTION: This exam was performed according to the departmental dose-optimization program which includes automated exposure control, adjustment of the mA and/or kV according to patient size and/or use of iterative reconstruction technique. COMPARISON:  Radiographs 05/24/2022 FINDINGS: Bones/Joint/Cartilage Bridging spurring of the right SI joint. 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 Moderate spurring of the right femoral head and acetabulum. Chondrocalcinosis of the pubic symphysis. No well-defined cortical discontinuity characteristic of hip fracture. No hip joint effusion. Ligaments Suboptimally assessed by CT. Muscles and Tendons Calcifications in the proximal hamstring tendons, and in the adductor longus tendons proximally. Soft tissues Direct right inguinal hernia contains adipose tissue and part of the urinary bladder. Right iliac artery atherosclerotic calcification. There is evidence of notable prostatomegaly. IMPRESSION: 1. Chondrocalcinosis and tendon calcifications, cannot exclude CPPD arthropathy. 2. No definite fracture.  No hip  joint effusion. 3. Direct right inguinal hernia contains adipose tissue and part of the urinary bladder. 4. Prostatomegaly. 5. Bridging spurring of the right SI joint. 6. Atherosclerosis. Electronically Signed   By: Van Clines M.D.   On: 05/24/2022 17:32   CT Lumbar Spine Wo Contrast  Result Date: 05/24/2022 CLINICAL DATA:  Back trauma, no prior imaging (Age >= 16y) lumbar radiographs EXAM: CT LUMBAR SPINE WITHOUT CONTRAST TECHNIQUE: Multidetector CT imaging of the lumbar spine was performed without intravenous contrast administration. Multiplanar CT image reconstructions were also generated. RADIATION DOSE REDUCTION: This exam was performed according to the departmental dose-optimization program which includes automated exposure control, adjustment of the mA and/or kV according to patient size and/or use of iterative reconstruction technique. COMPARISON:  November 25, 2016.  CT abdomen/pelvis 05/11/2018. FINDINGS: Segmentation: 5 lumbar type vertebral bodies. Alignment: Slight anterolisthesis of L5 on S1, similar to the prior. Vertebrae: Vertebral body heights  are unchanged in comparison to the prior. No evidence of acute fracture. Suspected remote/healed fracture involving the right L1 transverse process. Osteopenia. Paraspinal and other soft tissues: Partially imaged left basilar pleural thickening with pleural calcifications and possible overlying rounded atelectasis. Findings appears similar to 2018 CT chest. Disc levels: Severe lower lumbar facet arthropathy. Multilevel degenerative change including vacuum disc phenomenon and endplate spurring with disc bulging. IMPRESSION: 1. No evidence of acute fracture or traumatic malalignment. 2. 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 3. Osteopenia. 4. Partially imaged left basilar pleural thickening with pleural calcifications and possible overlying rounded atelectasis. Findings could potentially relate to prior  infection and/or asbestosis and appear similar to 2018 CT chest. Electronically Signed   By: Margaretha Sheffield M.D.   On: 05/24/2022 17:31   DG Hip Unilat W or Wo Pelvis 2-3 Views Right  Result Date: 05/24/2022 CLINICAL DATA:  Trauma, fall, pain EXAM: DG HIP (WITH OR WITHOUT PELVIS) 2-3V RIGHT COMPARISON:  11/25/2016 FINDINGS: No recent fracture or dislocation is seen. Degenerative changes are noted with bony spurs. There are linear coarse calcifications in the soft tissues inferior to the ischial tuberosity, possibly calcific myositis or tendinosis. Degenerative changes are noted in visualized lower lumbar spine. IMPRESSION: No recent fracture or dislocation is seen in right hip. Degenerative changes are noted. Electronically Signed   By: Elmer Picker M.D.   On: 05/24/2022 14:45    Procedures Procedures    Medications Ordered in ED Medications  lidocaine (LIDODERM) 5 % 1 patch (1 patch Transdermal Patch Applied 05/24/22 1608)  dexamethasone (DECADRON) injection 10 mg (10 mg Intramuscular Given 05/24/22 1608)  acetaminophen (TYLENOL) tablet 650 mg (650 mg Oral Given 05/24/22 1609)    ED Course/ Medical Decision Making/ A&P Clinical Course as of 05/24/22 2252  Mon May 24, 2022  1700 Discussed with family plans for prescription narcotic to be sent home.  Family agrees at this time. [SB]  1747 Pt re-evaluated and discussed with patient CT findings.  RN chaperone present for GU exam.  Discussed with patient discharge treatment plan.  Answered all available questions.  Patient appears safe for discharge at this time. [SB]  1903 Discussed with patient and family plans for discharge.  Family and patient agreeable with discharge with [facility].  Will provide patient with pain medications while awaiting transport home. [SB]  1907 This is an 81 year old male presents the ED complaining of right hip pain.  He fell about 2 weeks ago was able to walk at that time but began having worsening sharp pain  exactly in his right hip and right lower back near the iliac crest a few days ago.  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  He does not have radiculopathy.  His strength and neurological testing is intact on exam.  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  Patient given medications for pain control, anticipate that he would be able to follow-up as an outpatient, but we are attempting to assess his overall mobility in terms of getting him home.  He has weakness to grip in his left hand [MT]    Clinical Course User Index [MT] Trifan, Carola Rhine, MD [SB] Chloeanne Poteet A, PA-C  Medical Decision Making Amount and/or Complexity of Data Reviewed Radiology: ordered.  Risk OTC drugs. Prescription drug management.   Pt presents with right hip pain onset 2 days. Pt had a mechanical fall 3 weeks ago without pain noted to the hip following.  Denies hitting his head or passing out.  No anticoagulants at this time.  Brought in today due to patient having pain with ambulating.  No numbness, tingling, weakness, back pain.  Patient afebrile.  On exam patient with mild tenderness to palpation to anterior, lateral right hip. No obvious deformity, effusion, erythema, or swelling.  Full active range of motion of RLE.  No tenderness to palpation to C, T, L, S spine. Strength and sensation intact to RLE.  RN chaperone present for GU exam. No TTP noted to right inguinal region. No overlying skin changes. Differential diagnosis includes inguinal hernia, fracture, dislocation, osteoarthritis, septic arthritis.   Additional history obtained:  Additional history obtained from Spouse/Significant Other and daughter  Imaging: I ordered imaging studies including right hip xray, CT right hip I independently visualized and interpreted  imaging which showed: No acute fracture noted on hip x-ray.  CT of right hip does not show fracture.  Shows concern for right inguinal hernia with adipose and urinary bladder noted.  This is similar to CT abdomen pelvis noted in 2019. I agree with the radiologist interpretation  Medications:  I ordered medication including Zofran, Tylenol, Lidoderm patch, Morphine, and Decadron for symptom management Reevaluation of the patient after these medicines and interventions, I reevaluated the patient and found that they have improved I have reviewed the patients home medicines and have made adjustments as needed   Disposition: Presentation suspicious for right hip pain in the setting of recent fall.  Also concerning for right inguinal hernia, doubt incarcerated or strangulated hernia at this time.  Do not septic arthritis at this time, able to flex and extend right foot without difficulty.  No erythema or increased warmth noted to right hip.  After consideration of the diagnostic results and the patients response to treatment, I feel that the patient would benefit from Discharge home. Case discussed with attending who evaluated patient and agrees with discharge treatment plan. PDMP reviewed, pt sent with a short course of percocet. Instructed patient to maintain follow up with Supportive care measures and strict return precautions discussed with patient at bedside. Pt acknowledges and verbalizes understanding. Pt appears safe for discharge. Follow up as indicated in discharge paperwork.    This chart was dictated using voice recognition software, Dragon. Despite the best efforts of this provider to proofread and correct errors, errors may still occur which can change documentation meaning.   Final Clinical Impression(s) / ED Diagnoses Final diagnoses:  Right hip pain  Right inguinal hernia    Rx / DC Orders ED Discharge Orders          Ordered    oxyCODONE-acetaminophen (PERCOCET/ROXICET) 5-325 MG  tablet  Every 8 hours PRN        05/24/22 1819              Briel Gallicchio A, PA-C 05/24/22 2253    Wyvonnia Dusky, MD 05/25/22 0930

## 2022-05-24 NOTE — ED Triage Notes (Signed)
Pt is brought in by [facility] for c/o right hip pain; pt states he fell x 3 weeks ago but has been having increased pain;

## 2022-05-24 NOTE — ED Notes (Signed)
[facility] notified of patient needing transportation back home. 

## 2022-05-24 NOTE — Telephone Encounter (Signed)
FYI, Patient spouse called our office at 12:20 pm while at lunch to let provider know they called EMS to pick up patient, patient fell 3 days ago and been complaining of hip pain. The ambulance took him to the ER.

## 2022-05-24 NOTE — Discharge Instructions (Addendum)
It was a pleasure taking care of you today!   Your xray and CT didn't show any fractures of your right hip. Your CT scan did show a hernia that has been present on previous CT scans. You may call your surgeon, Dr. Arnoldo Morale to set up a follow up appointment regarding todays ED visit. You will be sent a short course of percocet, take as directed.  You may call your orthopedist to set up a follow-up appointment regarding today's ED visit.  Call your primary care provider regarding todays ED visit. Return to the ED if you are experiencing increasing/worsening hip pain, inability to walk, fever, difficulty urinating, or worsening symptoms.

## 2022-05-25 NOTE — Telephone Encounter (Signed)
Wants to know if you think its a good idea to get an injection in hip from dr Luna Glasgow

## 2022-05-25 NOTE — Telephone Encounter (Signed)
Spoke with wife and sent message to ortho to get them to call him

## 2022-05-27 ENCOUNTER — Telehealth: Payer: Self-pay

## 2022-05-27 ENCOUNTER — Ambulatory Visit: Payer: Medicare Other | Admitting: Orthopaedic Surgery

## 2022-05-27 NOTE — Telephone Encounter (Signed)
Patient spouse called question regarding some medication that was given not sure whether or not to take this medicine. Call back # [phone]

## 2022-05-27 NOTE — Telephone Encounter (Signed)
The ER gave him a few oxycodones to last until he seen ortho. He was scheduled for today but they had to cancel due to transportation and reschedule until the 20th. He only has 2 pills left. Wife wants to know if he can have a few more to get him through until he can get the shot with ortho to get him through the weekend. Pls advise

## 2022-05-28 ENCOUNTER — Other Ambulatory Visit: Payer: Self-pay | Admitting: Family Medicine

## 2022-05-28 MED ORDER — OXYCODONE-ACETAMINOPHEN 5-325 MG PO TABS: ORAL_TABLET | ORAL | 0 refills | Status: DC

## 2022-06-02 ENCOUNTER — Encounter: Payer: Self-pay | Admitting: Orthopaedic Surgery

## 2022-06-02 ENCOUNTER — Ambulatory Visit (INDEPENDENT_AMBULATORY_CARE_PROVIDER_SITE_OTHER): Payer: Medicare Other | Admitting: Orthopaedic Surgery

## 2022-06-02 DIAGNOSIS — G8194 Hemiplegia, unspecified affecting left nondominant side: Secondary | ICD-10-CM | POA: Diagnosis not present

## 2022-06-02 DIAGNOSIS — M545 Low back pain, unspecified: Secondary | ICD-10-CM

## 2022-06-02 DIAGNOSIS — I2510 Atherosclerotic heart disease of native coronary artery without angina pectoris: Secondary | ICD-10-CM | POA: Diagnosis not present

## 2022-06-02 DIAGNOSIS — M25551 Pain in right hip: Secondary | ICD-10-CM

## 2022-06-02 DIAGNOSIS — G8929 Other chronic pain: Secondary | ICD-10-CM | POA: Diagnosis not present

## 2022-06-02 NOTE — Progress Notes (Signed)
Subjective:    Patient ID: Craig Fields, male    DOB: 12-31-1940, 81 y.o.   MRN: [id number]  HPI He has had pain in the lower back and right hip.  He had an acute episode after a recent fall.  He went to the ER on 05-25-22.  He was evaluated and had X-rays of the hip, CT of the hip and CT of the back.  I have reviewed the findings.  He did not have a fracture.  He has had a point of pain and is here for further evaluation.  I have independently reviewed and interpreted x-rays of this patient done at another site by another physician or qualified health professional.  He is in a wheelchair and has a cane and a brace on the left lower leg.  His wife, daughter and granddaughters accompany him.  He says his pain is less over the last few days.     Review of Systems  Constitutional:  Positive for activity change.  Musculoskeletal:  Positive for arthralgias, back pain, gait problem and myalgias.  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  All other systems reviewed and are negative. For Review of Systems, all other systems reviewed and are negative.  The following is a summary of the past history medically, past history surgically, known current medicines, social history and family history.  This information is gathered electronically by the computer from prior information and documentation.  I review this each visit and have found including this information at this point in the chart is beneficial and informative.   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    Essential hypertension    Hematuria 04/30/2019   New complaint, does nott have pain of kidney stones, needs to submit UA for testing asap and needs urology eval   Hyperlipidemia    IGT (impaired glucose  tolerance)    Obesity    OSA (obstructive sleep apnea)    Seizures ([place])    Age 67 or 3, no meds and no reoccurances   Skin lesion 12/07/2012   Skin tag 11/16/2011   Stroke [facility])     Past Surgical History:  Procedure Laterality Date   COLONOSCOPY N/A 07/15/2015   Procedure: COLONOSCOPY;  Surgeon: Aviva Signs, MD;  Location[facility];  Service: Gastroenterology;  Laterality: N/A;   CORONARY ARTERY BYPASS GRAFT  2004   x4   INCISIONAL HERNIA REPAIR N/A 01/01/2013   Procedure: HERNIA REPAIR INCISIONAL;  Surgeon: Jamesetta So, MD;  Location: [facility] ORS;  Service: General;  Laterality: N/A;  umbilical area   INSERTION OF MESH N/A 01/01/2013   Procedure: INSERTION OF MESH;  Surgeon: Jamesetta So, MD;  Location: [facility] ORS;  Service: General;  Laterality: N/A;   Left inguinal  hernia repair  2005   [name]  2007    Current Outpatient Medications on File Prior to Visit  Medication Sig Dispense Refill   acetaminophen (TYLENOL) 650 MG CR tablet Take 1,900 mg by mouth in the morning and at bedtime.     aspirin 81 MG EC tablet Take 81 mg by mouth every evening.     Cholecalciferol (VITAMIN D) 2000 units CAPS Take 1  capsule by mouth 2 (two) times daily.     clopidogrel (PLAVIX) 75 MG tablet TAKE 1 TABLET BY MOUTH EVERY DAY 90 tablet 1   Coenzyme Q10 (CO Q-10) 200 MG CAPS Take 1 tablet by mouth daily.      KLOR-CON M20 20 MEQ tablet TAKE 2 TABLETS BY MOUTH DAILY 180 tablet 1   lisinopril (ZESTRIL) 10 MG tablet TAKE 1 TABLET BY MOUTH EVERY DAY 90 tablet 1   metoprolol tartrate (LOPRESSOR) 50 MG tablet TAKE HALF A TABLET BY MOUTH 2 (TWO) TIMES DAILY. 90 tablet 1   Multiple Vitamins-Minerals ([employer]S SPECTRAVITE) TABS Take 1 tablet by mouth every evening.     oxyCODONE-acetaminophen (PERCOCET/ROXICET) 5-325 MG tablet Take 1 tablet by mouth every 8 (eight) hours as needed for severe pain. 6 tablet 0   oxyCODONE-acetaminophen (PERCOCET/ROXICET) 5-325 MG tablet Take one tablet by mouth two times  daily, as needed, for severe pain 12 tablet 0   polyethylene glycol powder (GLYCOLAX/MIRALAX) powder MIX TO TAKE 17 G DAILY AS DIRECTED (Patient taking differently: Take 17 g by mouth every morning.) 527 g 1   simvastatin (ZOCOR) 20 MG tablet TAKE 1 TABLET BY MOUTH EVERYDAY AT BEDTIME (Patient taking differently: Take 20 mg by mouth daily.) 90 tablet 3   triamterene-hydrochlorothiazide (MAXZIDE-25) 37.5-25 MG tablet TAKE 1 TABLET BY MOUTH EVERY DAY IN THE MORNING (Patient taking differently: Take 1 tablet by mouth daily.) 90 tablet 1   UNABLE TO FIND Commode assist chair 1 each 0   UNABLE TO FIND Bilateral new shoes  Left AFO with calliber plate  Dx J88.416 1 each PRN   UNABLE TO FIND Right custom torch walker x 1  Dx S06.301 1 each 0   No current facility-administered medications on file prior to visit.    Social History   Socioeconomic History   Marital status: Married    Spouse name: Jolayne Haines   Number of children: 2   Years of education: Not on file   Highest education level: 12th grade  Occupational History   Occupation: retired   Tobacco Use   Smoking status: Former    Packs/day: 0.50    Years: 5.00    Total pack years: 2.50    Types: Cigarettes    Quit date: 12/27/1980    Years since quitting: 41.4   Smokeless tobacco: Former    Types: Chew    Quit date: 01/24/2013  Vaping Use   Vaping Use: Never used  Substance and Sexual Activity   Alcohol use: No    Alcohol/week: 0.0 standard drinks of alcohol   Drug use: No   Sexual activity: Not Currently    Birth control/protection: None  Other Topics Concern   Not on file  Social History Narrative   Not on file   Social Determinants of Health   Financial Resource Strain: Low Risk  (06/24/2021)   Overall Financial Resource Strain (CARDIA)    Difficulty of Paying Living Expenses: Not hard at all  Food Insecurity: No Food Insecurity (11/24/2021)   Hunger Vital Sign    Worried About Running Out of Food in the Last Year:  Never true    [place] in the Last Year: Never true  Transportation Needs: No Transportation Needs (11/24/2021)   PRAPARE - Hydrologist (Medical): No    Lack of Transportation (Non-Medical): No  Physical Activity: Insufficiently Active (06/24/2021)   Exercise Vital Sign    Days of Exercise per Week: 3 days  Minutes of Exercise per Session: 10 min  Stress: No Stress Concern Present (06/24/2021)   [place]    Feeling of Stress : Not at all  Social Connections: [place] (06/24/2021)   Social Connection and Isolation Panel [NHANES]    Frequency of Communication with Friends and Family: More than three times a week    Frequency of Social Gatherings with Friends and Family: More than three times a week    Attends Religious Services: 1 to 4 times per year    Active Member of [employer] or Organizations: Yes    Attends Archivist Meetings: 1 to 4 times per year    Marital Status: Married  Human resources officer Violence: Not At Risk (06/24/2021)   Humiliation, Afraid, Rape, and Kick questionnaire    Fear of Current or Ex-Partner: No    Emotionally Abused: No    Physically Abused: No    Sexually Abused: No    Family History  Problem Relation Age of Onset   Pancreatic cancer Mother    Heart disease Father    Prostate cancer Father    Stroke Brother    Heart disease Brother    Heart disease Brother    COPD Sister    Actinic keratosis Sister    Obesity Son    Anxiety disorder Son     There were no vitals taken for this visit.  There is no height or weight on file to calculate BMI.      Objective:   Physical Exam Vitals and nursing note reviewed. Exam conducted with a chaperone present.  Constitutional:      Appearance: He is well-developed.  HENT:     Head: Normocephalic and atraumatic.  Eyes:     Conjunctiva/sclera: Conjunctivae normal.     Pupils: Pupils  are equal, round, and reactive to light.  Cardiovascular:     Rate and Rhythm: Normal rate and regular rhythm.  Pulmonary:     Effort: Pulmonary effort is normal.  Abdominal:     Palpations: Abdomen is soft.  Musculoskeletal:       Arms:     Cervical back: Normal range of motion and neck supple.  Skin:    General: Skin is warm and dry.  Neurological:     Mental Status: He is alert and oriented to person, place, and time.     Cranial Nerves: No cranial nerve deficit.     Motor: No abnormal muscle tone.     Coordination: Coordination abnormal.     Deep Tendon Reflexes: Reflexes abnormal.     Comments: Left sided hemiparesis  Psychiatric:        Behavior: Behavior normal.        Thought Content: Thought content normal.        Judgment: Judgment normal.           Assessment & Plan:   Encounter Diagnoses  Name Primary?   Lumbar pain Yes   Chronic pain of right hip    Left hemiparesis (HCC)    I was unable to find a trigger point of the lower back. He has no spasm, no redness.  I was going to inject but there is no trigger point.  I have recommended Salonpas patches and a TENS unit.  I have given Rx for the TENS unit and explained how to use it.  I can see in the office if the family can find the trigger point and mark it  with a magic marker.  Return prn.  Call if any problem.  Precautions discussed.  Electronically Signed Sanjuana Kava, MD 9/20/202310:20 AM

## 2022-06-28 ENCOUNTER — Ambulatory Visit (INDEPENDENT_AMBULATORY_CARE_PROVIDER_SITE_OTHER): Payer: Medicare Other

## 2022-06-28 DIAGNOSIS — Z0000 Encounter for general adult medical examination without abnormal findings: Secondary | ICD-10-CM | POA: Diagnosis not present

## 2022-06-28 NOTE — Patient Instructions (Signed)
  [name] , Thank you for taking time to come for your Medicare Wellness Visit. I appreciate your ongoing commitment to your health goals. Please review the following plan we discussed and let me know if I can assist you in the future.   These are the goals we discussed:  Goals      Exercise 3x per week (30 min per time)     Recommend starting a routine exercise program at least 3 days a week for 30-45 minutes at a time as tolerated.      Patient Stated     Get around better.        This is a list of the screening recommended for you and due dates:  Health Maintenance  Topic Date Due   Zoster (Shingles) Vaccine (1 of 2) Never done   Colon Cancer Screening  07/14/2020   COVID-19 Vaccine (4 - Moderna series) 09/06/2020   Flu Shot  04/13/2022   Tetanus Vaccine  08/15/2024   Pneumonia Vaccine  Completed   HPV Vaccine  Aged Out

## 2022-06-28 NOTE — Progress Notes (Signed)
Subjective:   Siraj Dermody Dubreuil is a 81 y.o. male who presents for Medicare Annual/Subsequent preventive examination.  Review of Systems    I connected with  Amahri Dengel Jolly on 06/28/22 by a audio enabled telemedicine application and verified that I am speaking with the correct person using two identifiers.  Patient Location: Home  Provider Location: Office/Clinic  I discussed the limitations of evaluation and management by telemedicine. The patient expressed understanding and agreed to proceed.        Objective:    There were no vitals filed for this visit. There is no height or weight on file to calculate BMI.     05/24/2022    2:12 PM 11/24/2021    9:23 AM 06/24/2021   10:39 AM 11/11/2020    5:35 PM 06/14/2018    1:17 PM 05/11/2018    6:01 PM 06/13/2017   10:26 AM  Advanced Directives  Does Patient Have a Medical Advance Directive? No No No No No No No  Would patient like information on creating a medical advance directive?  No - Patient declined No - Patient declined  Yes (ED - Information included in AVS)  Yes (MAU/Ambulatory/Procedural Areas - Information given)    Current Medications (verified) Outpatient Encounter Medications as of 06/28/2022  Medication Sig   acetaminophen (TYLENOL) 650 MG CR tablet Take 1,900 mg by mouth in the morning and at bedtime.   aspirin 81 MG EC tablet Take 81 mg by mouth every evening.   Cholecalciferol (VITAMIN D) 2000 units CAPS Take 1 capsule by mouth 2 (two) times daily.   clopidogrel (PLAVIX) 75 MG tablet TAKE 1 TABLET BY MOUTH EVERY DAY   Coenzyme Q10 (CO Q-10) 200 MG CAPS Take 1 tablet by mouth daily.    KLOR-CON M20 20 MEQ tablet TAKE 2 TABLETS BY MOUTH DAILY   lisinopril (ZESTRIL) 10 MG tablet TAKE 1 TABLET BY MOUTH EVERY DAY   metoprolol tartrate (LOPRESSOR) 50 MG tablet TAKE HALF A TABLET BY MOUTH 2 (TWO) TIMES DAILY.   Multiple Vitamins-Minerals (CVS SPECTRAVITE) TABS Take 1 tablet by mouth every evening.   oxyCODONE-acetaminophen  (PERCOCET/ROXICET) 5-325 MG tablet Take 1 tablet by mouth every 8 (eight) hours as needed for severe pain.   oxyCODONE-acetaminophen (PERCOCET/ROXICET) 5-325 MG tablet Take one tablet by mouth two times daily, as needed, for severe pain   polyethylene glycol powder (GLYCOLAX/MIRALAX) powder MIX TO TAKE 17 G DAILY AS DIRECTED (Patient taking differently: Take 17 g by mouth every morning.)   simvastatin (ZOCOR) 20 MG tablet TAKE 1 TABLET BY MOUTH EVERYDAY AT BEDTIME (Patient taking differently: Take 20 mg by mouth daily.)   triamterene-hydrochlorothiazide (MAXZIDE-25) 37.5-25 MG tablet TAKE 1 TABLET BY MOUTH EVERY DAY IN THE MORNING (Patient taking differently: Take 1 tablet by mouth daily.)   UNABLE TO FIND Commode assist chair   UNABLE TO FIND Bilateral new shoes  Left AFO with calliber plate  Dx W65.681   UNABLE TO FIND Right custom torch walker x 1  Dx E75.170   No facility-administered encounter medications on file as of 06/28/2022.    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    Essential hypertension  Hematuria 04/30/2019   New complaint, does nott have pain of kidney stones, needs to submit UA for testing asap and needs urology eval   Hyperlipidemia    IGT (impaired glucose tolerance)    Obesity    OSA (obstructive sleep apnea)    Seizures ([place])    Age 73 or 3, no meds and no reoccurances   Skin lesion 12/07/2012   Skin tag 11/16/2011   Stroke [facility])    Past Surgical History:  Procedure Laterality Date   COLONOSCOPY N/A 07/15/2015   Procedure: COLONOSCOPY;  Surgeon: Aviva Signs, MD;  Location[facility];  Service: Gastroenterology;  Laterality: N/A;   CORONARY ARTERY BYPASS GRAFT  2004   x4   INCISIONAL HERNIA  REPAIR N/A 01/01/2013   Procedure: HERNIA REPAIR INCISIONAL;  Surgeon: Jamesetta So, MD;  Location: [facility] ORS;  Service: General;  Laterality: N/A;  umbilical area   INSERTION OF MESH N/A 01/01/2013   Procedure: INSERTION OF MESH;  Surgeon: Jamesetta So, MD;  Location: [facility] ORS;  Service: General;  Laterality: N/A;   Left inguinal  hernia repair  2005   [name]  2007   Family History  Problem Relation Age of Onset   Pancreatic cancer Mother    Heart disease Father    Prostate cancer Father    Stroke Brother    Heart disease Brother    Heart disease Brother    COPD Sister    Actinic keratosis Sister    Obesity Son    Anxiety disorder Son    Social History   Socioeconomic History   Marital status: Married    Spouse name: Jolayne Haines   Number of children: 2   Years of education: Not on file   Highest education level: 12th grade  Occupational History   Occupation: retired   Tobacco Use   Smoking status: Former    Packs/day: 0.50    Years: 5.00    Total pack years: 2.50    Types: Cigarettes    Quit date: 12/27/1980    Years since quitting: 41.5   Smokeless tobacco: Former    Types: Chew    Quit date: 01/24/2013  Vaping Use   Vaping Use: Never used  Substance and Sexual Activity   Alcohol use: No    Alcohol/week: 0.0 standard drinks of alcohol   Drug use: No   Sexual activity: Not Currently    Birth control/protection: None  Other Topics Concern   Not on file  Social History Narrative   Not on file   Social Determinants of Health   Financial Resource Strain: Low Risk  (06/24/2021)   Overall Financial Resource Strain (CARDIA)    Difficulty of Paying Living Expenses: Not hard at all  Food Insecurity: No Food Insecurity (11/24/2021)   Hunger Vital Sign    Worried About Running Out of Food in the Last Year: Never true    [place] in the Last Year: Never true  Transportation Needs: No Transportation Needs (11/24/2021)   PRAPARE - Radiographer, therapeutic (Medical): No    Lack of Transportation (Non-Medical): No  Physical Activity: Insufficiently Active (06/24/2021)   Exercise Vital Sign    Days of Exercise per Week: 3 days    Minutes of Exercise per Session: 10 min  Stress: No Stress Concern Present (06/24/2021)   [place]    Feeling of Stress : Not at all  Social  Connections: Socially Integrated (06/24/2021)   Social Connection and Isolation Panel [NHANES]    Frequency of Communication with Friends and Family: More than three times a week    Frequency of Social Gatherings with Friends and Family: More than three times a week    Attends Religious Services: 1 to 4 times per year    Active Member of Clubs or Organizations: Yes    Attends Archivist Meetings: 1 to 4 times per year    Marital Status: Married    Tobacco Counseling Counseling given: Not Answered   Clinical Intake:                 Diabetic?no         Activities of Daily Living     No data to display           Patient Care Team: Fayrene Helper, MD as PCP - General Domenic Polite Aloha Gell, MD as PCP - Cardiology (Cardiology) Caprice Beaver, DPM as Consulting Physician (Podiatry) Satira Sark, MD as Consulting Physician (Cardiology) Aviva Signs, MD as Consulting Physician (General Surgery)  Indicate any recent Medical Services you may have received from other than [facility] providers in the past year (date may be approximate).     Assessment:   This is a routine wellness examination for Jerardo.  Hearing/Vision screen No results found.  Dietary issues and exercise activities discussed:     Goals Addressed   None   Depression Screen    01/01/2022    9:56 AM 11/24/2021    9:19 AM 06/24/2021   10:35 AM 05/26/2021    1:27 PM 06/20/2020    9:57 AM 06/20/2020    9:52 AM 05/13/2020    3:48 PM  PHQ 2/9 Scores  PHQ - 2 Score 0 0 0 0 0 0 0     Fall Risk    01/01/2022    9:56 AM 11/24/2021    9:19 AM 06/24/2021   10:40 AM 05/26/2021    1:27 PM 06/20/2020    9:57 AM  Fall Risk   Falls in the past year? 0 '1 1 1 1  '$ Number falls in past yr: 0 0 '1 1 1  '$ Injury with Fall? 0 0 0 0 1  Risk for fall due to : No Fall Risks  Impaired balance/gait;Impaired mobility;Impaired vision;History of fall(s) Impaired balance/gait;Impaired mobility;Orthopedic patient   Follow up Falls evaluation completed  Falls prevention discussed Falls evaluation completed     FALL RISK PREVENTION PERTAINING TO THE HOME:  Any stairs in or around the home? Yes  If so, are there any without handrails? No  Home free of loose throw rugs in walkways, pet beds, electrical cords, etc? Yes  Adequate lighting in your home to reduce risk of falls? Yes   ASSISTIVE DEVICES UTILIZED TO PREVENT FALLS:  Life alert? Yes  Use of a cane, walker or w/c? Yes  Grab bars in the bathroom? Yes  Shower chair or bench in shower? Yes  Elevated toilet seat or a handicapped toilet? Yes   Cognitive Function:        06/24/2021   10:44 AM 06/20/2020    9:59 AM 06/20/2019   11:29 AM 06/14/2018    1:20 PM 06/13/2017   10:30 AM  6CIT Screen  What Year? 0 points 0 points 0 points 0 points 0 points  What month? 0 points 0 points 0 points 0 points 0 points  What time? 0 points 3 points 0 points 0  points 0 points  Count back from 20 0 points 0 points 0 points 2 points 0 points  Months in reverse 0 points 0 points 0 points 0 points 0 points  Repeat phrase 2 points 0 points 0 points 0 points 0 points  Total Score 2 points 3 points 0 points 2 points 0 points    Immunizations Immunization History  Administered Date(s) Administered   Fluad Quad(high Dose 65+) 05/15/2019, 06/27/2020, 05/26/2021   H1N1 08/27/2008, 10/16/2008   Influenza Split 07/06/2011, 06/07/2012   Influenza Whole 06/14/2007, 06/24/2009, 06/30/2010   Influenza,inj,Quad PF,6+ Mos 06/18/2013, 06/18/2014, 06/02/2015,  06/10/2016, 06/13/2017, 05/16/2018   Moderna Sars-Covid-2 Vaccination 10/03/2019, 10/24/2019, 07/12/2020   Pneumococcal Conjugate-13 04/10/2014   Pneumococcal Polysaccharide-23 01/04/2007   Td 04/15/2004   Tdap 08/15/2014   Zoster, Live 01/10/2007    TDAP status: Up to date  Flu Vaccine status: Due, Education has been provided regarding the importance of this vaccine. Advised may receive this vaccine at local pharmacy or Health Dept. Aware to provide a copy of the vaccination record if obtained from local pharmacy or Health Dept. Verbalized acceptance and understanding.  Pneumococcal vaccine status: Up to date  Covid-19 vaccine status: Completed vaccines  Qualifies for Shingles Vaccine? Yes   Zostavax completed No   Shingrix Completed?: No.    Education has been provided regarding the importance of this vaccine. Patient has been advised to call insurance company to determine out of pocket expense if they have not yet received this vaccine. Advised may also receive vaccine at local pharmacy or Health Dept. Verbalized acceptance and understanding.  Screening Tests Health Maintenance  Topic Date Due   Zoster Vaccines- Shingrix (1 of 2) Never done   COLONOSCOPY (Pts 45-21yr Insurance coverage will need to be confirmed)  07/14/2020   COVID-19 Vaccine (4 - Moderna series) 09/06/2020   INFLUENZA VACCINE  04/13/2022   TETANUS/TDAP  08/15/2024   Pneumonia Vaccine 81 Years old  Completed   HPV VACCINES  Aged Out    Health Maintenance  Health Maintenance Due  Topic Date Due   Zoster Vaccines- Shingrix (1 of 2) Never done   COLONOSCOPY (Pts 45-460yrInsurance coverage will need to be confirmed)  07/14/2020   COVID-19 Vaccine (4 - Moderna series) 09/06/2020   INFLUENZA VACCINE  04/13/2022    Colorectal cancer screening: No longer required.   Lung Cancer Screening: (Low Dose CT Chest recommended if Age 83-80ears, 30 pack-year currently smoking OR have quit w/in 15years.) does not  qualify.   Lung Cancer Screening Referral: no  Additional Screening:  Hepatitis C Screening: does qualify; Completed   Vision Screening: Recommended annual ophthalmology exams for early detection of glaucoma and other disorders of the eye. Is the patient up to date with their annual eye exam?  No  Who is the provider or what is the name of the office in which the patient attends annual eye exams? N/a If pt is not established with a provider, would they like to be referred to a provider to establish care? No .   Dental Screening: Recommended annual dental exams for proper oral hygiene  Community Resource Referral / Chronic Care Management: CRR required this visit?  No   CCM required this visit?  No      Plan:     I have personally reviewed and noted the following in the patient's chart:   Medical and social history Use of alcohol, tobacco or illicit drugs  Current medications and supplements including opioid prescriptions. Patient  is currently taking opioid prescriptions. Information provided to patient regarding non-opioid alternatives. Patient advised to discuss non-opioid treatment plan with their provider. Functional ability and status Nutritional status Physical activity Advanced directives List of other physicians Hospitalizations, surgeries, and ER visits in previous 12 months Vitals Screenings to include cognitive, depression, and falls Referrals and appointments  In addition, I have reviewed and discussed with patient certain preventive protocols, quality metrics, and best practice recommendations. A written personalized care plan for preventive services as well as general preventive health recommendations were provided to patient.     Quentin Angst, [place]   06/28/2022

## 2022-07-02 ENCOUNTER — Encounter (HOSPITAL_COMMUNITY): Payer: Self-pay

## 2022-07-02 ENCOUNTER — Other Ambulatory Visit: Payer: Self-pay

## 2022-07-02 ENCOUNTER — Emergency Department (HOSPITAL_COMMUNITY)
Admission: EM | Admit: 2022-07-02 | Discharge: 2022-07-02 | Discharge status: Against Medical Advice | Payer: Medicare Other | Attending: Emergency Medicine | Admitting: Emergency Medicine

## 2022-07-02 ENCOUNTER — Ambulatory Visit (INDEPENDENT_AMBULATORY_CARE_PROVIDER_SITE_OTHER): Payer: Medicare Other | Admitting: Family Medicine

## 2022-07-02 ENCOUNTER — Encounter: Payer: Self-pay | Admitting: Family Medicine

## 2022-07-02 VITALS — BP 120/80 | HR 66 | Ht 70.0 in | Wt 230.0 lb

## 2022-07-02 DIAGNOSIS — L989 Disorder of the skin and subcutaneous tissue, unspecified: Secondary | ICD-10-CM | POA: Diagnosis not present

## 2022-07-02 DIAGNOSIS — E669 Obesity, unspecified: Secondary | ICD-10-CM

## 2022-07-02 DIAGNOSIS — Z23 Encounter for immunization: Secondary | ICD-10-CM

## 2022-07-02 DIAGNOSIS — I10 Essential (primary) hypertension: Secondary | ICD-10-CM | POA: Diagnosis not present

## 2022-07-02 DIAGNOSIS — E785 Hyperlipidemia, unspecified: Secondary | ICD-10-CM

## 2022-07-02 DIAGNOSIS — R110 Nausea: Secondary | ICD-10-CM | POA: Diagnosis not present

## 2022-07-02 DIAGNOSIS — K429 Umbilical hernia without obstruction or gangrene: Secondary | ICD-10-CM | POA: Insufficient documentation

## 2022-07-02 DIAGNOSIS — Z5321 Procedure and treatment not carried out due to patient leaving prior to being seen by health care provider: Secondary | ICD-10-CM | POA: Diagnosis not present

## 2022-07-02 DIAGNOSIS — I2510 Atherosclerotic heart disease of native coronary artery without angina pectoris: Secondary | ICD-10-CM | POA: Diagnosis not present

## 2022-07-02 DIAGNOSIS — I959 Hypotension, unspecified: Secondary | ICD-10-CM | POA: Diagnosis not present

## 2022-07-02 DIAGNOSIS — N3090 Cystitis, unspecified without hematuria: Secondary | ICD-10-CM

## 2022-07-02 DIAGNOSIS — R7303 Prediabetes: Secondary | ICD-10-CM

## 2022-07-02 LAB — POCT URINALYSIS DIP (CLINITEK)
Bilirubin, UA: NEGATIVE
Blood, UA: NEGATIVE
Glucose, UA: NEGATIVE mg/dL
Ketones, POC UA: NEGATIVE mg/dL
Nitrite, UA: NEGATIVE
POC PROTEIN,UA: NEGATIVE
Spec Grav, UA: 1.025 (ref 1.010–1.025)
Urobilinogen, UA: 0.2 E.U./dL
pH, UA: 5 (ref 5.0–8.0)

## 2022-07-02 NOTE — Assessment & Plan Note (Signed)
Hyperlipidemia:Low fat diet discussed and encouraged.   Lipid Panel  Lab Results  Component Value Date   CHOL 126 01/01/2022   HDL 41 01/01/2022   LDLCALC 64 01/01/2022   TRIG 118 01/01/2022   CHOLHDL 3.1 01/01/2022   Updated lab needed at/ before next visit.

## 2022-07-02 NOTE — ED Triage Notes (Signed)
Pt arrived via [facility] from home c/o umbilical hernia. Pt was seen by PCP today and reports he did not mention his hernia to his PCP nor did he c/o of pain earlier when speaking with his PCP. No hernia in Pts Hx but Pt reports he has had this hernia for "quite some time."

## 2022-07-02 NOTE — Assessment & Plan Note (Signed)
Controlled, no change in medication  

## 2022-07-02 NOTE — Progress Notes (Signed)
Craig Fields     MRN: [id number]      DOB: [birth year]   HPI Craig Fields is here for follow up and re-evaluation of chronic medical conditions, medication management and review of any available recent lab and radiology data.  Preventive health is updated, specifically   Immunization.   Questions or concerns regarding consultations or procedures which the PT has had in the interim are  addressed.Recovering from recent fall The PT denies any adverse reactions to current medications since the last visit.  There are no new concerns.  There are no specific complaints   ROS Denies recent fever or chills. Denies sinus pressure, nasal congestion, ear pain or sore throat. Denies chest congestion, productive cough or wheezing. Denies chest pains, palpitations and leg swelling Denies abdominal pain, nausea, vomiting,diarrhea or constipation.   C/o frequency Chronic  joint pain,  and limitation in mobility. Denies headaches, seizures,  Denies depression, anxiety or insomnia. Denies skin break down or rash.   PE  BP 120/80   Pulse 66   Ht '5\' 10"'$  (1.778 m)   Wt 230 lb (104.3 kg)   SpO2 97%   BMI 33.00 kg/m   Patient alert and oriented and in no cardiopulmonary distress.  HEENT: No facial asymmetry, EOMI,     Neck decreased ROM .  Chest: Clear to auscultation bilaterally.  CVS: S1, S2 systolic  murmur, no S3.Regular rate.  ABD: Soft non tender.   Ext: No edema  MS: decreased  ROM spine, shoulders, hips and knees.  Skin: Intact, no ulcerations or rash noted.crusty hyperpigmented lesion on right jaw  Psych: Good eye contact, normal affect. Memory intact not anxious or depressed appearing.  CNS: CN 2-12 intact, left hemiplegia   Assessment & Plan  Essential hypertension, benign Controlled, no change in medication   Hyperlipidemia LDL goal <70 Hyperlipidemia:Low fat diet discussed and encouraged.   Lipid Panel  Lab Results  Component Value Date   CHOL 126 01/01/2022    HDL 41 01/01/2022   LDLCALC 64 01/01/2022   TRIG 118 01/01/2022   CHOLHDL 3.1 01/01/2022   Updated lab needed at/ before next visit.     Prediabetes Patient educated about the importance of limiting  Carbohydrate intake , the need to commit to daily physical activity for a minimum of 30 minutes , and to commit weight loss. The fact that changes in all these areas will reduce or eliminate all together the development of diabetes is stressed.  Updated lab needed at/ before next visit.     Latest Ref Rng & Units 01/01/2022   10:54 AM 09/09/2021    1:41 PM 05/25/2021    9:44 AM 10/14/2020   10:01 AM 01/01/2020    4:20 PM  Diabetic Labs  HbA1c <5.7 % of total Hgb     5.8   Chol 100 - 199 mg/dL 126   132  159  141   HDL >39 mg/dL 41   45  48  40   Calc LDL 0 - 99 mg/dL 64   71  84  78   Triglycerides 0 - 149 mg/dL 118   85  158  135   Creatinine 0.76 - 1.27 mg/dL 1.03  0.95  0.87  1.12  0.92       07/02/2022    7:06 PM 07/02/2022    9:21 AM 07/02/2022    8:55 AM 07/02/2022    8:54 AM 05/24/2022    4:30 PM 05/24/2022  2:13 PM 05/24/2022    2:11 PM  BP/Weight  Systolic BP 160 109 323 557 322 025   Diastolic BP 41 80 68 68 75 60   Wt. (Lbs) 223   230   210  BMI 32 kg/m2   33 kg/m2   31.01 kg/m2      01/08/2020    1:00 PM  Foot/eye exam completion dates  Foot Form Completion Done      Obesity (BMI 30.0-34.9)  Patient re-educated about  the importance of commitment to a  minimum of 150 minutes of exercise per week as able.  The importance of healthy food choices with portion control discussed, as well as eating regularly and within a 12 hour window most days. The need to choose "clean , green" food 50 to 75% of the time is discussed, as well as to make water the primary drink and set a goal of 64 ounces water daily.       07/02/2022    7:06 PM 07/02/2022    8:54 AM 05/24/2022    2:11 PM  Weight /BMI  Weight 223 lb 230 lb 210 lb  Height '5\' 10"'$  (1.778 m) '5\' 10"'$   (1.778 m) '5\' 9"'$  (1.753 m)  BMI 32 kg/m2 33 kg/m2 31.01 kg/m2      Cystitis CCUA abn , send for c/S  Skin lesion of face Hyperpigmented, irregular lesionon rigth cheek, pt and grandson elect to continue to watch this

## 2022-07-02 NOTE — Assessment & Plan Note (Signed)
Patient educated about the importance of limiting  Carbohydrate intake , the need to commit to daily physical activity for a minimum of 30 minutes , and to commit weight loss. The fact that changes in all these areas will reduce or eliminate all together the development of diabetes is stressed.  Updated lab needed at/ before next visit.     Latest Ref Rng & Units 01/01/2022   10:54 AM 09/09/2021    1:41 PM 05/25/2021    9:44 AM 10/14/2020   10:01 AM 01/01/2020    4:20 PM  Diabetic Labs  HbA1c <5.7 % of total Hgb     5.8   Chol 100 - 199 mg/dL 126   132  159  141   HDL >39 mg/dL 41   45  48  40   Calc LDL 0 - 99 mg/dL 64   71  84  78   Triglycerides 0 - 149 mg/dL 118   85  158  135   Creatinine 0.76 - 1.27 mg/dL 1.03  0.95  0.87  1.12  0.92       07/02/2022    7:06 PM 07/02/2022    9:21 AM 07/02/2022    8:55 AM 07/02/2022    8:54 AM 05/24/2022    4:30 PM 05/24/2022    2:13 PM 05/24/2022    2:11 PM  BP/Weight  Systolic BP 479 987 215 872 761 848   Diastolic BP 41 80 68 68 75 60   Wt. (Lbs) 223   230   210  BMI 32 kg/m2   33 kg/m2   31.01 kg/m2      01/08/2020    1:00 PM  Foot/eye exam completion dates  Foot Form Completion Done

## 2022-07-02 NOTE — Assessment & Plan Note (Signed)
  Patient re-educated about  the importance of commitment to a  minimum of 150 minutes of exercise per week as able.  The importance of healthy food choices with portion control discussed, as well as eating regularly and within a 12 hour window most days. The need to choose "clean , green" food 50 to 75% of the time is discussed, as well as to make water the primary drink and set a goal of 64 ounces water daily.       07/02/2022    7:06 PM 07/02/2022    8:54 AM 05/24/2022    2:11 PM  Weight /BMI  Weight 223 lb 230 lb 210 lb  Height '5\' 10"'$  (1.778 m) '5\' 10"'$  (1.778 m) '5\' 9"'$  (1.753 m)  BMI 32 kg/m2 33 kg/m2 31.01 kg/m2

## 2022-07-02 NOTE — Patient Instructions (Signed)
Follow-up in 5 months call if you need me before  Urine to be checked today for infection.  We will obtain a weight today in office as able.  Flu vaccine today.  Please reconsider and do get your COVID vaccine as well as your shingles vaccines.  You can get these from your [facility].  Labs today CMP and EGFR and lipid panel.  Be careful not to fall.  Thanks for choosing [facility], we consider it a privelige to serve you.

## 2022-07-02 NOTE — Assessment & Plan Note (Signed)
Hyperpigmented, irregular lesionon rigth cheek, pt and grandson elect to continue to watch this

## 2022-07-02 NOTE — Assessment & Plan Note (Signed)
CCUA abn , send for c/S

## 2022-07-03 LAB — CMP14+EGFR
ALT: 12 IU/L (ref 0–44)
AST: 21 IU/L (ref 0–40)
Albumin/Globulin Ratio: 1.5 (ref 1.2–2.2)
Albumin: 3.9 g/dL (ref 3.7–4.7)
Alkaline Phosphatase: 52 IU/L (ref 44–121)
BUN/Creatinine Ratio: 17 (ref 10–24)
BUN: 16 mg/dL (ref 8–27)
Bilirubin Total: 0.3 mg/dL (ref 0.0–1.2)
CO2: 23 mmol/L (ref 20–29)
Calcium: 9.7 mg/dL (ref 8.6–10.2)
Chloride: 100 mmol/L (ref 96–106)
Creatinine, Ser: 0.94 mg/dL (ref 0.76–1.27)
Globulin, Total: 2.6 g/dL (ref 1.5–4.5)
Glucose: 100 mg/dL — ABNORMAL HIGH (ref 70–99)
Potassium: 4.4 mmol/L (ref 3.5–5.2)
Sodium: 136 mmol/L (ref 134–144)
Total Protein: 6.5 g/dL (ref 6.0–8.5)
eGFR: 80 mL/min/{1.73_m2} (ref >59)

## 2022-07-03 LAB — LIPID PANEL
Chol/HDL Ratio: 2.8 ratio (ref 0.0–5.0)
Cholesterol, Total: 128 mg/dL (ref 100–199)
HDL: 45 mg/dL (ref >39)
LDL Chol Calc (NIH): 61 mg/dL (ref 0–99)
Triglycerides: 121 mg/dL (ref 0–149)
VLDL Cholesterol Cal: 22 mg/dL (ref 5–40)

## 2022-07-05 LAB — URINE CULTURE

## 2022-08-19 ENCOUNTER — Other Ambulatory Visit: Payer: Self-pay | Admitting: Family Medicine

## 2022-08-19 ENCOUNTER — Ambulatory Visit: Payer: Medicare Other | Admitting: General Surgery

## 2022-09-03 ENCOUNTER — Ambulatory Visit: Payer: Medicare Other | Admitting: Cardiology

## 2022-09-09 ENCOUNTER — Other Ambulatory Visit: Payer: Self-pay | Admitting: Family Medicine

## 2022-10-22 ENCOUNTER — Other Ambulatory Visit: Payer: Self-pay

## 2022-10-22 ENCOUNTER — Inpatient Hospital Stay (HOSPITAL_COMMUNITY)
Admission: EM | Admit: 2022-10-22 | Discharge: 2022-11-01 | DRG: 329 | Disposition: A | Discharge status: Skilled Nursing Facility | Payer: Medicare Other | Attending: Family Medicine | Admitting: Family Medicine

## 2022-10-22 ENCOUNTER — Emergency Department (HOSPITAL_COMMUNITY): Payer: Medicare Other

## 2022-10-22 ENCOUNTER — Inpatient Hospital Stay (HOSPITAL_COMMUNITY): Payer: Medicare Other | Admitting: Anesthesiology

## 2022-10-22 ENCOUNTER — Inpatient Hospital Stay (HOSPITAL_COMMUNITY): Payer: Medicare Other

## 2022-10-22 ENCOUNTER — Encounter (HOSPITAL_COMMUNITY)
Admission: EM | Disposition: A | Discharge status: Skilled Nursing Facility | Payer: Self-pay | Source: Home / Self Care | Attending: Family Medicine

## 2022-10-22 ENCOUNTER — Encounter (HOSPITAL_COMMUNITY): Payer: Self-pay | Admitting: *Deleted

## 2022-10-22 ENCOUNTER — Telehealth: Payer: Self-pay | Admitting: Family Medicine

## 2022-10-22 DIAGNOSIS — K660 Peritoneal adhesions (postprocedural) (postinfection): Secondary | ICD-10-CM | POA: Diagnosis present

## 2022-10-22 DIAGNOSIS — G4700 Insomnia, unspecified: Secondary | ICD-10-CM | POA: Diagnosis not present

## 2022-10-22 DIAGNOSIS — I214 Non-ST elevation (NSTEMI) myocardial infarction: Secondary | ICD-10-CM | POA: Diagnosis not present

## 2022-10-22 DIAGNOSIS — R310 Gross hematuria: Secondary | ICD-10-CM | POA: Diagnosis not present

## 2022-10-22 DIAGNOSIS — L89156 Pressure-induced deep tissue damage of sacral region: Secondary | ICD-10-CM | POA: Diagnosis present

## 2022-10-22 DIAGNOSIS — Z7902 Long term (current) use of antithrombotics/antiplatelets: Secondary | ICD-10-CM

## 2022-10-22 DIAGNOSIS — I5020 Unspecified systolic (congestive) heart failure: Secondary | ICD-10-CM | POA: Diagnosis not present

## 2022-10-22 DIAGNOSIS — I350 Nonrheumatic aortic (valve) stenosis: Secondary | ICD-10-CM | POA: Diagnosis present

## 2022-10-22 DIAGNOSIS — I110 Hypertensive heart disease with heart failure: Secondary | ICD-10-CM | POA: Diagnosis not present

## 2022-10-22 DIAGNOSIS — E440 Moderate protein-calorie malnutrition: Secondary | ICD-10-CM | POA: Diagnosis present

## 2022-10-22 DIAGNOSIS — J984 Other disorders of lung: Secondary | ICD-10-CM | POA: Diagnosis not present

## 2022-10-22 DIAGNOSIS — I4891 Unspecified atrial fibrillation: Secondary | ICD-10-CM

## 2022-10-22 DIAGNOSIS — D638 Anemia in other chronic diseases classified elsewhere: Secondary | ICD-10-CM | POA: Diagnosis present

## 2022-10-22 DIAGNOSIS — E6609 Other obesity due to excess calories: Secondary | ICD-10-CM | POA: Diagnosis not present

## 2022-10-22 DIAGNOSIS — I509 Heart failure, unspecified: Secondary | ICD-10-CM | POA: Diagnosis not present

## 2022-10-22 DIAGNOSIS — K4040 Unilateral inguinal hernia, with gangrene, not specified as recurrent: Secondary | ICD-10-CM | POA: Diagnosis not present

## 2022-10-22 DIAGNOSIS — R488 Other symbolic dysfunctions: Secondary | ICD-10-CM | POA: Diagnosis not present

## 2022-10-22 DIAGNOSIS — J9601 Acute respiratory failure with hypoxia: Secondary | ICD-10-CM | POA: Diagnosis not present

## 2022-10-22 DIAGNOSIS — I25110 Atherosclerotic heart disease of native coronary artery with unstable angina pectoris: Secondary | ICD-10-CM | POA: Diagnosis present

## 2022-10-22 DIAGNOSIS — K567 Ileus, unspecified: Secondary | ICD-10-CM | POA: Diagnosis not present

## 2022-10-22 DIAGNOSIS — L8992 Pressure ulcer of unspecified site, stage 2: Secondary | ICD-10-CM | POA: Diagnosis not present

## 2022-10-22 DIAGNOSIS — Z8042 Family history of malignant neoplasm of prostate: Secondary | ICD-10-CM

## 2022-10-22 DIAGNOSIS — K436 Other and unspecified ventral hernia with obstruction, without gangrene: Secondary | ICD-10-CM

## 2022-10-22 DIAGNOSIS — K9189 Other postprocedural complications and disorders of digestive system: Secondary | ICD-10-CM | POA: Diagnosis not present

## 2022-10-22 DIAGNOSIS — I480 Paroxysmal atrial fibrillation: Secondary | ICD-10-CM | POA: Diagnosis not present

## 2022-10-22 DIAGNOSIS — K55069 Acute infarction of intestine, part and extent unspecified: Secondary | ICD-10-CM | POA: Diagnosis not present

## 2022-10-22 DIAGNOSIS — T8111XA Postprocedural  cardiogenic shock, initial encounter: Secondary | ICD-10-CM | POA: Diagnosis not present

## 2022-10-22 DIAGNOSIS — R7302 Impaired glucose tolerance (oral): Secondary | ICD-10-CM | POA: Diagnosis not present

## 2022-10-22 DIAGNOSIS — E872 Acidosis, unspecified: Secondary | ICD-10-CM | POA: Diagnosis not present

## 2022-10-22 DIAGNOSIS — K439 Ventral hernia without obstruction or gangrene: Secondary | ICD-10-CM | POA: Diagnosis not present

## 2022-10-22 DIAGNOSIS — Z87891 Personal history of nicotine dependence: Secondary | ICD-10-CM

## 2022-10-22 DIAGNOSIS — Z8249 Family history of ischemic heart disease and other diseases of the circulatory system: Secondary | ICD-10-CM

## 2022-10-22 DIAGNOSIS — E669 Obesity, unspecified: Secondary | ICD-10-CM | POA: Diagnosis present

## 2022-10-22 DIAGNOSIS — A419 Sepsis, unspecified organism: Secondary | ICD-10-CM | POA: Diagnosis not present

## 2022-10-22 DIAGNOSIS — M6281 Muscle weakness (generalized): Secondary | ICD-10-CM | POA: Diagnosis not present

## 2022-10-22 DIAGNOSIS — D62 Acute posthemorrhagic anemia: Secondary | ICD-10-CM | POA: Diagnosis not present

## 2022-10-22 DIAGNOSIS — I69354 Hemiplegia and hemiparesis following cerebral infarction affecting left non-dominant side: Secondary | ICD-10-CM | POA: Diagnosis not present

## 2022-10-22 DIAGNOSIS — K430 Incisional hernia with obstruction, without gangrene: Principal | ICD-10-CM | POA: Diagnosis present

## 2022-10-22 DIAGNOSIS — G4733 Obstructive sleep apnea (adult) (pediatric): Secondary | ICD-10-CM | POA: Diagnosis present

## 2022-10-22 DIAGNOSIS — I21A1 Myocardial infarction type 2: Secondary | ICD-10-CM | POA: Diagnosis not present

## 2022-10-22 DIAGNOSIS — Z7982 Long term (current) use of aspirin: Secondary | ICD-10-CM

## 2022-10-22 DIAGNOSIS — R578 Other shock: Secondary | ICD-10-CM | POA: Diagnosis not present

## 2022-10-22 DIAGNOSIS — E876 Hypokalemia: Secondary | ICD-10-CM | POA: Diagnosis not present

## 2022-10-22 DIAGNOSIS — Z452 Encounter for adjustment and management of vascular access device: Secondary | ICD-10-CM | POA: Diagnosis not present

## 2022-10-22 DIAGNOSIS — I10 Essential (primary) hypertension: Secondary | ICD-10-CM | POA: Diagnosis not present

## 2022-10-22 DIAGNOSIS — K460 Unspecified abdominal hernia with obstruction, without gangrene: Principal | ICD-10-CM | POA: Diagnosis present

## 2022-10-22 DIAGNOSIS — Z886 Allergy status to analgesic agent status: Secondary | ICD-10-CM

## 2022-10-22 DIAGNOSIS — I700 Atherosclerosis of aorta: Secondary | ICD-10-CM | POA: Diagnosis not present

## 2022-10-22 DIAGNOSIS — R1312 Dysphagia, oropharyngeal phase: Secondary | ICD-10-CM | POA: Diagnosis not present

## 2022-10-22 DIAGNOSIS — E785 Hyperlipidemia, unspecified: Secondary | ICD-10-CM | POA: Diagnosis not present

## 2022-10-22 DIAGNOSIS — I5023 Acute on chronic systolic (congestive) heart failure: Secondary | ICD-10-CM | POA: Diagnosis not present

## 2022-10-22 DIAGNOSIS — Z48815 Encounter for surgical aftercare following surgery on the digestive system: Secondary | ICD-10-CM | POA: Diagnosis not present

## 2022-10-22 DIAGNOSIS — K55021 Focal (segmental) acute infarction of small intestine: Secondary | ICD-10-CM | POA: Diagnosis not present

## 2022-10-22 DIAGNOSIS — L89152 Pressure ulcer of sacral region, stage 2: Secondary | ICD-10-CM | POA: Diagnosis not present

## 2022-10-22 DIAGNOSIS — I2510 Atherosclerotic heart disease of native coronary artery without angina pectoris: Secondary | ICD-10-CM | POA: Diagnosis not present

## 2022-10-22 DIAGNOSIS — R0989 Other specified symptoms and signs involving the circulatory and respiratory systems: Secondary | ICD-10-CM | POA: Diagnosis not present

## 2022-10-22 DIAGNOSIS — Z825 Family history of asthma and other chronic lower respiratory diseases: Secondary | ICD-10-CM

## 2022-10-22 DIAGNOSIS — F419 Anxiety disorder, unspecified: Secondary | ICD-10-CM | POA: Diagnosis present

## 2022-10-22 DIAGNOSIS — R6521 Severe sepsis with septic shock: Secondary | ICD-10-CM | POA: Diagnosis not present

## 2022-10-22 DIAGNOSIS — R531 Weakness: Secondary | ICD-10-CM | POA: Diagnosis not present

## 2022-10-22 DIAGNOSIS — R7303 Prediabetes: Secondary | ICD-10-CM | POA: Diagnosis not present

## 2022-10-22 DIAGNOSIS — R011 Cardiac murmur, unspecified: Secondary | ICD-10-CM | POA: Diagnosis not present

## 2022-10-22 DIAGNOSIS — Z4682 Encounter for fitting and adjustment of non-vascular catheter: Secondary | ICD-10-CM | POA: Diagnosis not present

## 2022-10-22 DIAGNOSIS — I25700 Atherosclerosis of coronary artery bypass graft(s), unspecified, with unstable angina pectoris: Secondary | ICD-10-CM | POA: Diagnosis not present

## 2022-10-22 DIAGNOSIS — S37022D Major contusion of left kidney, subsequent encounter: Secondary | ICD-10-CM

## 2022-10-22 DIAGNOSIS — I639 Cerebral infarction, unspecified: Secondary | ICD-10-CM | POA: Diagnosis not present

## 2022-10-22 DIAGNOSIS — J9690 Respiratory failure, unspecified, unspecified whether with hypoxia or hypercapnia: Secondary | ICD-10-CM | POA: Diagnosis not present

## 2022-10-22 DIAGNOSIS — Z7401 Bed confinement status: Secondary | ICD-10-CM | POA: Diagnosis not present

## 2022-10-22 DIAGNOSIS — Z91030 Bee allergy status: Secondary | ICD-10-CM

## 2022-10-22 DIAGNOSIS — I517 Cardiomegaly: Secondary | ICD-10-CM | POA: Diagnosis not present

## 2022-10-22 DIAGNOSIS — R2681 Unsteadiness on feet: Secondary | ICD-10-CM | POA: Diagnosis not present

## 2022-10-22 DIAGNOSIS — Z823 Family history of stroke: Secondary | ICD-10-CM

## 2022-10-22 DIAGNOSIS — Z9989 Dependence on other enabling machines and devices: Secondary | ICD-10-CM

## 2022-10-22 DIAGNOSIS — J90 Pleural effusion, not elsewhere classified: Secondary | ICD-10-CM | POA: Diagnosis not present

## 2022-10-22 DIAGNOSIS — Z9911 Dependence on respirator [ventilator] status: Secondary | ICD-10-CM | POA: Diagnosis not present

## 2022-10-22 DIAGNOSIS — Z800 Family history of malignant neoplasm of digestive organs: Secondary | ICD-10-CM

## 2022-10-22 DIAGNOSIS — I5021 Acute systolic (congestive) heart failure: Secondary | ICD-10-CM | POA: Diagnosis present

## 2022-10-22 DIAGNOSIS — Z6832 Body mass index (BMI) 32.0-32.9, adult: Secondary | ICD-10-CM

## 2022-10-22 DIAGNOSIS — R279 Unspecified lack of coordination: Secondary | ICD-10-CM | POA: Diagnosis not present

## 2022-10-22 DIAGNOSIS — J9691 Respiratory failure, unspecified with hypoxia: Secondary | ICD-10-CM | POA: Diagnosis not present

## 2022-10-22 DIAGNOSIS — S37012D Minor contusion of left kidney, subsequent encounter: Secondary | ICD-10-CM | POA: Diagnosis not present

## 2022-10-22 DIAGNOSIS — Z951 Presence of aortocoronary bypass graft: Secondary | ICD-10-CM

## 2022-10-22 DIAGNOSIS — Z85038 Personal history of other malignant neoplasm of large intestine: Secondary | ICD-10-CM

## 2022-10-22 DIAGNOSIS — Z79899 Other long term (current) drug therapy: Secondary | ICD-10-CM

## 2022-10-22 HISTORY — PX: VENTRAL HERNIA REPAIR: SHX424

## 2022-10-22 HISTORY — PX: LAPAROTOMY: SHX154

## 2022-10-22 HISTORY — DX: Umbilical hernia without obstruction or gangrene: K42.9

## 2022-10-22 HISTORY — PX: BOWEL RESECTION: SHX1257

## 2022-10-22 HISTORY — PX: OMENTECTOMY: SHX5985

## 2022-10-22 LAB — BLOOD GAS, ARTERIAL
Acid-Base Excess: 2.6 mmol/L — ABNORMAL HIGH (ref 0.0–2.0)
Bicarbonate: 27.2 mmol/L (ref 20.0–28.0)
Drawn by: 22223
FIO2: 100 %
O2 Saturation: 100 %
Patient temperature: 37
pCO2 arterial: 41 mmHg (ref 32–48)
pH, Arterial: 7.43 (ref 7.35–7.45)
pO2, Arterial: 216 mmHg — ABNORMAL HIGH (ref 83–108)

## 2022-10-22 LAB — CBC WITH DIFFERENTIAL/PLATELET
Abs Immature Granulocytes: 0.1 10*3/uL — ABNORMAL HIGH (ref 0.00–0.07)
Basophils Absolute: 0 10*3/uL (ref 0.0–0.1)
Basophils Relative: 0 %
Eosinophils Absolute: 0 10*3/uL (ref 0.0–0.5)
Eosinophils Relative: 0 %
HCT: 39.7 % (ref 39.0–52.0)
Hemoglobin: 12.8 g/dL — ABNORMAL LOW (ref 13.0–17.0)
Immature Granulocytes: 1 %
Lymphocytes Relative: 4 %
Lymphs Abs: 0.8 10*3/uL (ref 0.7–4.0)
MCH: 31.7 pg (ref 26.0–34.0)
MCHC: 32.2 g/dL (ref 30.0–36.0)
MCV: 98.3 fL (ref 80.0–100.0)
Monocytes Absolute: 1.2 10*3/uL — ABNORMAL HIGH (ref 0.1–1.0)
Monocytes Relative: 7 %
Neutro Abs: 15.1 10*3/uL — ABNORMAL HIGH (ref 1.7–7.7)
Neutrophils Relative %: 88 %
Platelets: 194 10*3/uL (ref 150–400)
RBC: 4.04 MIL/uL — ABNORMAL LOW (ref 4.22–5.81)
RDW: 13.5 % (ref 11.5–15.5)
WBC: 17.2 10*3/uL — ABNORMAL HIGH (ref 4.0–10.5)
nRBC: 0 % (ref 0.0–0.2)

## 2022-10-22 LAB — COMPREHENSIVE METABOLIC PANEL
ALT: 15 U/L (ref 0–44)
AST: 28 U/L (ref 15–41)
Albumin: 4.2 g/dL (ref 3.5–5.0)
Alkaline Phosphatase: 39 U/L (ref 38–126)
Anion gap: 12 (ref 5–15)
BUN: 23 mg/dL (ref 8–23)
CO2: 28 mmol/L (ref 22–32)
Calcium: 10.4 mg/dL — ABNORMAL HIGH (ref 8.9–10.3)
Chloride: 99 mmol/L (ref 98–111)
Creatinine, Ser: 1.08 mg/dL (ref 0.61–1.24)
GFR, Estimated: >60 mL/min (ref >60)
Glucose, Bld: 174 mg/dL — ABNORMAL HIGH (ref 70–99)
Potassium: 4.4 mmol/L (ref 3.5–5.1)
Sodium: 139 mmol/L (ref 135–145)
Total Bilirubin: 1.2 mg/dL (ref 0.3–1.2)
Total Protein: 7.7 g/dL (ref 6.5–8.1)

## 2022-10-22 LAB — LACTIC ACID, PLASMA
Lactic Acid, Venous: 2.6 mmol/L (ref 0.5–1.9)
Lactic Acid, Venous: 2.3 mmol/L (ref 0.5–1.9)

## 2022-10-22 SURGERY — LAPAROTOMY, EXPLORATORY
Anesthesia: General | Site: Abdomen

## 2022-10-22 MED ORDER — FENTANYL CITRATE (PF) 100 MCG/2ML IJ SOLN
INTRAMUSCULAR | Status: AC
  Filled 2022-10-22: qty 2

## 2022-10-22 MED ORDER — HYDROMORPHONE HCL 1 MG/ML IJ SOLN: 0.5000 mg | INTRAMUSCULAR | Status: DC | PRN

## 2022-10-22 MED ORDER — HYDROMORPHONE HCL 1 MG/ML IJ SOLN
0.5000 mg | Freq: Once | INTRAMUSCULAR | Status: AC
  Administered 2022-10-22: 0.5 mg via INTRAVENOUS
  Filled 2022-10-22: qty 0.5

## 2022-10-22 MED ORDER — ONDANSETRON HCL 4 MG/2ML IJ SOLN
4.0000 mg | Freq: Once | INTRAMUSCULAR | Status: AC
  Administered 2022-10-22: 4 mg via INTRAVENOUS
  Filled 2022-10-22: qty 2

## 2022-10-22 MED ORDER — SODIUM CHLORIDE 0.9 % IV SOLN
INTRAVENOUS | Status: AC
  Filled 2022-10-22: qty 2

## 2022-10-22 MED ORDER — ROCURONIUM BROMIDE 10 MG/ML (PF) SYRINGE
PREFILLED_SYRINGE | INTRAVENOUS | Status: AC
  Filled 2022-10-22: qty 10

## 2022-10-22 MED ORDER — SODIUM CHLORIDE 0.9 % IV SOLN
INTRAVENOUS | Status: DC | PRN
  Administered 2022-10-22: 2 g via INTRAVENOUS

## 2022-10-22 MED ORDER — BUPIVACAINE LIPOSOME 1.3 % IJ SUSP
INTRAMUSCULAR | Status: AC
  Filled 2022-10-22: qty 20

## 2022-10-22 MED ORDER — SODIUM CHLORIDE 0.9 % IV SOLN
3.0000 g | Freq: Once | INTRAVENOUS | Status: AC
  Administered 2022-10-22: 3 g via INTRAVENOUS
  Filled 2022-10-22: qty 8

## 2022-10-22 MED ORDER — ONDANSETRON HCL 4 MG PO TABS: 4.0000 mg | ORAL_TABLET | Freq: Four times a day (QID) | ORAL | Status: DC | PRN

## 2022-10-22 MED ORDER — BISACODYL 10 MG RE SUPP
10.0000 mg | Freq: Every day | RECTAL | Status: DC
  Administered 2022-10-23 – 2022-11-01 (×5): 10 mg via RECTAL
  Filled 2022-10-22 (×6): qty 1

## 2022-10-22 MED ORDER — SODIUM CHLORIDE 0.9 % IV SOLN: INTRAVENOUS | Status: DC

## 2022-10-22 MED ORDER — ACETAMINOPHEN 10 MG/ML IV SOLN
1000.0000 mg | Freq: Four times a day (QID) | INTRAVENOUS | Status: AC
  Administered 2022-10-22 – 2022-10-23 (×4): 1000 mg via INTRAVENOUS
  Filled 2022-10-22 (×5): qty 100

## 2022-10-22 MED ORDER — AMPICILLIN-SULBACTAM SODIUM 3 (2-1) G IV SOLR: 3.0000 g | Freq: Once | INTRAVENOUS | Status: DC

## 2022-10-22 MED ORDER — ETOMIDATE 2 MG/ML IV SOLN
INTRAVENOUS | Status: AC
  Filled 2022-10-22: qty 10

## 2022-10-22 MED ORDER — PROPOFOL 10 MG/ML IV BOLUS
INTRAVENOUS | Status: AC
  Filled 2022-10-22: qty 20

## 2022-10-22 MED ORDER — IOHEXOL 300 MG/ML  SOLN
100.0000 mL | Freq: Once | INTRAMUSCULAR | Status: AC | PRN
  Administered 2022-10-22: 100 mL via INTRAVENOUS

## 2022-10-22 MED ORDER — FENTANYL CITRATE (PF) 100 MCG/2ML IJ SOLN
INTRAMUSCULAR | Status: DC | PRN
  Administered 2022-10-22 (×2): 50 ug via INTRAVENOUS

## 2022-10-22 MED ORDER — MIDAZOLAM HCL 5 MG/5ML IJ SOLN
INTRAMUSCULAR | Status: DC | PRN
  Administered 2022-10-22: 2 mg via INTRAVENOUS

## 2022-10-22 MED ORDER — LACTATED RINGERS IV SOLN: INTRAVENOUS | Status: DC | PRN

## 2022-10-22 MED ORDER — ONDANSETRON HCL 4 MG/2ML IJ SOLN: 4.0000 mg | Freq: Four times a day (QID) | INTRAMUSCULAR | Status: DC | PRN

## 2022-10-22 MED ORDER — ETOMIDATE 2 MG/ML IV SOLN
INTRAVENOUS | Status: DC | PRN
  Administered 2022-10-22: 16 mg via INTRAVENOUS

## 2022-10-22 MED ORDER — SODIUM CHLORIDE 0.9 % IV BOLUS
1000.0000 mL | Freq: Once | INTRAVENOUS | Status: AC
  Administered 2022-10-22: 1000 mL via INTRAVENOUS

## 2022-10-22 MED ORDER — PROPOFOL 1000 MG/100ML IV EMUL
5.0000 ug/kg/min | INTRAVENOUS | Status: DC
  Administered 2022-10-22 – 2022-10-23 (×4): 5 ug/kg/min via INTRAVENOUS
  Administered 2022-10-23: 15 ug/kg/min via INTRAVENOUS
  Filled 2022-10-22: qty 100

## 2022-10-22 MED ORDER — BUPIVACAINE LIPOSOME 1.3 % IJ SUSP
INTRAMUSCULAR | Status: DC | PRN
  Administered 2022-10-22: 20 mL

## 2022-10-22 MED ORDER — SODIUM CHLORIDE 0.9 % IR SOLN
Status: DC | PRN
  Administered 2022-10-22: 1000 mL

## 2022-10-22 MED ORDER — ROCURONIUM BROMIDE 100 MG/10ML IV SOLN
INTRAVENOUS | Status: DC | PRN
  Administered 2022-10-22: 100 mg via INTRAVENOUS
  Administered 2022-10-22: 50 mg via INTRAVENOUS

## 2022-10-22 MED ORDER — MIDAZOLAM HCL 2 MG/2ML IJ SOLN
INTRAMUSCULAR | Status: AC
  Filled 2022-10-22: qty 2

## 2022-10-22 MED ORDER — FENTANYL CITRATE PF 50 MCG/ML IJ SOSY
50.0000 ug | PREFILLED_SYRINGE | INTRAMUSCULAR | Status: DC | PRN
  Administered 2022-10-23 (×3): 50 ug via INTRAVENOUS
  Filled 2022-10-22 (×3): qty 1

## 2022-10-22 SURGICAL SUPPLY — 69 items
APL PRP STRL LF DISP 70% ISPRP (MISCELLANEOUS) ×2
APPLIER CLIP 11 MED OPEN (CLIP)
APPLIER CLIP 13 LRG OPEN (CLIP)
APR CLP LRG 13 20 CLIP (CLIP)
APR CLP MED 11 20 MLT OPN (CLIP)
BARRIER SKIN 2 3/4 (OSTOMY) IMPLANT
BARRIER SKIN OD2.25 2 3/4 FLNG (OSTOMY) IMPLANT
BRR SKN FLT 2.75X2.25 2 PC (OSTOMY)
CHLORAPREP W/TINT 26 (MISCELLANEOUS) ×2 IMPLANT
CLAMP POUCH DRAINAGE QUIET (OSTOMY) IMPLANT
CLIP APPLIE 11 MED OPEN (CLIP) IMPLANT
CLIP APPLIE 13 LRG OPEN (CLIP) IMPLANT
CLOTH BEACON ORANGE TIMEOUT ST (SAFETY) ×2 IMPLANT
COVER LIGHT HANDLE STERIS (MISCELLANEOUS) ×4 IMPLANT
DRAPE WARM FLUID 44X44 (DRAPES) ×2 IMPLANT
DRSG OPSITE POSTOP 4X10 (GAUZE/BANDAGES/DRESSINGS) IMPLANT
DRSG OPSITE POSTOP 4X8 (GAUZE/BANDAGES/DRESSINGS) IMPLANT
ELECT BLADE 6 FLAT ULTRCLN (ELECTRODE) IMPLANT
ELECT REM PT RETURN 9FT ADLT (ELECTROSURGICAL) ×2
ELECTRODE REM PT RTRN 9FT ADLT (ELECTROSURGICAL) ×2 IMPLANT
GLOVE BIOGEL M 6.5 STRL (GLOVE) IMPLANT
GLOVE BIOGEL PI IND STRL 6.5 (GLOVE) ×2 IMPLANT
GLOVE BIOGEL PI IND STRL 7.0 (GLOVE) ×4 IMPLANT
GLOVE BIOGEL PI IND STRL 7.5 (GLOVE) IMPLANT
GLOVE SURG SS PI 6.5 STRL IVOR (GLOVE) ×4 IMPLANT
GOWN STRL REUS W/TWL LRG LVL3 (GOWN DISPOSABLE) ×6 IMPLANT
HANDLE SUCTION POOLE (INSTRUMENTS) ×2 IMPLANT
INST SET MAJOR GENERAL (KITS) ×2 IMPLANT
KIT REMOVER STAPLE SKIN (MISCELLANEOUS) IMPLANT
KIT TURNOVER KIT A (KITS) ×2 IMPLANT
LIGASURE IMPACT 36 18CM CVD LR (INSTRUMENTS) IMPLANT
MANIFOLD NEPTUNE II (INSTRUMENTS) ×2 IMPLANT
NDL HYPO 18GX1.5 BLUNT FILL (NEEDLE) ×2 IMPLANT
NDL HYPO 21X1.5 SAFETY (NEEDLE) ×2 IMPLANT
NEEDLE HYPO 18GX1.5 BLUNT FILL (NEEDLE) ×2 IMPLANT
NEEDLE HYPO 21X1.5 SAFETY (NEEDLE) ×2 IMPLANT
NS IRRIG 1000ML POUR BTL (IV SOLUTION) ×4 IMPLANT
PACK MAJOR ABDOMINAL (CUSTOM PROCEDURE TRAY) ×2 IMPLANT
PAD ARMBOARD 7.5X6 YLW CONV (MISCELLANEOUS) ×2 IMPLANT
PENCIL SMOKE EVACUATOR COATED (MISCELLANEOUS) ×2 IMPLANT
POUCH OSTOMY 2 3/4  H 3804 (WOUND CARE)
POUCH OSTOMY 2 3/4 H 3804 (WOUND CARE)
POUCH OSTOMY 2 PC DRNBL 2.75 (WOUND CARE) IMPLANT
RELOAD LINEAR CUT PROX 55 BLUE (ENDOMECHANICALS) IMPLANT
RELOAD PROXIMATE 75MM BLUE (ENDOMECHANICALS) ×4 IMPLANT
RELOAD STAPLE 55 3.8 BLU REG (ENDOMECHANICALS) IMPLANT
RELOAD STAPLE 75 3.8 BLU REG (ENDOMECHANICALS) IMPLANT
RETRACTOR WND ALEXIS-O 25 LRG (MISCELLANEOUS) IMPLANT
RETRACTOR WOUND ALXS 18CM MED (MISCELLANEOUS) IMPLANT
RETRACTOR WOUND ALXS 25CM LRG (MISCELLANEOUS) IMPLANT
RTRCTR WOUND ALEXIS O 18CM MED (MISCELLANEOUS)
RTRCTR WOUND ALEXIS O 25CM LRG (MISCELLANEOUS)
SET BASIN LINEN APH (SET/KITS/TRAYS/PACK) ×2 IMPLANT
SPONGE T-LAP 18X18 ~~LOC~~+RFID (SPONGE) ×2 IMPLANT
STAPLER GUN LINEAR PROX 60 (STAPLE) IMPLANT
STAPLER PROXIMATE 55 BLUE (STAPLE) IMPLANT
STAPLER PROXIMATE 75MM BLUE (STAPLE) IMPLANT
STAPLER VISISTAT (STAPLE) ×2 IMPLANT
SUCTION POOLE HANDLE (INSTRUMENTS) ×2
SUT CHROMIC 0 SH (SUTURE) IMPLANT
SUT CHROMIC 2 0 CT 1 (SUTURE) IMPLANT
SUT CHROMIC 2 0 SH (SUTURE) IMPLANT
SUT CHROMIC 3 0 SH 27 (SUTURE) IMPLANT
SUT PDS AB CT VIOLET #0 27IN (SUTURE) ×4 IMPLANT
SUT PROLENE 2 0 SH 30 (SUTURE) IMPLANT
SUT SILK 3 0 SH CR/8 (SUTURE) ×2 IMPLANT
SUT VICRYL AB 3-0 BRD CT 36IN (SUTURE) IMPLANT
SYR 20ML LL LF (SYRINGE) ×4 IMPLANT
TRAY FOLEY MTR SLVR 16FR STAT (SET/KITS/TRAYS/PACK) ×2 IMPLANT

## 2022-10-22 NOTE — H&P (Signed)
History and Physical    Akihiro Cogburn Nez [id number] DOB: June 13, 1941 DOA: 10/22/2022  I have briefly reviewed the patient's prior medical records in [place]  PCP: Fayrene Helper, MD  Patient coming from: home  Chief Complaint: Abdominal pain, nausea or vomiting  HPI: Craig Fields is a 82 y.o. male with medical history significant of recurrent ventral hernia, CAD status post CABG, prior CVA, on chronic aspirin and Plavix, HTN, HLD, comes to the hospital with complaints of abdominal pain.  His wife is at bedside, and tells me that he has been having this hernia for several years, and occasionally has a "spell", when the hernia gets out but eventually reduces on its own.  This time, his hernia has been persistent since yesterday, he has been having increased pain as well as nausea and vomiting.  He denies any fever or chills, he denies any chest pain, denies any shortness of breath.  Up until yesterday he was at his baseline state of health.  He does not do much, but is able to move in the home using a cane.  ED Course: In the emergency room is he is afebrile, normotensive, satting well on room air.  Did have a soft blood pressure after receiving IV pain medications but quickly rebounded.  A CT scan of the abdomen pelvis showed a ventral abdominal hernia with small bowel loops and surrounding soft tissue edema concerning for an incarcerated hernia resulting in a small bowel obstruction.  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  General surgery was consulted, the plan today for operative repair urgently tonight and we are asked to admit due to medical comorbidities.  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    Essential hypertension    Hematuria 04/30/2019   New complaint, does nott have pain of kidney stones, needs to submit UA for testing asap and needs urology eval   Hyperlipidemia    IGT (impaired glucose tolerance)    Obesity    OSA (obstructive sleep apnea)    Seizures ([place])    Age 24 or 3, no meds and no reoccurances   Skin lesion 12/07/2012   Skin tag 11/16/2011   Stroke [facility])    Umbilical hernia     Past Surgical History:  Procedure Laterality Date   COLONOSCOPY N/A 07/15/2015   Procedure: COLONOSCOPY;  Surgeon: Aviva Signs, MD;  Location: [facility] ENDO SUITE;  Service: Gastroenterology;  Laterality: N/A;   CORONARY ARTERY BYPASS GRAFT  09/13/2002   x4   HERNIA REPAIR     INCISIONAL HERNIA REPAIR N/A 01/01/2013   Procedure: HERNIA REPAIR INCISIONAL;  Surgeon: Jamesetta So, MD;  Location: [facility] ORS;  Service: General;  Laterality: N/A;  umbilical area   INSERTION OF MESH N/A 01/01/2013   Procedure: INSERTION OF MESH;  Surgeon: Jamesetta So, MD;  Location: [facility] ORS;  Service: General;  Laterality: N/A;   Left inguinal  hernia repair  09/14/2003   [name]  09/13/2005     reports that he quit smoking about 41 years ago. His smoking use included cigarettes. He has a 2.50 pack-year smoking  history. He quit smokeless tobacco use about 9 years ago.  His smokeless tobacco use included chew. He reports that he does not drink alcohol and does not use drugs.  Allergies  Allergen Reactions   Bee Venom Swelling   Niacin Other (See Comments)    Muscle aches    Family History  Problem Relation Age of Onset   Pancreatic cancer Mother    Heart disease Father    Prostate cancer Father    Stroke Brother    Heart disease Brother    Heart disease Brother    COPD Sister    Actinic keratosis Sister    Obesity Son    Anxiety disorder Son     Prior to Admission medications   Medication Sig Start Date End Date Taking? Authorizing  Provider  acetaminophen (TYLENOL) 650 MG CR tablet Take 1,900 mg by mouth in the morning and at bedtime.   Yes [provider]  aspirin 81 MG EC tablet Take 81 mg by mouth every evening.   Yes [provider]  Cholecalciferol (VITAMIN D) 2000 units CAPS Take 1 capsule by mouth 2 (two) times daily.   Yes [provider]  clopidogrel (PLAVIX) 75 MG tablet TAKE 1 TABLET BY MOUTH EVERY DAY Patient taking differently: Take 75 mg by mouth daily. 09/09/22  Yes Fayrene Helper, MD  Coenzyme Q10 (CO Q-10) 200 MG CAPS Take 1 tablet by mouth daily.    Yes [provider]  KLOR-CON M20 20 MEQ tablet TAKE 2 TABLETS BY MOUTH DAILY Patient taking differently: Take 40 mEq by mouth daily. 09/09/22  Yes Fayrene Helper, MD  lisinopril (ZESTRIL) 10 MG tablet TAKE 1 TABLET BY MOUTH EVERY DAY Patient taking differently: Take 10 mg by mouth daily. 08/19/22  Yes Fayrene Helper, MD  metoprolol tartrate (LOPRESSOR) 50 MG tablet TAKE HALF A TABLET BY MOUTH 2 (TWO) TIMES DAILY. Patient taking differently: Take 25 mg by mouth 2 (two) times daily. TAKE HALF A TABLET BY MOUTH 2 (TWO) TIMES DAILY. 09/09/22  Yes Fayrene Helper, MD  Multiple Vitamins-Minerals ([employer] SPECTRAVITE) TABS Take 1 tablet by mouth every evening.   Yes [provider]  polyethylene glycol powder (GLYCOLAX/MIRALAX) powder MIX TO TAKE 17 G DAILY AS DIRECTED Patient taking differently: Take 17 g by mouth daily as needed for mild constipation. 11/26/16  Yes Fayrene Helper, MD  simvastatin (ZOCOR) 20 MG tablet TAKE 1 TABLET BY MOUTH EVERYDAY AT BEDTIME Patient taking differently: Take 20 mg by mouth daily. 11/20/21  Yes Fayrene Helper, MD  triamterene-hydrochlorothiazide ([id number]) 37.5-25 MG tablet TAKE 1 TABLET BY MOUTH EVERY DAY IN THE MORNING Patient taking differently: Take 1 tablet by mouth daily. TAKE 1 TABLET BY MOUTH EVERY DAY IN THE MORNING 09/09/22  Yes Fayrene Helper, MD   oxyCODONE-acetaminophen (PERCOCET/ROXICET) 5-325 MG tablet Take one tablet by mouth two times daily, as needed, for severe pain 05/28/22   Fayrene Helper, MD  UNABLE TO FIND Commode assist chair 01/02/19   Fayrene Helper, MD  UNABLE TO FIND Bilateral new shoes  Left AFO with calliber plate  Dx [id number]   Perlie Mayo, NP  UNABLE TO FIND Right custom torch walker x 1  Dx [id number] 02/04/20   Perlie Mayo, NP    Physical Exam: Vitals:   10/22/22 1137 10/22/22 1605  BP: (!) 126/53 (!) 121/53  Pulse: 76 79  Resp: 16 16  Temp: 97.8 F (36.6 C) 98 F (  36.7 C)  TempSrc: Oral Oral  SpO2: 100% 100%  Weight: 84.8 kg   Height: 5' 10"$  (1.778 m)     Constitutional: NAD, calm, comfortable Eyes: PERRL, lids and conjunctivae normal ENMT: Mucous membranes are moist. Posterior pharynx clear of any exudate or lesions.Normal dentition.  Neck: normal, supple Respiratory: clear to auscultation bilaterally, no wheezing, no crackles.  Cardiovascular: Regular rate and rhythm, no murmurs / rubs / gallops.  Trace edema Abdomen: Ventral hernia easily visible, very tender to palpation.  Diminished bowel sounds Musculoskeletal: no clubbing / cyanosis. Normal muscle tone.  Skin: no rashes, lesions, ulcers. No induration Neurologic: Nonfocal  Labs on Admission: I have personally reviewed following labs and imaging studies  CBC: Recent Labs  Lab 10/22/22 1617  WBC 17.2*  NEUTROABS 15.1*  HGB 12.8*  HCT 39.7  MCV 98.3  PLT [id number]   Basic Metabolic Panel: Recent Labs  Lab 10/22/22 1617  NA 139  K 4.4  CL 99  CO2 28  GLUCOSE 174*  BUN 23  CREATININE 1.08  CALCIUM 10.4*   Liver Function Tests: Recent Labs  Lab 10/22/22 1617  AST 28  ALT 15  ALKPHOS 39  BILITOT 1.2  PROT 7.7  ALBUMIN 4.2   Coagulation Profile: No results for input(s): "INR", "PROTIME" in the last 168 hours. BNP (last 3 results) No results for input(s): "PROBNP" in the last 8760 hours. CBG: No  results for input(s): "GLUCAP" in the last 168 hours. Thyroid Function Tests: No results for input(s): "TSH", "T4TOTAL", "FREET4", "T3FREE", "THYROIDAB" in the last 72 hours. Urine analysis:    Component Value Date/Time   COLORURINE YELLOW 09/09/2021 1356   APPEARANCEUR CLEAR 09/09/2021 1356   LABSPEC 1.020 09/09/2021 1356   PHURINE 5.0 09/09/2021 1356   GLUCOSEU NEGATIVE 09/09/2021 1356   HGBUR NEGATIVE 09/09/2021 1356   BILIRUBINUR negative 07/02/2022 0945   KETONESUR negative 07/02/2022 0945   KETONESUR NEGATIVE 09/09/2021 1356   PROTEINUR NEGATIVE 09/09/2021 1356   UROBILINOGEN 0.2 07/02/2022 0945   NITRITE Negative 07/02/2022 0945   NITRITE NEGATIVE 09/09/2021 1356   LEUKOCYTESUR Small (1+) (A) 07/02/2022 0945   LEUKOCYTESUR MODERATE (A) 09/09/2021 1356     Radiological Exams on Admission: CT ABDOMEN PELVIS W CONTRAST  Result Date: 10/22/2022 CLINICAL DATA:  History of colon cancer. EXAM: CT ABDOMEN AND PELVIS WITH CONTRAST TECHNIQUE: Multidetector CT imaging of the abdomen and pelvis was performed using the standard protocol following bolus administration of intravenous contrast. RADIATION DOSE REDUCTION: This exam was performed according to the departmental dose-optimization program which includes automated exposure control, adjustment of the mA and/or kV according to patient size and/or use of iterative reconstruction technique. CONTRAST:  120m OMNIPAQUE IOHEXOL 300 MG/ML  SOLN COMPARISON:  05/11/2018 FINDINGS: Lower chest: Chronic left pleural effusion with pleural calcifications. Hepatobiliary: No focal liver abnormality is seen. No gallstones, gallbladder wall thickening, or biliary dilatation. Pancreas: Unremarkable. No pancreatic ductal dilatation or surrounding inflammatory changes. Spleen: Normal in size without focal abnormality. Adrenals/Urinary Tract: Adrenal glands are unremarkable. No urolithiasis or obstructive uropathy. 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 Stomach/Bowel: Numerous dilated loops of small bowel measuring up to 4.3 cm with multiple air-fluid levels. Ventral abdominal hernia containing numerous dilated loops of small bowel, surrounding soft tissue edema concerning for an incarcerated hernia resulting in small bowel obstruction. Distended stomach with an air-fluid level. Small hiatal hernia. Prior colectomy. Vascular/Lymphatic: Normal caliber abdominal aorta with mild atherosclerosis. No  lymphadenopathy. Reproductive: Enlarged prostate gland. Other: Fluid containing right inguinal hernia. Fat containing left inguinal hernia. No abdominal ascites. Musculoskeletal: No acute osseous abnormality. No aggressive osseous lesion. IMPRESSION: 1. Ventral abdominal hernia containing numerous dilated loops of small bowel, surrounding soft tissue edema concerning for an incarcerated hernia resulting in small bowel obstruction. 2. 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 3. Chronic left pleural effusion with pleural calcifications. 4.  Aortic Atherosclerosis (ICD10-I70.0). Electronically Signed   By: Kathreen Devoid M.D.   On: 10/22/2022 17:43    EKG: Independently reviewed.  Sinus rhythm  Assessment/Plan Principal problem Small bowel obstruction with incarcerated ventral hernia -n.p.o., NG tube per surgery.  He will be taken to the OR tonight, appreciate surgical input. -I have discussed with general surgery, patient relatively high risk given recent use of Plavix and overall multiple comorbidities.  Will admit to the ICU postop  Active problems CAD with history of CABG -long time ago, no recent ACS type symptoms, no chest pain.  Is on aspirin and Plavix, now on hold given plans for operative repair.  Recently got Plavix within the past 24 hours which may complicate his surgery.  Closely monitor  postop  Essential hypertension -due to soft blood pressure in the setting of pain medication use, hold lisinopril, Maxzide and metoprolol for now  Hyperlipidemia -hold statin use strict n.p.o.  Prior CVA -at baseline per patient and wife   DVT prophylaxis: SCDs Code Status: Full code per patient and wife Family Communication: Wife present at bedside Disposition Plan: Home versus SNF Bed Type: ICU Consults called: General surgery Obs/Inp: Inpatient  At the time of admission, it appears that the appropriate admission status for this patient is INPATIENT as it is expected that patient will require hospital care > 2 midnights. This is judged to be reasonable and necessary in order to provide the required intensity of service to ensure the patient's safety given: presenting symptoms, initial radiographic and laboratory data and in the context of their chronic comorbidities. Together, these circumstances are felt to place patient at high at high risk for further clinical deterioration threatening life, limb, or organ.  Marzetta Board, MD, PhD [facility]  Contact via [email or web address]  10/22/2022, 7:45 PM

## 2022-10-22 NOTE — Anesthesia Preprocedure Evaluation (Addendum)
Anesthesia Evaluation  Patient identified by MRN, date of birth, ID band Patient awake and Patient confused    Reviewed: Allergy & Precautions, H&P , NPO status , Patient's Chart, lab work & pertinent test results, reviewed documented beta blocker date and time   Airway Mallampati: II  TM Distance: >3 FB Neck ROM: full    Dental no notable dental hx.    Pulmonary sleep apnea , former smoker   Pulmonary exam normal breath sounds clear to auscultation       Cardiovascular Exercise Tolerance: Good hypertension, + CAD  + Valvular Problems/Murmurs AS  Rhythm:regular Rate:Normal     Neuro/Psych Seizures -,  PSYCHIATRIC DISORDERS Anxiety     CVA, Residual Symptoms negative neurological ROS  negative psych ROS   GI/Hepatic negative GI ROS, Neg liver ROS,,,  Endo/Other  negative endocrine ROS    Renal/GU negative Renal ROS  negative genitourinary   Musculoskeletal   Abdominal   Peds  Hematology negative hematology ROS (+)   Anesthesia Other Findings   Reproductive/Obstetrics negative OB ROS                             Anesthesia Physical Anesthesia Plan  ASA: 4 and emergent  Anesthesia Plan: General and General ETT   Post-op Pain Management:    Induction:   PONV Risk Score and Plan: Ondansetron  Airway Management Planned:   Additional Equipment:   Intra-op Plan:   Post-operative Plan:   Informed Consent: I have reviewed the patients History and Physical, chart, labs and discussed the procedure including the risks, benefits and alternatives for the proposed anesthesia with the patient or authorized representative who has indicated his/her understanding and acceptance.     Dental Advisory Given  Plan Discussed with: CRNA  Anesthesia Plan Comments: (High likelihood of severe anesthetic complications including possible intraoperative death. Severe AS, CAD, COPD High aspiration  risk)       Anesthesia Quick Evaluation

## 2022-10-22 NOTE — ED Triage Notes (Signed)
Pt with umbilical hernia pain, known hernia.  Pain started yesterday.

## 2022-10-22 NOTE — Anesthesia Procedure Notes (Signed)
Procedure Name: Intubation Date/Time: 10/22/2022 7:57 PM  Performed by: Louann Sjogren, MDPre-anesthesia Checklist: Patient identified, Emergency Drugs available, Suction available, Patient being monitored and Timeout performed Patient Re-evaluated:Patient Re-evaluated prior to induction Oxygen Delivery Method: Circle system utilized Preoxygenation: Pre-oxygenation with 100% oxygen Induction Type: IV induction, Rapid sequence and Cricoid Pressure applied Laryngoscope Size: Glidescope and 3 Grade View: Grade I Tube type: Oral Tube size: 7.0 mm Number of attempts: 1 Airway Equipment and Method: Video-laryngoscopy and Stylet Placement Confirmation: ETT inserted through vocal cords under direct vision, positive ETCO2 and breath sounds checked- equal and bilateral Secured at: 22 cm Tube secured with: Tape Dental Injury: Teeth and Oropharynx as per pre-operative assessment  Comments: No mask ventilation No apparent aspiration Did not use sux - stroke with hemiparesis

## 2022-10-22 NOTE — Anesthesia Postprocedure Evaluation (Signed)
Anesthesia Post Note  Patient: Craig Fields  Procedure(s) Performed: EXPLORATORY LAPAROTOMY, ventral hernia repair, bowel resection OMENTECTOMY  Patient location during evaluation: SICU Anesthesia Type: General Level of consciousness: patient remains intubated per anesthesia plan Pain management: pain level controlled Vital Signs Assessment: post-procedure vital signs reviewed and stable Respiratory status: patient on ventilator - see flowsheet for VS Cardiovascular status: blood pressure returned to baseline and stable Postop Assessment: no apparent nausea or vomiting Anesthetic complications: no   No notable events documented.   Last Vitals:  Vitals:   10/22/22 1605 10/22/22 1845  BP: (!) 121/53 (!) 114/53  Pulse: 79 86  Resp: 16 20  Temp: 36.7 C   SpO2: 100% 98%    Last Pain:  Vitals:   10/22/22 1741  TempSrc:   PainSc: [place]

## 2022-10-22 NOTE — ED Provider Notes (Signed)
[facility] Provider Note   CSN: [id number] Arrival date & time: 10/22/22  1120     History  Chief Complaint  Patient presents with   Abdominal Pain    Craig Fields is a 82 y.o. male.   Abdominal Pain  This patient is an 82 year old male, history of coronary disease status post quadruple bypass, currently on Plavix and aspirin, he takes metoprolol and simvastatin.  He does not take any other anticoagulants.  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    Home Medications Prior to Admission medications   Medication Sig Start Date End Date Taking? Authorizing Provider  acetaminophen (TYLENOL) 650 MG CR tablet Take 1,900 mg by mouth in the morning and at bedtime.    [provider]  aspirin 81 MG EC tablet Take 81 mg by mouth every evening.    [provider]  Cholecalciferol (VITAMIN D) 2000 units CAPS Take 1 capsule by mouth 2 (two) times daily.    [provider]  clopidogrel (PLAVIX) 75 MG tablet TAKE 1 TABLET BY MOUTH EVERY DAY 09/09/22   Fayrene Helper, MD  Coenzyme Q10 (CO Q-10) 200 MG CAPS Take 1 tablet by mouth  daily.     [provider]  KLOR-CON M20 20 MEQ tablet TAKE 2 TABLETS BY MOUTH DAILY 09/09/22   Fayrene Helper, MD  lisinopril (ZESTRIL) 10 MG tablet TAKE 1 TABLET BY MOUTH EVERY DAY 08/19/22   Fayrene Helper, MD  metoprolol tartrate (LOPRESSOR) 50 MG tablet TAKE HALF A TABLET BY MOUTH 2 (TWO) TIMES DAILY. 09/09/22   Fayrene Helper, MD  Multiple Vitamins-Minerals ([employer] SPECTRAVITE) TABS Take 1 tablet by mouth every evening.    [provider]  oxyCODONE-acetaminophen (PERCOCET/ROXICET) 5-325 MG tablet Take one tablet by mouth two times daily, as needed, for severe pain 05/28/22   Fayrene Helper, MD  polyethylene glycol powder (GLYCOLAX/MIRALAX) powder MIX TO TAKE 17 G DAILY AS DIRECTED Patient taking differently: Take 17 g by mouth every morning. 11/26/16   Fayrene Helper, MD  simvastatin (ZOCOR) 20 MG tablet TAKE 1 TABLET BY MOUTH EVERYDAY AT BEDTIME Patient taking differently: Take 20 mg by mouth daily. 11/20/21   Fayrene Helper, MD  triamterene-hydrochlorothiazide ([id number]) 37.5-25 MG tablet TAKE 1 TABLET BY MOUTH EVERY DAY IN THE MORNING 09/09/22   Fayrene Helper, MD  UNABLE TO FIND Commode assist chair 01/02/19   Fayrene Helper, MD  UNABLE TO FIND Bilateral new shoes  Left AFO with calliber plate  Dx [id number]   Perlie Mayo, NP  UNABLE TO FIND Right custom torch walker x 1  Dx [id number] 02/04/20   Perlie Mayo, NP      Allergies    Bee venom and Niacin    Review of Systems   Review of Systems  Gastrointestinal:  Positive for abdominal pain.  All other systems reviewed and are negative.   Physical Exam Updated Vital Signs BP (!) 121/53   Pulse 79   Temp 98 F (36.7 C) (Oral)   Resp 16   Ht 1.778 m (5' 10"$ )   Wt 84.8 kg   SpO2 100%   BMI 26.83 kg/m  Physical Exam Vitals and nursing note reviewed.  Constitutional:      General: He is not in acute distress.    Appearance: He is well-developed.  HENT:      Head: Normocephalic and atraumatic.     Mouth/Throat:     Pharynx: No oropharyngeal exudate.  Eyes:     General: No scleral icterus.       Right eye: No discharge.        Left eye: No discharge.     Conjunctiva/sclera: Conjunctivae normal.     Pupils: Pupils are equal, round, and reactive to light.  Neck:     Thyroid: No thyromegaly.     Vascular: No JVD.  Cardiovascular:     Rate and Rhythm: Normal rate and regular rhythm.     Heart sounds: Normal heart sounds. No murmur heard.    No friction rub. No gallop.  Pulmonary:     Effort: Pulmonary effort is normal. No respiratory distress.     Breath sounds: Normal breath sounds. No wheezing or rales.  Abdominal:     General: Bowel sounds are normal. There is distension.     Palpations: There is no mass.     Tenderness: There is abdominal tenderness.     Hernia: A hernia is present.     Comments: Abdomen is quite distended, there is a large ventral lower abdominal wall hernia which is hard to the touch, tense, tender  Musculoskeletal:        General: No tenderness. Normal range of motion.     Cervical back: Normal range of motion and neck supple.  Lymphadenopathy:     Cervical: No cervical adenopathy.  Skin:    General: Skin is warm and dry.     Findings: No erythema or rash.  Neurological:     Mental Status: He is alert.     Coordination: Coordination normal.  Psychiatric:        Behavior: Behavior normal.     ED Results / Procedures / Treatments   Labs (all labs ordered are listed, but only abnormal results are displayed) Labs Reviewed  LACTIC ACID, PLASMA - Abnormal; Notable for the following components:      Result Value   Lactic Acid, Venous 2.6 (*)    All other components within normal limits  CBC WITH DIFFERENTIAL/PLATELET - Abnormal; Notable for the following components:   WBC 17.2 (*)    RBC 4.04 (*)    Hemoglobin 12.8 (*)    Neutro Abs 15.1 (*)    Monocytes Absolute 1.2 (*)    Abs Immature Granulocytes 0.10  (*)    All other components within normal limits  COMPREHENSIVE METABOLIC PANEL - Abnormal; Notable for the following components:   Glucose, Bld 174 (*)    Calcium 10.4 (*)    All other components within normal limits  LACTIC ACID, PLASMA    EKG None  Radiology CT ABDOMEN PELVIS W CONTRAST  Result Date: 10/22/2022 CLINICAL DATA:  History of colon cancer. EXAM: CT ABDOMEN AND PELVIS WITH CONTRAST TECHNIQUE: Multidetector CT imaging of the abdomen and pelvis was performed using the standard protocol following bolus administration of intravenous contrast. RADIATION DOSE REDUCTION: This exam was performed according to the departmental dose-optimization program which includes automated exposure control, adjustment of the mA and/or kV according to patient size and/or use of iterative reconstruction technique. CONTRAST:  157m OMNIPAQUE IOHEXOL 300 MG/ML  SOLN COMPARISON:  05/11/2018 FINDINGS: Lower chest: Chronic left pleural effusion with pleural calcifications. Hepatobiliary: No focal liver abnormality is seen. No gallstones, gallbladder wall thickening, or biliary dilatation. Pancreas: Unremarkable. No pancreatic ductal dilatation or surrounding inflammatory changes. Spleen: Normal in size without focal abnormality. Adrenals/Urinary Tract: Adrenal glands are unremarkable. No urolithiasis or obstructive uropathy. 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 Stomach/Bowel: Numerous dilated loops of small bowel measuring up to 4.3 cm with multiple air-fluid levels. Ventral abdominal hernia containing numerous dilated loops of small bowel, surrounding soft tissue edema concerning for an incarcerated hernia resulting in small bowel obstruction. Distended stomach with an air-fluid level. Small hiatal hernia. Prior colectomy. Vascular/Lymphatic: Normal caliber abdominal aorta with mild atherosclerosis. No lymphadenopathy.  Reproductive: Enlarged prostate gland. Other: Fluid containing right inguinal hernia. Fat containing left inguinal hernia. No abdominal ascites. Musculoskeletal: No acute osseous abnormality. No aggressive osseous lesion. IMPRESSION: 1. Ventral abdominal hernia containing numerous dilated loops of small bowel, surrounding soft tissue edema concerning for an incarcerated hernia resulting in small bowel obstruction. 2. 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 3. Chronic left pleural effusion with pleural calcifications. 4.  Aortic Atherosclerosis (ICD10-I70.0). Electronically Signed   By: HKathreen DevoidM.D.   On: 10/22/2022 17:43    Procedures .Critical Care  Performed by: MNoemi Chapel MD Authorized by: MNoemi Chapel MD   Critical care provider statement:    Critical care time (minutes):  45   Critical care time was exclusive of:  Separately billable procedures and treating other patients and teaching time   Critical care was necessary to treat or prevent imminent or life-threatening deterioration of the following conditions: Ischemic bowel and incarcerated hernia.   Critical care was time spent personally by me on the following activities:  Development of treatment plan with patient or surrogate, discussions with consultants, evaluation of patient's response to treatment, examination of patient, obtaining history from patient or surrogate, review of old charts, re-evaluation of patient's condition, pulse oximetry, ordering and review of radiographic studies, ordering and review of laboratory studies and ordering and performing treatments and interventions   Care discussed with: admitting provider       Medications Ordered in ED Medications  sodium chloride 0.9 % bolus 1,000 mL (has no administration in time range)  Ampicillin-Sulbactam (UNASYN) 3 g in sodium chloride 0.9 % 100 mL IVPB (has no administration in  time range)  HYDROmorphone (DILAUDID) injection 0.5 mg (0.5 mg Intravenous Given 10/22/22 1622)  sodium chloride 0.9 % bolus 1,000 mL (1,000 mLs Intravenous New Bag/Given 10/22/22 1624)  ondansetron (ZOFRAN) injection 4 mg (4 mg Intravenous Given 10/22/22 1622)  iohexol (OMNIPAQUE) 300 MG/ML solution 100 mL (100 mLs Intravenous Contrast Given 10/22/22 1724)    ED Course/ Medical Decision Making/ A&P  Medical Decision Making Amount and/or Complexity of Data Reviewed Labs: ordered. Radiology: ordered. ECG/medicine tests: ordered.  Risk Prescription drug management. Decision regarding hospitalization.   This patient presents to the ED for concern of abdominal pain with likely serrated hernia, this involves an extensive number of treatment options, and is a complaint that carries with it a high risk of complications and morbidity.  The differential diagnosis includes encouraged rhytid or strangulated hernia, could be an intra-abdominal process such as enteritis or colitis, could have some ischemic bowel, the patient generally appears to be uncomfortable and is holding his abdomen in a quite a tender way   Co morbidities that complicate the patient evaluation  Longtime hernia, hypertension, heart disease on antiplatelets   Additional history obtained:  Additional history obtained from electronic medical record External records from outside source obtained and reviewed including prior records, see HPI including prior imaging   Lab Tests:  I Ordered, and personally interpreted labs.  The pertinent results include: Lactic acid of 2.6, white blood cell count of 0000000, metabolic panel showing mild hyperglycemia.   Imaging Studies ordered:  I ordered imaging studies including CT scan of the abdomen and pelvis I independently visualized and interpreted imaging which showed multiple air-fluid levels in the bowel secondary to what appears to be an incarcerated  hernia, this involves multiple dilated loops of small bowel.  There is also a subacute subcapsular fluid collection that is likely a chronic subcapsular hematoma over the left kidney. I agree with the radiologist interpretation   Cardiac Monitoring: / EKG:  The patient was maintained on a cardiac monitor.  I personally viewed and interpreted the cardiac monitored which showed an underlying rhythm of: Normal sinus rhythm, hypotension responding to IV fluids   Consultations Obtained:  I requested consultation with the general surgeon Dr. Okey Dupre as well as the hospitalist,  and discussed lab and imaging findings as well as pertinent plan - they recommend: Admission to the hospital service, will go to surgery tonight for immediate fix of this patient's hernia   Problem List / ED Course / Critical interventions / Medication management  Antibiotics, pain medications, IV fluids I ordered medication including Unasyn, hydromorphone, IV fluids for hypotension Reevaluation of the patient after these medicines showed that the patient improved but still appears critically ill I have reviewed the patients home medicines and have made adjustments as needed   Social Determinants of Health:  Chronic hernia   Test / Admission - Considered:  Will need to be admitted, the patient is critically ill secondary to what appears to be hypotension, elevated lactic acid which I suspect is related to ischemic bowel and not to sepsis.  This will need to be surgically fixed otherwise this will continue on to become an ischemic bowel and lead to potential serious outcomes even morbidity or mortality         Final Clinical Impression(s) / ED Diagnoses Final diagnoses:  Incarcerated hernia    Rx / DC Orders ED Discharge Orders     None         Noemi Chapel, MD 10/22/22 1812

## 2022-10-22 NOTE — Progress Notes (Signed)
[facility]  I spoke with the patient's wife and family in the waiting room.  I explained that he tolerated the procedure better than we expected.  He had a strangulated ventral hernia, which required small bowel resection, as some of his bowel was noted to be ischemic and necrotic.  I primarily repaired his ventral hernia given the need for small bowel resection.  He will remain intubated and be transferred up to the ICU for close monitoring.  He will also have an NG tube in place until he has return of bowel function.  I also explained that prior to any medication administration, he had an episode of emesis, so we will need to monitor him for development of pneumonia from a possible aspiration.  All questions were answered to their expressed satisfaction.  Plan: -Patient to be transferred directly to ICU -Appreciate respiratory therapy and hospitalist recommendations with ventilator management -Chest x-ray ordered to evaluate location of ET tube -Ofirmev scheduled, as needed fentanyl pushes -Propofol infusion ordered for sedation -NG to LIS -NPO -Monitor for return of bowel function -Dulcolax suppositories -Maintain Foley while intubated -No need for antibiotics from surgical standpoint -Appreciate hospitalist recommendations  Graciella Freer, DO [facility] [address] [place], [place] [address] [phone] (office)

## 2022-10-22 NOTE — Progress Notes (Signed)
eLink Physician-Brief Progress Note Patient Name: Craig Fields DOB: 1941/01/01 MRN: [id number]   Date of Service  10/22/2022  HPI/Events of Note  82 y.o. male with medical history significant of recurrent ventral hernia, CAD status post CABG, prior CVA, on chronic aspirin and Plavix, HTN, HLD presented with abdominal pain and was found to have a strangulated/incarcerated abdominal hernia with necrotic small bowel.  Patient is postoperative resection and now in the ICU.  Sedated with propofol, analgesia with fentanyl, ET tube 5 cm above the carina.  No antibiotics indicated per surgery.  Strict n.p.o. per surgery.  Maintain Foley per surgical recommendations.  eICU Interventions  No acute interventions are indicated at this time     Intervention Category [place] Interventions: Clinical assessment - ordering diagnostic tests Evaluation Type: New Patient Evaluation  Constantine Ruddick 10/22/2022, 11:34 PM

## 2022-10-22 NOTE — Consult Note (Addendum)
[facility] Consult  Reason for Consult: Incarcerated ventral hernia causing small bowel obstruction Referring Physician: Dr. Sabra Heck  Chief Complaint   Abdominal Pain     HPI: Craig Fields is a 82 y.o. male who presents to the hospital with a 1 day history of incarcerated ventral hernia with associated tenderness, nausea, and vomiting.  He has had this hernia for several years, and normally it will occasionally get stuck out, but then it goes down and softens on its own.  Starting yesterday, the hernia failed to decrease in size or reduce with lying down.  His last bowel movement was yesterday, and he last passed flatus 2 days ago.  He confirms nausea with a couple of episodes of emesis.  He has had issues with his hernia for several years, and was going to follow-up with a general surgeon, but failed to do so.  He has a past medical history significant for coronary artery disease status post quadruple bypass on Plavix and aspirin, history of CVA with left-sided hemiparesis, hypertension, hyperlipidemia, and history of colon cancer.  He last took his Plavix and aspirin this morning.  His surgical history is significant for an umbilical hernia repair in 2014, left inguinal hernia repair, and a colon resection in 2007 for his colon cancer.    In the ED, he underwent a CT abdomen and pelvis which is demonstrating a ventral hernia containing dilated loops of small bowel causing a small bowel obstruction, and some surrounding soft tissue edema.  He has a leukocytosis of 17.2, and lactic acid was 2.6 and then 2.3.  Past Medical History:  Diagnosis Date   Anxiety disorder    Chronic back pain    Colon cancer ([place]) 01/2006   Coronary atherosclerosis of native coronary artery    Multivessel status post CABG   CVA, old, hemiparesis ([place]) 01/2002   Left side    Essential hypertension    Hematuria 04/30/2019   New complaint, does nott have pain of kidney stones, needs to submit UA  for testing asap and needs urology eval   Hyperlipidemia    IGT (impaired glucose tolerance)    Obesity    OSA (obstructive sleep apnea)    Seizures ([place])    Age 27 or 3, no meds and no reoccurances   Skin lesion 12/07/2012   Skin tag 11/16/2011   Stroke [facility])    Umbilical hernia     Past Surgical History:  Procedure Laterality Date   COLONOSCOPY N/A 07/15/2015   Procedure: COLONOSCOPY;  Surgeon: Aviva Signs, MD;  Location: [facility] ENDO SUITE;  Service: Gastroenterology;  Laterality: N/A;   CORONARY ARTERY BYPASS GRAFT  09/13/2002   x4   HERNIA REPAIR     INCISIONAL HERNIA REPAIR N/A 01/01/2013   Procedure: HERNIA REPAIR INCISIONAL;  Surgeon: Jamesetta So, MD;  Location: [facility] ORS;  Service: General;  Laterality: N/A;  umbilical area   INSERTION OF MESH N/A 01/01/2013   Procedure: INSERTION OF MESH;  Surgeon: Jamesetta So, MD;  Location: [facility] ORS;  Service: General;  Laterality: N/A;   Left inguinal  hernia repair  09/14/2003   [name]  09/13/2005    Family History  Problem Relation Age of Onset   Pancreatic cancer Mother    Heart disease Father    Prostate cancer Father    Stroke Brother    Heart disease Brother    Heart disease Brother    COPD Sister    Actinic keratosis Sister  Obesity Son    Anxiety disorder Son     Social History   Tobacco Use   Smoking status: Former    Packs/day: 0.50    Years: 5.00    Total pack years: 2.50    Types: Cigarettes    Quit date: 12/27/1980    Years since quitting: 41.8   Smokeless tobacco: Former    Types: Chew    Quit date: 01/24/2013  Vaping Use   Vaping Use: Never used  Substance Use Topics   Alcohol use: No    Alcohol/week: 0.0 standard drinks of alcohol   Drug use: No    Medications: I have reviewed the patient's current medications.  Allergies  Allergen Reactions   Bee Venom Swelling   Niacin Other (See Comments)    Muscle aches     ROS:  Pertinent items are noted in HPI.  Blood pressure (!)  121/53, pulse 79, temperature 98 F (36.7 C), temperature source Oral, resp. rate 16, height 5' 10"$  (1.778 m), weight 84.8 kg, SpO2 100 %. Physical Exam Vitals reviewed.  Constitutional:      General: He is not in acute distress.    Appearance: He is obese.     Comments: Somnolent  HENT:     Head: Normocephalic and atraumatic.  Cardiovascular:     Rate and Rhythm: Normal rate.  Pulmonary:     Effort: Pulmonary effort is normal.  Abdominal:     Comments: Abdomen soft, mild distention, no percussion tenderness, tenderness to palpation along ventral hernia and in the lower abdomen; no rigidity, guarding, rebound tenderness; large midline ventral hernia that is hard and nonreducible, tender to palpation, no overlying skin changes; numerous abdominal cicatrix noted  Skin:    General: Skin is warm and dry.     Results: Results for orders placed or performed during the hospital encounter of 10/22/22 (from the past 48 hour(s))  Lactic acid, plasma     Status: Abnormal   Collection Time: 10/22/22  4:17 PM  Result Value Ref Range   Lactic Acid, Venous 2.6 (HH) 0.5 - 1.9 mmol/L    Comment: CRITICAL RESULT CALLED TO, READ BACK BY AND VERIFIED WITH: DANNER,A ON 10/22/22 AT 1720 BY LOY,C Performed at [facility], [address] [place], [place] [address]   CBC with Differential     Status: Abnormal   Collection Time: 10/22/22  4:17 PM  Result Value Ref Range   WBC 17.2 (H) 4.0 - 10.5 K/uL   RBC 4.04 (L) 4.22 - 5.81 MIL/uL   Hemoglobin 12.8 (L) 13.0 - 17.0 g/dL   HCT 39.7 39.0 - 52.0 %   MCV 98.3 80.0 - 100.0 fL   MCH 31.7 26.0 - 34.0 pg   MCHC 32.2 30.0 - 36.0 g/dL   RDW 13.5 11.5 - 15.5 %   Platelets 194 150 - 400 K/uL   nRBC 0.0 0.0 - 0.2 %   Neutrophils Relative % 88 %   Neutro Abs 15.1 (H) 1.7 - 7.7 K/uL   Lymphocytes Relative 4 %   Lymphs Abs 0.8 0.7 - 4.0 K/uL   Monocytes Relative 7 %   Monocytes Absolute 1.2 (H) 0.1 - 1.0 K/uL   Eosinophils Relative 0 %   Eosinophils  Absolute 0.0 0.0 - 0.5 K/uL   Basophils Relative 0 %   Basophils Absolute 0.0 0.0 - 0.1 K/uL   Immature Granulocytes 1 %   Abs Immature Granulocytes 0.10 (H) 0.00 - 0.07 K/uL    Comment: Performed  at [facility], [address], [place], [place] [address]  Comprehensive metabolic panel     Status: Abnormal   Collection Time: 10/22/22  4:17 PM  Result Value Ref Range   Sodium 139 135 - 145 mmol/L   Potassium 4.4 3.5 - 5.1 mmol/L   Chloride 99 98 - 111 mmol/L   CO2 28 22 - 32 mmol/L   Glucose, Bld 174 (H) 70 - 99 mg/dL    Comment: Glucose reference range applies only to samples taken after fasting for at least 8 hours.   BUN 23 8 - 23 mg/dL   Creatinine, Ser 1.08 0.61 - 1.24 mg/dL   Calcium 10.4 (H) 8.9 - 10.3 mg/dL   Total Protein 7.7 6.5 - 8.1 g/dL   Albumin 4.2 3.5 - 5.0 g/dL   AST 28 15 - 41 U/L   ALT 15 0 - 44 U/L   Alkaline Phosphatase 39 38 - 126 U/L   Total Bilirubin 1.2 0.3 - 1.2 mg/dL   GFR, Estimated >60 >60 mL/min    Comment: (NOTE) Calculated using the CKD-EPI Creatinine Equation (2021)    Anion gap 12 5 - 15    Comment: Performed at [facility], [address], [place], [place] [address]  Lactic acid, plasma     Status: Abnormal   Collection Time: 10/22/22  5:49 PM  Result Value Ref Range   Lactic Acid, Venous 2.3 (HH) 0.5 - 1.9 mmol/L    Comment: CRITICAL VALUE NOTED.  VALUE IS CONSISTENT WITH PREVIOUSLY REPORTED AND CALLED VALUE. Performed at [facility], [address], [place], [place] [address]     CT ABDOMEN PELVIS W CONTRAST  Result Date: 10/22/2022 CLINICAL DATA:  History of colon cancer. EXAM: CT ABDOMEN AND PELVIS WITH CONTRAST TECHNIQUE: Multidetector CT imaging of the abdomen and pelvis was performed using the standard protocol following bolus administration of intravenous contrast. RADIATION DOSE REDUCTION: This exam was performed according to the departmental dose-optimization program which includes automated exposure control, adjustment of the mA  and/or kV according to patient size and/or use of iterative reconstruction technique. CONTRAST:  115m OMNIPAQUE IOHEXOL 300 MG/ML  SOLN COMPARISON:  05/11/2018 FINDINGS: Lower chest: Chronic left pleural effusion with pleural calcifications. Hepatobiliary: No focal liver abnormality is seen. No gallstones, gallbladder wall thickening, or biliary dilatation. Pancreas: Unremarkable. No pancreatic ductal dilatation or surrounding inflammatory changes. Spleen: Normal in size without focal abnormality. Adrenals/Urinary Tract: Adrenal glands are unremarkable. No urolithiasis or obstructive uropathy. 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 Stomach/Bowel: Numerous dilated loops of small bowel measuring up to 4.3 cm with multiple air-fluid levels. Ventral abdominal hernia containing numerous dilated loops of small bowel, surrounding soft tissue edema concerning for an incarcerated hernia resulting in small bowel obstruction. Distended stomach with an air-fluid level. Small hiatal hernia. Prior colectomy. Vascular/Lymphatic: Normal caliber abdominal aorta with mild atherosclerosis. No lymphadenopathy. Reproductive: Enlarged prostate gland. Other: Fluid containing right inguinal hernia. Fat containing left inguinal hernia. No abdominal ascites. Musculoskeletal: No acute osseous abnormality. No aggressive osseous lesion. IMPRESSION: 1. Ventral abdominal hernia containing numerous dilated loops of small bowel, surrounding soft tissue edema concerning for an incarcerated hernia resulting in small bowel obstruction. 2. 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 3. Chronic left pleural effusion with pleural calcifications. 4.  Aortic Atherosclerosis (ICD10-I70.0). Electronically Signed   By: HKathreen DevoidM.D.   On:  10/22/2022 17:43     Assessment & Plan:   Craig Fields is a 82 y.o. male who presents with a 1 day history of incarcerated ventral hernia with associated abdominal pain, nausea, and vomiting.  CT abdomen and pelvis demonstrates an incarcerated ventral hernia causing small bowel obstruction.  -I explained the pathophysiology of ventral hernias to the patient and his wife.  I further explained that given the inflammatory changes within the hernia and the resulting small bowel obstruction, he needs to undergo operative intervention. -I explained that given his significant comorbidities, my goal of the surgery is to reduce his small intestine and primarily repair his hernia.  Given that he took his Plavix this morning, he is at significantly increased risk for a bleeding complication.   -Dr. Briant Cedar and myself also had a discussion with the patient's family that he is at high risk for significant complication such as death given his significant medical comorbidities -The risk and benefits of exploratory laparotomy, ventral hernia repair, possible small bowel resection were discussed including but not limited to bleeding, infection, injury to surrounding structures, need for additional procedures, hernia recurrence, poor wound healing, heart attack, respiratory failure, prolonged intubation, stroke, and death.  After careful consideration, Trevaun Chrisman Denise has decided to proceed with surgery.  Patient's wife signed the consent form for the patient, as he is somnolent at this time -Patient will be admitted to the hospitalist service given his numerous medical comorbidities -NG tube ordered -NPO -IVF per primary team -Prophylactic Cefotan ordered -Further recommendations to follow surgery  All questions were answered to the satisfaction of the patient and family.  -- Graciella Freer, DO [facility] [address], [place] [address] [phone] (office)

## 2022-10-22 NOTE — Op Note (Incomplete)
[facility] Operative Note  10/22/22  Preoperative Diagnosis: Incarcerated ventral hernia causing small bowel obstruction   Postoperative Diagnosis: Strangulated ventral hernia causing small bowel obstruction, necrotic small bowel   Procedure(s) Performed: Exploratory laparotomy, primary ventral hernia repair, small bowel resection, partial omentectomy   Surgeon: Graciella Freer, DO    Assistants: No qualified resident was available    Anesthesia: General endotracheal   Anesthesiologist: Dr. Briant Cedar   Specimens: Small bowel, omentum and hernia sac   Estimated Blood Loss: 50 cc   Blood Replacement: None    Complications: None   Wound Class: Dirty/infected   Operative Indications: Patient is an 82 year old male who presents with a 24 hour history of incarcerated ventral hernia, abdominal pain, nausea, and vomiting.  He was noted to have an incarcerated ventral hernia causing small bowel obstruction on CT scan with surrounding soft tissue edema.  He had a leukocytosis of 17.2 and elevated lactic acid of 2.6.  Decision was made to take the patient to the operating room for ventral hernia repair.  Patient and his wife were agreeable to surgery.  All risks and benefits of performing this procedure were discussed with the patient including pain, infection, bleeding, damage to the surrounding structures, and need for more procedures or surgery. The patient and the patient's wife voiced understanding of the procedure, all questions were sought and answered, and consent was obtained.  Findings: Multiloculated, strangulated ventral hernia measuring 12 cm in length, containing necrotic small bowel (roughly 80 cm of small bowel resected), omentum intimately adhered to hernia sac   Procedure: The patient was taken to the operating room and placed supine. General endotracheal anesthesia was induced. Intravenous antibiotics were administered per protocol.  An nasogastric tube  positioned to decompress the stomach. The abdomen was prepared and draped in the usual sterile fashion. A time-out was completed verifying correct patient, procedure, site, positioning, and implant(s) and/or special equipment prior to beginning this procedure.  A vertical midline incision was made overlying the midline ventral hernia.  The incision was deepened through the subcutaneous tissues, and hemostasis was achieved with electrocautery.  Part of the hernia sac was identified and dissected from the subcutaneous tissues.  The linea alba was identified just superior to the hernia sac and incised, and the peritoneal cavity was entered.  The hernia sac was then entered.  The small intestine within the hernia sac was noted to be black and necrotic.  Omental adhesions to the anterior abdominal wall and to the hernia sac were taken down using LigaSure.  The hernia was multiloculated, and right lateral loculation contained necrotic small bowel that was adhesed to the hernia sac.  The hernia sac was dissected from the subcutaneous tissues, so that the small bowel was able to be released.  The segment of nonviable small bowel measured 80 cm long.  Viable margins proximal and distal to the segment of necrotic bowel were determined and divided with a linear cutting staplers.  The mesentery was taken down using LigaSure.  The small bowel was sent to pathology for evaluation.  Enterotomies were created in the antimesenteric angles of the proximal and distal segments, and a linear cutting stapler was inserted and fired.  The lumen was inspected for hemostasis.  There was no evidence of ischemia to the mucosa.  The enterotomies were then closed with a 60 mm TA stapler.  The staple line was then imbricated with 2-0 silk in a Lembert fashion.  The mesenteric defect was closed with 2-0  chromic in a running, locking fashion.  The small bowel was run from the ligament of Treitz down to the ileocecal valve.  There were no other  areas of ischemia noted.  The contents of the proximal dilated loops of small bowel were milked back towards the stomach.  NG tube was verified to be in appropriate position within the stomach.  The abdomen was irrigated with warm normal saline.  Hemostasis was noted.    Attention was then turned to the anterior abdominal wall.  Omentum was noted to be adhesed within part of the loculated hernia sac.  The omentum was removed with part of the hernia sac and sent to pathology for evaluation.  The hernia sac was dissected off enough to expose the fascia for closure.  Given the need for small bowel resection, decision was made to perform a primary closure of the fascia.  The final hernia length measured 12 cm.  After the sponge, needle, and instrument counts were noted to be correct, the fascia was closed with a running 0 PDS from both the superior and inferior aspects of the incision.  Hemostasis was noted in the subcutaneous tissues.  Exparel was injected.  Part of the subcutaneous tissue at the inferior aspect of the incision was sutured down to the abdominal wall with 3-0 Vicryl, to decrease the size of the potential space.  The skin was closed with skin staples.  The incision was dressed with a honeycomb dressing.  Final inspection revealed acceptable hemostasis. All counts were correct at the end of the case. The patient was transferred directly to the ICU intubated.  Patient tolerated the procedure and did not requiring vasopressors during the case.   Graciella Freer, DO  [facility] [address] [place], [place] [address] [phone] (office)

## 2022-10-22 NOTE — Telephone Encounter (Signed)
Patients wife called and states that he is hurting really bad with his unbiblical hernia and wants to see Dr. Arnoldo Morale. She states that he can not get conformable d/t the pain. Recommended that she take him to the ER if his pain is that bad as we do not have a provider in office today but we do have one on call but he will have to go to the ER first. She does not want to go and sit for hours at the ER for them to do nothing. I suggested that she could call his PCP and get them to look at it and if he needed to go to ER they could also recommend that as well. She verbalized understanding.

## 2022-10-22 NOTE — Transfer of Care (Signed)
Immediate Anesthesia Transfer of Care Note  Patient: Craig Fields  Procedure(s) Performed: EXPLORATORY LAPAROTOMY, ventral hernia repair, bowel resection OMENTECTOMY  Patient Location: SICU  Anesthesia Type:General  Level of Consciousness: Patient remains intubated per anesthesia plan  Airway & Oxygen Therapy: Patient remains intubated per anesthesia plan and Patient placed on Ventilator (see vital sign flow sheet for setting)  Post-op Assessment: Report given to RN and Post -op Vital signs reviewed and stable  Post vital signs: Reviewed and stable  Last Vitals:  Vitals Value Taken Time  BP 110/61   Temp 98   Pulse 91   Resp VENT   SpO2 100     Last Pain:  Vitals:   10/22/22 1741  TempSrc:   PainSc: 3          Complications: No notable events documented.

## 2022-10-22 NOTE — ED Notes (Signed)
Take extra precautions with established IV access.

## 2022-10-22 NOTE — Brief Op Note (Signed)
10/22/2022  10:33 PM  PATIENT:  Craig Fields  82 y.o. male  PRE-OPERATIVE DIAGNOSIS:  incarcerated ventral hernia causing small bowel obstruction  POST-OPERATIVE DIAGNOSIS:  strangulated ventral hernia causing small bowel obstruction, necrotic small bowel  PROCEDURE:  Procedure(s): EXPLORATORY LAPAROTOMY, ventral hernia repair, bowel resection (N/A) OMENTECTOMY  SURGEON:  Surgeon(s) and Role:    * Makenzie Weisner A, DO - Primary  PHYSICIAN ASSISTANT: NONE   ASSISTANTS: none   ANESTHESIA:   general  EBL:  50 mL   BLOOD ADMINISTERED:none  DRAINS: none   LOCAL MEDICATIONS USED:  BUPIVICAINE   SPECIMEN:  Source of Specimen:  small bowel, hernia sac and omentum  DISPOSITION OF SPECIMEN:  PATHOLOGY  COUNTS:  YES  DICTATION: .Note written in EPIC  PLAN OF CARE: Admit to inpatient   PATIENT DISPOSITION:  ICU - intubated and critically ill.   Delay start of Pharmacological VTE agent (>24hrs) due to surgical blood loss or risk of bleeding: yes  Graciella Freer, DO [facility] [place], [place] [address] [phone] (office)

## 2022-10-23 ENCOUNTER — Inpatient Hospital Stay: Payer: Self-pay

## 2022-10-23 ENCOUNTER — Encounter (HOSPITAL_COMMUNITY): Payer: Self-pay

## 2022-10-23 ENCOUNTER — Inpatient Hospital Stay (HOSPITAL_COMMUNITY): Payer: Medicare Other

## 2022-10-23 DIAGNOSIS — K460 Unspecified abdominal hernia with obstruction, without gangrene: Secondary | ICD-10-CM

## 2022-10-23 DIAGNOSIS — R0989 Other specified symptoms and signs involving the circulatory and respiratory systems: Secondary | ICD-10-CM | POA: Diagnosis not present

## 2022-10-23 LAB — CBC
HCT: 35.2 % — ABNORMAL LOW (ref 39.0–52.0)
Hemoglobin: 11.4 g/dL — ABNORMAL LOW (ref 13.0–17.0)
MCH: 32 pg (ref 26.0–34.0)
MCHC: 32.4 g/dL (ref 30.0–36.0)
MCV: 98.9 fL (ref 80.0–100.0)
Platelets: 196 10*3/uL (ref 150–400)
RBC: 3.56 MIL/uL — ABNORMAL LOW (ref 4.22–5.81)
RDW: 13.7 % (ref 11.5–15.5)
WBC: 4.7 10*3/uL (ref 4.0–10.5)
nRBC: 0 % (ref 0.0–0.2)

## 2022-10-23 LAB — GLUCOSE, CAPILLARY
Glucose-Capillary: 122 mg/dL — ABNORMAL HIGH (ref 70–99)
Glucose-Capillary: 132 mg/dL — ABNORMAL HIGH (ref 70–99)
Glucose-Capillary: 149 mg/dL — ABNORMAL HIGH (ref 70–99)
Glucose-Capillary: 173 mg/dL — ABNORMAL HIGH (ref 70–99)

## 2022-10-23 LAB — TRIGLYCERIDES: Triglycerides: 56 mg/dL (ref <150)

## 2022-10-23 LAB — STREP PNEUMONIAE URINARY ANTIGEN: Strep Pneumo Urinary Antigen: NEGATIVE

## 2022-10-23 LAB — COMPREHENSIVE METABOLIC PANEL
ALT: 11 U/L (ref 0–44)
AST: 33 U/L (ref 15–41)
Albumin: 2.9 g/dL — ABNORMAL LOW (ref 3.5–5.0)
Alkaline Phosphatase: 26 U/L — ABNORMAL LOW (ref 38–126)
Anion gap: 10 (ref 5–15)
BUN: 25 mg/dL — ABNORMAL HIGH (ref 8–23)
CO2: 24 mmol/L (ref 22–32)
Calcium: 8.9 mg/dL (ref 8.9–10.3)
Chloride: 105 mmol/L (ref 98–111)
Creatinine, Ser: 1.08 mg/dL (ref 0.61–1.24)
GFR, Estimated: >60 mL/min (ref >60)
Glucose, Bld: 124 mg/dL — ABNORMAL HIGH (ref 70–99)
Potassium: 3.8 mmol/L (ref 3.5–5.1)
Sodium: 139 mmol/L (ref 135–145)
Total Bilirubin: 1 mg/dL (ref 0.3–1.2)
Total Protein: 5.6 g/dL — ABNORMAL LOW (ref 6.5–8.1)

## 2022-10-23 LAB — TROPONIN I (HIGH SENSITIVITY)
Troponin I (High Sensitivity): 1169 ng/L (ref <18)
Troponin I (High Sensitivity): 1314 ng/L (ref <18)

## 2022-10-23 LAB — MAGNESIUM: Magnesium: 1.5 mg/dL — ABNORMAL LOW (ref 1.7–2.4)

## 2022-10-23 LAB — LACTIC ACID, PLASMA
Lactic Acid, Venous: 2.9 mmol/L (ref 0.5–1.9)
Lactic Acid, Venous: 2.2 mmol/L (ref 0.5–1.9)
Lactic Acid, Venous: 1.8 mmol/L (ref 0.5–1.9)

## 2022-10-23 LAB — PHOSPHORUS: Phosphorus: 2.7 mg/dL (ref 2.5–4.6)

## 2022-10-23 LAB — PROCALCITONIN: Procalcitonin: 3.68 ng/mL

## 2022-10-23 LAB — MRSA NEXT GEN BY PCR, NASAL: MRSA by PCR Next Gen: NOT DETECTED

## 2022-10-23 MED ORDER — MIDAZOLAM BOLUS VIA INFUSION: 0.0000 mg | INTRAVENOUS | Status: DC | PRN

## 2022-10-23 MED ORDER — VANCOMYCIN HCL 1500 MG/300ML IV SOLN
1500.0000 mg | INTRAVENOUS | Status: DC
  Filled 2022-10-23: qty 300

## 2022-10-23 MED ORDER — IPRATROPIUM-ALBUTEROL 0.5-2.5 (3) MG/3ML IN SOLN
3.0000 mL | Freq: Four times a day (QID) | RESPIRATORY_TRACT | Status: DC | PRN
  Administered 2022-10-23: 3 mL via RESPIRATORY_TRACT
  Filled 2022-10-23: qty 3

## 2022-10-23 MED ORDER — SODIUM CHLORIDE 0.9 % IV BOLUS
1000.0000 mL | Freq: Once | INTRAVENOUS | Status: AC
  Administered 2022-10-23: 1000 mL via INTRAVENOUS

## 2022-10-23 MED ORDER — SODIUM CHLORIDE 0.9 % IV SOLN
2.0000 g | Freq: Two times a day (BID) | INTRAVENOUS | Status: DC
  Administered 2022-10-23 (×2): 2 g via INTRAVENOUS
  Filled 2022-10-23 (×2): qty 12.5

## 2022-10-23 MED ORDER — CHLORHEXIDINE GLUCONATE CLOTH 2 % EX PADS
6.0000 | MEDICATED_PAD | Freq: Every day | CUTANEOUS | Status: DC
  Administered 2022-10-23 – 2022-11-01 (×11): 6 via TOPICAL

## 2022-10-23 MED ORDER — SODIUM CHLORIDE 0.9 % IV BOLUS
500.0000 mL | Freq: Once | INTRAVENOUS | Status: AC
  Administered 2022-10-23: 500 mL via INTRAVENOUS

## 2022-10-23 MED ORDER — VASOPRESSIN 20 UNITS/100 ML INFUSION FOR SHOCK
0.0000 [IU]/min | INTRAVENOUS | Status: DC
  Administered 2022-10-23: 0.03 [IU]/min via INTRAVENOUS
  Filled 2022-10-23: qty 100

## 2022-10-23 MED ORDER — MIDAZOLAM HCL 2 MG/2ML IJ SOLN: 2.0000 mg | Freq: Once | INTRAMUSCULAR | Status: AC

## 2022-10-23 MED ORDER — VASOPRESSIN 20 UNITS/100 ML INFUSION FOR SHOCK: 0.0000 [IU]/min | INTRAVENOUS | Status: DC

## 2022-10-23 MED ORDER — MIDAZOLAM HCL 2 MG/2ML IJ SOLN: 1.0000 mg | INTRAMUSCULAR | Status: DC | PRN

## 2022-10-23 MED ORDER — PANTOPRAZOLE SODIUM 40 MG IV SOLR
40.0000 mg | INTRAVENOUS | Status: DC
  Administered 2022-10-24 – 2022-10-31 (×8): 40 mg via INTRAVENOUS
  Filled 2022-10-23 (×8): qty 10

## 2022-10-23 MED ORDER — SODIUM CHLORIDE 0.9 % IV SOLN
2.0000 g | INTRAVENOUS | Status: DC
  Filled 2022-10-23: qty 2

## 2022-10-23 MED ORDER — NOREPINEPHRINE 4 MG/250ML-% IV SOLN
2.0000 ug/min | INTRAVENOUS | Status: DC
  Administered 2022-10-23: 10 ug/min via INTRAVENOUS
  Administered 2022-10-23: 2 ug/min via INTRAVENOUS
  Filled 2022-10-23 (×3): qty 250

## 2022-10-23 MED ORDER — FENTANYL 2500MCG IN NS 250ML (10MCG/ML) PREMIX INFUSION
0.0000 ug/h | INTRAVENOUS | Status: DC
  Administered 2022-10-23: 25 ug/h via INTRAVENOUS
  Filled 2022-10-23: qty 250

## 2022-10-23 MED ORDER — ORAL CARE MOUTH RINSE: 15.0000 mL | OROMUCOSAL | Status: DC | PRN

## 2022-10-23 MED ORDER — ORAL CARE MOUTH RINSE
15.0000 mL | OROMUCOSAL | Status: DC
  Administered 2022-10-23 – 2022-10-29 (×64): 15 mL via OROMUCOSAL

## 2022-10-23 MED ORDER — SODIUM CHLORIDE 0.9 % IV BOLUS
2000.0000 mL | Freq: Once | INTRAVENOUS | Status: AC
  Administered 2022-10-23: 2000 mL via INTRAVENOUS

## 2022-10-23 MED ORDER — MIDAZOLAM HCL (PF) 10 MG/2ML IJ SOLN
INTRAMUSCULAR | Status: AC
  Administered 2022-10-23: 1 mg
  Filled 2022-10-23: qty 2

## 2022-10-23 MED ORDER — SODIUM CHLORIDE 0.9 % IV SOLN: INTRAVENOUS | Status: DC | PRN

## 2022-10-23 MED ORDER — VASOPRESSIN 20 UNITS/100 ML INFUSION FOR SHOCK
INTRAVENOUS | Status: AC
  Filled 2022-10-23: qty 100

## 2022-10-23 MED ORDER — MIDAZOLAM HCL 2 MG/2ML IJ SOLN
2.0000 mg | Freq: Once | INTRAMUSCULAR | Status: AC
  Administered 2022-10-23: 2 mg via INTRAVENOUS
  Filled 2022-10-23: qty 2

## 2022-10-23 MED ORDER — VANCOMYCIN HCL 2000 MG/400ML IV SOLN
2000.0000 mg | Freq: Once | INTRAVENOUS | Status: AC
  Administered 2022-10-23: 2000 mg via INTRAVENOUS
  Filled 2022-10-23: qty 400

## 2022-10-23 MED ORDER — HEPARIN SODIUM (PORCINE) 5000 UNIT/ML IJ SOLN
5000.0000 [IU] | Freq: Three times a day (TID) | INTRAMUSCULAR | Status: DC
  Administered 2022-10-23 – 2022-10-26 (×8): 5000 [IU] via SUBCUTANEOUS
  Filled 2022-10-23 (×8): qty 1

## 2022-10-23 MED ORDER — MIDAZOLAM HCL 2 MG/2ML IJ SOLN
2.0000 mg | INTRAMUSCULAR | Status: DC | PRN
  Administered 2022-10-23: 2 mg via INTRAVENOUS
  Filled 2022-10-23: qty 2

## 2022-10-23 MED ORDER — CHLORHEXIDINE GLUCONATE CLOTH 2 % EX PADS
6.0000 | MEDICATED_PAD | Freq: Once | CUTANEOUS | Status: AC
  Administered 2022-10-23: 6 via TOPICAL

## 2022-10-23 MED ORDER — HYDROCORTISONE SOD SUC (PF) 100 MG IJ SOLR
100.0000 mg | Freq: Three times a day (TID) | INTRAMUSCULAR | Status: DC
  Administered 2022-10-23 (×2): 100 mg via INTRAVENOUS
  Filled 2022-10-23 (×2): qty 2

## 2022-10-23 MED ORDER — AMIODARONE LOAD VIA INFUSION
150.0000 mg | Freq: Once | INTRAVENOUS | Status: AC
  Administered 2022-10-23: 150 mg via INTRAVENOUS
  Filled 2022-10-23: qty 83.34

## 2022-10-23 MED ORDER — MIDAZOLAM-SODIUM CHLORIDE 100-0.9 MG/100ML-% IV SOLN
0.0000 mg/h | INTRAVENOUS | Status: DC
  Administered 2022-10-23: 2 mg/h via INTRAVENOUS
  Filled 2022-10-23: qty 100

## 2022-10-23 MED ORDER — AMIODARONE HCL IN DEXTROSE 360-4.14 MG/200ML-% IV SOLN
30.0000 mg/h | INTRAVENOUS | Status: DC
  Administered 2022-10-24 (×2): 30 mg/h via INTRAVENOUS
  Filled 2022-10-23 (×2): qty 200

## 2022-10-23 MED ORDER — DEXMEDETOMIDINE HCL IN NACL 400 MCG/100ML IV SOLN
0.0000 ug/kg/h | INTRAVENOUS | Status: DC
  Administered 2022-10-23: 0.5 ug/kg/h via INTRAVENOUS
  Administered 2022-10-23 – 2022-10-24 (×2): 0.7 ug/kg/h via INTRAVENOUS
  Filled 2022-10-23 (×3): qty 100

## 2022-10-23 MED ORDER — SODIUM CHLORIDE 0.9 % IV SOLN
250.0000 mL | INTRAVENOUS | Status: DC
  Administered 2022-10-23: 250 mL via INTRAVENOUS

## 2022-10-23 MED ORDER — INSULIN ASPART 100 UNIT/ML IJ SOLN
0.0000 [IU] | INTRAMUSCULAR | Status: DC
  Administered 2022-10-23: 2 [IU] via SUBCUTANEOUS
  Administered 2022-10-24: 3 [IU] via SUBCUTANEOUS
  Administered 2022-10-24 (×2): 2 [IU] via SUBCUTANEOUS

## 2022-10-23 MED ORDER — SODIUM CHLORIDE 0.9 % IV SOLN
1.0000 g | INTRAVENOUS | Status: DC
  Administered 2022-10-23: 1 g via INTRAVENOUS
  Filled 2022-10-23: qty 10

## 2022-10-23 MED ORDER — NOREPINEPHRINE 4 MG/250ML-% IV SOLN: 0.0000 ug/min | INTRAVENOUS | Status: DC

## 2022-10-23 MED ORDER — SODIUM CHLORIDE 0.9 % IV SOLN
500.0000 mg | INTRAVENOUS | Status: DC
  Administered 2022-10-23: 500 mg via INTRAVENOUS
  Filled 2022-10-23: qty 5

## 2022-10-23 MED ORDER — MIDAZOLAM HCL 2 MG/2ML IJ SOLN
INTRAMUSCULAR | Status: AC
  Administered 2022-10-23: 2 mg
  Filled 2022-10-23: qty 2

## 2022-10-23 MED ORDER — AMIODARONE HCL IN DEXTROSE 360-4.14 MG/200ML-% IV SOLN
60.0000 mg/h | INTRAVENOUS | Status: AC
  Administered 2022-10-23 (×2): 60 mg/h via INTRAVENOUS
  Filled 2022-10-23 (×2): qty 200

## 2022-10-23 MED ORDER — NOREPINEPHRINE 4 MG/250ML-% IV SOLN
0.0000 ug/min | INTRAVENOUS | Status: DC
  Administered 2022-10-23: 6 ug/min via INTRAVENOUS
  Administered 2022-10-24: 5 ug/min via INTRAVENOUS
  Filled 2022-10-23 (×2): qty 250

## 2022-10-23 MED ORDER — MIDAZOLAM HCL 2 MG/2ML IJ SOLN
2.0000 mg | INTRAMUSCULAR | Status: DC | PRN
  Administered 2022-10-23 (×2): 2 mg via INTRAVENOUS
  Filled 2022-10-23 (×2): qty 2

## 2022-10-23 NOTE — Progress Notes (Signed)
[facility] Progress Note  1 Day Post-Op  Subjective: Patient seen and examined.  He is intubated and sedated in the ICU.  He is currently on levophed at 10 and vaso.  He has had 850 cc of feculent output from his NG tube overnight.  He awakens on the ventilator and is able to shake his head yes and no to some questions.  He did confirm abdominal pain.  Objective: Vital signs in last 24 hours: Temp:  [96 F (35.6 C)-99 F (37.2 C)] 99 F (37.2 C) (02/10 0700) Pulse Rate:  [71-104] 78 (02/10 0900) Resp:  [15-28] 28 (02/10 0900) BP: (57-132)/(11-71) 106/22 (02/10 0900) SpO2:  [98 %-100 %] 100 % (02/10 0900) FiO2 (%):  [45 %-100 %] 45 % (02/10 0400) Weight:  [84.8 kg-88.8 kg] 88.8 kg (02/09 2229) Last BM Date : 10/20/22 (PTA)  Intake/Output from previous day: 02/09 0701 - 02/10 0700 In: 5801.1 [I.V.:2572.7; NG/GT:60; IV Piggyback:3168.4] Out: 2642 [Urine:542; Emesis/NG output:850; Blood:50] Intake/Output this shift: Total I/O In: -  Out: 100 [Urine:100]  General appearance: Intubated and sedated in ICU Nose: NG tube in place with feculent output in canister Resp: ET tube in place, on ventilator GI: Abdomen soft, mild distention, no percussion tenderness, incisional and lower abdominal tenderness to palpation; no rigidity, guarding, rebound tenderness; midline incision C/D/I with skin staples in place and honeycomb dressing  Lab Results:  Recent Labs    10/22/22 1617 10/23/22 0430  WBC 17.2* 4.7  HGB 12.8* 11.4*  HCT 39.7 35.2*  PLT 194 196   BMET Recent Labs    10/22/22 1617 10/23/22 0430  NA 139 139  K 4.4 3.8  CL 99 105  CO2 28 24  GLUCOSE 174* 124*  BUN 23 25*  CREATININE 1.08 1.08  CALCIUM 10.4* 8.9   PT/INR No results for input(s): "LABPROT", "INR" in the last 72 hours.  Studies/Results: DG CHEST PORT 1 VIEW  Result Date: 10/23/2022 CLINICAL DATA:  Intubated EXAM: PORTABLE CHEST 1 VIEW COMPARISON:  10/22/2022 FINDINGS: Endotracheal  tube with the tip 5.6 cm above the carina. Nasogastric tube with the tip projecting over the stomach. Bilateral diffuse interstitial thickening. Trace bilateral pleural effusions. No pneumothorax. No focal consolidation. Stable cardiomegaly. Prior CABG. No acute osseous abnormality. IMPRESSION: Mild CHF. Electronically Signed   By: Kathreen Devoid M.D.   On: 10/23/2022 09:50   [place] EKG SITE RITE  Result Date: 10/23/2022 If Site Rite image not attached, placement could not be confirmed due to current cardiac rhythm.  DG Chest Port 1 View  Result Date: 10/22/2022 CLINICAL DATA:  [id number].  Ventilator dependent respiratory failure. EXAM: PORTABLE CHEST 1 VIEW COMPARISON:  CT abdomen and pelvis earlier today, portable chest and chest CT both 11/25/2016. FINDINGS: 11:23 p.m. Interval intubation. Tip of the ETT is 4.3 cm from the carina. NGT courses to the left in the stomach with the tip in the proximal fundus. 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 There is tortuosity and calcification of the aorta with stable mediastinum. Small loculated left pleural effusion with pleural calcifications better demonstrated on CT. There is mild cardiomegaly with normal caliber central vessel markings. There is interval worsening of the lung fields with left-greater-than-right perihilar opacities developed and small layering pleural effusions. Findings could be due to pneumonia or edema. There is increasing left lower lobe consolidation as well. There is thoracic spondylosis. IMPRESSION: 1. Interval worsening of the lung fields with left-greater-than-right perihilar opacities and increasing left  lower lobe consolidation. Findings could be due to pneumonia or edema. Clinical correlation and continued radiographic follow-up recommended. 2. Small layering pleural effusions. 3. Mild cardiomegaly. 4. Aortic atherosclerosis. Electronically Signed   By: Telford Nab M.D.   On: 10/22/2022 23:50   CT  ABDOMEN PELVIS W CONTRAST  Result Date: 10/22/2022 CLINICAL DATA:  History of colon cancer. EXAM: CT ABDOMEN AND PELVIS WITH CONTRAST TECHNIQUE: Multidetector CT imaging of the abdomen and pelvis was performed using the standard protocol following bolus administration of intravenous contrast. RADIATION DOSE REDUCTION: This exam was performed according to the departmental dose-optimization program which includes automated exposure control, adjustment of the mA and/or kV according to patient size and/or use of iterative reconstruction technique. CONTRAST:  162m OMNIPAQUE IOHEXOL 300 MG/ML  SOLN COMPARISON:  05/11/2018 FINDINGS: Lower chest: Chronic left pleural effusion with pleural calcifications. Hepatobiliary: No focal liver abnormality is seen. No gallstones, gallbladder wall thickening, or biliary dilatation. Pancreas: Unremarkable. No pancreatic ductal dilatation or surrounding inflammatory changes. Spleen: Normal in size without focal abnormality. Adrenals/Urinary Tract: Adrenal glands are unremarkable. No urolithiasis or obstructive uropathy. 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 Stomach/Bowel: Numerous dilated loops of small bowel measuring up to 4.3 cm with multiple air-fluid levels. Ventral abdominal hernia containing numerous dilated loops of small bowel, surrounding soft tissue edema concerning for an incarcerated hernia resulting in small bowel obstruction. Distended stomach with an air-fluid level. Small hiatal hernia. Prior colectomy. Vascular/Lymphatic: Normal caliber abdominal aorta with mild atherosclerosis. No lymphadenopathy. Reproductive: Enlarged prostate gland. Other: Fluid containing right inguinal hernia. Fat containing left inguinal hernia. No abdominal ascites. Musculoskeletal: No acute osseous abnormality. No aggressive osseous lesion. IMPRESSION: 1. Ventral abdominal hernia containing numerous  dilated loops of small bowel, surrounding soft tissue edema concerning for an incarcerated hernia resulting in small bowel obstruction. 2. 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 3. Chronic left pleural effusion with pleural calcifications. 4.  Aortic Atherosclerosis (ICD10-I70.0). Electronically Signed   By: HKathreen DevoidM.D.   On: 10/22/2022 17:43    Anti-infectives: Anti-infectives (From admission, onward)    Start     Dose/Rate Route Frequency Ordered Stop   10/24/22 1000  vancomycin (VANCOREADY) IVPB 1500 mg/300 mL        1,500 mg 150 mL/hr over 120 Minutes Intravenous Every 24 hours 10/23/22 0853     10/23/22 0930  ceFEPIme (MAXIPIME) 2 g in sodium chloride 0.9 % 100 mL IVPB        2 g 200 mL/hr over 30 Minutes Intravenous Every 12 hours 10/23/22 0841     10/23/22 0930  vancomycin (VANCOREADY) IVPB 2000 mg/400 mL        2,000 mg 200 mL/hr over 120 Minutes Intravenous  Once 10/23/22 0843     10/23/22 0100  cefTRIAXone (ROCEPHIN) 1 g in sodium chloride 0.9 % 100 mL IVPB  Status:  Discontinued        1 g 200 mL/hr over 30 Minutes Intravenous Every 24 hours 10/23/22 0010 10/23/22 0830   10/23/22 0100  azithromycin (ZITHROMAX) 500 mg in sodium chloride 0.9 % 250 mL IVPB  Status:  Discontinued        500 mg 250 mL/hr over 60 Minutes Intravenous Every 24 hours 10/23/22 0010 10/23/22 0830   10/22/22 2013  sodium chloride 0.9 % with cefoTEtan (CEFOTAN) ADS Med       Note  to Pharmacy: Lear Ng S: cabinet override      10/22/22 2013 10/22/22 2017   10/22/22 1800  ampicillin-sulbactam (UNASYN) injection 3 g  Status:  Discontinued        3 g Intravenous  Once 10/22/22 1758 10/22/22 1800   10/22/22 1800  Ampicillin-Sulbactam (UNASYN) 3 g in sodium chloride 0.9 % 100 mL IVPB        3 g 200 mL/hr over 30 Minutes Intravenous  Once 10/22/22 1800 10/23/22 0844       Assessment/Plan:  Patient is an  82 year old male who was admitted with an incarcerated ventral hernia causing small bowel obstruction.  He underwent exploratory laparotomy with small bowel resection and ventral hernia repair on 2/9 for strangulated ventral hernia resulting in necrotic small bowel.  80 cm of small bowel resected.  -Overnight he had increasing vasopressor requirements, currently on Levophed 10 and vasopressin -I discussed need for central line placement with the family.  The risks and benefits of right IJ central venous catheter insertion with ultrasound guidance were discussed, including but not limited to bleeding, infection, injury to surrounding structures, pneumothorax, and need for additional procedures.  After careful consideration, Mortimer Fries Ardis has decided to proceed with central line placement -Please refer to separate dictation for details of procedure  -Patient's AM chest x-ray demonstrates findings concerning for mild CHF -Continue cefepime and vancomycin for possible aspiration pneumonia -Leukocytosis improved to 4.7 from 17.2 -Patient transitioned from propofol infusion to Versed infusion, as he was becoming more hypotensive while on propofol -PRN fentanyl pushes -NG to LIS -Monitor for return of bowel function -I suspect that the patient will have a significant postoperative ileus given the significant amount of bowel dilation noted intraoperatively -He may require TPN if he fails to have return of bowel function within the next few days -Dulcolax suppositories ordered -Maintain Foley while intubated -Continue to hold Plavix and aspirin; hemoglobin 11.4 this morning from 12.8 preoperatively -I discussed with the patient's family that he still has a guarded prognosis.  We will continue to monitor him closely, but he may require transfer to [facility] for management by the critical care team -Appreciate hospitalist recommendations   LOS: 1 day    Carita Sollars A [place] [employer] 10/23/2022

## 2022-10-23 NOTE — Progress Notes (Signed)
Provider notified of critical lactic acid of 2.9

## 2022-10-23 NOTE — Procedures (Signed)
Procedure Note  10/23/2022   Preoperative Diagnosis: Hemodynamic instability   Postoperative Diagnosis: Same   Procedure(s) Performed: Right internal jugular central venous catheter insertion via ultrasound guidance   Surgeon: Graciella Freer, DO   Assistants: None   Anesthesia: 1% lidocaine    Complications: None    Indications: Craig Fields is a 82 y.o. who was admitted status post exploratory laparotomy, partial small bowel resection, and partial omentectomy for strangulated ventral hernia.  Overnight, he has had increasing vasopressor requirements, and now requires central venous catheter insertion. I discussed the risk and benefits of placement of the right IJ central venous catheter insertion with ultrasound guidance, including but not limited to bleeding, infection, and risk of pneumothorax. [name] wife has given verbal and written consent for the procedure.    Procedure: The patient placed supine. The right chest and neck was prepped and draped in the usual sterile fashion.  Wearing full gown and gloves, I performed the procedure.  One percent lidocaine was used for local anesthesia. An ultrasound was utilized to assess the jugular vein and localize the overlying skin.  Using ultrasound guidance, the needle with syringe was advanced into the jugular vein with dark venous return, and a wire was placed using the Seldinger technique without difficulty.  Ectopia was not noted.  The wire was noted to be within the right IJ on ultrasound. The skin was knicked and a dilator was placed, and the three lumen catheter was placed over the wire with continued control of the wire.  There was good draw back of blood from all three lumens and each flushed easily with saline.  The catheter was secured in 3 points with 2-0 silk and a biopatch and dressing was placed.     The patient tolerated the procedure well, and the CXR was ordered to confirm position of the central line.   Graciella Freer, DO [facility] [address] [place], [place] [address] [phone] (office)

## 2022-10-23 NOTE — Progress Notes (Signed)
PT Cancellation Note  Patient Details Name: MUNEER GURGANIOUS MRN: [id number] DOB: 1940-11-10   Cancelled Treatment:    Reason Eval/Treat Not Completed: Medical issues which prohibited therapy;Patient not medically ready; PT order placed prior to surgery and following surgery patient now intubated and sedated. Please place new order when patient is medically ready for PT evaluation.  11:33 AM, 10/23/22 Mearl Latin PT, DPT Physical Therapist at [facility]

## 2022-10-23 NOTE — Progress Notes (Signed)
Pt arrived to unit from surgery at 2230,  pt sedated on vent, responds to pain only.  NGT connected to LIS with brown/green output.  Irrigated without difficulty.  Large volume output overnight.  Honeycomb dressing to ABD CDI.  Bowel sounds hypoactive, have not noted pt passing flatus at this time.  CXR done, showing verification of ET and NG tube but also showing possible PNA.  Provider notified, IV ABX ordered and initiated.  Pt woke up at [place].  During noc, pt has become increasingly agitated and restless, pulling at tubes and eventually requiring placement of safety soft mitt on right hand.  Efforts made to continue on IV sedation for comfort, however pt BP soft, MAP low, pt started and then maxed out on levophed.  1500 ml total fluid bolus given.  Pt responded well to versed but for less than 2 hours and then disconnected vent tubing.  Will attempt to try propofol again this am.  Pt denies ABD pain overnight, is responding to yes/no questions, able to follow commands.  Oral cares provided.  SR on monitor.  Abebrile.

## 2022-10-23 NOTE — Progress Notes (Signed)
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  Notified by lab that the patient had elevated troponin and mildly elevated lactic acid.  EKG had some tachycardia into the 140s, atrial fibrillation with some ST changes.  eICU Interventions  Repeat EKG Trending troponins Lactic acid check in the morning is okay     Intervention Category Major Interventions: Shock - evaluation and management  Craig Fields 10/23/2022, 8:33 PM

## 2022-10-23 NOTE — Progress Notes (Signed)
0845 Ventilator check performed and  parameters WNL. Patient sedated and per RN an additional pressor had to be administered. Awaiting order for ventilator weaning.

## 2022-10-23 NOTE — TOC Progression Note (Signed)
Transition of Care [facility]) - Progression Note    Patient Details  Name: Craig Fields MRN: [id number] Date of Birth: 1940/11/18  Transition of Care [facility]) CM/SW [place], [place] Phone Number: 10/23/2022, 11:31 AM  Clinical Narrative:    [facility] consulted for HH/DME needs. Pt intubated at this time. TOC to follow for needs once pt is more medically stable.       Expected Discharge Plan and Services                                               Social Determinants of Health (SDOH) Interventions SDOH Screenings   Food Insecurity: No Food Insecurity (10/23/2022)  Housing: Low Risk  (10/23/2022)  Transportation Needs: No Transportation Needs (10/23/2022)  Utilities: Not At Risk (10/23/2022)  Alcohol Screen: Low Risk  (06/28/2022)  Depression (PHQ2-9): Low Risk  (07/02/2022)  Financial Resource Strain: Low Risk  (06/28/2022)  Physical Activity: Inactive (06/28/2022)  Social Connections: Moderately Integrated (06/28/2022)  Stress: No Stress Concern Present (06/28/2022)  Tobacco Use: Medium Risk (10/22/2022)    Readmission Risk Interventions     No data to display

## 2022-10-23 NOTE — H&P (Signed)
NAME:  Craig Fields, MRN:  [id number], DOB:  [birth year], LOS: 1 ADMISSION DATE:  10/22/2022, CONSULTATION DATE:  2/10 REFERRING MD:  Manuella Ghazi, CHIEF COMPLAINT: Respiratory failure and septic shock  History of Present Illness:  82 year old male patient with history as mentioned below most significantly known Ventral hernia.  Admitted on 2/9 with abdominal pain, nausea and vomiting CT abdomen showed ventral hernia with small bowel loops and surrounding soft tissue edema concerning for incarcerated hernia and resultant small bowel obstruction, he is brought emergently to the operating room where he underwent exploratory laparotomy, ventral hernia repair, and bowel resection.  Postoperative course complicated by ongoing hypotension, requiring norepinephrine in spite of volume resuscitation, ongoing ventilator dependence.  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 Went for exploration laparotomy with small bowel resection and ventral hernia repair on 2/9 for strangulated ventral hernia resulting in necrotic small bowel. 80 cm of small bowel resected  2/10 transferred to [facility] for on-going NE needs and need for Mechanical ventilation   Interim History / Subjective:    Objective   Blood pressure (Abnormal) 150/38, pulse 65, temperature 99.5 F (37.5 C), temperature source Axillary, resp. rate (Abnormal) 24, height 5' 10"$  (1.778 m), weight 88.8 kg, SpO2 100 %.    Vent Mode: PRVC FiO2 (%):  [45 %-100 %] 45 % Set Rate:  [24 bmp] 24 bmp Vt Set:  [590 mL] 590 mL PEEP:  [5 cmH20] 5 cmH20 Plateau Pressure:  [18 cmH20-23 cmH20] 20 cmH20   Intake/Output Summary (Last 24 hours)  at 10/23/2022 1326 Last data filed at 10/23/2022 1215 Gross per 24 hour  Intake 8442.23 ml  Output 2892 ml  Net 5550.23 ml   Filed Weights   10/22/22 1137 10/22/22 2229  Weight: 84.8 kg 88.8 kg    Examination: General: Critically ill adult male, lying in bed, no distress  HENT: NG/ETT in place  Lungs: Coarse breath sounds, vent assisted breaths  Cardiovascular: Irregular, HR 131, no mRG Abdomen: distended, hypoactive bowel sounds  Extremities: -edema Neuro: sedated, opens eyes with physical stimulation  GU: foley in place   Resolved Hospital Problem list     Assessment & Plan:   Circulatory shock. D-dx sepsis (bacterial translocation from ischemic gut) vs sedation related.  Plan - Transduce CVP  - Ck scvo2 - Titrate NE and vasopressin for MAP > 65 - Abx day # 2 now on cefepime and vanc - Follow Culture Data. MRSA PCR pending   A.Fib RVR H/O CAD s/p CABG  - home ASA/Plavix  H/O HTN, HLD  Plan - Amiodarone bolus and gtt  - ECHO and Troponin pending - Hold home ASA/Plavix as NPO - Hold home HTN agents   Lactic acidosis 2/2 shock Plan - Repeat for clearance   S/p exploratory lap for strangulated ventral hernia resulting in necrotic small bowel. 80 cm of small bowel resected  -surgical services anticipate prolonged ileus Plan - NPO - Ask our surgical services to assist w/ post-op care - Start TPN at some point  Acute hypoxic respiratory failure w/ R>L Insterstitial airspace disease and LLL volume loss  Suspect mix of ALI and atelectasis but also consider element of edema  Plan - Full vent support -  PAD protocol  - RASS goal -2 - Transduce CVP - Daily assessment for SBT  Sedation Needs Plan - Add Precedex for RASS goal 0/-1 - Continue Fentanyl gtt   H/O CVA with residual left sided weakness    Best Practice (right click and "Reselect all SmartList Selections" daily)   Diet/type: NPO DVT prophylaxis: prophylactic heparin  GI prophylaxis: PPI Lines:  Central line and yes and it is still needed Foley:  Yes, and it is still needed Code Status:  full code Last date of multidisciplinary goals of care discussion [Family at bedside]  Labs   CBC: Recent Labs  Lab 10/22/22 1617 10/23/22 0430  WBC 17.2* 4.7  NEUTROABS 15.1*  --   HGB 12.8* 11.4*  HCT 39.7 35.2*  MCV 98.3 98.9  PLT 194 [id number]    Basic Metabolic Panel: Recent Labs  Lab 10/22/22 1617 10/23/22 0430  NA 139 139  K 4.4 3.8  CL 99 105  CO2 28 24  GLUCOSE 174* 124*  BUN 23 25*  CREATININE 1.08 1.08  CALCIUM 10.4* 8.9   GFR: Estimated Creatinine Clearance: 58.1 mL/min (by C-G formula based on SCr of 1.08 mg/dL). Recent Labs  Lab 10/22/22 1617 10/22/22 1749 10/23/22 0430 10/23/22 0909 10/23/22 1153  PROCALCITON  --   --   --   --  3.68  WBC 17.2*  --  4.7  --   --   LATICACIDVEN 2.6* 2.3* 2.9* 2.2*  --     Liver Function Tests: Recent Labs  Lab 10/22/22 1617 10/23/22 0430  AST 28 33  ALT 15 11  ALKPHOS 39 26*  BILITOT 1.2 1.0  PROT 7.7 5.6*  ALBUMIN 4.2 2.9*   No results for input(s): "LIPASE", "AMYLASE" in the last 168 hours. No results for input(s): "AMMONIA" in the last 168 hours.  ABG    Component Value Date/Time   PHART 7.43 10/22/2022 2256   PCO2ART 41 10/22/2022 2256   PO2ART 216 (H) 10/22/2022 2256   HCO3 27.2 10/22/2022 2256   O2SAT 100 10/22/2022 2256     Coagulation Profile: No results for input(s): "INR", "PROTIME" in the last 168 hours.  Cardiac Enzymes: No results for input(s): "CKTOTAL", "CKMB", "CKMBINDEX", "TROPONINI" in the last 168 hours.  HbA1C: Hgb A1c MFr Bld  Date/Time Value Ref Range Status  01/01/2020 04:20 PM 5.8 (H) <5.7 % of total Hgb Final    Comment:    For someone without known diabetes, a hemoglobin  A1c value between 5.7% and 6.4% is consistent with prediabetes and should be confirmed with a  follow-up test. . For someone with known diabetes, a value <7% indicates that their diabetes is well  controlled. A1c targets should be individualized based on duration of diabetes, age, comorbid conditions, and other considerations. . This assay result is consistent with an increased risk of diabetes. . Currently, no consensus exists regarding use of hemoglobin A1c for diagnosis of diabetes for children. Marland Kitchen   05/15/2019 10:46 AM 5.7 (H) <5.7 % of total Hgb Final    Comment:    For someone without known diabetes, a hemoglobin  A1c value between 5.7% and 6.4% is consistent with prediabetes and should be confirmed with a  follow-up test. . For someone with known diabetes, a value <7% indicates that their diabetes is well controlled. A1c targets should be individualized based on duration of diabetes, age, comorbid conditions, and other considerations. . This assay result is consistent with an increased risk of diabetes. . Currently, no consensus  exists regarding use of hemoglobin A1c for diagnosis of diabetes for children. .     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   Surgical History:   Past Surgical History:  Procedure Laterality Date   COLONOSCOPY N/A 07/15/2015   Procedure: COLONOSCOPY;  Surgeon: Aviva Signs, MD;  Location: [facility];  Service: Gastroenterology;  Laterality: N/A;   CORONARY ARTERY BYPASS GRAFT  09/13/2002   x4   HERNIA REPAIR     INCISIONAL HERNIA REPAIR N/A 01/01/2013   Procedure: HERNIA REPAIR INCISIONAL;   Surgeon: Jamesetta So, MD;  Location: [facility] ORS;  Service: General;  Laterality: N/A;  umbilical area   INSERTION OF MESH N/A 01/01/2013   Procedure: INSERTION OF MESH;  Surgeon: Jamesetta So, MD;  Location: [facility];  Service: General;  Laterality: N/A;   Left inguinal  hernia repair  09/14/2003   [name]  09/13/2005     Social History:   reports that he quit smoking about 41 years ago. His smoking use included cigarettes. He has a 2.50 pack-year smoking history. He quit smokeless tobacco use about 9 years ago.  His smokeless tobacco use included chew. He reports that he does not drink alcohol and does not use drugs.   Family History:  His family history includes Actinic keratosis in his sister; Anxiety disorder in his son; COPD in his sister; Heart disease in his brother, brother, and father; Obesity in his son; Pancreatic cancer in his mother; Prostate cancer in his father; Stroke in his brother.   Allergies Allergies  Allergen Reactions   Bee Venom Swelling   Niacin Other (See Comments)    Muscle aches     Home Medications  Prior to Admission medications   Medication Sig Start Date End Date Taking? Authorizing Provider  acetaminophen (TYLENOL) 650 MG CR tablet Take 1,900 mg by mouth in the morning and at bedtime.   Yes [provider]  aspirin 81 MG EC tablet Take 81 mg by mouth every evening.   Yes [provider]  Cholecalciferol (VITAMIN D) 2000 units CAPS Take 1 capsule by mouth 2 (two) times daily.   Yes [provider]  clopidogrel (PLAVIX) 75 MG tablet TAKE 1 TABLET BY MOUTH EVERY DAY Patient taking differently: Take 75 mg by mouth daily. 09/09/22  Yes Fayrene Helper, MD  Coenzyme Q10 (CO Q-10) 200 MG CAPS Take 1 tablet by mouth daily.    Yes [provider]  KLOR-CON M20 20 MEQ tablet TAKE 2 TABLETS BY MOUTH DAILY Patient taking differently: Take 40 mEq by mouth daily. 09/09/22  Yes Fayrene Helper, MD  lisinopril  (ZESTRIL) 10 MG tablet TAKE 1 TABLET BY MOUTH EVERY DAY Patient taking differently: Take 10 mg by mouth daily. 08/19/22  Yes Fayrene Helper, MD  metoprolol tartrate (LOPRESSOR) 50 MG tablet TAKE HALF A TABLET BY MOUTH 2 (TWO) TIMES DAILY. Patient taking differently: Take 25 mg by mouth 2 (two) times daily. TAKE HALF A TABLET BY MOUTH 2 (TWO) TIMES DAILY. 09/09/22  Yes  Fayrene Helper, MD  Multiple Vitamins-Minerals ([employer] SPECTRAVITE) TABS Take 1 tablet by mouth every evening.   Yes [provider]  polyethylene glycol powder (GLYCOLAX/MIRALAX) powder MIX TO TAKE 17 G DAILY AS DIRECTED Patient taking differently: Take 17 g by mouth daily as needed for mild constipation. 11/26/16  Yes Fayrene Helper, MD  simvastatin (ZOCOR) 20 MG tablet TAKE 1 TABLET BY MOUTH EVERYDAY AT BEDTIME Patient taking differently: Take 20 mg by mouth daily. 11/20/21  Yes Fayrene Helper, MD  triamterene-hydrochlorothiazide ([id number]) 37.5-25 MG tablet TAKE 1 TABLET BY MOUTH EVERY DAY IN THE MORNING Patient taking differently: Take 1 tablet by mouth daily. TAKE 1 TABLET BY MOUTH EVERY DAY IN THE MORNING 09/09/22  Yes Fayrene Helper, MD  oxyCODONE-acetaminophen (PERCOCET/ROXICET) 5-325 MG tablet Take one tablet by mouth two times daily, as needed, for severe pain 05/28/22   Fayrene Helper, MD  UNABLE TO FIND Commode assist chair 01/02/19   Fayrene Helper, MD  UNABLE TO FIND Bilateral new shoes  Left AFO with calliber plate  Dx [id number]   Perlie Mayo, NP  UNABLE TO FIND Right custom torch walker x 1  Dx [id number] 02/04/20   Perlie Mayo, NP     Critical care time: 52 minutes     CRITICAL CARE Performed by: Omar Person   Total critical care time: 52 minutes  Critical care time was exclusive of separately billable procedures and treating other patients.  Critical care was necessary to treat or prevent imminent or life-threatening deterioration.  Critical care  was time spent personally by me on the following activities: development of treatment plan with patient and/or surrogate as well as nursing, discussions with consultants, evaluation of patient's response to treatment, examination of patient, obtaining history from patient or surrogate, ordering and performing treatments and interventions, ordering and review of laboratory studies, ordering and review of radiographic studies, pulse oximetry and re-evaluation of patient's condition.  Hayden Pedro, AGACNP-BC [facility]  PCCM Pgr: [phone]

## 2022-10-23 NOTE — Procedures (Signed)
Arterial Catheter Insertion Procedure Note  Jamil Pigeon Macha  [id number]  09-Jul-1941  Date:10/23/22  Time:6:16 PM    Provider Performing: Omar Person    Procedure: Insertion of Arterial Line [id number]) with US guidance [id number]   Indication(s) Blood pressure monitoring and/or need for frequent ABGs  Consent Risks of the procedure as well as the alternatives and risks of each were explained to the patient and/or caregiver.  Consent for the procedure was obtained and is signed in the bedside chart  Anesthesia None   Time Out Verified patient identification, verified procedure, site/side was marked, verified correct patient position, special equipment/implants available, medications/allergies/relevant history reviewed, required imaging and test results available.   Sterile Technique Maximal sterile technique including full sterile barrier drape, hand hygiene, sterile gown, sterile gloves, mask, hair covering, sterile ultrasound probe cover (if used).   Procedure Description Area of catheter insertion was cleaned with chlorhexidine and draped in sterile fashion. With real-time ultrasound guidance an arterial catheter was placed into the left radial artery.  Appropriate arterial tracings confirmed on monitor.     Complications/Tolerance None; patient tolerated the procedure well.   EBL Minimal   Specimen(s) None

## 2022-10-23 NOTE — Progress Notes (Signed)
PROGRESS NOTE    Craig Fields  [id number] DOB: 13-Aug-1941 DOA: 10/22/2022 PCP: Fayrene Helper, MD   Brief Narrative:    Craig Fields is a 82 y.o. male with medical history significant of recurrent ventral hernia, CAD status post CABG, prior CVA, on chronic aspirin and Plavix, HTN, HLD, comes to the hospital with complaints of abdominal pain.  Patient was admitted with small bowel obstruction with incarcerated ventral hernia and had undergone urgent ventral hernia repair with bowel resection on 2/9.  He remains intubated postoperatively and is currently having some hypotension requiring the use of multiple pressors.  He underwent central venous line placement to right IJ on 2/10 per general surgery.  Assessment & Plan:   Principal Problem:   Incarcerated hernia Active Problems:   Strangulated ventral hernia  Assessment and Plan:  Circulatory shock in the setting of recent bowel resection Requiring norepinephrine and vasopressin at this time for blood pressure management Started on hydrocortisone this a.m. Continue aggressive IV fluid with multiple boluses given, but unable to get blood pressure above 90 systolic Repeat 2 L fluid bolus per PCCM Upgrade antibiotics from Rocephin and azithromycin to cefepime and vancomycin temporarily and monitor cultures Repeat chest x-ray appears improved Monitor procalcitonin Continue to monitor repeat labs and lactic acid levels Leukocytosis has down trended Propofol discontinued and started on Versed drip initially, changed to fentanyl drip per PCCM  Small bowel obstruction with incarcerated ventral hernia status post ventral hernia repair with bowel resection 2/9 Currently remains intubated postoperatively, wean ventilator as tolerated Further recommendations per general surgery appreciated Approximately 80 cm of necrotic bowel resected  Active problems CAD with history of CABG -long time ago, no recent ACS type symptoms, no chest  pain.  Currently holding aspirin and Plavix   Essential hypertension currently with shock physiology Holding antihypertensives   Hyperlipidemia -hold statin use strict n.p.o.   Prior CVA -at baseline per patient and wife    DVT prophylaxis: SCDs Code Status: Full Family Communication: Daughter and wife at bedside 2/10 Disposition Plan: Transfer to [facility] critical care team Status is: Inpatient Remains inpatient appropriate because: Need for IV medications  Consultants:  General surgery PCCM-discussed with Dr. Chase Caller  Procedures:  Urgent ventral hernia repair with bowel resection 2/9 Right IJ central venous line placement 2/10  Antimicrobials:  Anti-infectives (From admission, onward)    Start     Dose/Rate Route Frequency Ordered Stop   10/24/22 1000  vancomycin (VANCOREADY) IVPB 1500 mg/300 mL        1,500 mg 150 mL/hr over 120 Minutes Intravenous Every 24 hours 10/23/22 0853     10/23/22 0930  ceFEPIme (MAXIPIME) 2 g in sodium chloride 0.9 % 100 mL IVPB        2 g 200 mL/hr over 30 Minutes Intravenous Every 12 hours 10/23/22 0841     10/23/22 0930  vancomycin (VANCOREADY) IVPB 2000 mg/400 mL        2,000 mg 200 mL/hr over 120 Minutes Intravenous  Once 10/23/22 0843     10/23/22 0100  cefTRIAXone (ROCEPHIN) 1 g in sodium chloride 0.9 % 100 mL IVPB  Status:  Discontinued        1 g 200 mL/hr over 30 Minutes Intravenous Every 24 hours 10/23/22 0010 10/23/22 0830   10/23/22 0100  azithromycin (ZITHROMAX) 500 mg in sodium chloride 0.9 % 250 mL IVPB  Status:  Discontinued        500 mg 250 mL/hr over 60 Minutes Intravenous  Every 24 hours 10/23/22 0010 10/23/22 0830   10/22/22 2013  sodium chloride 0.9 % with cefoTEtan (CEFOTAN) ADS Med       Note to Pharmacy: Lear Ng S: cabinet override      10/22/22 2013 10/22/22 2017   10/22/22 1800  ampicillin-sulbactam (UNASYN) injection 3 g  Status:  Discontinued        3 g Intravenous  Once 10/22/22 1758 10/22/22 1800    10/22/22 1800  Ampicillin-Sulbactam (UNASYN) 3 g in sodium chloride 0.9 % 100 mL IVPB        3 g 200 mL/hr over 30 Minutes Intravenous  Once 10/22/22 1800 10/23/22 0844      Subjective: Patient seen and evaluated today and is currently sedated and on the ventilator.  Noted to have significant issues with hypotension overnight and received multiple fluid boluses and is currently on 2 pressors.  Central venous line placed by general surgery this morning to assist with management.  Family at bedside.  Objective: Vitals:   10/23/22 0839 10/23/22 0845 10/23/22 0900 10/23/22 1126  BP: (!) 104/24  (!) 106/22 (!) 150/38  Pulse: 71  78 65  Resp: (!) 24  (!) 28 (!) 24  Temp:    99.5 F (37.5 C)  TempSrc:    Axillary  SpO2: 100% 100% 100% 100%  Weight:      Height:        Intake/Output Summary (Last 24 hours) at 10/23/2022 1256 Last data filed at 10/23/2022 1215 Gross per 24 hour  Intake 8442.23 ml  Output 2892 ml  Net 5550.23 ml   Filed Weights   10/22/22 1137 10/22/22 2229  Weight: 84.8 kg 88.8 kg    Examination:  General exam: Appears sedated on the ventilator Respiratory system: Clear to auscultation. Respiratory effort normal.  Intubated on FiO2 45% Cardiovascular system: S1 & S2 heard, RRR.  Gastrointestinal system: Abdomen is soft Central nervous system: Sedated Extremities: No edema Skin: No significant lesions noted Psychiatry: Flat affect.    Data Reviewed: I have personally reviewed following labs and imaging studies  CBC: Recent Labs  Lab 10/22/22 1617 10/23/22 0430  WBC 17.2* 4.7  NEUTROABS 15.1*  --   HGB 12.8* 11.4*  HCT 39.7 35.2*  MCV 98.3 98.9  PLT 194 [id number]   Basic Metabolic Panel: Recent Labs  Lab 10/22/22 1617 10/23/22 0430  NA 139 139  K 4.4 3.8  CL 99 105  CO2 28 24  GLUCOSE 174* 124*  BUN 23 25*  CREATININE 1.08 1.08  CALCIUM 10.4* 8.9   GFR: Estimated Creatinine Clearance: 58.1 mL/min (by C-G formula based on SCr of 1.08  mg/dL). Liver Function Tests: Recent Labs  Lab 10/22/22 1617 10/23/22 0430  AST 28 33  ALT 15 11  ALKPHOS 39 26*  BILITOT 1.2 1.0  PROT 7.7 5.6*  ALBUMIN 4.2 2.9*   No results for input(s): "LIPASE", "AMYLASE" in the last 168 hours. No results for input(s): "AMMONIA" in the last 168 hours. Coagulation Profile: No results for input(s): "INR", "PROTIME" in the last 168 hours. Cardiac Enzymes: No results for input(s): "CKTOTAL", "CKMB", "CKMBINDEX", "TROPONINI" in the last 168 hours. BNP (last 3 results) No results for input(s): "PROBNP" in the last 8760 hours. HbA1C: No results for input(s): "HGBA1C" in the last 72 hours. CBG: Recent Labs  Lab 10/23/22 1223  GLUCAP 122*   Lipid Profile: Recent Labs    10/23/22 0430  TRIG 56   Thyroid Function Tests: No results for input(s): "TSH", "  T4TOTAL", "FREET4", "T3FREE", "THYROIDAB" in the last 72 hours. Anemia Panel: No results for input(s): "VITAMINB12", "FOLATE", "FERRITIN", "TIBC", "IRON", "RETICCTPCT" in the last 72 hours. Sepsis Labs: Recent Labs  Lab 10/22/22 1617 10/22/22 1749 10/23/22 0430 10/23/22 0909 10/23/22 1153  PROCALCITON  --   --   --   --  3.68  LATICACIDVEN 2.6* 2.3* 2.9* 2.2*  --     Recent Results (from the past 240 hour(s))  Culture, blood (Routine X 2) w Reflex to ID Panel     Status: None (Preliminary result)   Collection Time: 10/23/22  9:09 AM   Specimen: Right Antecubital; Blood  Result Value Ref Range Status   Specimen Description   Final    RIGHT ANTECUBITAL BOTTLES DRAWN AEROBIC AND ANAEROBIC   Special Requests   Final    Blood Culture adequate volume Performed at [facility], [address] [place], [place] [address]    Culture PENDING  Incomplete   Report Status PENDING  Incomplete  Culture, blood (Routine X 2) w Reflex to ID Panel     Status: None (Preliminary result)   Collection Time: 10/23/22  9:09 AM   Specimen: BLOOD RIGHT HAND  Result Value Ref Range Status   Specimen  Description   Final    BLOOD RIGHT HAND BOTTLES DRAWN AEROBIC AND ANAEROBIC   Special Requests   Final    Blood Culture adequate volume Performed at [facility], [address] [place], [place] [address]    Culture PENDING  Incomplete   Report Status PENDING  Incomplete         Radiology Studies: DG CHEST PORT 1 VIEW  Result Date: 10/23/2022 CLINICAL DATA:  Central line placement.  Endotracheal tube present. EXAM: PORTABLE CHEST 1 VIEW COMPARISON:  Prior today FINDINGS: New right jugular central venous catheter seen with tip overlying the superior cavoatrial junction. No evidence of pneumothorax. Endotracheal tube and gastric tube remain in place. Stable cardiomegaly. Stable interstitial infiltrates, consistent with interstitial edema. No evidence of pulmonary consolidation or pleural effusion. Prior CABG again noted. IMPRESSION: New right jugular central venous catheter in appropriate position. No evidence of pneumothorax. Stable cardiomegaly and mild interstitial edema. Electronically Signed   By: Marlaine Hind M.D.   On: 10/23/2022 11:15   DG CHEST PORT 1 VIEW  Result Date: 10/23/2022 CLINICAL DATA:  Intubated EXAM: PORTABLE CHEST 1 VIEW COMPARISON:  10/22/2022 FINDINGS: Endotracheal tube with the tip 5.6 cm above the carina. Nasogastric tube with the tip projecting over the stomach. Bilateral diffuse interstitial thickening. Trace bilateral pleural effusions. No pneumothorax. No focal consolidation. Stable cardiomegaly. Prior CABG. No acute osseous abnormality. IMPRESSION: Mild CHF. Electronically Signed   By: Kathreen Devoid M.D.   On: 10/23/2022 09:50   [place] EKG SITE RITE  Result Date: 10/23/2022 If Site Rite image not attached, placement could not be confirmed due to current cardiac rhythm.  DG Chest Port 1 View  Result Date: 10/22/2022 CLINICAL DATA:  [id number].  Ventilator dependent respiratory failure. EXAM: PORTABLE CHEST 1 VIEW COMPARISON:  CT abdomen and pelvis earlier today,  portable chest and chest CT both 11/25/2016. FINDINGS: 11:23 p.m. Interval intubation. Tip of the ETT is 4.3 cm from the carina. NGT courses to the left in the stomach with the tip in the proximal fundus. 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 There is tortuosity and calcification of the aorta with stable mediastinum. Small loculated left pleural effusion with pleural calcifications better demonstrated on CT.  There is mild cardiomegaly with normal caliber central vessel markings. There is interval worsening of the lung fields with left-greater-than-right perihilar opacities developed and small layering pleural effusions. Findings could be due to pneumonia or edema. There is increasing left lower lobe consolidation as well. There is thoracic spondylosis. IMPRESSION: 1. Interval worsening of the lung fields with left-greater-than-right perihilar opacities and increasing left lower lobe consolidation. Findings could be due to pneumonia or edema. Clinical correlation and continued radiographic follow-up recommended. 2. Small layering pleural effusions. 3. Mild cardiomegaly. 4. Aortic atherosclerosis. Electronically Signed   By: Telford Nab M.D.   On: 10/22/2022 23:50   CT ABDOMEN PELVIS W CONTRAST  Result Date: 10/22/2022 CLINICAL DATA:  History of colon cancer. EXAM: CT ABDOMEN AND PELVIS WITH CONTRAST TECHNIQUE: Multidetector CT imaging of the abdomen and pelvis was performed using the standard protocol following bolus administration of intravenous contrast. RADIATION DOSE REDUCTION: This exam was performed according to the departmental dose-optimization program which includes automated exposure control, adjustment of the mA and/or kV according to patient size and/or use of iterative reconstruction technique. CONTRAST:  161m OMNIPAQUE IOHEXOL 300 MG/ML  SOLN COMPARISON:  05/11/2018 FINDINGS: Lower chest: Chronic left pleural effusion with pleural calcifications.  Hepatobiliary: No focal liver abnormality is seen. No gallstones, gallbladder wall thickening, or biliary dilatation. Pancreas: Unremarkable. No pancreatic ductal dilatation or surrounding inflammatory changes. Spleen: Normal in size without focal abnormality. Adrenals/Urinary Tract: Adrenal glands are unremarkable. No urolithiasis or obstructive uropathy. 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 Stomach/Bowel: Numerous dilated loops of small bowel measuring up to 4.3 cm with multiple air-fluid levels. Ventral abdominal hernia containing numerous dilated loops of small bowel, surrounding soft tissue edema concerning for an incarcerated hernia resulting in small bowel obstruction. Distended stomach with an air-fluid level. Small hiatal hernia. Prior colectomy. Vascular/Lymphatic: Normal caliber abdominal aorta with mild atherosclerosis. No lymphadenopathy. Reproductive: Enlarged prostate gland. Other: Fluid containing right inguinal hernia. Fat containing left inguinal hernia. No abdominal ascites. Musculoskeletal: No acute osseous abnormality. No aggressive osseous lesion. IMPRESSION: 1. Ventral abdominal hernia containing numerous dilated loops of small bowel, surrounding soft tissue edema concerning for an incarcerated hernia resulting in small bowel obstruction. 2. 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 3. Chronic left pleural effusion with pleural calcifications. 4.  Aortic Atherosclerosis (ICD10-I70.0). Electronically Signed   By: HKathreen DevoidM.D.   On: 10/22/2022 17:43        Scheduled Meds:  bisacodyl  10 mg Rectal Daily   Chlorhexidine Gluconate Cloth  6 each Topical Daily   hydrocortisone sod succinate (SOLU-CORTEF) inj  100 mg Intravenous Q8H   Continuous Infusions:  sodium chloride 100 mL/hr at 10/23/22 1227   sodium  chloride 20 mL/hr at 10/23/22 1142   sodium chloride     acetaminophen 1,000 mg (10/23/22 1223)   ceFEPime (MAXIPIME) IV 2 g (10/23/22 1251)   fentaNYL infusion INTRAVENOUS     norepinephrine (LEVOPHED) Adult infusion 10 mcg/min (10/23/22 1215)   sodium chloride     [START ON 10/24/2022] vancomycin     vancomycin 2,000 mg (10/23/22 1253)   vasopressin Stopped (10/23/22 1143)     LOS: 1 day    Total critical care time: 60 minutes spent reviewing the chart, ordering labs and medications, writing the note, discussing with consultants and family members.    Gresham Wenzlick DDarleen Crocker DO [facility]  If 7PM-7AM, please contact night-coverage [email or web address] 10/23/2022, 12:56 PM

## 2022-10-23 NOTE — Progress Notes (Signed)
Report give to [facility] and they endorse being 25 minutes out from [facility]. Family at the bedside and made aware.

## 2022-10-23 NOTE — Progress Notes (Signed)
Call from DR Hassell Halim MD at [facility] am rounding weekend ccm and was aked by hot pager to take this call  P lap with SBR yesterday and now VDRF 45% fio2 and septic shock. Diprivan stopped and pressor needs improved. CVL placed . On 33mg levophed and low dose vaso. Creat fine. No CCM at [facility] over weekend  Plan - 1-2 more Liter Iv fluids  - sedate with fent gtt and versed prn and fent prn - rass goal o to -2 - continue vent - [facility] - continue abx      SIGNATURE    Dr. MBrand Males M.D., F.C.C.P,  Pulmonary and Critical Care Medicine Staff Physician, CHills and DalesDirector - Interstitial Lung Disease  Program  Medical Director - [facility] [place] [address]  Pager: [phone] If no answer  ->[place] [phone] Telephone (clinical office): [phone] Telephone (research): [phone]  12:36 PM 10/23/2022

## 2022-10-23 NOTE — Progress Notes (Signed)
There is vasopressor ordered, and patient has CVC. No USGPIV needed at this time. HS [employer]

## 2022-10-23 NOTE — Progress Notes (Signed)
Pharmacy Antibiotic Note  Craig Fields is a 82 y.o. male admitted on 10/22/2022 with sepsis.  Pharmacy has been consulted for vancomycin and cefepime dosing.  Plan: Vancomycin 2000 mg IV x 1 dose. Vancomycin 1500 mg IV every 24 hours. Cefepime 2000 mg IV every 12 hours. Monitor labs, c/s, and vanco levels as indicated.  Height: 5' 10"$  (177.8 cm) Weight: 88.8 kg (195 lb 12.3 oz) IBW/kg (Calculated) : 73  Temp (24hrs), Avg:97.6 F (36.4 C), Min:96 F (35.6 C), Max:99 F (37.2 C)  Recent Labs  Lab 10/22/22 1617 10/22/22 1749 10/23/22 0430  WBC 17.2*  --  4.7  CREATININE 1.08  --  1.08  LATICACIDVEN 2.6* 2.3* 2.9*    Estimated Creatinine Clearance: 58.1 mL/min (by C-G formula based on SCr of 1.08 mg/dL).    Allergies  Allergen Reactions   Bee Venom Swelling   Niacin Other (See Comments)    Muscle aches    Antimicrobials this admission: Vanco 2/10 >> Cefepime 2/10 >> Azith 2/10  CTX 2/10    Microbiology results: 2/10 BCx: pending 2/9 MRSA PCR: pending  Thank you for allowing pharmacy to be a part of this patient's care.  Ramond Craver 10/23/2022 8:54 AM

## 2022-10-24 ENCOUNTER — Inpatient Hospital Stay (HOSPITAL_COMMUNITY): Payer: Medicare Other

## 2022-10-24 DIAGNOSIS — K460 Unspecified abdominal hernia with obstruction, without gangrene: Secondary | ICD-10-CM | POA: Diagnosis not present

## 2022-10-24 DIAGNOSIS — J9601 Acute respiratory failure with hypoxia: Secondary | ICD-10-CM | POA: Diagnosis not present

## 2022-10-24 DIAGNOSIS — A419 Sepsis, unspecified organism: Secondary | ICD-10-CM | POA: Diagnosis not present

## 2022-10-24 DIAGNOSIS — R6521 Severe sepsis with septic shock: Secondary | ICD-10-CM

## 2022-10-24 DIAGNOSIS — R578 Other shock: Secondary | ICD-10-CM | POA: Diagnosis not present

## 2022-10-24 LAB — COMPREHENSIVE METABOLIC PANEL
ALT: 11 U/L (ref 0–44)
AST: 22 U/L (ref 15–41)
Albumin: 2.3 g/dL — ABNORMAL LOW (ref 3.5–5.0)
Alkaline Phosphatase: 32 U/L — ABNORMAL LOW (ref 38–126)
Anion gap: 10 (ref 5–15)
BUN: 24 mg/dL — ABNORMAL HIGH (ref 8–23)
CO2: 19 mmol/L — ABNORMAL LOW (ref 22–32)
Calcium: 8.4 mg/dL — ABNORMAL LOW (ref 8.9–10.3)
Chloride: 109 mmol/L (ref 98–111)
Creatinine, Ser: 0.9 mg/dL (ref 0.61–1.24)
GFR, Estimated: >60 mL/min (ref >60)
Glucose, Bld: 156 mg/dL — ABNORMAL HIGH (ref 70–99)
Potassium: 3 mmol/L — ABNORMAL LOW (ref 3.5–5.1)
Sodium: 138 mmol/L (ref 135–145)
Total Bilirubin: 0.7 mg/dL (ref 0.3–1.2)
Total Protein: 5.1 g/dL — ABNORMAL LOW (ref 6.5–8.1)

## 2022-10-24 LAB — BASIC METABOLIC PANEL
Anion gap: 8 (ref 5–15)
BUN: 25 mg/dL — ABNORMAL HIGH (ref 8–23)
CO2: 20 mmol/L — ABNORMAL LOW (ref 22–32)
Calcium: 8.5 mg/dL — ABNORMAL LOW (ref 8.9–10.3)
Chloride: 109 mmol/L (ref 98–111)
Creatinine, Ser: 0.79 mg/dL (ref 0.61–1.24)
GFR, Estimated: >60 mL/min (ref >60)
Glucose, Bld: 110 mg/dL — ABNORMAL HIGH (ref 70–99)
Potassium: 3.5 mmol/L (ref 3.5–5.1)
Sodium: 137 mmol/L (ref 135–145)

## 2022-10-24 LAB — GLUCOSE, CAPILLARY
Glucose-Capillary: 101 mg/dL — ABNORMAL HIGH (ref 70–99)
Glucose-Capillary: 113 mg/dL — ABNORMAL HIGH (ref 70–99)
Glucose-Capillary: 117 mg/dL — ABNORMAL HIGH (ref 70–99)
Glucose-Capillary: 137 mg/dL — ABNORMAL HIGH (ref 70–99)
Glucose-Capillary: 138 mg/dL — ABNORMAL HIGH (ref 70–99)
Glucose-Capillary: 98 mg/dL (ref 70–99)

## 2022-10-24 LAB — LACTIC ACID, PLASMA: Lactic Acid, Venous: 1.4 mmol/L (ref 0.5–1.9)

## 2022-10-24 LAB — PHOSPHORUS: Phosphorus: 2.1 mg/dL — ABNORMAL LOW (ref 2.5–4.6)

## 2022-10-24 LAB — TROPONIN I (HIGH SENSITIVITY)
Troponin I (High Sensitivity): 1466 ng/L (ref <18)
Troponin I (High Sensitivity): 1659 ng/L (ref <18)
Troponin I (High Sensitivity): 1597 ng/L (ref <18)

## 2022-10-24 LAB — CBC
HCT: 30 % — ABNORMAL LOW (ref 39.0–52.0)
Hemoglobin: 9.9 g/dL — ABNORMAL LOW (ref 13.0–17.0)
MCH: 31.7 pg (ref 26.0–34.0)
MCHC: 33 g/dL (ref 30.0–36.0)
MCV: 96.2 fL (ref 80.0–100.0)
Platelets: 166 10*3/uL (ref 150–400)
RBC: 3.12 MIL/uL — ABNORMAL LOW (ref 4.22–5.81)
RDW: 13.9 % (ref 11.5–15.5)
WBC: 10.9 10*3/uL — ABNORMAL HIGH (ref 4.0–10.5)
nRBC: 0 % (ref 0.0–0.2)

## 2022-10-24 LAB — PROTIME-INR
INR: 1.4 — ABNORMAL HIGH (ref 0.8–1.2)
Prothrombin Time: 16.9 seconds — ABNORMAL HIGH (ref 11.4–15.2)

## 2022-10-24 LAB — MAGNESIUM: Magnesium: 1.6 mg/dL — ABNORMAL LOW (ref 1.7–2.4)

## 2022-10-24 LAB — PROCALCITONIN: Procalcitonin: 5.09 ng/mL

## 2022-10-24 MED ORDER — LACTATED RINGERS IV SOLN: INTRAVENOUS | Status: DC

## 2022-10-24 MED ORDER — FENTANYL CITRATE PF 50 MCG/ML IJ SOSY: 25.0000 ug | PREFILLED_SYRINGE | INTRAMUSCULAR | Status: DC | PRN

## 2022-10-24 MED ORDER — MAGNESIUM SULFATE 4 GM/100ML IV SOLN
4.0000 g | Freq: Once | INTRAVENOUS | Status: AC
  Administered 2022-10-24: 4 g via INTRAVENOUS
  Filled 2022-10-24: qty 100

## 2022-10-24 MED ORDER — POTASSIUM CHLORIDE 10 MEQ/50ML IV SOLN
10.0000 meq | INTRAVENOUS | Status: AC
  Administered 2022-10-24 (×4): 10 meq via INTRAVENOUS
  Filled 2022-10-24: qty 50

## 2022-10-24 MED ORDER — POTASSIUM PHOSPHATES 15 MMOLE/5ML IV SOLN
15.0000 mmol | Freq: Once | INTRAVENOUS | Status: AC
  Administered 2022-10-24: 15 mmol via INTRAVENOUS
  Filled 2022-10-24: qty 5

## 2022-10-24 MED ORDER — PIPERACILLIN-TAZOBACTAM 3.375 G IVPB
3.3750 g | Freq: Four times a day (QID) | INTRAVENOUS | Status: DC
  Administered 2022-10-24 – 2022-10-25 (×4): 3.375 g via INTRAVENOUS
  Filled 2022-10-24 (×4): qty 50

## 2022-10-24 MED ORDER — PERFLUTREN LIPID MICROSPHERE
1.0000 mL | INTRAVENOUS | Status: AC | PRN
  Administered 2022-10-24: 8 mL via INTRAVENOUS

## 2022-10-24 MED ORDER — LIP MEDEX EX OINT
TOPICAL_OINTMENT | CUTANEOUS | Status: DC | PRN
  Filled 2022-10-24 (×2): qty 7

## 2022-10-24 MED ORDER — POLYETHYLENE GLYCOL 3350 17 G PO PACK: 17.0000 g | PACK | Freq: Every day | ORAL | Status: DC

## 2022-10-24 MED ORDER — DOCUSATE SODIUM 50 MG/5ML PO LIQD: 100.0000 mg | Freq: Two times a day (BID) | ORAL | Status: DC

## 2022-10-24 NOTE — Progress Notes (Signed)
RT NOTE:  Unable to collect ordered sputum due to lack of secretions. Will report to next shift.

## 2022-10-24 NOTE — Progress Notes (Signed)
[facility] ADULT ICU REPLACEMENT PROTOCOL   The patient does apply for the [facility] Adult ICU Electrolyte Replacment Protocol based on the criteria listed below:   1.Exclusion criteria: TCTS, ECMO, Dialysis, and Myasthenia Gravis patients 2. Is GFR >/= 30 ml/min? Yes.    Patient's GFR today is > 60 3. Is SCr </= 2? Yes.   Patient's SCr is 0.90 mg/dL 4. Did SCr increase >/= 0.5 in 24 hours? No. 5.Pt's weight >40kg  Yes.   6. Abnormal electrolyte(s): K+ 3.0, Mg 1.6, Phos 2.1  7. Electrolytes replaced per protocol 8.  Call MD STAT for K+ </= 2.5, Phos </= 1, or Mag </= 1 Physician:  Dr Salome Holmes, Elfredia Nevins 10/24/2022 4:25 AM

## 2022-10-24 NOTE — Progress Notes (Addendum)
NAME:  Craig Fields, MRN:  [id number], DOB:  02-06-1941, LOS: 1 ADMISSION DATE:  10/22/2022, CONSULTATION DATE:  2/10 REFERRING MD:  Manuella Ghazi, CHIEF COMPLAINT: Respiratory failure and septic shock  History of Present Illness:  82 year old male patient with history as mentioned below most significantly known Ventral hernia.  Admitted on 2/9 with abdominal pain, nausea and vomiting CT abdomen showed ventral hernia with small bowel loops and surrounding soft tissue edema concerning for incarcerated hernia and resultant small bowel obstruction, he is brought emergently to the operating room where he underwent exploratory laparotomy, ventral hernia repair, and bowel resection.  Postoperative course complicated by ongoing hypotension, requiring norepinephrine in spite of volume resuscitation, ongoing ventilator dependence.  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 Went for exploration laparotomy with small bowel resection and ventral hernia repair on 2/9 for strangulated ventral hernia resulting in necrotic small bowel. 80 cm of small bowel resected  2/10 transferred to [facility] for on-going NE needs and need for Mechanical ventilation  2/11 trops mildly elevated, pressor stable   Interim History / Subjective:  Awake, communicating   Objective   Blood pressure (Abnormal) 150/38, pulse 65, temperature 99.5 F (37.5 C), temperature source Axillary, resp. rate (Abnormal) 24, height 5' 10"$  (1.778 m), weight 88.8 kg, SpO2 100 %.    Vent Mode: PRVC FiO2 (%):  [45 %-100 %] 45 % Set Rate:  [24 bmp] 24 bmp Vt Set:  [590 mL] 590 mL PEEP:  [5 cmH20] 5 cmH20 Plateau Pressure:  [18  cmH20-23 cmH20] 20 cmH20   Intake/Output Summary (Last 24 hours) at 10/23/2022 1326 Last data filed at 10/23/2022 1215 Gross per 24 hour  Intake 8442.23 ml  Output 2892 ml  Net 5550.23 ml   Filed Weights   10/22/22 1137 10/22/22 2229  Weight: 84.8 kg 88.8 kg    Examination: General: This is an 82 year old male patient is currently resting and appears in no acute distress HEENT normocephalic atraumatic orally intubated no JVD right IJ triple-lumen catheter is unremarkable Pulmonary: Coarse, no accessory use.  Attempted SBT, but triggered apnea alarm sedation now completely off Portable chest x-ray with bilateral interstitial airspace disease looks worse today when comparing previous film Regular rate and rhythm without murmur rub or gallop Abdomen: Soft, hypoactive bowel sounds.  Dressing clean dry and intact Extremities: Warm, dry, dependent edema Neuro: Awake, with voice.  Nods appropriately, follows commands.  Has left-sided weakness following prior stroke, left hand is contracted.  Uses a brace with left lower extremity. GU: Clear yellow urine.  Resolved Hospital Problem list   Lactic acidosis resolved  Assessment & Plan:   Circulatory shock. Septic shock  (bacterial translocation from ischemic gut & aspiration) vs sedation related.  Plan Cont to wean NE for MAP > 65 Cont MIFVs Abx day # 3. (Vanc and cefepime); f/u cultures changing to mono coverage with Zosyn after discussion with surgery this will cover for abdomen as well as aspiration  A.Fib RVR->converted at 5am to NSR on 2/11 H/O CAD s/p CABG - home ASA/Plavix  H/O HTN, HLD  Troponin elevation  Troponins trending down Plan Cont amiodarone gtt Holding home asa and plavix Need to decide on systemic AC.  Repeat  12 lead today  F/u echo. Cards eval if WMA   S/p exploratory lap for strangulated ventral hernia resulting in necrotic small bowel. 80 cm of small bowel resected  -surgical services anticipate prolonged  ileus Plan NPO Keep nasogastric tube in low intermittent wall suction Will ask surgical service to a/w care Likely will need TPN  Acute hypoxic respiratory failure w/ bilateral interstitial airspace disease suspect mix of ALI, aspiration and atelectasis but also consider element of edema  Plan Continuing full ventilator support, with attempt at SBT later today once sedation has worn off SBT daily VAP bundle PAD protocol RASS goal 0 to -1 Day #3 antibiotics, switching to Zosyn.  Anticipate 5 to 7 days therapy  Hypokalemia, hypomagnesemia hypophosphatemia Plan Replace and recheck  Mild nonanion gap metabolic acidosis Plan Continue to monitor  Postoperative anemia Plan Monitor Regan heparin and SCDs for now, given atrial fibs need to decide on anticoagulation  Hyperglycemia Plan Monitor Scale insulin goal 140-180  Sedation Needs Plan Discontinue Precedex and fentanyl infusions As needed fentanyl for pain  H/O CVA with residual left sided weakness  Plan Supportive care Will need his lower extremity brace for immobilization after extubated  Best Practice (right click and "Reselect all SmartList Selections" daily)   Diet/type: NPO DVT prophylaxis: prophylactic heparin  GI prophylaxis: PPI Lines: Central line and yes and it is still needed Foley:  Yes, and it is still needed Code Status:  full code Last date of multidisciplinary goals of care discussion [Family at bedside]  Critical care time:  40 minutes    Erick Colace ACNP-BC Central Pager # [phone] OR # [phone] if no answer

## 2022-10-24 NOTE — Progress Notes (Signed)
Fentanyl 150 cc wasted with Arlyss Repress.

## 2022-10-24 NOTE — Progress Notes (Signed)
OT Cancellation Note  Patient Details Name: Craig Fields Banner MRN: [id number] DOB: [birth year]   Cancelled Treatment:    Reason Eval/Treat Not Completed: Patient not medically ready- per RN, possible extubation this am.  Will check be and see as able.   Jolaine Artist, OT Acute Rehabilitation Services Office [phone]   Delight Stare 10/24/2022, 7:52 AM

## 2022-10-24 NOTE — Procedures (Signed)
Extubation Procedure Note  Patient Details:   Name: Craig Fields DOB: [birth year] MRN: [id number]   Airway Documentation:    Vent end date: 10/24/22 Vent end time: 0948   Evaluation  O2 sats: stable throughout Complications: No apparent complications Patient did tolerate procedure well. Bilateral Breath Sounds: Clear, Diminished   Yes, pt could speak post extubation.  Pt extubated to 4 l/m  without difficulty.  [name] 10/24/2022, 9:48 AM

## 2022-10-24 NOTE — Consult Note (Signed)
[facility] Surgery Consult Note  Craig Fields November 05, 1940  [id number].    Requesting MD: Brand Males Chief Complaint/Reason for Consult: postop transfer  HPI:  Craig Fields is a 82 y.o. male PMH CAD with prior CABG, hx CVA on chronic aspirin and Plavix, HTN, and HLD who was admitted to [facility] 10/22/22 with abdominal pain and found to have Strangulated ventral hernia causing small bowel obstruction with necrotic small bowel requiring exploratory laparotomy, primary ventral hernia repair, small bowel resection, partial omentectomy  10/22/22 by Dr. Okey Dupre. There was concern for possible aspiration upon induction. Postoperative course complicated by ongoing hypotension, requiring norepinephrine in spite of volume resuscitation, ongoing ventilator dependence.  Transferred to [facility] for critical care services. General surgery asked to consult for assistance with postoperative management.   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    Essential hypertension    Hematuria 04/30/2019   New complaint, does nott have pain of kidney stones, needs to submit UA for testing asap and needs urology eval   Hyperlipidemia    IGT (impaired glucose tolerance)    Obesity    OSA (obstructive sleep apnea)    Seizures ([place])    Age 1 or 3, no meds and no reoccurances   Skin lesion 12/07/2012   Skin tag 11/16/2011   Stroke ([place])    Umbilical hernia     Past Surgical History:  Procedure Laterality Date    COLONOSCOPY N/A 07/15/2015   Procedure: COLONOSCOPY;  Surgeon: Aviva Signs, MD;  Location: [facility] ENDO SUITE;  Service: Gastroenterology;  Laterality: N/A;   CORONARY ARTERY BYPASS GRAFT  09/13/2002   x4   HERNIA REPAIR     INCISIONAL HERNIA REPAIR N/A 01/01/2013   Procedure: HERNIA REPAIR INCISIONAL;  Surgeon: Jamesetta So, MD;  Location: [facility] ORS;  Service: General;  Laterality: N/A;  umbilical area   INSERTION OF MESH N/A 01/01/2013   Procedure: INSERTION OF MESH;  Surgeon: Jamesetta So, MD;  Location: [facility] ORS;  Service: General;  Laterality: N/A;   Left inguinal  hernia repair  09/14/2003   [name]  09/13/2005    Social History:  reports that he quit smoking about 41 years ago. His smoking use included cigarettes. He has a 2.50 pack-year smoking history. He quit smokeless tobacco use about 9 years ago.  His smokeless tobacco use included chew. He reports that he does not drink alcohol and does not use drugs.  Allergies:  Allergies  Allergen Reactions   Bee Venom Swelling   Niacin Other (See Comments)    Muscle aches    Medications Prior to Admission  Medication Sig Dispense Refill   acetaminophen (TYLENOL) 650 MG CR tablet Take 1,900 mg by mouth in the morning and at bedtime.     aspirin 81 MG EC tablet Take 81 mg by mouth every evening.  Cholecalciferol (VITAMIN D) 2000 units CAPS Take 1 capsule by mouth 2 (two) times daily.     clopidogrel (PLAVIX) 75 MG tablet TAKE 1 TABLET BY MOUTH EVERY DAY (Patient taking differently: Take 75 mg by mouth daily.) 90 tablet 1   Coenzyme Q10 (CO Q-10) 200 MG CAPS Take 1 tablet by mouth daily.      KLOR-CON M20 20 MEQ tablet TAKE 2 TABLETS BY MOUTH DAILY (Patient taking differently: Take 40 mEq by mouth daily.) 180 tablet 1   lisinopril (ZESTRIL) 10 MG tablet TAKE 1 TABLET BY MOUTH EVERY DAY (Patient taking differently: Take 10 mg by mouth daily.) 90 tablet 1   metoprolol tartrate (LOPRESSOR) 50 MG tablet TAKE HALF A TABLET BY MOUTH 2  (TWO) TIMES DAILY. (Patient taking differently: Take 25 mg by mouth 2 (two) times daily. TAKE HALF A TABLET BY MOUTH 2 (TWO) TIMES DAILY.) 90 tablet 1   Multiple Vitamins-Minerals ([employer] SPECTRAVITE) TABS Take 1 tablet by mouth every evening.     polyethylene glycol powder (GLYCOLAX/MIRALAX) powder MIX TO TAKE 17 G DAILY AS DIRECTED (Patient taking differently: Take 17 g by mouth daily as needed for mild constipation.) 527 g 1   simvastatin (ZOCOR) 20 MG tablet TAKE 1 TABLET BY MOUTH EVERYDAY AT BEDTIME (Patient taking differently: Take 20 mg by mouth daily.) 90 tablet 3   triamterene-hydrochlorothiazide (MAXZIDE-25) 37.5-25 MG tablet TAKE 1 TABLET BY MOUTH EVERY DAY IN THE MORNING (Patient taking differently: Take 1 tablet by mouth daily. TAKE 1 TABLET BY MOUTH EVERY DAY IN THE MORNING) 90 tablet 1   oxyCODONE-acetaminophen (PERCOCET/ROXICET) 5-325 MG tablet Take one tablet by mouth two times daily, as needed, for severe pain 12 tablet 0   UNABLE TO FIND Commode assist chair 1 each 0   UNABLE TO FIND Bilateral new shoes  Left AFO with calliber plate  Dx [id number] 1 each PRN   UNABLE TO FIND Right custom torch walker x 1  Dx [id number] 1 each 0    Prior to Admission medications   Medication Sig Start Date End Date Taking? Authorizing Provider  acetaminophen (TYLENOL) 650 MG CR tablet Take 1,900 mg by mouth in the morning and at bedtime.   Yes [provider]  aspirin 81 MG EC tablet Take 81 mg by mouth every evening.   Yes [provider]  Cholecalciferol (VITAMIN D) 2000 units CAPS Take 1 capsule by mouth 2 (two) times daily.   Yes [provider]  clopidogrel (PLAVIX) 75 MG tablet TAKE 1 TABLET BY MOUTH EVERY DAY Patient taking differently: Take 75 mg by mouth daily. 09/09/22  Yes Fayrene Helper, MD  Coenzyme Q10 (CO Q-10) 200 MG CAPS Take 1 tablet by mouth daily.    Yes [provider]  KLOR-CON M20 20 MEQ tablet TAKE 2 TABLETS BY MOUTH DAILY Patient taking  differently: Take 40 mEq by mouth daily. 09/09/22  Yes Fayrene Helper, MD  lisinopril (ZESTRIL) 10 MG tablet TAKE 1 TABLET BY MOUTH EVERY DAY Patient taking differently: Take 10 mg by mouth daily. 08/19/22  Yes Fayrene Helper, MD  metoprolol tartrate (LOPRESSOR) 50 MG tablet TAKE HALF A TABLET BY MOUTH 2 (TWO) TIMES DAILY. Patient taking differently: Take 25 mg by mouth 2 (two) times daily. TAKE HALF A TABLET BY MOUTH 2 (TWO) TIMES DAILY. 09/09/22  Yes Fayrene Helper, MD  Multiple Vitamins-Minerals ([employer]S SPECTRAVITE) TABS Take 1 tablet by mouth every evening.   Yes [provider]  polyethylene glycol  powder (GLYCOLAX/MIRALAX) powder MIX TO TAKE 17 G DAILY AS DIRECTED Patient taking differently: Take 17 g by mouth daily as needed for mild constipation. 11/26/16  Yes Fayrene Helper, MD  simvastatin (ZOCOR) 20 MG tablet TAKE 1 TABLET BY MOUTH EVERYDAY AT BEDTIME Patient taking differently: Take 20 mg by mouth daily. 11/20/21  Yes Fayrene Helper, MD  triamterene-hydrochlorothiazide ([id number]) 37.5-25 MG tablet TAKE 1 TABLET BY MOUTH EVERY DAY IN THE MORNING Patient taking differently: Take 1 tablet by mouth daily. TAKE 1 TABLET BY MOUTH EVERY DAY IN THE MORNING 09/09/22  Yes Fayrene Helper, MD  oxyCODONE-acetaminophen (PERCOCET/ROXICET) 5-325 MG tablet Take one tablet by mouth two times daily, as needed, for severe pain 05/28/22   Fayrene Helper, MD  UNABLE TO FIND Commode assist chair 01/02/19   Fayrene Helper, MD  UNABLE TO FIND Bilateral new shoes  Left AFO with calliber plate  Dx [id number]   Perlie Mayo, NP  UNABLE TO FIND Right custom torch walker x 1  Dx [id number] 02/04/20   Perlie Mayo, NP    Blood pressure (!) 120/49, pulse (!) 58, temperature 98.6 F (37 C), temperature source Axillary, resp. rate (!) 24, height 5' 10"$  (1.778 m), weight 88.8 kg, SpO2 100 %. Physical Exam: General: elderly male in NAD, sedated on the vent HEENT: NG  and ETT in place Heart: regular, rate, and rhythm Lungs: mechanically ventilated Abd: soft, ND, NT, midline incision with staples present and honeycomb dressing in place, no erythema or drainage, hypoactive bowel sounds MS: mild BLE edema Skin: warm and dry  Psych/neuro: sedated, unable to assess  Results for orders placed or performed during the hospital encounter of 10/22/22 (from the past 48 hour(s))  Lactic acid, plasma     Status: Abnormal   Collection Time: 10/22/22  4:17 PM  Result Value Ref Range   Lactic Acid, Venous 2.6 (HH) 0.5 - 1.9 mmol/L    Comment: CRITICAL RESULT CALLED TO, READ BACK BY AND VERIFIED WITH: DANNER,A ON 10/22/22 AT 1720 BY LOY,C Performed at [facility], [address], [place], [place] [address]   CBC with Differential     Status: Abnormal   Collection Time: 10/22/22  4:17 PM  Result Value Ref Range   WBC 17.2 (H) 4.0 - 10.5 K/uL   RBC 4.04 (L) 4.22 - 5.81 MIL/uL   Hemoglobin 12.8 (L) 13.0 - 17.0 g/dL   HCT 39.7 39.0 - 52.0 %   MCV 98.3 80.0 - 100.0 fL   MCH 31.7 26.0 - 34.0 pg   MCHC 32.2 30.0 - 36.0 g/dL   RDW 13.5 11.5 - 15.5 %   Platelets 194 150 - 400 K/uL   nRBC 0.0 0.0 - 0.2 %   Neutrophils Relative % 88 %   Neutro Abs 15.1 (H) 1.7 - 7.7 K/uL   Lymphocytes Relative 4 %   Lymphs Abs 0.8 0.7 - 4.0 K/uL   Monocytes Relative 7 %   Monocytes Absolute 1.2 (H) 0.1 - 1.0 K/uL   Eosinophils Relative 0 %   Eosinophils Absolute 0.0 0.0 - 0.5 K/uL   Basophils Relative 0 %   Basophils Absolute 0.0 0.0 - 0.1 K/uL   Immature Granulocytes 1 %   Abs Immature Granulocytes 0.10 (H) 0.00 - 0.07 K/uL    Comment: Performed at [facility], [address], [place], [place] [address]  Comprehensive metabolic panel     Status: Abnormal   Collection Time: 10/22/22  4:17 PM  Result Value Ref Range   Sodium 139 135 - 145 mmol/L   Potassium 4.4 3.5 - 5.1 mmol/L   Chloride 99 98 - 111 mmol/L   CO2 28 22 - 32 mmol/L   Glucose, Bld 174 (H) 70 - 99 mg/dL     Comment: Glucose reference range applies only to samples taken after fasting for at least 8 hours.   BUN 23 8 - 23 mg/dL   Creatinine, Ser 1.08 0.61 - 1.24 mg/dL   Calcium 10.4 (H) 8.9 - 10.3 mg/dL   Total Protein 7.7 6.5 - 8.1 g/dL   Albumin 4.2 3.5 - 5.0 g/dL   AST 28 15 - 41 U/L   ALT 15 0 - 44 U/L   Alkaline Phosphatase 39 38 - 126 U/L   Total Bilirubin 1.2 0.3 - 1.2 mg/dL   GFR, Estimated >60 >60 mL/min    Comment: (NOTE) Calculated using the CKD-EPI Creatinine Equation (2021)    Anion gap 12 5 - 15    Comment: Performed at [facility], [address] [place], [place] [address]  Lactic acid, plasma     Status: Abnormal   Collection Time: 10/22/22  5:49 PM  Result Value Ref Range   Lactic Acid, Venous 2.3 (HH) 0.5 - 1.9 mmol/L    Comment: CRITICAL VALUE NOTED.  VALUE IS CONSISTENT WITH PREVIOUSLY REPORTED AND CALLED VALUE. Performed at [facility], [address] [place], [place] [address]   Blood gas, arterial     Status: Abnormal   Collection Time: 10/22/22 10:56 PM  Result Value Ref Range   FIO2 100.0 %   pH, Arterial 7.43 7.35 - 7.45   pCO2 arterial 41 32 - 48 mmHg   pO2, Arterial 216 (H) 83 - 108 mmHg   Bicarbonate 27.2 20.0 - 28.0 mmol/L   Acid-Base Excess 2.6 (H) 0.0 - 2.0 mmol/L   O2 Saturation 100 %   Patient temperature 37.0    Collection site RIGHT RADIAL    Drawn by [id number]    Allens test (pass/fail) PASS PASS    Comment: Performed at [facility] [address] [place], [place] [address]  MRSA Next Gen by PCR, Nasal     Status: None   Collection Time: 10/22/22 11:32 PM   Specimen: Nasal Mucosa; Nasal Swab  Result Value Ref Range   MRSA by PCR Next Gen NOT DETECTED NOT DETECTED    Comment: (NOTE) The GeneXpert MRSA Assay (FDA approved for NASAL specimens only), is one component of a comprehensive MRSA colonization surveillance program. It is not intended to diagnose MRSA infection nor to guide or monitor treatment for MRSA infections. Test performance  is not FDA approved in patients less than 53 years old. Performed at [facility], [address] [place],  [address]   Comprehensive metabolic panel     Status: Abnormal   Collection Time: 10/23/22  4:30 AM  Result Value Ref Range   Sodium 139 135 - 145 mmol/L   Potassium 3.8 3.5 - 5.1 mmol/L   Chloride 105 98 - 111 mmol/L   CO2 24 22 - 32 mmol/L   Glucose, Bld 124 (H) 70 - 99 mg/dL    Comment: Glucose reference range applies only to samples taken after fasting for at least 8 hours.   BUN 25 (H) 8 - 23 mg/dL   Creatinine, Ser 1.08 0.61 - 1.24 mg/dL   Calcium 8.9 8.9 - 10.3 mg/dL   Total Protein 5.6 (L) 6.5 - 8.1 g/dL  Albumin 2.9 (L) 3.5 - 5.0 g/dL   AST 33 15 - 41 U/L   ALT 11 0 - 44 U/L   Alkaline Phosphatase 26 (L) 38 - 126 U/L   Total Bilirubin 1.0 0.3 - 1.2 mg/dL   GFR, Estimated >60 >60 mL/min    Comment: (NOTE) Calculated using the CKD-EPI Creatinine Equation (2021)    Anion gap 10 5 - 15    Comment: Performed at [facility], [address] [place], [place] [address]  CBC     Status: Abnormal   Collection Time: 10/23/22  4:30 AM  Result Value Ref Range   WBC 4.7 4.0 - 10.5 K/uL   RBC 3.56 (L) 4.22 - 5.81 MIL/uL   Hemoglobin 11.4 (L) 13.0 - 17.0 g/dL   HCT 35.2 (L) 39.0 - 52.0 %   MCV 98.9 80.0 - 100.0 fL   MCH 32.0 26.0 - 34.0 pg   MCHC 32.4 30.0 - 36.0 g/dL   RDW 13.7 11.5 - 15.5 %   Platelets 196 150 - 400 K/uL   nRBC 0.0 0.0 - 0.2 %    Comment: Performed at [facility], [address], [place], [place] [address]  Triglycerides     Status: None   Collection Time: 10/23/22  4:30 AM  Result Value Ref Range   Triglycerides 56 <150 mg/dL    Comment: Performed at [facility], [address] [place], [place] [address]  Lactic acid, plasma     Status: Abnormal   Collection Time: 10/23/22  4:30 AM  Result Value Ref Range   Lactic Acid, Venous 2.9 (HH) 0.5 - 1.9 mmol/L    Comment: CRITICAL RESULT CALLED TO, READ BACK BY AND VERIFIED WITH: BHEPTERAI @ 0631  ON [id number] BY HENDERSON L Performed at [facility], [address] [place], [place] [address]   Lactic acid, plasma     Status: Abnormal   Collection Time: 10/23/22  9:09 AM  Result Value Ref Range   Lactic Acid, Venous 2.2 (HH) 0.5 - 1.9 mmol/L    Comment: CRITICAL RESULT CALLED TO, READ BACK BY AND VERIFIED WITH: HARRIS @ 0942 ON [id number] BY HENDERSON L Performed at [facility], [address], [place], [place] [address]   Culture, blood (Routine X 2) w Reflex to ID Panel     Status: None (Preliminary result)   Collection Time: 10/23/22  9:09 AM   Specimen: Right Antecubital; Blood  Result Value Ref Range   Specimen Description      RIGHT ANTECUBITAL BOTTLES DRAWN AEROBIC AND ANAEROBIC   Special Requests Blood Culture adequate volume    Culture      NO GROWTH < 24 HOURS Performed at [facility], [address] [place], [place] [address]    Report Status PENDING   Culture, blood (Routine X 2) w Reflex to ID Panel     Status: None (Preliminary result)   Collection Time: 10/23/22  9:09 AM   Specimen: BLOOD RIGHT HAND  Result Value Ref Range   Specimen Description      BLOOD RIGHT HAND BOTTLES DRAWN AEROBIC AND ANAEROBIC   Special Requests Blood Culture adequate volume    Culture      NO GROWTH < 24 HOURS Performed at [facility], [address] [place], [place] [address]    Report Status PENDING   Procalcitonin - Baseline     Status: None   Collection Time: 10/23/22 11:53 AM  Result Value Ref Range   Procalcitonin 3.68 ng/mL  Comment:        Interpretation: PCT > 2 ng/mL: Systemic infection (sepsis) is likely, unless other causes are known. (NOTE)       Sepsis PCT Algorithm           Lower Respiratory Tract                                      Infection PCT Algorithm    ----------------------------     ----------------------------         PCT < 0.25 ng/mL                PCT < 0.10 ng/mL          Strongly encourage             Strongly discourage   discontinuation  of antibiotics    initiation of antibiotics    ----------------------------     -----------------------------       PCT 0.25 - 0.50 ng/mL            PCT 0.10 - 0.25 ng/mL               OR       >80% decrease in PCT            Discourage initiation of                                            antibiotics      Encourage discontinuation           of antibiotics    ----------------------------     -----------------------------         PCT >= 0.50 ng/mL              PCT 0.26 - 0.50 ng/mL               AND       <80% decrease in PCT              Encourage initiation of                                             antibiotics       Encourage continuation           of antibiotics    ----------------------------     -----------------------------        PCT >= 0.50 ng/mL                  PCT > 0.50 ng/mL               AND         increase in PCT                  Strongly encourage                                      initiation of antibiotics    Strongly encourage escalation           of antibiotics                                     -----------------------------  PCT <= 0.25 ng/mL                                                 OR                                        > 80% decrease in PCT                                      Discontinue / Do not initiate                                             antibiotics  Performed at [facility], [address] [place], [place] [address]   Glucose, capillary     Status: Abnormal   Collection Time: 10/23/22 12:23 PM  Result Value Ref Range   Glucose-Capillary 122 (H) 70 - 99 mg/dL    Comment: Glucose reference range applies only to samples taken after fasting for at least 8 hours.  Glucose, capillary     Status: Abnormal   Collection Time: 10/23/22  4:43 PM  Result Value Ref Range   Glucose-Capillary 132 (H) 70 - 99 mg/dL    Comment: Glucose reference range applies only to samples taken after fasting  for at least 8 hours.  Lactic acid, plasma     Status: None   Collection Time: 10/23/22  6:28 PM  Result Value Ref Range   Lactic Acid, Venous 1.8 0.5 - 1.9 mmol/L    Comment: Performed at [facility] [address], [place], [place] [address]  Magnesium     Status: Abnormal   Collection Time: 10/23/22  6:28 PM  Result Value Ref Range   Magnesium 1.5 (L) 1.7 - 2.4 mg/dL    Comment: Performed at [facility] [address], [place], [place] [address]  Phosphorus     Status: None   Collection Time: 10/23/22  6:28 PM  Result Value Ref Range   Phosphorus 2.7 2.5 - 4.6 mg/dL    Comment: Performed at [facility], [place] [address], [place], [place] [address]  Strep pneumoniae urinary antigen     Status: None   Collection Time: 10/23/22  6:28 PM  Result Value Ref Range   Strep Pneumo Urinary Antigen NEGATIVE NEGATIVE    Comment:        Infection due to S. pneumoniae cannot be absolutely ruled out since the antigen present may be below the detection limit of the test. Performed at [facility], [place] [address] [place], [place] [address]   Troponin I (High Sensitivity)     Status: Abnormal   Collection Time: 10/23/22  6:28 PM  Result Value Ref Range   Troponin I (High Sensitivity) 1,169 (HH) <18 ng/L    Comment: CRITICAL RESULT CALLED TO, READ BACK BY AND VERIFIED WITH B,[place] RN @2004$  10/23/22 E,[place]ENTON (NOTE) Elevated high sensitivity troponin I (hsTnI) values and significant  changes across serial measurements may suggest ACS but many other  chronic and acute conditions are  known to elevate hsTnI results.  Refer to the "Links" section for chest pain algorithms and additional  guidance. Performed at [facility], [place] [address], [place], [place] [address]   Glucose, capillary     Status: Abnormal   Collection Time: 10/23/22  7:28 PM  Result Value Ref Range   Glucose-Capillary 149 (H) 70 - 99 mg/dL    Comment: Glucose reference range applies  only to samples taken after fasting for at least 8 hours.  Troponin I (High Sensitivity)     Status: Abnormal   Collection Time: 10/23/22  9:26 PM  Result Value Ref Range   Troponin I (High Sensitivity) 1,314 (HH) <18 ng/L    Comment: CRITICAL VALUE NOTED. VALUE IS CONSISTENT WITH PREVIOUSLY REPORTED/CALLED VALUE (NOTE) Elevated high sensitivity troponin I (hsTnI) values and significant  changes across serial measurements may suggest ACS but many other  chronic and acute conditions are known to elevate hsTnI results.  Refer to the "Links" section for chest pain algorithms and additional  guidance. Performed at [facility], [place] [address], [place], [place] [address]   Troponin I (High Sensitivity)     Status: Abnormal   Collection Time: 10/23/22 10:32 PM  Result Value Ref Range   Troponin I (High Sensitivity) 1,466 (HH) <18 ng/L    Comment: CRITICAL VALUE NOTED. VALUE IS CONSISTENT WITH PREVIOUSLY REPORTED/CALLED VALUE (NOTE) Elevated high sensitivity troponin I (hsTnI) values and significant  changes across serial measurements may suggest ACS but many other  chronic and acute conditions are known to elevate hsTnI results.  Refer to the "Links" section for chest pain algorithms and additional  guidance. Performed at [facility], [place] [address], [place], [place] [address]   Glucose, capillary     Status: Abnormal   Collection Time: 10/23/22 11:36 PM  Result Value Ref Range   Glucose-Capillary 173 (H) 70 - 99 mg/dL    Comment: Glucose reference range applies only to samples taken after fasting for at least 8 hours.  CBC     Status: Abnormal   Collection Time: 10/24/22  2:06 AM  Result Value Ref Range   WBC 10.9 (H) 4.0 - 10.5 K/uL   RBC 3.12 (L) 4.22 - 5.81 MIL/uL   Hemoglobin 9.9 (L) 13.0 - 17.0 g/dL   HCT 30.0 (L) 39.0 - 52.0 %   MCV 96.2 80.0 - 100.0 fL   MCH 31.7 26.0 - 34.0 pg   MCHC 33.0 30.0 - 36.0 g/dL   RDW 13.9 11.5 - 15.5 %   Platelets 166 150 - 400  K/uL   nRBC 0.0 0.0 - 0.2 %    Comment: Performed at [facility], [place] [address], [place], [place] [address]  Magnesium     Status: Abnormal   Collection Time: 10/24/22  2:06 AM  Result Value Ref Range   Magnesium 1.6 (L) 1.7 - 2.4 mg/dL    Comment: Performed at [facility] [address], [place], [place] [address]  Comprehensive metabolic panel     Status: Abnormal   Collection Time: 10/24/22  2:06 AM  Result Value Ref Range   Sodium 138 135 - 145 mmol/L   Potassium 3.0 (L) 3.5 - 5.1 mmol/L   Chloride 109 98 - 111 mmol/L   CO2 19 (L) 22 - 32 mmol/L   Glucose, Bld 156 (H) 70 - 99 mg/dL    Comment: Glucose reference range applies only to samples taken after fasting for  at least 8 hours.   BUN 24 (H) 8 - 23 mg/dL   Creatinine, Ser 0.90 0.61 - 1.24 mg/dL   Calcium 8.4 (L) 8.9 - 10.3 mg/dL   Total Protein 5.1 (L) 6.5 - 8.1 g/dL   Albumin 2.3 (L) 3.5 - 5.0 g/dL   AST 22 15 - 41 U/L   ALT 11 0 - 44 U/L   Alkaline Phosphatase 32 (L) 38 - 126 U/L   Total Bilirubin 0.7 0.3 - 1.2 mg/dL   GFR, Estimated >60 >60 mL/min    Comment: (NOTE) Calculated using the CKD-EPI Creatinine Equation (2021)    Anion gap 10 5 - 15    Comment: Performed at [facility], [place] [address], [place], [place] [address]  Procalcitonin     Status: None   Collection Time: 10/24/22  2:06 AM  Result Value Ref Range   Procalcitonin 5.09 ng/mL    Comment:        Interpretation: PCT > 2 ng/mL: Systemic infection (sepsis) is likely, unless other causes are known. (NOTE)       Sepsis PCT Algorithm           Lower Respiratory Tract                                      Infection PCT Algorithm    ----------------------------     ----------------------------         PCT < 0.25 ng/mL                PCT < 0.10 ng/mL          Strongly encourage             Strongly discourage   discontinuation of antibiotics    initiation of antibiotics    ----------------------------      -----------------------------       PCT 0.25 - 0.50 ng/mL            PCT 0.10 - 0.25 ng/mL               OR       >80% decrease in PCT            Discourage initiation of                                            antibiotics      Encourage discontinuation           of antibiotics    ----------------------------     -----------------------------         PCT >= 0.50 ng/mL              PCT 0.26 - 0.50 ng/mL               AND       <80% decrease in PCT              Encourage initiation of                                             antibiotics       Encourage continuation  of antibiotics    ----------------------------     -----------------------------        PCT >= 0.50 ng/mL                  PCT > 0.50 ng/mL               AND         increase in PCT                  Strongly encourage                                      initiation of antibiotics    Strongly encourage escalation           of antibiotics                                     -----------------------------                                           PCT <= 0.25 ng/mL                                                 OR                                        > 80% decrease in PCT                                      Discontinue / Do not initiate                                             antibiotics  Performed at [facility], [address], [place], [place] [address]   Phosphorus     Status: Abnormal   Collection Time: 10/24/22  2:06 AM  Result Value Ref Range   Phosphorus 2.1 (L) 2.5 - 4.6 mg/dL    Comment: Performed at [facility] [address], [place], [place] [address]  Protime-INR     Status: Abnormal   Collection Time: 10/24/22  2:06 AM  Result Value Ref Range   Prothrombin Time 16.9 (H) 11.4 - 15.2 seconds   INR 1.4 (H) 0.8 - 1.2    Comment: (NOTE) INR goal varies based on device and disease states. Performed at [facility], [place] [address], [place], [place] [address]    Lactic acid, plasma     Status: None   Collection Time: 10/24/22  2:06 AM  Result Value Ref Range   Lactic Acid, Venous 1.4 0.5 - 1.9 mmol/L    Comment: Performed at [facility] [address], [place], [place] [address]  Troponin I (High Sensitivity)     Status: Abnormal  Collection Time: 10/24/22  2:06 AM  Result Value Ref Range   Troponin I (High Sensitivity) 1,659 (HH) <18 ng/L    Comment: CRITICAL VALUE NOTED. VALUE IS CONSISTENT WITH PREVIOUSLY REPORTED/CALLED VALUE (NOTE) Elevated high sensitivity troponin I (hsTnI) values and significant  changes across serial measurements may suggest ACS but many other  chronic and acute conditions are known to elevate hsTnI results.  Refer to the "Links" section for chest pain algorithms and additional  guidance. Performed at [facility], [place] [address] [place], [place] [address]   Glucose, capillary     Status: Abnormal   Collection Time: 10/24/22  3:18 AM  Result Value Ref Range   Glucose-Capillary 138 (H) 70 - 99 mg/dL    Comment: Glucose reference range applies only to samples taken after fasting for at least 8 hours.  Troponin I (High Sensitivity)     Status: Abnormal   Collection Time: 10/24/22  5:36 AM  Result Value Ref Range   Troponin I (High Sensitivity) 1,597 (HH) <18 ng/L    Comment: CRITICAL VALUE NOTED. VALUE IS CONSISTENT WITH PREVIOUSLY REPORTED/CALLED VALUE (NOTE) Elevated high sensitivity troponin I (hsTnI) values and significant  changes across serial measurements may suggest ACS but many other  chronic and acute conditions are known to elevate hsTnI results.  Refer to the "Links" section for chest pain algorithms and additional  guidance. Performed at [facility], [place] [address] [place], [place] [address]   Glucose, capillary     Status: Abnormal   Collection Time: 10/24/22  7:26 AM  Result Value Ref Range   Glucose-Capillary 137 (H) 70 - 99 mg/dL    Comment: Glucose reference range  applies only to samples taken after fasting for at least 8 hours.   DG CHEST PORT 1 VIEW  Result Date: 10/24/2022 CLINICAL DATA:  Ventilator dependence. EXAM: PORTABLE CHEST 1 VIEW COMPARISON:  10/23/2022 FINDINGS: Endotracheal tube tip is 5.3 cm above the base of the carina. NG tube visualized although course through the lower chest is not CRNA April Manson due to technical factors. Right IJ central line tip overlies the mid SVC level. The cardio pericardial silhouette is enlarged. Bibasilar airspace disease evident with some airspace opacity noted left mid lung. Telemetry leads overlie the chest. IMPRESSION: 1. Endotracheal tube tip is 5.3 cm above the base of the carina. 2. Bibasilar and left mid lung airspace disease. Airspace opacity in the left parahilar mid lung appears progressive in the interval. Electronically Signed   By: Misty Stanley M.D.   On: 10/24/2022 07:53   DG CHEST PORT 1 VIEW  Result Date: 10/23/2022 CLINICAL DATA:  Central line placement.  Endotracheal tube present. EXAM: PORTABLE CHEST 1 VIEW COMPARISON:  Prior today FINDINGS: New right jugular central venous catheter seen with tip overlying the superior cavoatrial junction. No evidence of pneumothorax. Endotracheal tube and gastric tube remain in place. Stable cardiomegaly. Stable interstitial infiltrates, consistent with interstitial edema. No evidence of pulmonary consolidation or pleural effusion. Prior CABG again noted. IMPRESSION: New right jugular central venous catheter in appropriate position. No evidence of pneumothorax. Stable cardiomegaly and mild interstitial edema. Electronically Signed   By: Marlaine Hind M.D.   On: 10/23/2022 11:15   DG CHEST PORT 1 VIEW  Result Date: 10/23/2022 CLINICAL DATA:  Intubated EXAM: PORTABLE CHEST 1 VIEW COMPARISON:  10/22/2022 FINDINGS: Endotracheal tube with the tip 5.6 cm above the carina. Nasogastric tube with the tip projecting over the stomach. Bilateral diffuse interstitial thickening.  Trace bilateral pleural effusions. No pneumothorax. No focal consolidation. Stable cardiomegaly. Prior CABG. No acute osseous abnormality. IMPRESSION: Mild CHF. Electronically Signed   By: Kathreen Devoid M.D.   On: 10/23/2022 09:50   [place] EKG SITE RITE  Result Date: 10/23/2022 If Site Rite image not attached, placement could not be confirmed due to current cardiac rhythm.  DG Chest Port 1 View  Result Date: 10/22/2022 CLINICAL DATA:  [id number].  Ventilator dependent respiratory failure. EXAM: PORTABLE CHEST 1 VIEW COMPARISON:  CT abdomen and pelvis earlier today, portable chest and chest CT both 11/25/2016. FINDINGS: 11:23 p.m. Interval intubation. Tip of the ETT is 4.3 cm from the carina. NGT courses to the left in the stomach with the tip in the proximal fundus. 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 There is tortuosity and calcification of the aorta with stable mediastinum. Small loculated left pleural effusion with pleural calcifications better demonstrated on CT. There is mild cardiomegaly with normal caliber central vessel markings. There is interval worsening of the lung fields with left-greater-than-right perihilar opacities developed and small layering pleural effusions. Findings could be due to pneumonia or edema. There is increasing left lower lobe consolidation as well. There is thoracic spondylosis. IMPRESSION: 1. Interval worsening of the lung fields with left-greater-than-right perihilar opacities and increasing left lower lobe consolidation. Findings could be due to pneumonia or edema. Clinical correlation and continued radiographic follow-up recommended. 2. Small layering pleural effusions. 3. Mild cardiomegaly. 4. Aortic atherosclerosis. Electronically Signed   By: Telford Nab M.D.   On: 10/22/2022 23:50   CT ABDOMEN PELVIS W CONTRAST  Result Date: 10/22/2022 CLINICAL DATA:  History of colon cancer. EXAM: CT ABDOMEN AND PELVIS WITH CONTRAST TECHNIQUE:  Multidetector CT imaging of the abdomen and pelvis was performed using the standard protocol following bolus administration of intravenous contrast. RADIATION DOSE REDUCTION: This exam was performed according to the departmental dose-optimization program which includes automated exposure control, adjustment of the mA and/or kV according to patient size and/or use of iterative reconstruction technique. CONTRAST:  163m OMNIPAQUE IOHEXOL 300 MG/ML  SOLN COMPARISON:  05/11/2018 FINDINGS: Lower chest: Chronic left pleural effusion with pleural calcifications. Hepatobiliary: No focal liver abnormality is seen. No gallstones, gallbladder wall thickening, or biliary dilatation. Pancreas: Unremarkable. No pancreatic ductal dilatation or surrounding inflammatory changes. Spleen: Normal in size without focal abnormality. Adrenals/Urinary Tract: Adrenal glands are unremarkable. No urolithiasis or obstructive uropathy. 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 Stomach/Bowel: Numerous dilated loops of small bowel measuring up to 4.3 cm with multiple air-fluid levels. Ventral abdominal hernia containing numerous dilated loops of small bowel, surrounding soft tissue edema concerning for an incarcerated hernia resulting in small bowel obstruction. Distended stomach with an air-fluid level. Small hiatal hernia. Prior colectomy. Vascular/Lymphatic: Normal caliber abdominal aorta with mild atherosclerosis. No lymphadenopathy. Reproductive: Enlarged prostate gland. Other: Fluid containing right inguinal hernia. Fat containing left inguinal hernia. No abdominal ascites. Musculoskeletal: No acute osseous abnormality. No aggressive osseous lesion. IMPRESSION: 1. Ventral abdominal hernia containing numerous dilated loops of small bowel, surrounding soft tissue edema concerning for an incarcerated hernia resulting in small bowel obstruction. 2. Subcapsular  fluid collection involving the left kidney likely reflecting a chronic subcapsular hematoma measuring 4.6 x 2.3 cm. 3. Chronic left pleural effusion with pleural calcifications. 4.  Aortic Atherosclerosis (ICD10-I70.0). Electronically Signed   By: HKathreen DevoidM.D.   On: 10/22/2022 17:43    Anti-infectives (  From admission, onward)    Start     Dose/Rate Route Frequency Ordered Stop   10/24/22 1000  vancomycin (VANCOREADY) IVPB 1500 mg/300 mL  Status:  Discontinued        1,500 mg 150 mL/hr over 120 Minutes Intravenous Every 24 hours 10/23/22 0853 10/24/22 0900   10/24/22 1000  piperacillin-tazobactam (ZOSYN) IVPB 3.375 g        3.375 g 12.5 mL/hr over 240 Minutes Intravenous Every 6 hours 10/24/22 0905 10/29/22 2359   10/24/22 0600  cefoTEtan (CEFOTAN) 2 g in sodium chloride 0.9 % 100 mL IVPB  Status:  Discontinued        2 g 200 mL/hr over 30 Minutes Intravenous On call to O.R. 10/23/22 1705 10/24/22 0900   10/23/22 0930  ceFEPIme (MAXIPIME) 2 g in sodium chloride 0.9 % 100 mL IVPB  Status:  Discontinued        2 g 200 mL/hr over 30 Minutes Intravenous Every 12 hours 10/23/22 0841 10/24/22 0900   10/23/22 0930  vancomycin (VANCOREADY) IVPB 2000 mg/400 mL        2,000 mg 200 mL/hr over 120 Minutes Intravenous  Once 10/23/22 0843 10/23/22 1454   10/23/22 0100  cefTRIAXone (ROCEPHIN) 1 g in sodium chloride 0.9 % 100 mL IVPB  Status:  Discontinued        1 g 200 mL/hr over 30 Minutes Intravenous Every 24 hours 10/23/22 0010 10/23/22 0830   10/23/22 0100  azithromycin (ZITHROMAX) 500 mg in sodium chloride 0.9 % 250 mL IVPB  Status:  Discontinued        500 mg 250 mL/hr over 60 Minutes Intravenous Every 24 hours 10/23/22 0010 10/23/22 0830   10/22/22 2013  sodium chloride 0.9 % with cefoTEtan (CEFOTAN) ADS Med       Note to Pharmacy: Lear Ng S: cabinet override      10/22/22 2013 10/22/22 2017   10/22/22 1800  ampicillin-sulbactam (UNASYN) injection 3 g  Status:  Discontinued        3 g  Intravenous  Once 10/22/22 1758 10/22/22 1800   10/22/22 1800  Ampicillin-Sulbactam (UNASYN) 3 g in sodium chloride 0.9 % 100 mL IVPB        3 g 200 mL/hr over 30 Minutes Intravenous  Once 10/22/22 1800 10/23/22 0844        Assessment/Plan 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 Okey Dupre - Continue NPO/NGT to LIWS and await return in bowel function. Expect prolonged ileus, may need to consider TPN in a few days if he has not had return in bowel function. If pt gets extubated please keep NG in place - recommend 4 days antibiotics postop from abdominal standpoint  ID - discussed with primary and we are switching to zosyn  VTE - SCDs, sqh. Hgb 9.9 from 11.4, would wait on starting full anticoagulation until h/h stable FEN - IVF, NPO/NGT to LIWS Foley - in place  Shock - weaning pressors VDRF - weaning sedation this morning, then hopefully weaning vent Possible aspiration pneumonia - on zosyn Afib RVR - on amio drip ABL anemia Elevated troponins - ECHO pending CAD with prior CABG Hx CVA on chronic aspirin and Plavix - holding plavix and aspirin HTN HLD  I reviewed hospitalist notes, last 24 h vitals and pain scores, last 48 h intake and output, last 24 h labs and trends, and  last 24 h imaging results.   Wellington Hampshire, [facility] 10/24/2022, 9:12 AM Please see Amion for pager number during day hours 7:00am-4:30pm

## 2022-10-25 DIAGNOSIS — K460 Unspecified abdominal hernia with obstruction, without gangrene: Secondary | ICD-10-CM | POA: Diagnosis not present

## 2022-10-25 LAB — GLUCOSE, CAPILLARY
Glucose-Capillary: 115 mg/dL — ABNORMAL HIGH (ref 70–99)
Glucose-Capillary: 115 mg/dL — ABNORMAL HIGH (ref 70–99)
Glucose-Capillary: 119 mg/dL — ABNORMAL HIGH (ref 70–99)
Glucose-Capillary: 126 mg/dL — ABNORMAL HIGH (ref 70–99)
Glucose-Capillary: 146 mg/dL — ABNORMAL HIGH (ref 70–99)
Glucose-Capillary: 59 mg/dL — ABNORMAL LOW (ref 70–99)
Glucose-Capillary: 86 mg/dL (ref 70–99)
Glucose-Capillary: 88 mg/dL (ref 70–99)

## 2022-10-25 LAB — BASIC METABOLIC PANEL
Anion gap: 12 (ref 5–15)
BUN: 25 mg/dL — ABNORMAL HIGH (ref 8–23)
CO2: 23 mmol/L (ref 22–32)
Calcium: 8.3 mg/dL — ABNORMAL LOW (ref 8.9–10.3)
Chloride: 105 mmol/L (ref 98–111)
Creatinine, Ser: 0.98 mg/dL (ref 0.61–1.24)
GFR, Estimated: >60 mL/min (ref >60)
Glucose, Bld: 134 mg/dL — ABNORMAL HIGH (ref 70–99)
Potassium: 2.7 mmol/L — CL (ref 3.5–5.1)
Sodium: 140 mmol/L (ref 135–145)

## 2022-10-25 LAB — CBC
HCT: 29 % — ABNORMAL LOW (ref 39.0–52.0)
Hemoglobin: 9.5 g/dL — ABNORMAL LOW (ref 13.0–17.0)
MCH: 31.8 pg (ref 26.0–34.0)
MCHC: 32.8 g/dL (ref 30.0–36.0)
MCV: 97 fL (ref 80.0–100.0)
Platelets: 141 10*3/uL — ABNORMAL LOW (ref 150–400)
RBC: 2.99 MIL/uL — ABNORMAL LOW (ref 4.22–5.81)
RDW: 13.8 % (ref 11.5–15.5)
WBC: 9.4 10*3/uL (ref 4.0–10.5)
nRBC: 0 % (ref 0.0–0.2)

## 2022-10-25 LAB — ECHOCARDIOGRAM COMPLETE
AR max vel: 0.7 cm2
AV Area VTI: 0.63 cm2
AV Area mean vel: 0.65 cm2
AV Mean grad: 27 mmHg
AV Peak grad: 46.1 mmHg
Ao pk vel: 3.4 m/s
Area-P 1/2: 4.06 cm2
Height: 70 in
P 1/2 time: 357 msec
S' Lateral: 5.9 cm
Weight: 3132.3 oz

## 2022-10-25 LAB — PROCALCITONIN: Procalcitonin: 2.45 ng/mL

## 2022-10-25 LAB — TROPONIN I (HIGH SENSITIVITY)
Troponin I (High Sensitivity): 885 ng/L (ref <18)
Troponin I (High Sensitivity): 972 ng/L (ref <18)

## 2022-10-25 LAB — PHOSPHORUS: Phosphorus: 2.4 mg/dL — ABNORMAL LOW (ref 2.5–4.6)

## 2022-10-25 LAB — MAGNESIUM: Magnesium: 2 mg/dL (ref 1.7–2.4)

## 2022-10-25 MED ORDER — DEXTROSE 50 % IV SOLN
INTRAVENOUS | Status: AC
  Filled 2022-10-25: qty 50

## 2022-10-25 MED ORDER — INSULIN ASPART 100 UNIT/ML IJ SOLN
0.0000 [IU] | INTRAMUSCULAR | Status: DC
  Administered 2022-10-28: 1 [IU] via SUBCUTANEOUS

## 2022-10-25 MED ORDER — POTASSIUM CHLORIDE 10 MEQ/50ML IV SOLN
10.0000 meq | INTRAVENOUS | Status: AC
  Administered 2022-10-25 – 2022-10-26 (×10): 10 meq via INTRAVENOUS
  Filled 2022-10-25 (×12): qty 50

## 2022-10-25 MED ORDER — DEXTROSE 10 % IV SOLN: INTRAVENOUS | Status: DC

## 2022-10-25 MED ORDER — ASPIRIN 81 MG PO CHEW: 81.0000 mg | CHEWABLE_TABLET | Freq: Every day | ORAL | Status: DC

## 2022-10-25 MED ORDER — NOREPINEPHRINE 4 MG/250ML-% IV SOLN
INTRAVENOUS | Status: AC
  Filled 2022-10-25: qty 250

## 2022-10-25 MED ORDER — POTASSIUM PHOSPHATES 15 MMOLE/5ML IV SOLN
30.0000 mmol | Freq: Once | INTRAVENOUS | Status: AC
  Administered 2022-10-25: 30 mmol via INTRAVENOUS
  Filled 2022-10-25: qty 10

## 2022-10-25 MED ORDER — DEXTROSE 50 % IV SOLN
25.0000 g | Freq: Once | INTRAVENOUS | Status: AC
  Administered 2022-10-25: 25 g via INTRAVENOUS

## 2022-10-25 MED ORDER — MAGNESIUM SULFATE 2 GM/50ML IV SOLN
2.0000 g | Freq: Once | INTRAVENOUS | Status: AC
  Administered 2022-10-25: 2 g via INTRAVENOUS
  Filled 2022-10-25: qty 50

## 2022-10-25 MED ORDER — NOREPINEPHRINE 4 MG/250ML-% IV SOLN
0.0000 ug/min | INTRAVENOUS | Status: DC
  Administered 2022-10-25 – 2022-10-26 (×2): 3 ug/min via INTRAVENOUS
  Filled 2022-10-25: qty 250

## 2022-10-25 MED ORDER — AMIODARONE LOAD VIA INFUSION
150.0000 mg | Freq: Once | INTRAVENOUS | Status: AC
  Administered 2022-10-25: 150 mg via INTRAVENOUS
  Filled 2022-10-25: qty 83.34

## 2022-10-25 MED ORDER — ASPIRIN 300 MG RE SUPP
150.0000 mg | Freq: Every day | RECTAL | Status: DC
  Administered 2022-10-25 – 2022-10-26 (×2): 150 mg via RECTAL
  Filled 2022-10-25 (×3): qty 1

## 2022-10-25 MED ORDER — FUROSEMIDE 10 MG/ML IJ SOLN
40.0000 mg | Freq: Four times a day (QID) | INTRAMUSCULAR | Status: AC
  Administered 2022-10-25 (×2): 40 mg via INTRAVENOUS
  Filled 2022-10-25 (×2): qty 4

## 2022-10-25 MED ORDER — AMIODARONE HCL IN DEXTROSE 360-4.14 MG/200ML-% IV SOLN
30.0000 mg/h | INTRAVENOUS | Status: DC
  Administered 2022-10-25 – 2022-10-26 (×3): 30 mg/h via INTRAVENOUS
  Filled 2022-10-25 (×3): qty 200

## 2022-10-25 MED ORDER — AMIODARONE HCL IN DEXTROSE 360-4.14 MG/200ML-% IV SOLN
60.0000 mg/h | INTRAVENOUS | Status: AC
  Administered 2022-10-25 (×2): 60 mg/h via INTRAVENOUS
  Filled 2022-10-25 (×2): qty 200

## 2022-10-25 MED ORDER — AMIODARONE IV BOLUS ONLY 150 MG/100ML
INTRAVENOUS | Status: AC
  Filled 2022-10-25: qty 100

## 2022-10-25 MED ORDER — AMIODARONE LOAD VIA INFUSION
150.0000 mg | Freq: Once | INTRAVENOUS | Status: DC
  Filled 2022-10-25: qty 83.34

## 2022-10-25 MED ORDER — PIPERACILLIN-TAZOBACTAM 3.375 G IVPB
3.3750 g | Freq: Three times a day (TID) | INTRAVENOUS | Status: AC
  Administered 2022-10-25 – 2022-10-29 (×14): 3.375 g via INTRAVENOUS
  Filled 2022-10-25 (×14): qty 50

## 2022-10-25 NOTE — Progress Notes (Addendum)
NAME:  Craig Fields, MRN:  [id number], DOB:  04-15-1941, LOS: 3 ADMISSION DATE:  10/22/2022, CONSULTATION DATE:  2/10 REFERRING MD:  Manuella Ghazi, CHIEF COMPLAINT: Respiratory failure and septic shock  History of Present Illness:  82 year old male patient with history as mentioned below most significantly known Ventral hernia.  Admitted on 2/9 with abdominal pain, nausea and vomiting CT abdomen showed ventral hernia with small bowel loops and surrounding soft tissue edema concerning for incarcerated hernia and resultant small bowel obstruction, he is brought emergently to the operating room where he underwent exploratory laparotomy, ventral hernia repair, and bowel resection.  Postoperative course complicated by ongoing hypotension, requiring norepinephrine in spite of volume resuscitation, ongoing ventilator dependence.  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 Went for exploration laparotomy with small bowel resection and ventral hernia repair on 2/9 for strangulated ventral hernia resulting in necrotic small bowel. 80 cm of small bowel resected  2/10 transferred to [facility] for on-going NE needs and need for Mechanical ventilation  2/11 trops mildly elevated, pressor stable   Interim History / Subjective:  Doing okay. Some new redness along L cheek. Denies pain Some dyspnea. Daughter at bedside.  Objective   Blood pressure (!) 121/52, pulse 75, temperature (!) 97.3 F (36.3 C), temperature source Oral, resp. rate (!) 22, height 5' 10"$  (1.778 m), weight 88.8 kg, SpO2 99 %. CVP:  [4 mmHg-5 mmHg] 4 mmHg  FiO2 (%):  [36 %] 36 %   Intake/Output Summary (Last 24 hours)  at 10/25/2022 0757 Last data filed at 10/25/2022 0500 Gross per 24 hour  Intake 1203.67 ml  Output 1760 ml  Net -556.33 ml    Filed Weights   10/22/22 1137 10/22/22 2229  Weight: 84.8 kg 88.8 kg    Examination: No distress Mild erythema L cheek, nontender NGT dark bilious fluid CVL CDI Aline CDI Moves ext to command with exception LUE (CVA), LLE is also weaker than RLE (chronic) Ext warm, mild anasarca Abd soft, incision site looks okay, hypoactive BS Aox3, pleasant  K being repleted Cr neg All cell lines down slightly CXR bilateral pneumonia  Resolved Hospital Problem list   Lactic acidosis resolved  Assessment & Plan:  S/P exploratory lap 2/9 for strangulated ventral hernia resulting in necrotic small bowel. 80 cm of small bowel resected  Postoperative shock state- improved but now back on levo after recurrence of afib Postoperative ileus- awaiting return of bowel function Perioperative aspiration event- off vent 2/11 Afib/RVR, troponemia, hx CABG- EF looks a bit down on echo, formal read pending, no recurrence, in context of shock, vasopressors, ischemic gut, aspiration there is no immediate indication for cath but can consider as he recovers; cannot do any DoAC due to gut Volume overloaded state of heart Hx CVA with left sided weakness Hx OSA, HLD, HTN L cheek redness- keep eye on clinically  - PT/OT, progressive mobility - Start diuresis, stop IVF - f/u echo read and consider cardiology consult at some point - Levo for MAP 65 - Amio PRN - Rectal aspirin - postoperative incision care, bowel activity per CCS, may need TPN, keep CVL for now - Zosyn until 10/29/22  Best Practice (right click and "Reselect all SmartList  Selections" daily)   Diet/type: NPO DVT prophylaxis: prophylactic heparin  GI prophylaxis: PPI Lines: Central line and yes and it is still needed Foley:  Yes, and it is still needed Code Status:  full code Last date of multidisciplinary goals of  care discussion [Family at bedside]  31 min cc time  Erskine Emery MD PCCM

## 2022-10-25 NOTE — Progress Notes (Signed)
OT Cancellation Note  Patient Details Name: Craig Fields MRN: [id number] DOB: Sep 29, 1940   Cancelled Treatment:    Reason Eval/Treat Not Completed: Medical issues which prohibited therapy (new onset tachycardia, RN requesting therapy evaluations hold.)  Elliot Cousin 10/25/2022, 11:54 AM

## 2022-10-25 NOTE — Progress Notes (Signed)
3 Days Post-Op   Subjective/Chief Complaint: Extubated, no flatus/bm   Objective: Vital signs in last 24 hours: Temp:  [97.3 F (36.3 C)-98.4 F (36.9 C)] 97.3 F (36.3 C) (02/12 0739) Pulse Rate:  [59-84] 77 (02/12 0800) Resp:  [18-27] 24 (02/12 0800) BP: (98-137)/(46-59) 120/52 (02/12 0800) SpO2:  [95 %-100 %] 100 % (02/12 0800) Arterial Line BP: (94-139)/(40-59) 126/43 (02/12 0800) FiO2 (%):  [36 %] 36 % (02/11 0948) Last BM Date :  (PTA)  Intake/Output from previous day: 02/11 0701 - 02/12 0700 In: 1262.6 [I.V.:788.3; IV Piggyback:474.3] Out: 1760 [Urine:710; Emesis/NG output:1050] Intake/Output this shift: Total I/O In: 1171.5 [I.V.:1049.6; IV Piggyback:121.9] Out: -   Ab mild distended approp tender dressing in place  Lab Results:  Recent Labs    10/24/22 0206 10/25/22 0154  WBC 10.9* 9.4  HGB 9.9* 9.5*  HCT 30.0* 29.0*  PLT 166 141*   BMET Recent Labs    10/24/22 0206 10/24/22 1700  NA 138 137  K 3.0* 3.5  CL 109 109  CO2 19* 20*  GLUCOSE 156* 110*  BUN 24* 25*  CREATININE 0.90 0.79  CALCIUM 8.4* 8.5*   PT/INR Recent Labs    10/24/22 0206  LABPROT 16.9*  INR 1.4*   ABG Recent Labs    10/22/22 2256  PHART 7.43  HCO3 27.2    Studies/Results: DG CHEST PORT 1 VIEW  Result Date: 10/24/2022 CLINICAL DATA:  Ventilator dependence. EXAM: PORTABLE CHEST 1 VIEW COMPARISON:  10/23/2022 FINDINGS: Endotracheal tube tip is 5.3 cm above the base of the carina. NG tube visualized although course through the lower chest is not CRNA April Manson due to technical factors. Right IJ central line tip overlies the mid SVC level. The cardio pericardial silhouette is enlarged. Bibasilar airspace disease evident with some airspace opacity noted left mid lung. Telemetry leads overlie the chest. IMPRESSION: 1. Endotracheal tube tip is 5.3 cm above the base of the carina. 2. Bibasilar and left mid lung airspace disease. Airspace opacity in the left parahilar mid lung  appears progressive in the interval. Electronically Signed   By: Misty Stanley M.D.   On: 10/24/2022 07:53   DG CHEST PORT 1 VIEW  Result Date: 10/23/2022 CLINICAL DATA:  Central line placement.  Endotracheal tube present. EXAM: PORTABLE CHEST 1 VIEW COMPARISON:  Prior today FINDINGS: New right jugular central venous catheter seen with tip overlying the superior cavoatrial junction. No evidence of pneumothorax. Endotracheal tube and gastric tube remain in place. Stable cardiomegaly. Stable interstitial infiltrates, consistent with interstitial edema. No evidence of pulmonary consolidation or pleural effusion. Prior CABG again noted. IMPRESSION: New right jugular central venous catheter in appropriate position. No evidence of pneumothorax. Stable cardiomegaly and mild interstitial edema. Electronically Signed   By: Marlaine Hind M.D.   On: 10/23/2022 11:15    Anti-infectives: Anti-infectives (From admission, onward)    Start     Dose/Rate Route Frequency Ordered Stop   10/25/22 1400  piperacillin-tazobactam (ZOSYN) IVPB 3.375 g        3.375 g 12.5 mL/hr over 240 Minutes Intravenous Every 8 hours 10/25/22 0805 10/30/22 0559   10/24/22 1000  vancomycin (VANCOREADY) IVPB 1500 mg/300 mL  Status:  Discontinued        1,500 mg 150 mL/hr over 120 Minutes Intravenous Every 24 hours 10/23/22 0853 10/24/22 0900   10/24/22 1000  piperacillin-tazobactam (ZOSYN) IVPB 3.375 g  Status:  Discontinued        3.375 g 12.5 mL/hr over 240 Minutes Intravenous  Every 6 hours 10/24/22 0905 10/25/22 0805   10/24/22 0600  cefoTEtan (CEFOTAN) 2 g in sodium chloride 0.9 % 100 mL IVPB  Status:  Discontinued        2 g 200 mL/hr over 30 Minutes Intravenous On call to O.R. 10/23/22 1705 10/24/22 0900   10/23/22 0930  ceFEPIme (MAXIPIME) 2 g in sodium chloride 0.9 % 100 mL IVPB  Status:  Discontinued        2 g 200 mL/hr over 30 Minutes Intravenous Every 12 hours 10/23/22 0841 10/24/22 0900   10/23/22 0930  vancomycin  (VANCOREADY) IVPB 2000 mg/400 mL        2,000 mg 200 mL/hr over 120 Minutes Intravenous  Once 10/23/22 0843 10/23/22 1454   10/23/22 0100  cefTRIAXone (ROCEPHIN) 1 g in sodium chloride 0.9 % 100 mL IVPB  Status:  Discontinued        1 g 200 mL/hr over 30 Minutes Intravenous Every 24 hours 10/23/22 0010 10/23/22 0830   10/23/22 0100  azithromycin (ZITHROMAX) 500 mg in sodium chloride 0.9 % 250 mL IVPB  Status:  Discontinued        500 mg 250 mL/hr over 60 Minutes Intravenous Every 24 hours 10/23/22 0010 10/23/22 0830   10/22/22 2013  sodium chloride 0.9 % with cefoTEtan (CEFOTAN) ADS Med       Note to Pharmacy: Lear Ng S: cabinet override      10/22/22 2013 10/22/22 2017   10/22/22 1800  ampicillin-sulbactam (UNASYN) injection 3 g  Status:  Discontinued        3 g Intravenous  Once 10/22/22 1758 10/22/22 1800   10/22/22 1800  Ampicillin-Sulbactam (UNASYN) 3 g in sodium chloride 0.9 % 100 mL IVPB        3 g 200 mL/hr over 30 Minutes Intravenous  Once 10/22/22 1800 10/23/22 0844       Assessment/Plan: Strangulated ventral hernia causing small bowel obstruction, necrotic small bowel  -POD#3 s/p Exploratory laparotomy, primary ventral hernia repair, small bowel resection  10/22/22 Dr. Okey Dupre [facility] - Continue NPO/NGT to LIWS and await return in bowel function. Expect prolonged ileus, may need to consider TPN in a few days if he has not had return in bowel function. If pt gets extubated please keep NG in place - recommend 4 days antibiotics postop from abdominal standpoint   ID -zosyn four days postop VTE - SCDs, sqh. Would like to wait 24 hours prior to anticoagulating but if need to for troponins can do that with heparin FEN - IVF, NPO/NGT to LIWS Foley - in place   I reviewed last 24 h vitals and pain scores, last 48 h intake and output, last 24 h labs and trends, and last 24 h imaging results.reviewed surgery from [facility], ccm notes and xrays as well.    This care required moderate  level of medical decision making.   Rolm Bookbinder 10/25/2022

## 2022-10-25 NOTE — Progress Notes (Signed)
PT Cancellation Note  Patient Details Name: Craig Fields MRN: [id number] DOB: 1940/10/26   Cancelled Treatment:    Reason Eval/Treat Not Completed: Medical issues which prohibited therapy - new onset tachycardia, PT requesting PT hold.  Stacie Glaze, PT DPT Acute Rehabilitation Services Pager [phone]  Office [phone]    Louis Matte 10/25/2022, 11:31 AM

## 2022-10-25 NOTE — Progress Notes (Signed)
[facility] Progress Note Patient Name: Craig Fields DOB: 08-01-1941 MRN: [id number]   Date of Service  10/25/2022  HPI/Events of Note  K+ 2.7,  Phos 2.4  @ 1851 Crt 0.98  He is NPO and has a central line   eICU Interventions  Kphos and KCL ordered     Intervention Category Intermediate Interventions: Electrolyte abnormality - evaluation and management  Fabrice Dyal 10/25/2022, 8:29 PM

## 2022-10-26 DIAGNOSIS — K460 Unspecified abdominal hernia with obstruction, without gangrene: Secondary | ICD-10-CM | POA: Diagnosis not present

## 2022-10-26 DIAGNOSIS — I5020 Unspecified systolic (congestive) heart failure: Secondary | ICD-10-CM | POA: Diagnosis not present

## 2022-10-26 DIAGNOSIS — I4891 Unspecified atrial fibrillation: Secondary | ICD-10-CM

## 2022-10-26 LAB — GLUCOSE, CAPILLARY
Glucose-Capillary: 103 mg/dL — ABNORMAL HIGH (ref 70–99)
Glucose-Capillary: 105 mg/dL — ABNORMAL HIGH (ref 70–99)
Glucose-Capillary: 109 mg/dL — ABNORMAL HIGH (ref 70–99)
Glucose-Capillary: 116 mg/dL — ABNORMAL HIGH (ref 70–99)
Glucose-Capillary: 138 mg/dL — ABNORMAL HIGH (ref 70–99)
Glucose-Capillary: 95 mg/dL (ref 70–99)

## 2022-10-26 LAB — BASIC METABOLIC PANEL
Anion gap: 12 (ref 5–15)
BUN: 25 mg/dL — ABNORMAL HIGH (ref 8–23)
CO2: 23 mmol/L (ref 22–32)
Calcium: 8.2 mg/dL — ABNORMAL LOW (ref 8.9–10.3)
Chloride: 104 mmol/L (ref 98–111)
Creatinine, Ser: 0.99 mg/dL (ref 0.61–1.24)
GFR, Estimated: >60 mL/min (ref >60)
Glucose, Bld: 142 mg/dL — ABNORMAL HIGH (ref 70–99)
Potassium: 3.5 mmol/L (ref 3.5–5.1)
Sodium: 139 mmol/L (ref 135–145)

## 2022-10-26 LAB — COOXEMETRY PANEL
Carboxyhemoglobin: 2.5 % — ABNORMAL HIGH (ref 0.5–1.5)
Methemoglobin: 0.8 % (ref 0.0–1.5)
O2 Saturation: 64.5 %
Total hemoglobin: 9.4 g/dL — ABNORMAL LOW (ref 12.0–16.0)

## 2022-10-26 LAB — PHOSPHORUS: Phosphorus: 2.7 mg/dL (ref 2.5–4.6)

## 2022-10-26 LAB — HEPARIN LEVEL (UNFRACTIONATED): Heparin Unfractionated: 0.36 IU/mL (ref 0.30–0.70)

## 2022-10-26 LAB — SURGICAL PATHOLOGY

## 2022-10-26 MED ORDER — POTASSIUM CHLORIDE 10 MEQ/100ML IV SOLN: 10.0000 meq | INTRAVENOUS | Status: DC

## 2022-10-26 MED ORDER — HEPARIN (PORCINE) 25000 UT/250ML-% IV SOLN
1300.0000 [IU]/h | INTRAVENOUS | Status: DC
  Administered 2022-10-26: 1250 [IU]/h via INTRAVENOUS
  Administered 2022-10-27 (×3): 1300 [IU]/h via INTRAVENOUS
  Filled 2022-10-26 (×3): qty 250

## 2022-10-26 MED ORDER — POTASSIUM CHLORIDE 10 MEQ/50ML IV SOLN
10.0000 meq | INTRAVENOUS | Status: AC
  Administered 2022-10-26 (×5): 10 meq via INTRAVENOUS
  Filled 2022-10-26 (×5): qty 50

## 2022-10-26 MED ORDER — MAGNESIUM SULFATE 2 GM/50ML IV SOLN
2.0000 g | Freq: Once | INTRAVENOUS | Status: AC
  Administered 2022-10-26: 2 g via INTRAVENOUS
  Filled 2022-10-26: qty 50

## 2022-10-26 MED ORDER — HEPARIN BOLUS VIA INFUSION
4400.0000 [IU] | Freq: Once | INTRAVENOUS | Status: AC
  Administered 2022-10-26: 4400 [IU] via INTRAVENOUS
  Filled 2022-10-26: qty 4400

## 2022-10-26 MED ORDER — ORAL CARE MOUTH RINSE: 15.0000 mL | OROMUCOSAL | Status: DC | PRN

## 2022-10-26 MED ORDER — FUROSEMIDE 10 MG/ML IJ SOLN
40.0000 mg | Freq: Four times a day (QID) | INTRAMUSCULAR | Status: AC
  Administered 2022-10-26 (×2): 40 mg via INTRAVENOUS
  Filled 2022-10-26 (×2): qty 4

## 2022-10-26 MED ORDER — ALBUMIN HUMAN 25 % IV SOLN
25.0000 g | Freq: Four times a day (QID) | INTRAVENOUS | Status: AC
  Administered 2022-10-26 – 2022-10-27 (×4): 25 g via INTRAVENOUS
  Filled 2022-10-26 (×4): qty 100

## 2022-10-26 NOTE — Evaluation (Signed)
Occupational Therapy Evaluation Patient Details Name: Craig Fields MRN: [id number] DOB: August 12, 1941 Today's Date: 10/26/2022   History of Present Illness 82 yo admitted 2/9 with incarcerated hernia causing SBO s/p ex lap same date at [facility]. Transferred to [facility] 2/10 with septic shock and Afib. Extubated 2/11. PMhx: HLD, obesity, HTN, CVA with hemiplegia, colon CA, sleep apnea   Clinical Impression   Boleslaus was evaluated s/p the above admission list, per report he is typically able to ambulate household distances with a quad cane and has minimal assist for ADLs from family as needed. Upon evaluation he was limited by multiple lines, baseline L hemibody contractures, weakness, posterior/R lateral lean, pain and decreased activity tolerance. Overall he needed max A -total A +2 for bed mobility and standing attempts. In sitting he also needed max A for balance, unable to self-correct LOB. Due to the deficits listed below, he requires min-total A for ADLs at bed level. OT to continue to follow acutely. Recommend d/c to SNF for continued therapy.      Recommendations for follow up therapy are one component of a multi-disciplinary discharge planning process, led by the attending physician.  Recommendations may be updated based on patient status, additional functional criteria and insurance authorization.   Follow Up Recommendations  Skilled nursing-short term rehab (<3 hours/day)     Assistance Recommended at Discharge Frequent or constant Supervision/Assistance  Patient can return home with the following A lot of help with walking and/or transfers;Two people to help with walking and/or transfers;Two people to help with bathing/dressing/bathroom;A lot of help with bathing/dressing/bathroom;Direct supervision/assist for medications management;Direct supervision/assist for financial management;Assist for transportation;Help with stairs or ramp for entrance    Functional Status Assessment  Patient  has had a recent decline in their functional status and demonstrates the ability to make significant improvements in function in a reasonable and predictable amount of time.  Equipment Recommendations  None recommended by OT (defer)       Precautions / Restrictions Precautions Precautions: Fall;Other (comment) Precaution Comments: art line, CVP, left hand contracted Required Braces or Orthoses: Other Brace Other Brace: bil built up shoes, boot on left Restrictions Weight Bearing Restrictions: No      Mobility Bed Mobility Overal bed mobility: Needs Assistance Bed Mobility: Supine to Sit, Sit to Supine     Supine to sit: Total assist, +2 for physical assistance Sit to supine: Total assist, +2 for physical assistance   General bed mobility comments: pt with total +2 to roll, pivot to EOB with HOB 35 degrees and return to bed. Max assist for sitting balance due to posterior right lean    Transfers Overall transfer level: Needs assistance   Transfers: Sit to/from Stand Sit to Stand: From elevated surface, Max assist, +2 physical assistance           General transfer comment: max +2 with bil knees blocked and use of pad to rise from surface and shift hips toward EOB      Balance Overall balance assessment: Needs assistance   Sitting balance-Leahy Scale: Zero Sitting balance - Comments: max assist for sitting balance Postural control: Posterior lean, Right lateral lean             ADL either performed or assessed with clinical judgement   ADL Overall ADL's : Needs assistance/impaired Eating/Feeding: NPO   Grooming: Bed level;Maximal assistance   Upper Body Bathing: Maximal assistance;Bed level   Lower Body Bathing: Total assistance   Upper Body Dressing : Maximal assistance;Sitting  Lower Body Dressing: Total assistance   Toilet Transfer: Total assistance   Toileting- Clothing Manipulation and Hygiene: Total assistance       Functional mobility  during ADLs: Total assistance;+2 for physical assistance;+2 for safety/equipment (bed level) General ADL Comments: weakness, posterior/R lateral lean in sitting     Vision Baseline Vision/History: 1 Wears glasses Vision Assessment?: No apparent visual deficits     Perception Perception Perception Tested?: No   Praxis Praxis Praxis tested?: Not tested    Pertinent Vitals/Pain Pain Assessment Pain Assessment: Faces Faces Pain Scale: Hurts little more Pain Location: LLE with touch and movement- reports soreness since fall at [place] Pain Descriptors / Indicators: Aching, Grimacing Pain Intervention(s): Limited activity within patient's tolerance, Monitored during session     Hand Dominance Right   Extremity/Trunk Assessment Upper Extremity Assessment Upper Extremity Assessment: RUE deficits/detail;LUE deficits/detail RUE Deficits / Details: multiple lines, edematous. globally weak RUE Coordination: decreased fine motor;decreased gross motor LUE Deficits / Details: contracted, does not wear brace per report LUE Sensation: decreased proprioception;decreased light touch LUE Coordination: decreased fine motor;decreased gross motor   Lower Extremity Assessment Lower Extremity Assessment: Defer to PT evaluation RLE Deficits / Details: grossly 2/5 LLE Deficits / Details: no significant AROM noted with total assist to don/doff boot   Cervical / Trunk Assessment Cervical / Trunk Assessment: Kyphotic Cervical / Trunk Exceptions: posterior right lean in sitting   Communication Communication Communication: HOH   Cognition Arousal/Alertness: Awake/alert Behavior During Therapy: Flat affect Overall Cognitive Status: Impaired/Different from baseline Area of Impairment: Memory, Following commands, Awareness, Problem solving, Orientation                 Orientation Level: Time   Memory: Decreased short-term memory Following Commands: Follows one step commands  inconsistently, Follows one step commands with increased time     Problem Solving: Slow processing, Requires verbal cues, Requires tactile cues, Difficulty sequencing, Decreased initiation       General Comments  VSS, family present and supportive     Home Living Family/patient expects to be discharged to:: Private residence Living Arrangements: Spouse/significant other Available Help at Discharge: Family;Available 24 hours/day Type of Home: House Home Access: Ramped entrance     Home Layout: One level         Bathroom Toilet: Handicapped height     Home Equipment: [place] - quad;Shower seat;BSC/3in1;Wheelchair - manual          Prior Functioning/Environment Prior Level of Function : Needs assist       Physical Assist : ADLs (physical);Mobility (physical)     Mobility Comments: pt reports he could get out of regular bed and stand without assist, walks about 15' at a time with quad cane. WC for community ADLs Comments: wife or family assist with bathing/dressing and perform all IADLS        OT Problem List: Decreased strength;Decreased range of motion;Decreased activity tolerance;Impaired balance (sitting and/or standing);Decreased safety awareness;Decreased knowledge of use of DME or AE;Decreased knowledge of precautions;Increased edema      OT Treatment/Interventions: Self-care/ADL training;Therapeutic exercise;DME and/or AE instruction;Therapeutic activities;Patient/family education;Balance training    OT Goals(Current goals can be found in the care plan section) Acute Rehab OT Goals Patient Stated Goal: to get better OT Goal Formulation: With patient Time For Goal Achievement: 11/09/22 Potential to Achieve Goals: Good ADL Goals Pt Will Perform Grooming: with min assist;sitting Pt Will Perform Upper Body Dressing: with min assist;sitting Pt Will Transfer to Toilet: with max assist;stand pivot transfer;bedside commode Additional  ADL Goal #1: Pt will complete  bed mobility with mod A as a precursor to ADLs Additional ADL Goal #2: Pt will tolerate unsupported sitting for 5 minutes with min G as a precursor to EOB tasks  OT Frequency: Min 2X/week    Co-evaluation PT/OT/SLP Co-Evaluation/Treatment: Yes Reason for Co-Treatment: Complexity of the patient's impairments (multi-system involvement);For patient/therapist safety;To address functional/ADL transfers   OT goals addressed during session: ADL's and self-care      AM-PAC OT "6 Clicks" Daily Activity     Outcome Measure Help from another person eating meals?: Total Help from another person taking care of personal grooming?: A Lot Help from another person toileting, which includes using toliet, bedpan, or urinal?: Total Help from another person bathing (including washing, rinsing, drying)?: A Lot Help from another person to put on and taking off regular upper body clothing?: A Lot Help from another person to put on and taking off regular lower body clothing?: Total 6 Click Score: 9   End of Session Nurse Communication: Mobility status  Activity Tolerance: Patient tolerated treatment well Patient left: in bed;with call bell/phone within reach;with bed alarm set;with family/visitor present  OT Visit Diagnosis: Unsteadiness on feet (R26.81);Other abnormalities of gait and mobility (R26.89);Muscle weakness (generalized) (M62.81);Pain                Time: 1010-1038 OT Time Calculation (min): 28 min Charges:  OT General Charges $OT Visit: 1 Visit OT Evaluation $OT Eval Moderate Complexity: 1 Mod  Shade Flood, OTR/L [place] [facility] [place] Communication Preferred   Elliot Cousin 10/26/2022, 12:47 PM

## 2022-10-26 NOTE — Progress Notes (Signed)
4 Days Post-Op   Subjective/Chief Complaint: Extubated, endorses flatus and had a moderate to large soft BM this AM. NG output mostly clear < 300 cc in cannister  Objective: Vital signs in last 24 hours: Temp:  [97.8 F (36.6 C)-98.5 F (36.9 C)] 98.4 F (36.9 C) (02/13 0738) Pulse Rate:  [68-144] 77 (02/13 0805) Resp:  [19-24] 22 (02/12 1800) BP: (105-124)/(43-70) 115/47 (02/13 0805) SpO2:  [89 %-100 %] 97 % (02/13 0805) Arterial Line BP: (96-134)/(33-50) 134/49 (02/13 0805) Last BM Date :  (PTA)  Intake/Output from previous day: 02/12 0701 - 02/13 0700 In: 3029.8 [I.V.:1877.5; IV Piggyback:1152.3] Out: 3025 [Urine:3025] Intake/Output this shift: No intake/output data recorded.  Abd soft, appropriately tender, non-distended  Lab Results:  Recent Labs    10/24/22 0206 10/25/22 0154  WBC 10.9* 9.4  HGB 9.9* 9.5*  HCT 30.0* 29.0*  PLT 166 141*   BMET Recent Labs    10/25/22 1851 10/26/22 0545  NA 140 139  K 2.7* 3.5  CL 105 104  CO2 23 23  GLUCOSE 134* 142*  BUN 25* 25*  CREATININE 0.98 0.99  CALCIUM 8.3* 8.2*   PT/INR Recent Labs    10/24/22 0206  LABPROT 16.9*  INR 1.4*   ABG No results for input(s): "PHART", "HCO3" in the last 72 hours.  Invalid input(s): "PCO2", "PO2"   Studies/Results: ECHOCARDIOGRAM COMPLETE  Result Date: 10/25/2022    ECHOCARDIOGRAM REPORT   Patient Name:   Craig Fields Date of Exam: 10/24/2022 Medical Rec #:  [id number]     Height:       70.0 in Accession #:    [id number]    Weight:       195.8 lb Date of Birth:  [birth year]     BSA:          2.068 m Patient Age:    82 years      BP:           112/49 mmHg Patient Gender: M             HR:           61 bpm. Exam Location:  Inpatient Procedure: 2D Echo, Cardiac Doppler, Color Doppler and Intracardiac            Opacification Agent Indications:     [facility]) [id number]  History:         Patient has prior history of Echocardiogram examinations, most                  recent  09/11/2021. CAD, Prior CABG, Stroke,                  Signs/Symptoms:Hypotension; Risk Factors:Dyslipidemia and                  Hypertension.  Sonographer:     Greer Pickerel Referring Phys:  [place] Diagnosing Phys: Cherlynn Kaiser MD  Sonographer Comments: Image acquisition challenging due to patient body habitus and Image acquisition challenging due to respiratory motion. IMPRESSIONS  1. Left ventricular ejection fraction, by estimation, is 20 to 25%. The left ventricle has severely decreased function. The left ventricle demonstrates regional wall motion abnormalities (see scoring diagram/findings for description). Left ventricular diastolic parameters are consistent with Grade II diastolic dysfunction (pseudonormalization).  2. Right ventricular systolic function is mildly reduced. The right ventricular size is normal. There is mildly elevated pulmonary artery systolic pressure. The estimated right ventricular systolic pressure is 123XX123 mmHg.  3. Left atrial size was severely dilated.  4. Right atrial size was mildly dilated.  5. The mitral valve is degenerative. Mild mitral valve regurgitation. No evidence of mitral stenosis.  6. The aortic valve is abnormal. There is severe calcifcation of the aortic valve. Aortic valve regurgitation is moderate. Moderate aortic valve stenosis. Aortic valve area, by VTI measures 0.63 cm. Aortic valve mean gradient measures 27.0 mmHg. Aortic  valve Vmax measures 3.40 m/s. DI 0.20, SVI 24 ml/m2, possible low flow low gradient AS.  7. The inferior vena cava is dilated in size with <50% respiratory variability, suggesting right atrial pressure of 15 mmHg. FINDINGS  Left Ventricle: Left ventricular ejection fraction, by estimation, is 20 to 25%. The left ventricle has severely decreased function. The left ventricle demonstrates regional wall motion abnormalities. Definity contrast agent was given IV to delineate the left ventricular endocardial borders. The left  ventricular internal cavity size was normal in size. There is no left ventricular hypertrophy. Left ventricular diastolic parameters are consistent with Grade II diastolic dysfunction (pseudonormalization).  LV Wall Scoring: The anterior septum, mid inferoseptal segment, and basal inferoseptal segment are akinetic. The entire anterior wall, entire lateral wall, entire inferior wall, and apical septal segment are hypokinetic. Right Ventricle: The right ventricular size is normal. No increase in right ventricular wall thickness. Right ventricular systolic function is mildly reduced. There is mildly elevated pulmonary artery systolic pressure. The tricuspid regurgitant velocity  is 2.45 m/s, and with an assumed right atrial pressure of 15 mmHg, the estimated right ventricular systolic pressure is 123XX123 mmHg. Left Atrium: Left atrial size was severely dilated. Right Atrium: Right atrial size was mildly dilated. Pericardium: Trivial pericardial effusion is present. Mitral Valve: The mitral valve is degenerative in appearance. Mild to moderate mitral annular calcification. Mild mitral valve regurgitation. No evidence of mitral valve stenosis. Tricuspid Valve: The tricuspid valve is normal in structure. Tricuspid valve regurgitation is mild . No evidence of tricuspid stenosis. Aortic Valve: The aortic valve is abnormal. There is severe calcifcation of the aortic valve. Aortic valve regurgitation is moderate. Aortic regurgitation PHT measures 357 msec. Moderate aortic stenosis is present. Aortic valve mean gradient measures 27.0 mmHg. Aortic valve peak gradient measures 46.1 mmHg. Aortic valve area, by VTI measures 0.63 cm. Pulmonic Valve: The pulmonic valve was normal in structure. Pulmonic valve regurgitation is mild. No evidence of pulmonic stenosis. Aorta: The aortic root is normal in size and structure. Ascending aorta measurements are within normal limits for age when indexed to body surface area. Venous: The inferior  vena cava is dilated in size with less than 50% respiratory variability, suggesting right atrial pressure of 15 mmHg. IAS/Shunts: No atrial level shunt detected by color flow Doppler.  LEFT VENTRICLE PLAX 2D LVIDd:         6.30 cm   Diastology LVIDs:         5.90 cm   LV e' medial:    4.22 cm/s LV PW:         1.00 cm   LV E/e' medial:  34.8 LV IVS:        0.90 cm   LV e' lateral:   7.54 cm/s LVOT diam:     2.00 cm   LV E/e' lateral: 19.5 LV SV:         50 LV SV Index:   24 LVOT Area:     3.14 cm  RIGHT VENTRICLE RV S prime:     6.49 cm/s TAPSE (M-mode): 1.2 cm  LEFT ATRIUM              Index        RIGHT ATRIUM           Index LA diam:        4.60 cm  2.22 cm/m   RA Area:     26.70 cm LA Vol (A2C):   90.3 ml  43.66 ml/m  RA Volume:   75.90 ml  36.69 ml/m LA Vol (A4C):   140.0 ml 67.68 ml/m LA Biplane Vol: 115.0 ml 55.60 ml/m  AORTIC VALVE                     PULMONIC VALVE AV Area (Vmax):    0.70 cm      PR End Diast Vel: 6.50 msec AV Area (Vmean):   0.65 cm AV Area (VTI):     0.63 cm AV Vmax:           339.50 cm/s AV Vmean:          240.000 cm/s AV VTI:            0.799 m AV Peak Grad:      46.1 mmHg AV Mean Grad:      27.0 mmHg LVOT Vmax:         76.00 cm/s LVOT Vmean:        49.800 cm/s LVOT VTI:          0.159 m LVOT/AV VTI ratio: 0.20 AI PHT:            357 msec  AORTA Ao Root diam: 3.90 cm Ao Asc diam:  3.90 cm MITRAL VALVE                TRICUSPID VALVE MV Area (PHT): 4.06 cm     TV Peak grad:   34.1 mmHg MV Decel Time: 187 msec     TV Vmax:        2.92 m/s MV E velocity: 147.00 cm/s  TR Peak grad:   24.0 mmHg MV A velocity: 75.10 cm/s   TR Vmax:        245.00 cm/s MV E/A ratio:  1.96                             SHUNTS                             Systemic VTI:  0.16 m                             Systemic Diam: 2.00 cm Cherlynn Kaiser MD Electronically signed by Cherlynn Kaiser MD Signature Date/Time: 10/25/2022/10:22:02 AM    Final (Updated)     Anti-infectives: Anti-infectives (From admission,  onward)    Start     Dose/Rate Route Frequency Ordered Stop   10/25/22 1400  piperacillin-tazobactam (ZOSYN) IVPB 3.375 g        3.375 g 12.5 mL/hr over 240 Minutes Intravenous Every 8 hours 10/25/22 0805 10/30/22 0559   10/24/22 1000  vancomycin (VANCOREADY) IVPB 1500 mg/300 mL  Status:  Discontinued        1,500 mg 150 mL/hr over 120 Minutes Intravenous Every 24 hours 10/23/22 0853 10/24/22 0900   10/24/22 1000  piperacillin-tazobactam (ZOSYN) IVPB 3.375 g  Status:  Discontinued  3.375 g 12.5 mL/hr over 240 Minutes Intravenous Every 6 hours 10/24/22 0905 10/25/22 0805   10/24/22 0600  cefoTEtan (CEFOTAN) 2 g in sodium chloride 0.9 % 100 mL IVPB  Status:  Discontinued        2 g 200 mL/hr over 30 Minutes Intravenous On call to O.R. 10/23/22 1705 10/24/22 0900   10/23/22 0930  ceFEPIme (MAXIPIME) 2 g in sodium chloride 0.9 % 100 mL IVPB  Status:  Discontinued        2 g 200 mL/hr over 30 Minutes Intravenous Every 12 hours 10/23/22 0841 10/24/22 0900   10/23/22 0930  vancomycin (VANCOREADY) IVPB 2000 mg/400 mL        2,000 mg 200 mL/hr over 120 Minutes Intravenous  Once 10/23/22 0843 10/23/22 1454   10/23/22 0100  cefTRIAXone (ROCEPHIN) 1 g in sodium chloride 0.9 % 100 mL IVPB  Status:  Discontinued        1 g 200 mL/hr over 30 Minutes Intravenous Every 24 hours 10/23/22 0010 10/23/22 0830   10/23/22 0100  azithromycin (ZITHROMAX) 500 mg in sodium chloride 0.9 % 250 mL IVPB  Status:  Discontinued        500 mg 250 mL/hr over 60 Minutes Intravenous Every 24 hours 10/23/22 0010 10/23/22 0830   10/22/22 2013  sodium chloride 0.9 % with cefoTEtan (CEFOTAN) ADS Med       Note to Pharmacy: Lear Ng S: cabinet override      10/22/22 2013 10/22/22 2017   10/22/22 1800  ampicillin-sulbactam (UNASYN) injection 3 g  Status:  Discontinued        3 g Intravenous  Once 10/22/22 1758 10/22/22 1800   10/22/22 1800  Ampicillin-Sulbactam (UNASYN) 3 g in sodium chloride 0.9 % 100 mL IVPB         3 g 200 mL/hr over 30 Minutes Intravenous  Once 10/22/22 1800 10/23/22 0844       Assessment/Plan: Strangulated ventral hernia causing small bowel obstruction, necrotic small bowel  -POD#4 s/p Exploratory laparotomy, primary ventral hernia repair, small bowel resection  10/22/22 Dr. Okey Dupre [facility] - having bowel function, abdomen soft, low-put from NG that is clear. D/C NGT and start clears.  - recommend 4 days antibiotics postop from abdominal standpoint   ID -zosyn four days postop VTE - SCDs, sqh. Would like to wait 24 hours prior to anticoagulating but if need to for troponins can do that with heparin FEN - IVF, CLD  Foley - in place   I reviewed last 24 h vitals and pain scores, last 48 h intake and output, last 24 h labs and trends, and last 24 h imaging results.reviewed surgery from [facility], ccm notes and xrays as well.    This care required moderate level of medical decision making.   Darci Current Nila Winker PA-C 10/26/2022

## 2022-10-26 NOTE — Consult Note (Addendum)
Cardiology Consultation   Patient ID: Craig Fields MRN: [id number]; DOB: December 02, 1940  Admit date: 10/22/2022 Date of Consult: 10/26/2022  PCP:  Fayrene Helper, MD   [place] Prov[facility] Cardiologist:  Rozann Lesches, MD        Patient Profile:   Craig Fields is a 82 y.o. male with a hx of aortic stenosis, CAD s/p CABG, CVA on aspirin and Plavix, HTN, HLD, and ventral who is being seen 10/26/2022 for the evaluation of NSTEMI at the request of Dr. Ina Homes.  History of Present Illness:   [name] is a 82 year old male with a past medical history above who we are consulted on for elevated troponins.  Patient has a past medical history of CAD status post CABG in 2004 who presents for concerns of abdominal hernia incarceration.  Patient required exploratory laparotomy with 80 cm bowel removal, and subsequently admitted to the ICU for vasopressor therapy as well as mechanical ventilation.  Patient has since been extubated, and now has had minimal pressor therapy, and off of pressors as well.  Patient had hospital course complicated with atrial fibrillation with RVR with rates going into the 140s and required amiodarone.  Patient also had troponinemia.  New echo showing left ventricular ejection fraction of 20 to 25%.  When I come speak the patient, patient is resting bed.  He has no concerns at this time.  Patient's daughter at bedside is giving most history.  Patient's daughter states that patient has been more weak, and not able to mobilize the way he used to prior to all of this.  This has been happening for the past few months.  She denies any chest pain or shortness of breath for the patient.  He has started using a wheelchair more than he has been using his cane.  Patient does follow with Dr. Domenic Polite regularly, and did miss an appointment.  Patient denies any lower extremity edema.  He denies any exertional chest pain.  He denies any episodes of passing out, but does report  some dizziness.  Of note, during hospitalization, patient did go into atrial fibrillation with RVR, and has converted to normal sinus rhythm after initiation of amiodarone.   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    Essential hypertension    Hematuria 04/30/2019   New complaint, does nott have pain of kidney stones, needs to submit UA for testing asap and needs urology eval   Hyperlipidemia    IGT (impaired glucose tolerance)    Obesity    OSA (obstructive sleep apnea)    Seizures ([place])    Age 83 or 3, no meds and no reoccurances   Skin lesion 12/07/2012   Skin tag 11/16/2011   Stroke [facility])    Umbilical hernia     Past Surgical History:  Procedure Laterality Date   COLONOSCOPY N/A 07/15/2015   Procedure: COLONOSCOPY;  Surgeon: Aviva Signs, MD;  Location: [facility] ENDO SUITE;  Service: Gastroenterology;  Laterality: N/A;   CORONARY ARTERY BYPASS GRAFT  09/13/2002   x4   HERNIA REPAIR     INCISIONAL HERNIA REPAIR N/A 01/01/2013   Procedure: HERNIA REPAIR INCISIONAL;  Surgeon: Jamesetta So, MD;  Location: [facility] ORS;  Service: General;  Laterality: N/A;  umbilical area   INSERTION OF MESH N/A 01/01/2013   Procedure: INSERTION OF MESH;  Surgeon: Jamesetta So, MD;  Location: [facility] ORS;  Service: General;  Laterality: N/A;   Left inguinal  hernia repair  09/14/2003   [name]  09/13/2005     Home Medications:  Prior to Admission medications   Medication Sig Start Date End Date Taking? Authorizing Provider  acetaminophen (TYLENOL) 650 MG CR tablet Take 1,900 mg by mouth in the morning and at bedtime.   Yes [provider]  aspirin 81 MG EC tablet Take 81 mg by mouth every evening.   Yes [provider]  Cholecalciferol (VITAMIN D) 2000 units CAPS Take 1  capsule by mouth 2 (two) times daily.   Yes [provider]  clopidogrel (PLAVIX) 75 MG tablet TAKE 1 TABLET BY MOUTH EVERY DAY Patient taking differently: Take 75 mg by mouth daily. 09/09/22  Yes Fayrene Helper, MD  Coenzyme Q10 (CO Q-10) 200 MG CAPS Take 1 tablet by mouth daily.    Yes [provider]  KLOR-CON M20 20 MEQ tablet TAKE 2 TABLETS BY MOUTH DAILY Patient taking differently: Take 40 mEq by mouth daily. 09/09/22  Yes Fayrene Helper, MD  lisinopril (ZESTRIL) 10 MG tablet TAKE 1 TABLET BY MOUTH EVERY DAY Patient taking differently: Take 10 mg by mouth daily. 08/19/22  Yes Fayrene Helper, MD  metoprolol tartrate (LOPRESSOR) 50 MG tablet TAKE HALF A TABLET BY MOUTH 2 (TWO) TIMES DAILY. Patient taking differently: Take 25 mg by mouth 2 (two) times daily. TAKE HALF A TABLET BY MOUTH 2 (TWO) TIMES DAILY. 09/09/22  Yes Fayrene Helper, MD  Multiple Vitamins-Minerals ([employer] SPECTRAVITE) TABS Take 1 tablet by mouth every evening.   Yes [provider]  polyethylene glycol powder (GLYCOLAX/MIRALAX) powder MIX TO TAKE 17 G DAILY AS DIRECTED Patient taking differently: Take 17 g by mouth daily as needed for mild constipation. 11/26/16  Yes Fayrene Helper, MD  simvastatin (ZOCOR) 20 MG tablet TAKE 1 TABLET BY MOUTH EVERYDAY AT BEDTIME Patient taking differently: Take 20 mg by mouth daily. 11/20/21  Yes Fayrene Helper, MD  triamterene-hydrochlorothiazide ([id number]) 37.5-25 MG tablet TAKE 1 TABLET BY MOUTH EVERY DAY IN THE MORNING Patient taking differently: Take 1 tablet by mouth daily. TAKE 1 TABLET BY MOUTH EVERY DAY IN THE MORNING 09/09/22  Yes Fayrene Helper, MD  oxyCODONE-acetaminophen (PERCOCET/ROXICET) 5-325 MG tablet Take one tablet by mouth two times daily, as needed, for severe pain 05/28/22   Fayrene Helper, MD  UNABLE TO FIND Commode assist chair 01/02/19   Fayrene Helper, MD  UNABLE TO FIND Bilateral new shoes  Left AFO  with calliber plate  Dx [id number]   Perlie Mayo, NP  UNABLE TO FIND Right custom torch walker x 1  Dx [id number] 02/04/20   Perlie Mayo, NP    Inpatient Medications: Scheduled Meds:  aspirin  150 mg Rectal Daily   bisacodyl  10 mg Rectal Daily   Chlorhexidine Gluconate Cloth  6 each Topical Daily   furosemide  40 mg Intravenous Q6H   insulin aspart  0-6 Units Subcutaneous Q4H   mouth rinse  15 mL Mouth Rinse Q2H   pantoprazole (PROTONIX) IV  40 mg Intravenous Q24H   Continuous Infusions:  sodium chloride     albumin human 25 g (10/26/22 1017)   amiodarone 30 mg/hr (10/26/22 1013)   dextrose 10 mL/hr at 10/26/22  0500   heparin 1,250 Units/hr (10/26/22 1046)   norepinephrine (LEVOPHED) Adult infusion 2 mcg/min (10/26/22 0500)   piperacillin-tazobactam (ZOSYN)  IV 3.375 g (10/26/22 0544)   potassium chloride 10 mEq (10/26/22 1318)   PRN Meds: Place/Maintain arterial line **AND** sodium chloride, ipratropium-albuterol, lip balm, [DISCONTINUED] ondansetron **OR** ondansetron (ZOFRAN) IV, mouth rinse  Allergies:    Allergies  Allergen Reactions   Bee Venom Swelling   Niacin Other (See Comments)    Muscle aches    Social History:   Social History   Socioeconomic History   Marital status: Married    Spouse name: Jolayne Haines   Number of children: 2   Years of education: Not on file   Highest education level: 12th grade  Occupational History   Occupation: retired   Tobacco Use   Smoking status: Former    Packs/day: 0.50    Years: 5.00    Total pack years: 2.50    Types: Cigarettes    Quit date: 12/27/1980    Years since quitting: 41.8   Smokeless tobacco: Former    Types: Chew    Quit date: 01/24/2013  Vaping Use   Vaping Use: Never used  Substance and Sexual Activity   Alcohol use: No    Alcohol/week: 0.0 standard drinks of alcohol   Drug use: No   Sexual activity: Not Currently    Birth control/protection: None  Other Topics Concern   Not on file   Social History Narrative   Not on file   Social Determinants of Health   Financial Resource Strain: Low Risk  (06/28/2022)   Overall Financial Resource Strain (CARDIA)    Difficulty of Paying Living Expenses: Not hard at all  Food Insecurity: No Food Insecurity (10/23/2022)   Hunger Vital Sign    Worried About Running Out of Food in the Last Year: Never true    [place] in the Last Year: Never true  Transportation Needs: No Transportation Needs (10/23/2022)   PRAPARE - Hydrologist (Medical): No    Lack of Transportation (Non-Medical): No  Physical Activity: Inactive (06/28/2022)   Exercise Vital Sign    Days of Exercise per Week: 0 days    Minutes of Exercise per Session: 0 min  Stress: No Stress Concern Present (06/28/2022)   [place]    Feeling of Stress : Not at all  Social Connections: Moderately Integrated (06/28/2022)   Social Connection and Isolation Panel [NHANES]    Frequency of Communication with Friends and Family: More than three times a week    Frequency of Social Gatherings with Friends and Family: Three times a week    Attends Religious Services: More than 4 times per year    Active Member of Clubs or Organizations: No    Attends Archivist Meetings: Never    Marital Status: Married  Human resources officer Violence: Not At Risk (10/23/2022)   Humiliation, Afraid, Rape, and Kick questionnaire    Fear of Current or Ex-Partner: No    Emotionally Abused: No    Physically Abused: No    Sexually Abused: No    Family History:   Father: Congestive heart failure Brother: MI in his 45s Brother: MI in his 50s  Family History  Problem Relation Age of Onset   Pancreatic cancer Mother    Heart disease Father    Prostate cancer Father    Stroke Brother    Heart disease  Brother    Heart disease Brother    COPD Sister    Actinic keratosis Sister    Obesity Son     Anxiety disorder Son      ROS:  Please see the history of present illness.   All other ROS reviewed and negative.     Physical Exam/Data:   Vitals:   10/26/22 0900 10/26/22 1000 10/26/22 1100 10/26/22 1143  BP:  (!) 120/56    Pulse: 72 76 72   Resp:   (!) 24   Temp:    97.7 F (36.5 C)  TempSrc:    Oral  SpO2: 97% 100% 100%   Weight:      Height:        Intake/Output Summary (Last 24 hours) at 10/26/2022 1319 Last data filed at 10/26/2022 1000 Gross per 24 hour  Intake 1613.4 ml  Output 1750 ml  Net -136.6 ml      10/22/2022   10:29 PM 10/22/2022   11:37 AM 07/02/2022    7:06 PM  Last 3 Weights  Weight (lbs) 195 lb 12.3 oz 187 lb 223 lb  Weight (kg) 88.8 kg 84.823 kg 101.152 kg     Body mass index is 28.09 kg/m.  General: Resting in bed, no acute distress HEENT: normal Neck: no JVD Vascular: No carotid bruits; Distal pulses 2+ bilaterally Cardiac: Grade 2/6 systolic murmur noted at right sternal border. Lungs: Clear anterior lung sounds Abd: soft, nontender, no hepatomegaly  Ext: 1+ pitting edema to bilateral lower extremities Skin: warm and dry  Psych:  Normal affect   EKG:  The EKG was personally reviewed and demonstrates: Normal sinus rhythm Telemetry:  Telemetry was personally reviewed and demonstrates: Normal sinus rhythm, with conversion to normal sinus rhythm at 1700 yesterday.  Relevant CV Studies: 10/24/2022 Echocardiogram: Showing new reduced ejection fraction of 20 to 25%.  There is moderate aortic stenosis as well.  Laboratory Data:  High Sensitivity Troponin:   Recent Labs  Lab 10/23/22 2232 10/24/22 0206 10/24/22 0536 10/24/22 2331 10/25/22 0154  TROPONINIHS 1,466* 1,659* 1,597* 972* 885*     Chemistry Recent Labs  Lab 10/23/22 1828 10/24/22 0206 10/24/22 1700 10/25/22 1851 10/26/22 0545  NA  --  138 137 140 139  K  --  3.0* 3.5 2.7* 3.5  CL  --  109 109 105 104  CO2  --  19* 20* 23 23  GLUCOSE  --  156* 110* 134* 142*  BUN   --  24* 25* 25* 25*  CREATININE  --  0.90 0.79 0.98 0.99  CALCIUM  --  8.4* 8.5* 8.3* 8.2*  MG 1.5* 1.6*  --  2.0  --   GFRNONAA  --  >60 >60 >60 >60  ANIONGAP  --  10 8 12 12    $ Recent Labs  Lab 10/22/22 1617 10/23/22 0430 10/24/22 0206  PROT 7.7 5.6* 5.1*  ALBUMIN 4.2 2.9* 2.3*  AST 28 33 22  ALT 15 11 11  $ ALKPHOS 39 26* 32*  BILITOT 1.2 1.0 0.7   Lipids  Recent Labs  Lab 10/23/22 0430  TRIG 56    Hematology Recent Labs  Lab 10/23/22 0430 10/24/22 0206 10/25/22 0154  WBC 4.7 10.9* 9.4  RBC 3.56* 3.12* 2.99*  HGB 11.4* 9.9* 9.5*  HCT 35.2* 30.0* 29.0*  MCV 98.9 96.2 97.0  MCH 32.0 31.7 31.8  MCHC 32.4 33.0 32.8  RDW 13.7 13.9 13.8  PLT 196 166 141*   Thyroid No results for  input(s): "TSH", "FREET4" in the last 168 hours.  BNPNo results for input(s): "BNP", "PROBNP" in the last 168 hours.  DDimer No results for input(s): "DDIMER" in the last 168 hours.   Radiology/Studies:  ECHOCARDIOGRAM COMPLETE  Result Date: 10/25/2022    ECHOCARDIOGRAM REPORT   Patient Name:   SINCERE GAYE Toppins Date of Exam: 10/24/2022 Medical Rec #:  [id number]     Height:       70.0 in Accession #:    [id number]    Weight:       195.8 lb Date of Birth:  Nov 03, 1940     BSA:          2.068 m Patient Age:    54 years      BP:           112/49 mmHg Patient Gender: M             HR:           61 bpm. Exam Location:  Inpatient Procedure: 2D Echo, Cardiac Doppler, Color Doppler and Intracardiac            Opacification Agent Indications:     [facility]) [id number]  History:         Patient has prior history of Echocardiogram examinations, most                  recent 09/11/2021. CAD, Prior CABG, Stroke,                  Signs/Symptoms:Hypotension; Risk Factors:Dyslipidemia and                  Hypertension.  Sonographer:     Greer Pickerel Referring Phys:  [place] Diagnosing Phys: Cherlynn Kaiser MD  Sonographer Comments: Image acquisition challenging due to patient body habitus and Image  acquisition challenging due to respiratory motion. IMPRESSIONS  1. Left ventricular ejection fraction, by estimation, is 20 to 25%. The left ventricle has severely decreased function. The left ventricle demonstrates regional wall motion abnormalities (see scoring diagram/findings for description). Left ventricular diastolic parameters are consistent with Grade II diastolic dysfunction (pseudonormalization).  2. Right ventricular systolic function is mildly reduced. The right ventricular size is normal. There is mildly elevated pulmonary artery systolic pressure. The estimated right ventricular systolic pressure is 123XX123 mmHg.  3. Left atrial size was severely dilated.  4. Right atrial size was mildly dilated.  5. The mitral valve is degenerative. Mild mitral valve regurgitation. No evidence of mitral stenosis.  6. The aortic valve is abnormal. There is severe calcifcation of the aortic valve. Aortic valve regurgitation is moderate. Moderate aortic valve stenosis. Aortic valve area, by VTI measures 0.63 cm. Aortic valve mean gradient measures 27.0 mmHg. Aortic  valve Vmax measures 3.40 m/s. DI 0.20, SVI 24 ml/m2, possible low flow low gradient AS.  7. The inferior vena cava is dilated in size with <50% respiratory variability, suggesting right atrial pressure of 15 mmHg. FINDINGS  Left Ventricle: Left ventricular ejection fraction, by estimation, is 20 to 25%. The left ventricle has severely decreased function. The left ventricle demonstrates regional wall motion abnormalities. Definity contrast agent was given IV to delineate the left ventricular endocardial borders. The left ventricular internal cavity size was normal in size. There is no left ventricular hypertrophy. Left ventricular diastolic parameters are consistent with Grade II diastolic dysfunction (pseudonormalization).  LV Wall Scoring: The anterior septum, mid inferoseptal segment, and basal inferoseptal segment are akinetic. The entire anterior  wall,  entire lateral wall, entire inferior wall, and apical septal segment are hypokinetic. Right Ventricle: The right ventricular size is normal. No increase in right ventricular wall thickness. Right ventricular systolic function is mildly reduced. There is mildly elevated pulmonary artery systolic pressure. The tricuspid regurgitant velocity  is 2.45 m/s, and with an assumed right atrial pressure of 15 mmHg, the estimated right ventricular systolic pressure is 123XX123 mmHg. Left Atrium: Left atrial size was severely dilated. Right Atrium: Right atrial size was mildly dilated. Pericardium: Trivial pericardial effusion is present. Mitral Valve: The mitral valve is degenerative in appearance. Mild to moderate mitral annular calcification. Mild mitral valve regurgitation. No evidence of mitral valve stenosis. Tricuspid Valve: The tricuspid valve is normal in structure. Tricuspid valve regurgitation is mild . No evidence of tricuspid stenosis. Aortic Valve: The aortic valve is abnormal. There is severe calcifcation of the aortic valve. Aortic valve regurgitation is moderate. Aortic regurgitation PHT measures 357 msec. Moderate aortic stenosis is present. Aortic valve mean gradient measures 27.0 mmHg. Aortic valve peak gradient measures 46.1 mmHg. Aortic valve area, by VTI measures 0.63 cm. Pulmonic Valve: The pulmonic valve was normal in structure. Pulmonic valve regurgitation is mild. No evidence of pulmonic stenosis. Aorta: The aortic root is normal in size and structure. Ascending aorta measurements are within normal limits for age when indexed to body surface area. Venous: The inferior vena cava is dilated in size with less than 50% respiratory variability, suggesting right atrial pressure of 15 mmHg. IAS/Shunts: No atrial level shunt detected by color flow Doppler.  LEFT VENTRICLE PLAX 2D LVIDd:         6.30 cm   Diastology LVIDs:         5.90 cm   LV e' medial:    4.22 cm/s LV PW:         1.00 cm   LV E/e' medial:  34.8  LV IVS:        0.90 cm   LV e' lateral:   7.54 cm/s LVOT diam:     2.00 cm   LV E/e' lateral: 19.5 LV SV:         50 LV SV Index:   24 LVOT Area:     3.14 cm  RIGHT VENTRICLE RV S prime:     6.49 cm/s TAPSE (M-mode): 1.2 cm LEFT ATRIUM              Index        RIGHT ATRIUM           Index LA diam:        4.60 cm  2.22 cm/m   RA Area:     26.70 cm LA Vol (A2C):   90.3 ml  43.66 ml/m  RA Volume:   75.90 ml  36.69 ml/m LA Vol (A4C):   140.0 ml 67.68 ml/m LA Biplane Vol: 115.0 ml 55.60 ml/m  AORTIC VALVE                     PULMONIC VALVE AV Area (Vmax):    0.70 cm      PR End Diast Vel: 6.50 msec AV Area (Vmean):   0.65 cm AV Area (VTI):     0.63 cm AV Vmax:           339.50 cm/s AV Vmean:          240.000 cm/s AV VTI:            0.799 m  AV Peak Grad:      46.1 mmHg AV Mean Grad:      27.0 mmHg LVOT Vmax:         76.00 cm/s LVOT Vmean:        49.800 cm/s LVOT VTI:          0.159 m LVOT/AV VTI ratio: 0.20 AI PHT:            357 msec  AORTA Ao Root diam: 3.90 cm Ao Asc diam:  3.90 cm MITRAL VALVE                TRICUSPID VALVE MV Area (PHT): 4.06 cm     TV Peak grad:   34.1 mmHg MV Decel Time: 187 msec     TV Vmax:        2.92 m/s MV E velocity: 147.00 cm/s  TR Peak grad:   24.0 mmHg MV A velocity: 75.10 cm/s   TR Vmax:        245.00 cm/s MV E/A ratio:  1.96                             SHUNTS                             Systemic VTI:  0.16 m                             Systemic Diam: 2.00 cm Cherlynn Kaiser MD Electronically signed by Cherlynn Kaiser MD Signature Date/Time: 10/25/2022/10:22:02 AM    Final (Updated)    DG CHEST PORT 1 VIEW  Result Date: 10/24/2022 CLINICAL DATA:  Ventilator dependence. EXAM: PORTABLE CHEST 1 VIEW COMPARISON:  10/23/2022 FINDINGS: Endotracheal tube tip is 5.3 cm above the base of the carina. NG tube visualized although course through the lower chest is not CRNA April Manson due to technical factors. Right IJ central line tip overlies the mid SVC level. The cardio pericardial  silhouette is enlarged. Bibasilar airspace disease evident with some airspace opacity noted left mid lung. Telemetry leads overlie the chest. IMPRESSION: 1. Endotracheal tube tip is 5.3 cm above the base of the carina. 2. Bibasilar and left mid lung airspace disease. Airspace opacity in the left parahilar mid lung appears progressive in the interval. Electronically Signed   By: Misty Stanley M.D.   On: 10/24/2022 07:53   DG CHEST PORT 1 VIEW  Result Date: 10/23/2022 CLINICAL DATA:  Central line placement.  Endotracheal tube present. EXAM: PORTABLE CHEST 1 VIEW COMPARISON:  Prior today FINDINGS: New right jugular central venous catheter seen with tip overlying the superior cavoatrial junction. No evidence of pneumothorax. Endotracheal tube and gastric tube remain in place. Stable cardiomegaly. Stable interstitial infiltrates, consistent with interstitial edema. No evidence of pulmonary consolidation or pleural effusion. Prior CABG again noted. IMPRESSION: New right jugular central venous catheter in appropriate position. No evidence of pneumothorax. Stable cardiomegaly and mild interstitial edema. Electronically Signed   By: Marlaine Hind M.D.   On: 10/23/2022 11:15   DG CHEST PORT 1 VIEW  Result Date: 10/23/2022 CLINICAL DATA:  Intubated EXAM: PORTABLE CHEST 1 VIEW COMPARISON:  10/22/2022 FINDINGS: Endotracheal tube with the tip 5.6 cm above the carina. Nasogastric tube with the tip projecting over the stomach. Bilateral diffuse interstitial thickening. Trace bilateral pleural effusions. No pneumothorax. No focal consolidation. Stable cardiomegaly. Prior  CABG. No acute osseous abnormality. IMPRESSION: Mild CHF. Electronically Signed   By: Kathreen Devoid M.D.   On: 10/23/2022 09:50   [place] EKG SITE RITE  Result Date: 10/23/2022 If Site Rite image not attached, placement could not be confirmed due to current cardiac rhythm.  DG Chest Port 1 View  Result Date: 10/22/2022 CLINICAL DATA:  [id number].  Ventilator  dependent respiratory failure. EXAM: PORTABLE CHEST 1 VIEW COMPARISON:  CT abdomen and pelvis earlier today, portable chest and chest CT both 11/25/2016. FINDINGS: 11:23 p.m. Interval intubation. Tip of the ETT is 4.3 cm from the carina. NGT courses to the left in the stomach with the tip in the proximal fundus. 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 There is tortuosity and calcification of the aorta with stable mediastinum. Small loculated left pleural effusion with pleural calcifications better demonstrated on CT. There is mild cardiomegaly with normal caliber central vessel markings. There is interval worsening of the lung fields with left-greater-than-right perihilar opacities developed and small layering pleural effusions. Findings could be due to pneumonia or edema. There is increasing left lower lobe consolidation as well. There is thoracic spondylosis. IMPRESSION: 1. Interval worsening of the lung fields with left-greater-than-right perihilar opacities and increasing left lower lobe consolidation. Findings could be due to pneumonia or edema. Clinical correlation and continued radiographic follow-up recommended. 2. Small layering pleural effusions. 3. Mild cardiomegaly. 4. Aortic atherosclerosis. Electronically Signed   By: Telford Nab M.D.   On: 10/22/2022 23:50   CT ABDOMEN PELVIS W CONTRAST  Result Date: 10/22/2022 CLINICAL DATA:  History of colon cancer. EXAM: CT ABDOMEN AND PELVIS WITH CONTRAST TECHNIQUE: Multidetector CT imaging of the abdomen and pelvis was performed using the standard protocol following bolus administration of intravenous contrast. RADIATION DOSE REDUCTION: This exam was performed according to the departmental dose-optimization program which includes automated exposure control, adjustment of the mA and/or kV according to patient size and/or use of iterative reconstruction technique. CONTRAST:  188m OMNIPAQUE IOHEXOL 300 MG/ML  SOLN  COMPARISON:  05/11/2018 FINDINGS: Lower chest: Chronic left pleural effusion with pleural calcifications. Hepatobiliary: No focal liver abnormality is seen. No gallstones, gallbladder wall thickening, or biliary dilatation. Pancreas: Unremarkable. No pancreatic ductal dilatation or surrounding inflammatory changes. Spleen: Normal in size without focal abnormality. Adrenals/Urinary Tract: Adrenal glands are unremarkable. No urolithiasis or obstructive uropathy. 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 Stomach/Bowel: Numerous dilated loops of small bowel measuring up to 4.3 cm with multiple air-fluid levels. Ventral abdominal hernia containing numerous dilated loops of small bowel, surrounding soft tissue edema concerning for an incarcerated hernia resulting in small bowel obstruction. Distended stomach with an air-fluid level. Small hiatal hernia. Prior colectomy. Vascular/Lymphatic: Normal caliber abdominal aorta with mild atherosclerosis. No lymphadenopathy. Reproductive: Enlarged prostate gland. Other: Fluid containing right inguinal hernia. Fat containing left inguinal hernia. No abdominal ascites. Musculoskeletal: No acute osseous abnormality. No aggressive osseous lesion. IMPRESSION: 1. Ventral abdominal hernia containing numerous dilated loops of small bowel, surrounding soft tissue edema concerning for an incarcerated hernia resulting in small bowel obstruction. 2. 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 3. Chronic left pleural effusion with pleural calcifications. 4.  Aortic Atherosclerosis (ICD10-I70.0). Electronically Signed   By: HKathreen DevoidM.D.   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   Etiology can be multifactorial in the setting of aortic stenosis.  Given the acuity, less likely.  Most likely reason is tachycardia mediated heart failure given recent atrial fibrillation with RVR.  Home medication does include lisinopril 10 mg daily and metoprolol tartrate 25 mg twice daily.  Will likely need to start titrating GDMT medications.  Given decreased blood pressure requiring pressors, will slowly need to introduce lisinopril and metoprolol.  No urgent need for cath at this moment.  Will need to repeat echo in 6-8 weeks once patient is out of critical state.  NSTEMI, likely Type 2: Troponins have started to trend down.  This likely in the setting of above as well as new atrial fibrillation.  No acute need to go to Cath Lab.  Patient likely will need cath in the future, if patient does pursue aortic valve replacement.  For now can continue heparin, but given A-fib, will need to transition to Eliquis.  Continue aspirin.  Atrial fibrillation with RVR: Patient has now been converted back to sinus rhythm after amiodarone.  Patient is not in RVR at this moment as well.  Patient currently on IV heparin.  Plan to start Eliquis.  Patient has not taken Plavix, will no longer need Plavix as triple therapy will put patient at risk for bleeding.  Continue to monitor on telemetry.  Continue IV amiodarone, once patient can take by mouth, can transition to oral.  CAD s/p CABG 2004 and HLD: Can continue home statin and aspirin.  Will need to stop Plavix in the outpatient.  Continue IV heparin for now.  5.  Aortic stenosis: New echo showing moderate aortic stenosis.  Patient could benefit from a TAVR in the future.  Patient is currently too ill to go through procedure.  Patient will likely need catheterization prior to aortic stenosis repair, and at that time can evaluate for any ischemic causes as well.  Continue to monitor in the outpatient setting.  Cardiology will continue to follow while patient is  inpatient.  Risk Assessment/Risk Scores:  TIMI Risk Score for Unstable Angina or Non-ST Elevation MI:   [name] TIMI risk score is 5, which indicates a 26% risk of all cause mortality, new or recurrent myocardial infarction or need for urgent revascularization in the next 14 days.{ New York Heart Association (NYHA) Functional Class NYHA Class II  CHA2DS2-VASc Score = 7       For questions or updates, please contact [place] Please consult [email or web address] for contact info under    Signed, Leigh Aurora, DO  Internal Medicine Resident PGY-1  10/26/2022 1:19 PM  Patient seen, examined. Available data reviewed. Agree with findings, assessment, and plan as outlined by Dr Posey Pronto.  The patient is independently interviewed and examined.  His wife, daughter, and grandson are all at the bedside.  He is an elderly male in no distress.  He appears weak/physically frail.  HEENT is normal, the patient is edentulous.  Neck with normal JVP and no carotid bruits, lungs are clear bilaterally, heart is regular rate and rhythm with a 2/6 systolic ejection murmur at the right upper sternal border with distant heart sounds, abdomen is soft, extremities with diffuse 1+ edema, skin is warm and dry with no rash.  Telemetry shows that the patient converted from atrial fibrillation to normal sinus rhythm at 5 PM yesterday.  He is currently treated with IV heparin and IV amiodarone.  Significant issues are as follows: Type II non-STEMI likely secondary to demand ischemia: The patient was critically ill with an incarcerated hernia requiring emergency surgery, vasopressor use, etc.  His relatively modest troponin leak is in line with demand ischemia.  He has not had any anginal chest discomfort.  Recommend medical therapy. Acute heart failure with reduced ejection fraction: As outlined above, I suspect the less likely etiology is tachycardia mediated.  However, this could be in part due to ischemic heart  disease and aortic stenosis.  Will slowly add GDMT as tolerated.  Hopefully with maintenance of sinus rhythm on amiodarone, his LV function will improve.  I would anticipate a repeat echocardiogram in about 6 to 8 weeks. Atrial fibrillation with RVR: Converted to sinus rhythm on IV amiodarone.  Currently on IV heparin.  Will transition him to oral anticoagulation in the near future if okay with the surgical and CCM teams.  At that time, he could discontinue clopidogrel.  Will also plan to transition him to oral amiodarone. Aortic stenosis: He has had moderately severe aortic stenosis on recent echo studies.  Now there is a question of low-flow low gradient aortic stenosis.  We discussed the natural history of aortic stenosis today as well as potential treatment options.  He might be a candidate for TAVR down the road once he recovers from his current illness.  Again, we will repeat an echocardiogram in 6 to 8 weeks.  If we pursue TAVR in the future he will require cardiac catheterization and CT angiography studies as part of his evaluation.  He is not ready for any of this at the present time and we will follow him back up as an outpatient.  Our team will follow as the patient progresses to help with medical therapy of the above issues.  Please let us know if any questions arise.  Sherren Mocha, M.D. 10/26/2022 1:32 PM

## 2022-10-26 NOTE — Progress Notes (Signed)
NAME:  Craig Fields, MRN:  [id number], DOB:  [birth year], LOS: 4 ADMISSION DATE:  10/22/2022, CONSULTATION DATE:  2/10 REFERRING MD:  Manuella Ghazi, CHIEF COMPLAINT: Respiratory failure and septic shock  History of Present Illness:  82 year old male patient with history as mentioned below most significantly known Ventral hernia.  Admitted on 2/9 with abdominal pain, nausea and vomiting CT abdomen showed ventral hernia with small bowel loops and surrounding soft tissue edema concerning for incarcerated hernia and resultant small bowel obstruction, he is brought emergently to the operating room where he underwent exploratory laparotomy, ventral hernia repair, and bowel resection.  Postoperative course complicated by ongoing hypotension, requiring norepinephrine in spite of volume resuscitation, ongoing ventilator dependence.  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 Went for exploration laparotomy with small bowel resection and ventral hernia repair on 2/9 for strangulated ventral hernia resulting in necrotic small bowel. 80 cm of small bowel resected  2/10 transferred to [facility] for on-going NE needs and need for Mechanical ventilation  2/11 trops mildly elevated, pressor stable   Interim History / Subjective:  Pressures remain borderline.  Objective   Blood pressure (!) 115/47, pulse 77, temperature 98.4 F (36.9 C), temperature source Oral, resp. rate (!) 22, height 5' 10"$  (1.778 m), weight 88.8 kg, SpO2 97 %. CVP:  [5 mmHg-6 mmHg] 6 mmHg      Intake/Output Summary (Last 24 hours) at 10/26/2022 0850 Last data filed at 10/26/2022 0600 Gross per 24 hour  Intake 1858.27 ml   Output 3025 ml  Net -1166.73 ml    Filed Weights   10/22/22 1137 10/22/22 2229  Weight: 84.8 kg 88.8 kg    Examination: No distress Stable redness on cheeks Lungs diminished bases Abd soft, dressing in place without strikethrough Moves ext to command, weaker on L, cannot move L arm Profoundly weak  Some afib/RVR yesterday resolved  Resolved Hospital Problem list   Lactic acidosis resolved   Assessment & Plan:  S/P exploratory lap 2/9 for strangulated ventral hernia resulting in necrotic small bowel. 80 cm of small bowel resected  Postoperative shock state- some combination of fluid shifts, distributive/septic, and cardiogenic Postoperative ileus- improved Perioperative aspiration event- off vent 2/11, improving Afib/RVR, NSTEMI- will have cardiology see Volume overloaded state of heart- stable, will work on this Hx CVA with left sided weakness Hx OSA, HLD, HTN L cheek redness- keep eye on clinically  - PT/OT, progressive mobility - Albumin/lasix - Amio/heparin, rectal aspirin - cardiology consult for GDMT and consideration for cath prior to discharge - Levo for MAP 65, off at present but  - Remove a line - Rectal aspirin - postoperative incision care, PO activity per CCS, may need TPN, keep CVL for now - If cleared for liquids/meds, will add midodrine - Zosyn until 10/29/22  Best Practice (right click and "Reselect all SmartList Selections" daily)   Diet/type: NPO DVT prophylaxis: prophylactic heparin  GI prophylaxis: PPI Lines: Central line and yes and it is still needed Foley:  Yes, and it is still needed Code Status:  full code Last date of multidisciplinary goals of care discussion [Family at bedside]  32 min cc time  Erskine Emery MD PCCM

## 2022-10-26 NOTE — Progress Notes (Signed)
ANTICOAGULATION CONSULT NOTE - Initial Consult  Pharmacy Consult for heparin gtt Indication: atrial fibrillation  Allergies  Allergen Reactions   Bee Venom Swelling   Niacin Other (See Comments)    Muscle aches    Patient Measurements: Height: 5' 10"$  (177.8 cm) Weight: 88.8 kg (195 lb 12.3 oz) IBW/kg (Calculated) : 73 Heparin Dosing Weight: 88.8 kg   Vital Signs: Temp: 98.4 F (36.9 C) (02/13 0738) Temp Source: Oral (02/13 0738) BP: 115/47 (02/13 0805) Pulse Rate: 77 (02/13 0805)  Labs: Recent Labs    10/24/22 0206 10/24/22 0536 10/24/22 1700 10/24/22 2331 10/25/22 0154 10/25/22 1851 10/26/22 0545  HGB 9.9*  --   --   --  9.5*  --   --   HCT 30.0*  --   --   --  29.0*  --   --   PLT 166  --   --   --  141*  --   --   LABPROT 16.9*  --   --   --   --   --   --   INR 1.4*  --   --   --   --   --   --   CREATININE 0.90  --  0.79  --   --  0.98 0.99  TROPONINIHS 1,659* 1,597*  --  972* 885*  --   --     Estimated Creatinine Clearance: 63.4 mL/min (by C-G formula based on SCr of 0.99 mg/dL).   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 Sepsis now resolving. However, afib continues. Pt on amiodarone infusion. Pharmacy consulted to dose heparin infusion for afib -   CBC  Hgb 9.5, Plt 141   Goal of Therapy:  Heparin level 0.3-0.7 units/ml Monitor platelets by anticoagulation protocol: Yes   Plan:  D/c heparin SQ for DVT proph  Heparin bolus 4400 units x1 followed by  Heparin infusion at 1200u/hr 8hr HL at 1800 Daily HL, CBC F/u long term anticoag plans   Wilson Singer, PharmD Clinical Pharmacist 10/26/2022 9:04 AM

## 2022-10-26 NOTE — Progress Notes (Signed)
ANTICOAGULATION CONSULT NOTE  Pharmacy Consult for heparin gtt Indication: atrial fibrillation  Allergies  Allergen Reactions   Bee Venom Swelling   Niacin Other (See Comments)    Muscle aches    Patient Measurements: Height: 5' 10"$  (177.8 cm) Weight: 88.8 kg (195 lb 12.3 oz) IBW/kg (Calculated) : 73 Heparin Dosing Weight: 88.8 kg   Vital Signs: Temp: 98.1 F (36.7 C) (02/13 1539) Temp Source: Oral (02/13 1539) BP: 140/67 (02/13 1700) Pulse Rate: 70 (02/13 1500)  Labs: Recent Labs    10/24/22 0206 10/24/22 0536 10/24/22 1700 10/24/22 2331 10/25/22 0154 10/25/22 1851 10/26/22 0545 10/26/22 1821  HGB 9.9*  --   --   --  9.5*  --   --   --   HCT 30.0*  --   --   --  29.0*  --   --   --   PLT 166  --   --   --  141*  --   --   --   LABPROT 16.9*  --   --   --   --   --   --   --   INR 1.4*  --   --   --   --   --   --   --   HEPARINUNFRC  --   --   --   --   --   --   --  0.36  CREATININE 0.90  --  0.79  --   --  0.98 0.99  --   TROPONINIHS 1,659* 1,597*  --  972* 885*  --   --   --      Estimated Creatinine Clearance: 63.4 mL/min (by C-G formula based on SCr of 0.99 mg/dL).   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 Sepsis now resolving. However, afib continues.  Pt on amiodarone infusion. Pharmacy consulted to dose heparin infusion for afib -   Initial heparin level therapeutic   Goal of Therapy:  Heparin level 0.3-0.7 units/ml Monitor platelets by anticoagulation protocol: Yes   Plan:  Heparin to 1300 units / hr Heparin level, CBC in AM F/u long term anticoag plans  Thank you Anette Guarneri, PharmD 10/26/2022 7:15 PM

## 2022-10-26 NOTE — Evaluation (Signed)
Physical Therapy Evaluation Patient Details Name: Craig Fields MRN: [id number] DOB: 08-01-1941 Today's Date: 10/26/2022  History of Present Illness  82 yo admitted 2/9 with incarcerated hernia causing SBO s/p ex lap same date at [facility]. Transferred to [facility] 2/10 with septic shock and Afib. Extubated 2/11. PMhx: HLD, obesity, HTN, CVA with hemiplegia, colon CA, sleep apnea  Clinical Impression  Pt pleasant and lives with wife with assist of family. Pt normally can get OOB and walk limited distance with quad cane with assist to don/doff bil LE orthotics. Pt with total +2 assist to transition to sitting this session with poor balance sitting and needing +2 assist for all mobility. Pt with decreased strength, balance, cognition, function and safety who will benefit from acute therapy to maximize mobility, safety and independence to decrease burden of care.   HR 74 SPO2 100% on RA       Recommendations for follow up therapy are one component of a multi-disciplinary discharge planning process, led by the attending physician.  Recommendations may be updated based on patient status, additional functional criteria and insurance authorization.  Follow Up Recommendations Skilled nursing-short term rehab (<3 hours/day) Can patient physically be transported by private vehicle: No    Assistance Recommended at Discharge Frequent or constant Supervision/Assistance  Patient can return home with the following  Two people to help with walking and/or transfers;Two people to help with bathing/dressing/bathroom;Assistance with cooking/housework;Assistance with feeding;Direct supervision/assist for medications management;Assist for transportation    Equipment Recommendations Other (comment);Hospital bed (hoyer)  Recommendations for Other Services       Functional Status Assessment Patient has had a recent decline in their functional status and/or demonstrates limited ability to make significant improvements  in function in a reasonable and predictable amount of time     Precautions / Restrictions Precautions Precautions: Fall;Other (comment) Precaution Comments: art line, CVP, left hand contracted Required Braces or Orthoses: Other Brace Other Brace: bil built up shoes, boot on left      Mobility  Bed Mobility Overal bed mobility: Needs Assistance Bed Mobility: Supine to Sit, Sit to Supine     Supine to sit: Total assist, +2 for physical assistance Sit to supine: Total assist, +2 for physical assistance   General bed mobility comments: pt with total +2 to roll, pivot to EOB with HOB 35 degrees and return to bed. Max assist for sitting balance due to posterior right lean    Transfers Overall transfer level: Needs assistance   Transfers: Sit to/from Stand Sit to Stand: From elevated surface, Max assist, +2 physical assistance           General transfer comment: max +2 with bil knees blocked and use of pad to rise from surface and shift hips toward EOB    Ambulation/Gait                  Stairs            Wheelchair Mobility    Modified Rankin (Stroke Patients Only)       Balance Overall balance assessment: Needs assistance   Sitting balance-Leahy Scale: Zero Sitting balance - Comments: max assist for sitting balance Postural control: Posterior lean, Right lateral lean                                   Pertinent Vitals/Pain Pain Assessment Pain Assessment: 0-10 Pain Score: 4  Pain Location: LLE with touch  and movement- reports soreness since fall at [place] Pain Descriptors / Indicators: Aching, Grimacing Pain Intervention(s): Limited activity within patient's tolerance, Repositioned, Monitored during session    [place] expects to be discharged to:: Private residence Living Arrangements: Spouse/significant other Available Help at Discharge: Family;Available 24 hours/day Type of Home: House Home Access:  Ramped entrance       Home Layout: One level Home Equipment: [place] - quad;Shower seat;BSC/3in1;Wheelchair - manual      Prior Function Prior Level of Function : Needs assist       Physical Assist : ADLs (physical);Mobility (physical)     Mobility Comments: pt reports he could get out of regular bed and stand without assist, walks about 15' at a time with quad cane. WC for community ADLs Comments: wife or family assist with bathing/dressing and perform all IADLS     Hand Dominance        Extremity/Trunk Assessment   Upper Extremity Assessment Upper Extremity Assessment: Defer to OT evaluation    Lower Extremity Assessment Lower Extremity Assessment: RLE deficits/detail;LLE deficits/detail RLE Deficits / Details: grossly 2/5 LLE Deficits / Details: no significant AROM noted with total assist to don/doff boot    Cervical / Trunk Assessment Cervical / Trunk Assessment: Kyphotic;Other exceptions Cervical / Trunk Exceptions: posterior right lean in sitting  Communication   Communication: HOH  Cognition Arousal/Alertness: Awake/alert Behavior During Therapy: Flat affect Overall Cognitive Status: Impaired/Different from baseline Area of Impairment: Memory, Following commands, Awareness, Problem solving, Orientation                 Orientation Level: Time   Memory: Decreased short-term memory Following Commands: Follows one step commands inconsistently, Follows one step commands with increased time     Problem Solving: Slow processing, Requires verbal cues, Requires tactile cues, Difficulty sequencing, Decreased initiation          General Comments      Exercises     Assessment/Plan    PT Assessment Patient needs continued PT services  PT Problem List Decreased strength;Decreased mobility;Decreased safety awareness;Decreased activity tolerance;Decreased cognition;Decreased balance;Decreased knowledge of use of DME       PT Treatment Interventions DME  instruction;Therapeutic activities;Gait training;Therapeutic exercise;Patient/family education;Cognitive remediation;Balance training;Functional mobility training    PT Goals (Current goals can be found in the Care Plan section)  Acute Rehab PT Goals Patient Stated Goal: return home PT Goal Formulation: With patient/family Time For Goal Achievement: 11/09/22 Potential to Achieve Goals: Fair    Frequency Min 2X/week     Co-evaluation               AM-PAC PT "6 Clicks" Mobility  Outcome Measure Help needed turning from your back to your side while in a flat bed without using bedrails?: Total Help needed moving from lying on your back to sitting on the side of a flat bed without using bedrails?: Total Help needed moving to and from a bed to a chair (including a wheelchair)?: Total Help needed standing up from a chair using your arms (e.g., wheelchair or bedside chair)?: Total Help needed to walk in hospital room?: Total Help needed climbing 3-5 steps with a railing? : Total 6 Click Score: 6    End of Session Equipment Utilized During Treatment: Other (comment) (bil foot orthotics) Activity Tolerance: Patient tolerated treatment well Patient left: in bed;with call bell/phone within reach;with nursing/sitter in room;with family/visitor present Nurse Communication: Mobility status;Need for lift equipment PT Visit Diagnosis: Other abnormalities of gait and mobility (R26.89);Muscle  weakness (generalized) (M62.81);Difficulty in walking, not elsewhere classified (R26.2)    Time: 1010-1038 PT Time Calculation (min) (ACUTE ONLY): 28 min   Charges:   PT Evaluation $PT Eval Moderate Complexity: 1 Mod          [place], PT Acute Rehabilitation Services Office: [phone]   Lamarr Lulas 10/26/2022, 10:48 AM

## 2022-10-27 DIAGNOSIS — K460 Unspecified abdominal hernia with obstruction, without gangrene: Secondary | ICD-10-CM | POA: Diagnosis not present

## 2022-10-27 DIAGNOSIS — I4891 Unspecified atrial fibrillation: Secondary | ICD-10-CM | POA: Diagnosis not present

## 2022-10-27 LAB — CBC
HCT: 26.2 % — ABNORMAL LOW (ref 39.0–52.0)
HCT: 26.6 % — ABNORMAL LOW (ref 39.0–52.0)
Hemoglobin: 8.5 g/dL — ABNORMAL LOW (ref 13.0–17.0)
Hemoglobin: 8.7 g/dL — ABNORMAL LOW (ref 13.0–17.0)
MCH: 31 pg (ref 26.0–34.0)
MCH: 31.5 pg (ref 26.0–34.0)
MCHC: 32.4 g/dL (ref 30.0–36.0)
MCHC: 32.7 g/dL (ref 30.0–36.0)
MCV: 95.6 fL (ref 80.0–100.0)
MCV: 96.4 fL (ref 80.0–100.0)
Platelets: 149 10*3/uL — ABNORMAL LOW (ref 150–400)
Platelets: 181 10*3/uL (ref 150–400)
RBC: 2.74 MIL/uL — ABNORMAL LOW (ref 4.22–5.81)
RBC: 2.76 MIL/uL — ABNORMAL LOW (ref 4.22–5.81)
RDW: 14 % (ref 11.5–15.5)
RDW: 14 % (ref 11.5–15.5)
WBC: 7.1 10*3/uL (ref 4.0–10.5)
WBC: 10.4 10*3/uL (ref 4.0–10.5)
nRBC: 0 % (ref 0.0–0.2)
nRBC: 0 % (ref 0.0–0.2)

## 2022-10-27 LAB — BASIC METABOLIC PANEL
Anion gap: 13 (ref 5–15)
Anion gap: 10 (ref 5–15)
BUN: 26 mg/dL — ABNORMAL HIGH (ref 8–23)
BUN: 26 mg/dL — ABNORMAL HIGH (ref 8–23)
CO2: 27 mmol/L (ref 22–32)
CO2: 26 mmol/L (ref 22–32)
Calcium: 8.5 mg/dL — ABNORMAL LOW (ref 8.9–10.3)
Calcium: 8.6 mg/dL — ABNORMAL LOW (ref 8.9–10.3)
Chloride: 97 mmol/L — ABNORMAL LOW (ref 98–111)
Chloride: 102 mmol/L (ref 98–111)
Creatinine, Ser: 0.95 mg/dL (ref 0.61–1.24)
Creatinine, Ser: 0.95 mg/dL (ref 0.61–1.24)
GFR, Estimated: >60 mL/min (ref >60)
GFR, Estimated: >60 mL/min (ref >60)
Glucose, Bld: 105 mg/dL — ABNORMAL HIGH (ref 70–99)
Glucose, Bld: 133 mg/dL — ABNORMAL HIGH (ref 70–99)
Potassium: 2.7 mmol/L — CL (ref 3.5–5.1)
Potassium: 3.5 mmol/L (ref 3.5–5.1)
Sodium: 137 mmol/L (ref 135–145)
Sodium: 138 mmol/L (ref 135–145)

## 2022-10-27 LAB — GLUCOSE, CAPILLARY
Glucose-Capillary: 98 mg/dL (ref 70–99)
Glucose-Capillary: 102 mg/dL — ABNORMAL HIGH (ref 70–99)
Glucose-Capillary: 95 mg/dL (ref 70–99)
Glucose-Capillary: 117 mg/dL — ABNORMAL HIGH (ref 70–99)
Glucose-Capillary: 140 mg/dL — ABNORMAL HIGH (ref 70–99)

## 2022-10-27 LAB — TYPE AND SCREEN
ABO/RH(D): A POS
Antibody Screen: NEGATIVE

## 2022-10-27 LAB — HEPARIN LEVEL (UNFRACTIONATED): Heparin Unfractionated: 0.56 IU/mL (ref 0.30–0.70)

## 2022-10-27 LAB — MAGNESIUM: Magnesium: 2 mg/dL (ref 1.7–2.4)

## 2022-10-27 LAB — PHOSPHORUS: Phosphorus: 2.5 mg/dL (ref 2.5–4.6)

## 2022-10-27 MED ORDER — ENSURE ENLIVE PO LIQD
237.0000 mL | Freq: Three times a day (TID) | ORAL | Status: DC
  Administered 2022-10-27 – 2022-11-01 (×11): 237 mL via ORAL

## 2022-10-27 MED ORDER — LISINOPRIL 5 MG PO TABS
5.0000 mg | ORAL_TABLET | Freq: Every day | ORAL | Status: DC
  Administered 2022-10-27 – 2022-10-28 (×2): 5 mg via ORAL
  Filled 2022-10-27 (×2): qty 1

## 2022-10-27 MED ORDER — PROSOURCE PLUS PO LIQD
30.0000 mL | Freq: Two times a day (BID) | ORAL | Status: DC
  Administered 2022-10-28 – 2022-11-01 (×9): 30 mL via ORAL
  Filled 2022-10-27 (×9): qty 30

## 2022-10-27 MED ORDER — SODIUM CHLORIDE 0.9 % IV SOLN: INTRAVENOUS | Status: DC | PRN

## 2022-10-27 MED ORDER — ASPIRIN 81 MG PO TBEC
81.0000 mg | DELAYED_RELEASE_TABLET | Freq: Every day | ORAL | Status: DC
  Administered 2022-10-27 – 2022-11-01 (×6): 81 mg via ORAL
  Filled 2022-10-27 (×6): qty 1

## 2022-10-27 MED ORDER — POTASSIUM CHLORIDE 10 MEQ/50ML IV SOLN
10.0000 meq | INTRAVENOUS | Status: AC
  Administered 2022-10-27 (×4): 10 meq via INTRAVENOUS
  Filled 2022-10-27 (×4): qty 50

## 2022-10-27 MED ORDER — POTASSIUM CHLORIDE 10 MEQ/50ML IV SOLN
10.0000 meq | INTRAVENOUS | Status: AC
  Administered 2022-10-27 (×2): 10 meq via INTRAVENOUS
  Filled 2022-10-27 (×2): qty 50

## 2022-10-27 MED ORDER — MIDODRINE HCL 5 MG PO TABS: 5.0000 mg | ORAL_TABLET | Freq: Three times a day (TID) | ORAL | Status: DC

## 2022-10-27 MED ORDER — CALCIUM CARBONATE ANTACID 500 MG PO CHEW
1.0000 | CHEWABLE_TABLET | Freq: Three times a day (TID) | ORAL | Status: DC | PRN
  Administered 2022-10-27: 200 mg via ORAL
  Filled 2022-10-27: qty 1

## 2022-10-27 MED ORDER — POTASSIUM CHLORIDE CRYS ER 20 MEQ PO TBCR
40.0000 meq | EXTENDED_RELEASE_TABLET | Freq: Once | ORAL | Status: AC
  Administered 2022-10-27: 40 meq via ORAL
  Filled 2022-10-27: qty 2

## 2022-10-27 MED ORDER — AMIODARONE HCL 200 MG PO TABS
400.0000 mg | ORAL_TABLET | Freq: Two times a day (BID) | ORAL | Status: DC
  Administered 2022-10-27 – 2022-11-01 (×11): 400 mg via ORAL
  Filled 2022-10-27 (×11): qty 2

## 2022-10-27 NOTE — Progress Notes (Signed)
Initial Nutrition Assessment  DOCUMENTATION CODES:   Not applicable, suspect malnutrition  INTERVENTION:   - Continue Ensure Enlive po TID, each supplement provides 350 kcal and 20 grams of protein  - Trial Magic Cup TID with meals, each supplement provides 290 kcal and 9 grams of protein  - Trial PROSource Plus 30 ml po BID, each supplement provides 100 kcal and 15 grams of protein  - Encourage PO intake  - Recommend REGULAR diet (no diet restrictions) once pt able to have solid foods  NUTRITION DIAGNOSIS:   Increased nutrient needs related to chronic illness, post-op healing as evidenced by estimated needs.  GOAL:   Patient will meet greater than or equal to 90% of their needs  MONITOR:   PO intake, Supplement acceptance, Diet advancement, Labs, Weight trends, Skin, I & O's  REASON FOR ASSESSMENT:   Consult Assessment of nutrition requirement/status  ASSESSMENT:   82 year old male who presented to the ED on 2/09 with abdominal pain. PMH of incarcerated ventral hernia, umbilical hernia repair, L inguinal hernia repair, CAD s/p CABG, CVA, HTN, HLD, colon cancer s/p colon resection. Pt admitted with ventral hernia causing SBO with necrotic small bowel.  02/09 - NG tube placed, s/p ex lap, ventral hernia repair, bowel resection, partial omentectomy 02/10 - transfer to [facility] 02/11 - extubated 02/13 - NG tube removed, clear liquid diet 02/14 - full liquid diet  RD working remotely. Per notes, pt has new HFrEF. Cardiology has been consulted.  Attempted to speak with both pt's wife at mobile number in chart (wrong number) and pt's daughter at mobile number in chart (no answer). Per notes, pt with poor PO intake and decreased appetite PTA. Per PCCM note, malnutrition is present and will be a big barrier for pt going forward. Unable to obtain more details about diet history at this time.  Reviewed weight history in chart. Pt with a 10.2 kg weight loss since 07/02/22. This is a  9.8% weight loss in 4 months which is severe and significant for timeframe. Noted pt with mild pitting generalized edema and moderate pitting edema to LLE per nursing documentation. Suspect dry weight is lower than current weight. Suspect pt with some degree of malnutrition but unable to confirm without NFPE.  Surgery PA has ordered Ensure Enlive TID. Pt accepted first dose this AM at 1128 per MAR. RD will also add Magic Cups to meal trays and trial PROSource Plus between meals.  Admit weight: 88.8 kg Current weight: 94.1 kg  Medications reviewed and include: dulcolax suppository, Ensure Enlive TID, SSI q 4 hours, IV protonix, amiodarone, heparin, IV abx, IV KCl 10 mEq x 4 followed by IV KCl 10 mEq x 5  Labs reviewed: potassium 2.7, BUN 26, hemoglobin 8.5, platelets 149 CBG's: 98-109 x 24 hours  UOP: 3115 ml x 24 hours I/O's: +7.0 L since admit  NUTRITION - FOCUSED PHYSICAL EXAM:  Unable to complete at this time. RD working remotely.  Diet Order:   Diet Order             Diet full liquid Room service appropriate? Yes; Fluid consistency: Thin  Diet effective now                   EDUCATION NEEDS:   Not appropriate for education at this time  Skin:  Skin Assessment: Skin Integrity Issues: DTI: sacrum Incisions: abd  Last BM:  10/26/22  Height:   Ht Readings from Last 1 Encounters:  10/22/22 5' 10"$  (1.778 m)  Weight:   Wt Readings from Last 1 Encounters:  10/27/22 94.1 kg    Ideal Body Weight:  75.5 kg  BMI:  Body mass index is 29.77 kg/m.  Estimated Nutritional Needs:   Kcal:  2100-2300  Protein:  115-130 grams  Fluid:  >2.0 L    Gustavus Bryant, MS, RD, LDN Inpatient Clinical Dietitian Please see AMiON for contact information.

## 2022-10-27 NOTE — Progress Notes (Signed)
5 Days Post-Op   Subjective/Chief Complaint: Pain controlled. Didn't drink much yesterday because he was tired. This AM had some jello, grape juice, few sips of broth without nausea. BM x3 total yesterday. Reports both flatus and belching, as well as mild heartburn. Denies nausea or vomiting. Daughter at bedside.   Pt and daughter tell me had  poor appeteite prior to surgery and was drinking some chocolate protein shakes from [employer] due to his low appetite   Objective: Vital signs in last 24 hours: Temp:  [97.7 F (36.5 C)-98.6 F (37 C)] 98.6 F (37 C) (02/14 0750) Pulse Rate:  [61-82] 72 (02/14 0700) Resp:  [16-25] 22 (02/14 0700) BP: (120-161)/(56-71) 140/67 (02/13 1700) SpO2:  [95 %-100 %] 98 % (02/14 0700) Arterial Line BP: (107-152)/(36-59) 141/55 (02/14 0700) Weight:  [94.1 kg] 94.1 kg (02/14 0523) Last BM Date : 10/26/22  Intake/Output from previous day: 02/13 0701 - 02/14 0700 In: 1884 [P.O.:305; I.V.:702.7; IV Piggyback:836.3] Out: [id number] [Urine:3115; Emesis/NG output:25; Stool:1] Intake/Output this shift: No intake/output data recorded.  Abd soft, appropriately tender, non-distended, honeycomb c/d/i  Lab Results:  Recent Labs    10/25/22 0154 10/27/22 0400  WBC 9.4 7.1  HGB 9.5* 8.5*  HCT 29.0* 26.2*  PLT 141* 149*   BMET Recent Labs    10/26/22 0545 10/27/22 0400  NA 139 137  K 3.5 2.7*  CL 104 97*  CO2 23 27  GLUCOSE 142* 105*  BUN 25* 26*  CREATININE 0.99 0.95  CALCIUM 8.2* 8.5*   PT/INR No results for input(s): "LABPROT", "INR" in the last 72 hours.  ABG No results for input(s): "PHART", "HCO3" in the last 72 hours.  Invalid input(s): "PCO2", "PO2"   Studies/Results: No results found.  Anti-infectives: Anti-infectives (From admission, onward)    Start     Dose/Rate Route Frequency Ordered Stop   10/25/22 1400  piperacillin-tazobactam (ZOSYN) IVPB 3.375 g        3.375 g 12.5 mL/hr over 240 Minutes Intravenous Every 8 hours 10/25/22  0805 10/30/22 0559   10/24/22 1000  vancomycin (VANCOREADY) IVPB 1500 mg/300 mL  Status:  Discontinued        1,500 mg 150 mL/hr over 120 Minutes Intravenous Every 24 hours 10/23/22 0853 10/24/22 0900   10/24/22 1000  piperacillin-tazobactam (ZOSYN) IVPB 3.375 g  Status:  Discontinued        3.375 g 12.5 mL/hr over 240 Minutes Intravenous Every 6 hours 10/24/22 0905 10/25/22 0805   10/24/22 0600  cefoTEtan (CEFOTAN) 2 g in sodium chloride 0.9 % 100 mL IVPB  Status:  Discontinued        2 g 200 mL/hr over 30 Minutes Intravenous On call to O.R. 10/23/22 1705 10/24/22 0900   10/23/22 0930  ceFEPIme (MAXIPIME) 2 g in sodium chloride 0.9 % 100 mL IVPB  Status:  Discontinued        2 g 200 mL/hr over 30 Minutes Intravenous Every 12 hours 10/23/22 0841 10/24/22 0900   10/23/22 0930  vancomycin (VANCOREADY) IVPB 2000 mg/400 mL        2,000 mg 200 mL/hr over 120 Minutes Intravenous  Once 10/23/22 0843 10/23/22 1454   10/23/22 0100  cefTRIAXone (ROCEPHIN) 1 g in sodium chloride 0.9 % 100 mL IVPB  Status:  Discontinued        1 g 200 mL/hr over 30 Minutes Intravenous Every 24 hours 10/23/22 0010 10/23/22 0830   10/23/22 0100  azithromycin (ZITHROMAX) 500 mg in sodium chloride 0.9 % 250  mL IVPB  Status:  Discontinued        500 mg 250 mL/hr over 60 Minutes Intravenous Every 24 hours 10/23/22 0010 10/23/22 0830   10/22/22 2013  sodium chloride 0.9 % with cefoTEtan (CEFOTAN) ADS Med       Note to Pharmacy: Lear Ng S: cabinet override      10/22/22 2013 10/22/22 2017   10/22/22 1800  ampicillin-sulbactam (UNASYN) injection 3 g  Status:  Discontinued        3 g Intravenous  Once 10/22/22 1758 10/22/22 1800   10/22/22 1800  Ampicillin-Sulbactam (UNASYN) 3 g in sodium chloride 0.9 % 100 mL IVPB        3 g 200 mL/hr over 30 Minutes Intravenous  Once 10/22/22 1800 10/23/22 0844       Assessment/Plan: Strangulated ventral hernia causing small bowel obstruction, necrotic small bowel  -POD#5 s/p  Exploratory laparotomy, primary ventral hernia repair, small bowel resection  10/22/22 Dr. Okey Dupre [facility] - having bowel function, abdomen soft, advance to FLD + ensure (pt with poor PO intake pre-dating this admission so I welcome dietician recs for protein/calorie supplementation) - c/o indigestion, denies nausea, continue protonix, add tums PRN     ID -compelted zosyn 4d post-op VTE - SCDs, hep gtt, cardiology would like to plan for Eliquis, please continue heparin for now.  FEN - IVF, FLD, ensure, hypokalemia (2.7). mg is 2.0  Foley - in place   I reviewed last 24 h vitals and pain scores, last 48 h intake and output, last 24 h labs and trends, and last 24 h imaging results.reviewed surgery from [facility], ccm notes and xrays as well.    This care required moderate level of medical decision making.   Darci Current Mcihael Hinderman PA-C 10/27/2022

## 2022-10-27 NOTE — Progress Notes (Addendum)
Rounding Note    Patient Name: Craig Fields Date of Encounter: 10/27/2022  [place] Cardiologist: Rozann Lesches, MD   Subjective   Patient resting in bed in no acute distress.  Patient denies any chest pain or shortness of breath.  He states he is doing great.  Patient states he is able to eat a little bit now.  Patient has not yet been able to get up to walk.  Inpatient Medications    Scheduled Meds:  aspirin  150 mg Rectal Daily   bisacodyl  10 mg Rectal Daily   Chlorhexidine Gluconate Cloth  6 each Topical Daily   insulin aspart  0-6 Units Subcutaneous Q4H   mouth rinse  15 mL Mouth Rinse Q2H   pantoprazole (PROTONIX) IV  40 mg Intravenous Q24H   Continuous Infusions:  sodium chloride     amiodarone 30 mg/hr (10/27/22 0600)   dextrose Stopped (10/26/22 1418)   heparin 1,300 Units/hr (10/27/22 0600)   norepinephrine (LEVOPHED) Adult infusion Stopped (10/26/22 0623)   piperacillin-tazobactam (ZOSYN)  IV 12.5 mL/hr at 10/27/22 0600   potassium chloride 10 mEq (10/27/22 0741)   PRN Meds: Place/Maintain arterial line **AND** sodium chloride, ipratropium-albuterol, lip balm, [DISCONTINUED] ondansetron **OR** ondansetron (ZOFRAN) IV, mouth rinse   Vital Signs    Vitals:   10/27/22 0630 10/27/22 0645 10/27/22 0700 10/27/22 0750  BP:      Pulse: 72 74 72   Resp: 20 (!) 24 (!) 22   Temp:    98.6 F (37 C)  TempSrc:    Axillary  SpO2: 97% 97% 98%   Weight:      Height:        Intake/Output Summary (Last 24 hours) at 10/27/2022 0757 Last data filed at 10/27/2022 0600 Gross per 24 hour  Intake 1883.99 ml  Output 3141 ml  Net -1257.01 ml      10/27/2022    5:23 AM 10/22/2022   10:29 PM 10/22/2022   11:37 AM  Last 3 Weights  Weight (lbs) 207 lb 7.3 oz 195 lb 12.3 oz 187 lb  Weight (kg) 94.1 kg 88.8 kg 84.823 kg      Telemetry    Normal sinus rhythm with PVCs noted- Personally Reviewed  ECG    No new EKG  Physical Exam   GEN: No acute  distress.   Neck: No JVD Cardiac: Grade 2/6 systolic murmur noted at right sternal border. Respiratory: Clear to auscultation bilaterally. GI: Soft, nontender, non-distended  MS: 1+ pitting edema noted to bilateral lower extremities Neuro:  Nonfocal  Psych: Normal affect   Labs    High Sensitivity Troponin:   Recent Labs  Lab 10/23/22 2232 10/24/22 0206 10/24/22 0536 10/24/22 2331 10/25/22 0154  TROPONINIHS 1,466* 1,659* 1,597* 972* 885*     Chemistry Recent Labs  Lab 10/22/22 1617 10/23/22 0430 10/23/22 1828 10/24/22 0206 10/24/22 1700 10/25/22 1851 10/26/22 0545 10/27/22 0400  NA 139 139  --  138   < > 140 139 137  K 4.4 3.8  --  3.0*   < > 2.7* 3.5 2.7*  CL 99 105  --  109   < > 105 104 97*  CO2 28 24  --  19*   < > 23 23 27  $ GLUCOSE 174* 124*  --  156*   < > 134* 142* 105*  BUN 23 25*  --  24*   < > 25* 25* 26*  CREATININE 1.08 1.08  --  0.90   < >  0.98 0.99 0.95  CALCIUM 10.4* 8.9  --  8.4*   < > 8.3* 8.2* 8.5*  MG  --   --    < > 1.6*  --  2.0  --  2.0  PROT 7.7 5.6*  --  5.1*  --   --   --   --   ALBUMIN 4.2 2.9*  --  2.3*  --   --   --   --   AST 28 33  --  22  --   --   --   --   ALT 15 11  --  11  --   --   --   --   ALKPHOS 39 26*  --  32*  --   --   --   --   BILITOT 1.2 1.0  --  0.7  --   --   --   --   GFRNONAA >60 >60  --  >60   < > >60 >60 >60  ANIONGAP 12 10  --  10   < > 12 12 13   $ < > = values in this interval not displayed.    Lipids  Recent Labs  Lab 10/23/22 0430  TRIG 56    Hematology Recent Labs  Lab 10/24/22 0206 10/25/22 0154 10/27/22 0400  WBC 10.9* 9.4 7.1  RBC 3.12* 2.99* 2.74*  HGB 9.9* 9.5* 8.5*  HCT 30.0* 29.0* 26.2*  MCV 96.2 97.0 95.6  MCH 31.7 31.8 31.0  MCHC 33.0 32.8 32.4  RDW 13.9 13.8 14.0  PLT 166 141* 149*   Thyroid No results for input(s): "TSH", "FREET4" in the last 168 hours.  BNPNo results for input(s): "BNP", "PROBNP" in the last 168 hours.  DDimer No results for input(s): "DDIMER" in the last 168  hours.   Radiology    No results found.  Cardiac Studies   Echocardiogram reviewed.  Troponins reviewed.  Patient Profile     82 y.o. male with past medical history of CAD status post CABG in 2024 who presented with abdominal pain, subsequently needed exploratory laparotomy with 80 cm of small bowel removal with new acute heart failure with reduced ejection fraction.  Assessment & Plan    1.  Acute heart failure with reduced ejection fraction: Heart failure likely tachycardia mediated.  Blood pressure started to improve and off pressors.  Patient heart rate in the 70s.  Likely can resume lisinopril today.  Can also potentially start Jardiance as kidney function remains stable.  Will likely want to hold metoprolol at this time.  Will likely need repeat echo in about 6 to 8 weeks to follow-up ejection fraction.  Will need to slowly introduce GDMT.  2.  NSTEMI likely type II: Troponins have peaked.  No acute concerns or chest pain at this time.  No acute concerns for ACS at this time.  This is likely secondary to demand ischemia.  3.  Atrial fibrillation: Patient seem to convert back to normal sinus rhythm at this time.  Patient is currently on IV amiodarone, can transition to oral amiodarone, as patient is able to tolerate p.o. intake.  Yesterday, patient was discontinued on clopidogrel.  Patient currently on IV heparin.  Patient will likely need apixaban, once surgery and CCM agree to start and can ensure that he will not need further procedures.  Continue IV heparin for now, and plan to transition to apixaban once CCM and surgery agree.  CHA2DS2-VASc 7.  4.  Moderately severe aortic  stenosis: No acute concerns at this time.  Likely contributing to acute heart failure.  Will likely need CT angiography and cath in the future if plan is to pursue TAVR.  Patient not currently ready for TAVR at this point given critical illness.    For questions or updates, please contact [place] Please consult [email or web address] for contact info under     Signed, Leigh Aurora, DO  Internal Medicine Resident PGY-1 10/27/2022, 7:57 AM     Patient seen, examined. Available data reviewed. Agree with findings, assessment, and plan as outlined by Dr Posey Pronto.  On exam, the patient is awake and alert, in no distress, elderly appearing male.  Daughter at bedside.  HEENT is normal, JVP is normal, lungs are clear bilaterally, heart is regular rate and rhythm with a 2/6 harsh late peaking systolic murmur at the right upper sternal border, abdomen with a well-healed surgical incision, soft, and nontender, extremities with 2+ edema in the right foot and 1+ edema on the left.  Recommendations as outlined above.  Will discontinue IV amiodarone and change to oral amiodarone 400 mg twice daily now that the patient is taking p.o.  Will continue IV heparin until cleared by the surgical team to start oral anticoagulation.  Patient's blood pressure has stabilized, so we will add lisinopril 5 mg daily (was on 10 mg at home).  Will likely start him on a beta-blocker tomorrow pending his heart rhythm.  Otherwise as outlined above.  We anticipate further follow-up and cardiac testing in several weeks once he has fully recovered from surgery and his critical illness.  Sherren Mocha, M.D. 10/27/2022 9:55 AM

## 2022-10-27 NOTE — TOC Initial Note (Signed)
Transition of Care [facility]) - Initial/Assessment Note    Patient Details  Name: Craig Fields MRN: [id number] Date of Birth: 1941-08-28  Transition of Care [facility]) CM/SW Contact:    Milinda Antis, [place] Phone Number: 10/27/2022, 10:47 AM  Clinical Narrative:                 CSW received consult for possible SNF placement at time of discharge. CSW spoke with patient and patient's family at bedside.  Patient and family expressed understanding of PT recommendation and is agreeable to SNF placement at time of discharge.  The family request that if the patient progresses, the preference would be for the patient to go to inpatient rehab.  If patient is not a candidate for inpatient rehab, the patient reports preference for a facility "close to home" ([place]) . CSW discussed insurance authorization process and will provide Medicare SNF ratings list. CSW will send out referrals for review when patient is closer to being medically ready.   Skilled Nursing Rehab Facilities-   [email or web address]   Ratings out of 5 stars (5 the highest)   Name Address  Phone # Parker Inspection Overall  [facility] [place] [address] [place] 4 5 2 3  $ [facility]  [address] [place], [place] [phone] 4 2 5 5  $ [facility] [place], [place] 1 3 1 1  $ [place], [place] 2 2 4 4  $ [facility] [address], [place] 2 1 2 1  $ [place] [address], [place] [phone] 3 3 4 4  $ [facility] [address], [place] 4 1 3 2  $ [facility] [address], [place] 4 1 3 2  $ [address] ([place]) [place], [place] [phone] 3 1 2 1  $ [facility] [phone] [address], [place] [phone] 3 1 1 1  $ [facility] [address] [facility] [phone] 3 2  2 2  $ [facility] [place]. [place], [place] [phone] 3 1 1 1  $ Dustin Flock [address] [phone] 4 2 4 4          $ [place] [address] [place] 4 1 3 2  $ [facility] [place] [address] [place] 3 1 5 4  $ [facility], [place], [place] [phone] 1 1 2 1  $ [facility] [address], [place] [phone] 2 2 4 4          $ [place] (no [facility]) [place] [facility] [address], [place] [phone] 5 5 5 5  $ [facility] (No Humana) [address], [place] 4 1 4 3  $ [facility] (No UHC) [place], [place] 5 5 5 5  $ [place] [address] [facility] [phone] 2 3 5 5  $ [place] [address], [place] 1 1 2 1  $ [place] [address], [address] 3 1 1 1  $ [place], [place] 5 2 5 5  $ [facility]  [address], [place] 2 2 1 1  $ [facility] [address], [place] 3 2 1 1  $ [facility] [place], [place] 2 2 2 $ 2  [place], [place] [phone] 1 1 1 1  $ [place] [address], [place]  [phone] 2 4 3 3  $ [facility] [address], [place] [phone] 3 2 3 3  $ [facility] [place] [address] [place] 2 1 1 1  $ [place] (No Humana) [address] [place], [place] [phone] 2 2 3 3  $ [facility]) [address] [place] [address], [address] [phone] 2 1 1 1          $ [facility] [place], [place] 5 4 5 5  $ [facility])  [facility] [address], [place] 2 1 2 1  $ [facility]) [place] [address] [place] 3 1 4 3  $ [facility] [place] [address] [place] 3 3 4 4  $ [address] [place], [place] 2 3 1 1  $ [address] [facility]) [address] [place] [phone] 2 1 4 3    $ Expected Discharge Plan: [place] Barriers to Discharge: Continued Medical Work up   Patient Goals and CMS Choice Patient states their goals for this hospitalization and ongoing recovery are:: To go to rehab and return home CMS Medicare.gov Compare Post Acute Care list provided to:: Patient Choice offered to / list presented to : Patient, Spouse, Adult Children      Expected Discharge Plan and Services In-house Referral: Clinical Social Work     Living arrangements for the past 2 months: Single Family Home                                      Prior Living Arrangements/Services Living arrangements for the past 2 months: Single Family Home Lives with:: Spouse Patient language and need for interpreter reviewed:: Yes Do you feel safe going back to the place where you live?: Yes      Need for Family Participation in Patient Care: Yes (Comment) Care giver support system in place?: Yes (comment)   Criminal Activity/Legal Involvement Pertinent to Current Situation/Hospitalization: No - Comment as needed  Activities of Daily Living Home Assistive Devices/Equipment: Brace (specify type), Cane (specify quad or straight) ADL Screening (condition at time of admission) Patient's cognitive ability adequate to safely complete daily activities?: Yes Is the patient deaf or have difficulty hearing?: No Does the patient have difficulty seeing, even when wearing glasses/contacts?: No Does the patient have difficulty concentrating, remembering, or making decisions?: No Patient able to express need for assistance with ADLs?: Yes Does the patient have difficulty dressing or bathing?: Yes Independently performs ADLs?: No Communication: Independent Dressing (OT): Needs  assistance Is this a change from baseline?: Pre-admission baseline Grooming: Needs assistance Is this a change from baseline?: Pre-admission baseline Feeding: Independent Bathing: Needs assistance Is this a change from baseline?: Pre-admission baseline Toileting: Independent In/Out Bed: Needs assistance Is this a change from baseline?: Pre-admission baseline Walks in Home: Independent with device (comment) Does the patient have difficulty walking or climbing stairs?: Yes Weakness of Legs: Left Weakness of Arms/Hands: Left  Permission Sought/Granted   Permission granted to share information with : Yes, Verbal Permission Granted     Permission granted to share info w AGENCY: SNF        Emotional Assessment Appearance:: Appears older than stated age Attitude/Demeanor/Rapport: Engaged, Gracious Affect (typically observed): Accepting, Pleasant Orientation: : Oriented to Situation, Oriented to  Time, Oriented to Place, Oriented to Self Alcohol / Substance Use: Not Applicable Psych Involvement: No (comment)  Admission diagnosis:  Incarcerated hernia [K46.0] Patient Active Problem List   Diagnosis Date Noted   Atrial fibrillation with rapid ventricular response ([place]) 10/26/2022   HFrEF (heart failure with reduced ejection fraction) ([place]) 10/26/2022   Incarcerated hernia 10/22/2022   Strangulated ventral hernia 10/22/2022   Cystitis 07/02/2022   Skin lesion of face 07/02/2022   Valvular heart disease 05/27/2021   H/O colon cancer, stage I 05/27/2021   Recurrent incisional hernia    Left leg weakness 02/28/2013   Difficulty walking 02/28/2013   Prediabetes 07/19/2011   Back pain 07/26/2010   Hemiplegia, 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/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   Hyperlipidemia LDL goal <70 02/21/2008   Obesity (BMI 30.0-34.9) 02/21/2008   Essential hypertension, benign 02/21/2008   Coronary atherosclerosis of native coronary artery 02/21/2008   SLEEP APNEA 02/21/2008   PCP:   Fayrene Helper, MD Pharmacy[id number] [place] [address]Phone: [phone]: [phone]    Social Determinants of Health (SDOH) Social History: SDOH Screenings   Food Insecurity: No Food Insecurity (10/23/2022)  Housing: Low Risk  (10/23/2022)  Transportation Needs: No Transportation Needs (10/23/2022)  Utilities: Not At Risk (10/23/2022)  Alcohol Screen: Low Risk  (06/28/2022)  Depression (PHQ2-9): Low Risk  (07/02/2022)  Financial Resource Strain: Low Risk  (06/28/2022)  Physical Activity: Inactive (06/28/2022)  Social Connections: Moderately Integrated (06/28/2022)  Stress: No Stress Concern Present (06/28/2022)  Tobacco Use: Medium Risk (10/22/2022)   SDOH Interventions: Housing Interventions: Intervention Not Indicated   Readmission Risk Interventions     No data to display

## 2022-10-27 NOTE — Progress Notes (Addendum)
Patient had a bloody tinged bowl movement. Urine has also become increasingly more pink tinged. CCM MD notified, CBC along with type & screen ordered. To continue heparin gtt per CCM MD.

## 2022-10-27 NOTE — Progress Notes (Signed)
[facility] ADULT ICU REPLACEMENT PROTOCOL   The patient does apply for the [facility] Adult ICU Electrolyte Replacment Protocol based on the criteria listed below:   1.Exclusion criteria: TCTS, ECMO, Dialysis, and Myasthenia Gravis patients 2. Is GFR >/= 30 ml/min? Yes.    Patient's GFR today is >60 3. Is SCr </= 2? Yes.   Patient's SCr is 0.95 mg/dL 4. Did SCr increase >/= 0.5 in 24 hours? No. 5.Pt's weight >40kg  Yes.   6. Abnormal electrolyte(s):   K 2.7  7. Electrolytes replaced per protocol 8.  Call MD STAT for K+ </= 2.5, Phos </= 1, or Mag </= 1 Physician:  A. Danice Goltz R Nilay Mangrum 10/27/2022 4:51 AM;

## 2022-10-27 NOTE — Progress Notes (Signed)
NAME:  Craig Fields, MRN:  [id number], DOB:  09/27/1940, LOS: 5 ADMISSION DATE:  10/22/2022, CONSULTATION DATE:  2/10 REFERRING MD:  Manuella Ghazi, CHIEF COMPLAINT: Respiratory failure and septic shock  History of Present Illness:  82 year old male patient with history as mentioned below most significantly known Ventral hernia.  Admitted on 2/9 with abdominal pain, nausea and vomiting CT abdomen showed ventral hernia with small bowel loops and surrounding soft tissue edema concerning for incarcerated hernia and resultant small bowel obstruction, he is brought emergently to the operating room where he underwent exploratory laparotomy, ventral hernia repair, and bowel resection.  Postoperative course complicated by ongoing hypotension, requiring norepinephrine in spite of volume resuscitation, ongoing ventilator dependence.  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 Went for exploration laparotomy with small bowel resection and ventral hernia repair on 2/9 for strangulated ventral hernia resulting in necrotic small bowel. 80 cm of small bowel resected  2/10 transferred to [facility] for on-going NE needs and need for Mechanical ventilation  2/11 trops mildly elevated, pressor stable   Interim History / Subjective:  Bps better Poor appetite Total assist with getting up yesterday +flatus and BM  Objective   Blood pressure (!) 140/67, pulse 74, temperature 98.6 F (37 C), temperature source Axillary, resp. rate (!) 23, height 5' 10"$  (1.778 m), weight 94.1 kg, SpO2 100 %. CVP:  [7 mmHg-9 mmHg] 9 mmHg      Intake/Output Summary (Last 24 hours) at 10/27/2022 0910 Last data filed  at 10/27/2022 0800 Gross per 24 hour  Intake 1971.73 ml  Output 3116 ml  Net -1144.27 ml    Filed Weights   10/22/22 1137 10/22/22 2229 10/27/22 0523  Weight: 84.8 kg 88.8 kg 94.1 kg    Examination: No distress Profoundly weak L arm paresis stable Trace anasarca Scattered bruising Midline incision looks okay Brown/sedimenty urine   Resolved Hospital Problem list   Postoperative shock state- some combination of fluid shifts, distributive/septic, and cardiogenic; resolved Postoperative ileus- improved Perioperative aspiration event- off vent 2/11, improving Afib/RVR, NSTEMI, aortic stenosis- cardiology following Volume overloaded state of heart- stable, diuresed about a liter yesterday  Assessment & Plan:  S/P exploratory lap 2/9 for strangulated ventral hernia resulting in necrotic small bowel. 80 cm of small bowel resected  Hx CVA with left sided weakness Hx OSA, HLD, HTN Poor appetite, moderate to severe protein calorie malnutrition POA- issue even pre-op, will be big barrier going forward Profound muscular deconditioning  - PT/OT help appreciated, probably is going to need SNF unless makes some progress - No plans for cath, appreciate cardiology help with GDMT, plans to transition to PO amio noted - Watch h/h I think his urine is more full of sediment than blood; if stable tomorrow am, can transition to eliquis, reviewed with cards - RD consult to help with nutrition - Replete K, consider restarting lasix tomorrow PO or IV - Zosyn until 10/29/22 - Postop wound care per CCS  Best Practice (right click and "Reselect all SmartList Selections" daily)   Diet/type: advance as tolerated DVT prophylaxis: heparin gtt GI prophylaxis: PPI Lines: DC A line and CVL Foley:  Yes, and it is still needed Code Status:  full code Last  date of multidisciplinary goals of care discussion [Family at bedside]  Stable for transfer to progressive, appreciate TRH taking over starting  10/28/21  Erskine Emery MD PCCM

## 2022-10-28 ENCOUNTER — Encounter (HOSPITAL_COMMUNITY): Payer: Self-pay | Admitting: Surgery

## 2022-10-28 ENCOUNTER — Other Ambulatory Visit (HOSPITAL_COMMUNITY): Payer: Self-pay

## 2022-10-28 DIAGNOSIS — I5020 Unspecified systolic (congestive) heart failure: Secondary | ICD-10-CM | POA: Diagnosis not present

## 2022-10-28 DIAGNOSIS — K460 Unspecified abdominal hernia with obstruction, without gangrene: Secondary | ICD-10-CM | POA: Diagnosis not present

## 2022-10-28 LAB — URINALYSIS, ROUTINE W REFLEX MICROSCOPIC: Hgb urine dipstick: NEGATIVE

## 2022-10-28 LAB — URINALYSIS, MICROSCOPIC (REFLEX): RBC / HPF: >50 RBC/hpf (ref 0–5)

## 2022-10-28 LAB — BASIC METABOLIC PANEL
Anion gap: 10 (ref 5–15)
BUN: 24 mg/dL — ABNORMAL HIGH (ref 8–23)
CO2: 25 mmol/L (ref 22–32)
Calcium: 8.4 mg/dL — ABNORMAL LOW (ref 8.9–10.3)
Chloride: 103 mmol/L (ref 98–111)
Creatinine, Ser: 0.9 mg/dL (ref 0.61–1.24)
GFR, Estimated: >60 mL/min (ref >60)
Glucose, Bld: 131 mg/dL — ABNORMAL HIGH (ref 70–99)
Potassium: 3.1 mmol/L — ABNORMAL LOW (ref 3.5–5.1)
Sodium: 138 mmol/L (ref 135–145)

## 2022-10-28 LAB — GLUCOSE, CAPILLARY
Glucose-Capillary: 103 mg/dL — ABNORMAL HIGH (ref 70–99)
Glucose-Capillary: 104 mg/dL — ABNORMAL HIGH (ref 70–99)
Glucose-Capillary: 128 mg/dL — ABNORMAL HIGH (ref 70–99)
Glucose-Capillary: 144 mg/dL — ABNORMAL HIGH (ref 70–99)
Glucose-Capillary: 150 mg/dL — ABNORMAL HIGH (ref 70–99)
Glucose-Capillary: 155 mg/dL — ABNORMAL HIGH (ref 70–99)

## 2022-10-28 LAB — CULTURE, BLOOD (ROUTINE X 2)
Culture: NO GROWTH
Culture: NO GROWTH
Special Requests: ADEQUATE
Special Requests: ADEQUATE

## 2022-10-28 LAB — HEPARIN LEVEL (UNFRACTIONATED): Heparin Unfractionated: 0.39 IU/mL (ref 0.30–0.70)

## 2022-10-28 LAB — CBC
HCT: 24.8 % — ABNORMAL LOW (ref 39.0–52.0)
Hemoglobin: 8.5 g/dL — ABNORMAL LOW (ref 13.0–17.0)
MCH: 32.3 pg (ref 26.0–34.0)
MCHC: 34.3 g/dL (ref 30.0–36.0)
MCV: 94.3 fL (ref 80.0–100.0)
Platelets: 176 10*3/uL (ref 150–400)
RBC: 2.63 MIL/uL — ABNORMAL LOW (ref 4.22–5.81)
RDW: 14 % (ref 11.5–15.5)
WBC: 9.5 10*3/uL (ref 4.0–10.5)
nRBC: 0 % (ref 0.0–0.2)

## 2022-10-28 LAB — MAGNESIUM: Magnesium: 1.8 mg/dL (ref 1.7–2.4)

## 2022-10-28 LAB — PHOSPHORUS: Phosphorus: 2.2 mg/dL — ABNORMAL LOW (ref 2.5–4.6)

## 2022-10-28 MED ORDER — ACETAMINOPHEN 325 MG PO TABS
650.0000 mg | ORAL_TABLET | ORAL | Status: DC | PRN
  Administered 2022-10-28 – 2022-11-01 (×13): 650 mg via ORAL
  Filled 2022-10-28 (×14): qty 2

## 2022-10-28 MED ORDER — LOSARTAN POTASSIUM 25 MG PO TABS
12.5000 mg | ORAL_TABLET | Freq: Every day | ORAL | Status: DC
  Administered 2022-10-28 – 2022-11-01 (×5): 12.5 mg via ORAL
  Filled 2022-10-28 (×5): qty 1

## 2022-10-28 MED ORDER — POTASSIUM CHLORIDE 10 MEQ/100ML IV SOLN
10.0000 meq | INTRAVENOUS | Status: AC
  Administered 2022-10-28 (×3): 10 meq via INTRAVENOUS
  Filled 2022-10-28 (×2): qty 100

## 2022-10-28 MED ORDER — MELATONIN 3 MG PO TABS
3.0000 mg | ORAL_TABLET | Freq: Every day | ORAL | Status: DC
  Administered 2022-10-28 – 2022-10-29 (×2): 3 mg via ORAL
  Filled 2022-10-28 (×2): qty 1

## 2022-10-28 MED ORDER — METOPROLOL TARTRATE 12.5 MG HALF TABLET
12.5000 mg | ORAL_TABLET | Freq: Two times a day (BID) | ORAL | Status: DC
  Administered 2022-10-28 – 2022-10-29 (×2): 12.5 mg via ORAL
  Filled 2022-10-28 (×3): qty 1

## 2022-10-28 MED ORDER — MAGNESIUM SULFATE 2 GM/50ML IV SOLN
2.0000 g | Freq: Once | INTRAVENOUS | Status: AC
  Administered 2022-10-28: 2 g via INTRAVENOUS
  Filled 2022-10-28: qty 50

## 2022-10-28 MED ORDER — APIXABAN 5 MG PO TABS
5.0000 mg | ORAL_TABLET | Freq: Two times a day (BID) | ORAL | Status: DC
  Administered 2022-10-28 – 2022-11-01 (×9): 5 mg via ORAL
  Filled 2022-10-28 (×9): qty 1

## 2022-10-28 MED ORDER — K PHOS MONO-SOD PHOS DI & MONO 155-852-130 MG PO TABS
500.0000 mg | ORAL_TABLET | Freq: Three times a day (TID) | ORAL | Status: AC
  Administered 2022-10-28 (×3): 500 mg via ORAL
  Filled 2022-10-28 (×3): qty 2

## 2022-10-28 MED ORDER — POTASSIUM CHLORIDE CRYS ER 20 MEQ PO TBCR
40.0000 meq | EXTENDED_RELEASE_TABLET | Freq: Once | ORAL | Status: AC
  Administered 2022-10-28: 40 meq via ORAL
  Filled 2022-10-28: qty 2

## 2022-10-28 NOTE — Progress Notes (Addendum)
[place] for heparin gtt > Eliquis Indication: atrial fibrillation  Allergies  Allergen Reactions   Bee Venom Swelling   Niacin Other (See Comments)    Muscle aches    Patient Measurements: Height: 5' 10"$  (177.8 cm) Weight: 95.3 kg (210 lb 1.6 oz) IBW/kg (Calculated) : 73 Heparin Dosing Weight: 88.8 kg   Vital Signs: Temp: 98.3 F (36.8 C) (02/15 0802) Temp Source: Oral (02/15 0802) BP: 126/64 (02/15 0802) Pulse Rate: 84 (02/15 0802)  Labs: Recent Labs    10/26/22 0545 10/26/22 1821 10/27/22 0400 10/27/22 1540 10/27/22 1920 10/28/22 0027  HGB   < >  --  8.5*  --  8.7* 8.5*  HCT  --   --  26.2*  --  26.6* 24.8*  PLT  --   --  149*  --  181 176  HEPARINUNFRC  --  0.36 0.56  --   --  0.39  CREATININE  --   --  0.95 0.95  --  0.90   < > = values in this interval not displayed.     Estimated Creatinine Clearance: 72 mL/min (by C-G formula based on SCr of 0.9 mg/dL).   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 Sepsis now resolving. However, afib continues. Pt on amiodarone infusion. Pharmacy consulted to dose heparin infusion for afib -   Heparin level at goal this AM.  Reported with some blood in stool, per surgery not unexpected.   Okay to continue heparin drip.  CBC stable  Goal of Therapy:  Heparin level 0.3-0.7 units/ml Monitor platelets by anticoagulation protocol: Yes   Plan:  Discussed with Dr. Burt Knack, okay to switch to Eliquis today. Will start Eliquis 5 mg po BID. Will educate pt prior to discharge.  [place] Crane, Roylene Reason, [facility] Clinical Pharmacist  10/28/2022 9:54 AM   [facility] pharmacy phone numbers are listed on [email or web address]

## 2022-10-28 NOTE — Progress Notes (Signed)
   Heart Failure Stewardship Pharmacist Progress Note   PCP: Fayrene Helper, MD PCP-Cardiologist: Rozann Lesches, MD    HPI:  82 yo M with PMH of hernia, CAD s/p CABG, prior CVA, HTN, HLD.   Presented to the ED on 2/9 with increasing abdominal pain with N/V. Known incarcerated ventral hernia. Found to have small bowl obstruction and was taken to the OR for repair and bowel resection on 2/9.  He remained intubated postoperatively and was in circulatory shock requiring IV antibiotics and pressor support. Patient was then extubated on 2/11.   An ECHO was done on 2/11 and showed LVEF reduced to 20-25% (was 55-60% in 08/2021), regional wall motion abnormalities, G2DD, RV mildly reduced, moderate aortic stenosis. Cardiology was consulted.   Current HF Medications: Beta Blocker: metoprolol tartrate 12.5 mg BID ACE/ARB/ARNI: losartan 12.5 mg daily  Prior to admission HF Medications: Beta blocker: metoprolol tartrate 25 mg BID ACE/ARB/ARNI: lisinopril 10 mg daily  Pertinent Lab Values: Serum creatinine 0.90, BUN 24, Potassium 3.1, Sodium 138, Magnesium 1.8  Vital Signs: Weight: 210 lbs (admission weight: 195 lbs) Blood pressure: 120/60s Heart rate: 80s  I/O: incomplete  Medication Assistance / Insurance Benefits Check: Does the patient have prescription insurance?  Yes Type of insurance plan: AARP Medicare  Does the patient qualify for medication assistance through manufacturers or grants?   Pending Eligible grants and/or patient assistance programs: pending Medication assistance applications in progress: none  Medication assistance applications approved: none Approved medication assistance renewals will be completed by: pending  Outpatient Pharmacy:  [facility] Is the patient willing to use [place] at discharge? Yes Is the patient willing to transition their outpatient pharmacy to utilize a [facility] outpatient pharmacy?    Pending    Assessment: 1. Acute systolic CHF (LVEF 53-61%). NYHA class II symptoms. - Not volume overloaded on exam, no indication for diuretics. Strict I/Os and daily weights. Keep K>4 and Mg>2. KCl 40 mEq and magnesium 2 g IV x 1 given for replacement. - Continue metoprolol tartrate 12.5 mg BID. Will need to consolidate to metoprolol XL before discharge. - Agree with transition lisinopril to losartan 12.5 mg daily. Unfortunately, received both meds this AM. Monitor BP closely.  - Consider adding MRA and SGLT2i prior to discharge vs at follow up pending BP trends  Plan: 1) Medication changes recommended at this time: - Agree with changes; monitor BP closely today  2) Patient assistance: - Farxiga/Jardiance copay $47  3)  Education  - Patient has been educated on current HF medications and potential additions to HF medication regimen - Patient verbalizes understanding that over the next few months, these medication doses may change and more medications may be added to optimize HF regimen - Patient has been educated on basic disease state pathophysiology and goals of therapy   Kerby Nora, PharmD, BCPS Heart Failure Stewardship Pharmacist Phone [phone]

## 2022-10-28 NOTE — Progress Notes (Signed)
[facility] PROGRESS NOTE  Craig Fields (DOB: 09/27/1940) [id number] PCP: Fayrene Helper, MD  Brief Narrative: Craig Fields is an 82 year old male patient with history as mentioned below most significantly known Ventral hernia. Admitted on 2/9 with abdominal pain, nausea and vomiting CT abdomen showed ventral hernia with small bowel loops and surrounding soft tissue edema concerning for incarcerated hernia and resultant small bowel obstruction, he is brought emergently to the operating room where he underwent exploratory laparotomy, ventral hernia repair, and bowel resection. Postoperative course complicated by ongoing hypotension, requiring norepinephrine in spite of volume resuscitation, ongoing ventilator dependence. [facility] services.   Subjective: Feeling fine, pain controlled, CIR is recommended, pt amenable.  Objective: BP (!) 107/57   Pulse 80   Temp 98.1 F (36.7 C) (Oral)   Resp 20   Ht 5' 10"$  (1.778 m)   Wt 95.3 kg   SpO2 96%   BMI 30.15 kg/m   Gen: Elderly male in no distress Pulm: Clear, nonlabored  CV: RRR, +III/VI SE at RUSB, no JVD, trace edema GI: Soft, minimally tender, nondistended, hypoactive bowel sounds Neuro: Alert and oriented. No new focal deficits. Ext: Warm, no deformities Skin: Incision c/d/I. No new rashes, lesions or ulcers on visualized skin   Assessment & Plan: Postoperative shock: Combination of septic and cariogenic with fluid shifts. Resolved.  Acute hypoxic respiratory failure due to perioperative aspiration event: Extubated 2/11.  - Pulmonary hygiene   Strangulated ventral hernia: s/p ex lap with 80cm of necrotic bowel resection 2/9.  - Postoperative recommendations per general surgery, advancing diet, postop ileus resolved, had return of bowel function. Wound care orders in place.  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  - Switched to losartan per  cardiology, on metoprolol, plan to repeat echo in 6-8 weeks after GDMT.  - Volume status ok  PAF with RVR:  - Cardiology following, has converted to NSR. Plan to continue amiodarone po - Changed heparin to eliquis, ok'ed by surgery. Note H/H stable, will continue monitoring  NSTEMI: Demand myocardial ischemia, no signs of ACS currently.  - No plans for LHC at this time. No current chest pain. Continue ASA, BB  Aortic stenosis:  - Cardiology follow up recommended.   Dark urine: No significant dysuria. Unclear whether this represents any true hematuria. Pt reports no pneumaturia.  - UA, culture pending.   History of CVA with left hemiparesis: Increases risk of deconditioning.  - CIR consulted  OSA:  - CPAP qHS  Hypokalemia:  - Supplement  Hypophosphatemia:  - Supplement, monitor  HTN:  - Metoprolol, losartan  Moderate protein calorie malnutrition despite obesity: Inadequate calorie intake to meet catabolic needs.  - Supp protein, RD consult  [name], MD [facility] [email or web address] 10/28/2022, 5:01 PM

## 2022-10-28 NOTE — Progress Notes (Signed)
Physical Therapy Treatment Patient Details Name: Craig Fields MRN: [id number] DOB: Nov 13, 1940 Today's Date: 10/28/2022   History of Present Illness 82 yo admitted 2/9 with incarcerated hernia causing SBO s/p ex lap same date at [facility]. Transferred to [facility] 2/10 with septic shock and Afib. Extubated 2/11. PMhx: HLD, obesity, HTN, CVA with hemiplegia, colon CA, sleep apnea    PT Comments    Pt making slow progress with mobility. Demonstrated improved sitting balance today. Believe pt could benefit from AIR for intensive therapies. Expect pt would still need some physical assist at DC even after AIR. Family has been supportive.    Recommendations for follow up therapy are one component of a multi-disciplinary discharge planning process, led by the attending physician.  Recommendations may be updated based on patient status, additional functional criteria and insurance authorization.  Follow Up Recommendations  Acute inpatient rehab (3hours/day)     Assistance Recommended at Discharge Frequent or constant Supervision/Assistance  Patient can return home with the following Two people to help with walking and/or transfers;Two people to help with bathing/dressing/bathroom;Assistance with cooking/housework;Assistance with feeding;Direct supervision/assist for medications management;Assist for transportation   Equipment Recommendations  Hospital bed;Other (comment) (hoyer)    Recommendations for Other Services       Precautions / Restrictions Precautions Precautions: Fall Precaution Comments: lt hand contracture Required Braces or Orthoses: Other Brace Other Brace: bil built up shoes, boot on left Restrictions Weight Bearing Restrictions: No     Mobility  Bed Mobility Overal bed mobility: Needs Assistance Bed Mobility: Rolling, Supine to Sit, Sit to Supine Rolling: +2 for physical assistance, Total assist   Supine to sit: Total assist, +2 for physical assistance Sit to supine:  Total assist, +2 for physical assistance   General bed mobility comments: Assist for all aspects    Transfers                   General transfer comment: Did not attempt    Ambulation/Gait                   Stairs             Wheelchair Mobility    Modified Rankin (Stroke Patients Only)       Balance Overall balance assessment: Needs assistance Sitting-balance support: Single extremity supported Sitting balance-Leahy Scale: Poor Sitting balance - Comments: Pt sat EOB x 15 minutes with initial mod assist and then progressing to min to min guard. Postural control: Posterior lean, Right lateral lean                                  Cognition Arousal/Alertness: Awake/alert Behavior During Therapy: WFL for tasks assessed/performed Overall Cognitive Status: Impaired/Different from baseline Area of Impairment: Following commands, Problem solving                       Following Commands: Follows one step commands consistently, Follows one step commands with increased time     Problem Solving: Slow processing, Requires verbal cues          Exercises      General Comments General comments (skin integrity, edema, etc.): VSS. Family present and supportive      Pertinent Vitals/Pain Pain Assessment Pain Assessment: Faces Faces Pain Scale: Hurts little more Pain Location: BUE's with touch/movement Pain Descriptors / Indicators: Grimacing, Guarding Pain Intervention(s): Limited activity within patient's tolerance, Repositioned, Monitored  during session    Home Living                          Prior Function            PT Goals (current goals can now be found in the care plan section) Acute Rehab PT Goals Patient Stated Goal: return home Progress towards PT goals: Progressing toward goals    Frequency    Min 2X/week      PT Plan Discharge plan needs to be updated    Co-evaluation               AM-PAC PT "6 Clicks" Mobility   Outcome Measure  Help needed turning from your back to your side while in a flat bed without using bedrails?: Total Help needed moving from lying on your back to sitting on the side of a flat bed without using bedrails?: Total Help needed moving to and from a bed to a chair (including a wheelchair)?: Total Help needed standing up from a chair using your arms (e.g., wheelchair or bedside chair)?: Total Help needed to walk in hospital room?: Total Help needed climbing 3-5 steps with a railing? : Total 6 Click Score: 6    End of Session   Activity Tolerance: Patient tolerated treatment well Patient left: in bed;with call bell/phone within reach;with family/visitor present;with bed alarm set Nurse Communication: Mobility status;Need for lift equipment PT Visit Diagnosis: Other abnormalities of gait and mobility (R26.89);Muscle weakness (generalized) (M62.81);Difficulty in walking, not elsewhere classified (R26.2)     Time: [id number] PT Time Calculation (min) (ACUTE ONLY): 33 min  Charges:  $Therapeutic Activity: 23-37 mins                     [place]ool[place] 10/28/2022, 1:45 PM

## 2022-10-28 NOTE — TOC Benefit Eligibility Note (Signed)
Patient Teacher, English as a foreign language completed.    The patient is currently admitted and upon discharge could be taking Eliquis 5 mg.  The current 30 day co-pay is $47.00.   The patient is currently admitted and upon discharge could be taking Jardiance 47m.  The current 30 day co-pay is $47.00.   The patient is currently admitted and upon discharge could be taking Farxiga 10 mg.  The current 30 day co-pay is $47.00.   The patient is insured through [facility] [facility] Special[facility]isonPatient Advocate Team Direct Number: [phone] Fax: [phone]

## 2022-10-28 NOTE — Progress Notes (Signed)
6 Days Post-Op   Subjective/Chief Complaint: Pain controlled. Didn't drink much yesterday because he was tired. This AM had some jello, grape juice, few sips of broth without nausea. BM x3 total yesterday. Reports both flatus and belching, as well as mild heartburn. Denies nausea or vomiting. Daughter at bedside.   Pt and daughter tell me had  poor appeteite prior to surgery and was drinking some chocolate protein shakes from [employer] due to his low appetite   Objective: Vital signs in last 24 hours: Temp:  [97.5 F (36.4 C)-99.6 F (37.6 C)] 98.3 F (36.8 C) (02/15 0802) Pulse Rate:  [72-84] 84 (02/15 0802) Resp:  [18-30] 19 (02/15 0802) BP: (103-152)/(54-82) 126/64 (02/15 0802) SpO2:  [95 %-100 %] 96 % (02/15 0802) Arterial Line BP: (117-133)/(38-55) 119/41 (02/14 1115) Weight:  [95.3 kg] 95.3 kg (02/15 0400) Last BM Date : 10/27/22  Intake/Output from previous day: 02/14 0701 - 02/15 0700 In: 399.3 [I.V.:196.1; IV Piggyback:203.2] Out: 750 [Urine:750] Intake/Output this shift: Total I/O In: 240 [P.O.:240] Out: -   Abd soft, appropriately tender, non-distended, honeycomb c/d/i  Lab Results:  Recent Labs    10/27/22 1920 10/28/22 0027  WBC 10.4 9.5  HGB 8.7* 8.5*  HCT 26.6* 24.8*  PLT 181 176   BMET Recent Labs    10/27/22 1540 10/28/22 0027  NA 138 138  K 3.5 3.1*  CL 102 103  CO2 26 25  GLUCOSE 133* 131*  BUN 26* 24*  CREATININE 0.95 0.90  CALCIUM 8.6* 8.4*   PT/INR No results for input(s): "LABPROT", "INR" in the last 72 hours.  ABG No results for input(s): "PHART", "HCO3" in the last 72 hours.  Invalid input(s): "PCO2", "PO2"   Studies/Results: No results found.  Anti-infectives: Anti-infectives (From admission, onward)    Start     Dose/Rate Route Frequency Ordered Stop   10/25/22 1400  piperacillin-tazobactam (ZOSYN) IVPB 3.375 g        3.375 g 12.5 mL/hr over 240 Minutes Intravenous Every 8 hours 10/25/22 0805 10/30/22 0559   10/24/22  1000  vancomycin (VANCOREADY) IVPB 1500 mg/300 mL  Status:  Discontinued        1,500 mg 150 mL/hr over 120 Minutes Intravenous Every 24 hours 10/23/22 0853 10/24/22 0900   10/24/22 1000  piperacillin-tazobactam (ZOSYN) IVPB 3.375 g  Status:  Discontinued        3.375 g 12.5 mL/hr over 240 Minutes Intravenous Every 6 hours 10/24/22 0905 10/25/22 0805   10/24/22 0600  cefoTEtan (CEFOTAN) 2 g in sodium chloride 0.9 % 100 mL IVPB  Status:  Discontinued        2 g 200 mL/hr over 30 Minutes Intravenous On call to O.R. 10/23/22 1705 10/24/22 0900   10/23/22 0930  ceFEPIme (MAXIPIME) 2 g in sodium chloride 0.9 % 100 mL IVPB  Status:  Discontinued        2 g 200 mL/hr over 30 Minutes Intravenous Every 12 hours 10/23/22 0841 10/24/22 0900   10/23/22 0930  vancomycin (VANCOREADY) IVPB 2000 mg/400 mL        2,000 mg 200 mL/hr over 120 Minutes Intravenous  Once 10/23/22 0843 10/23/22 1454   10/23/22 0100  cefTRIAXone (ROCEPHIN) 1 g in sodium chloride 0.9 % 100 mL IVPB  Status:  Discontinued        1 g 200 mL/hr over 30 Minutes Intravenous Every 24 hours 10/23/22 0010 10/23/22 0830   10/23/22 0100  azithromycin (ZITHROMAX) 500 mg in sodium chloride 0.9 % 250  mL IVPB  Status:  Discontinued        500 mg 250 mL/hr over 60 Minutes Intravenous Every 24 hours 10/23/22 0010 10/23/22 0830   10/22/22 2013  sodium chloride 0.9 % with cefoTEtan (CEFOTAN) ADS Med       Note to Pharmacy: Lear Ng S: cabinet override      10/22/22 2013 10/22/22 2017   10/22/22 1800  ampicillin-sulbactam (UNASYN) injection 3 g  Status:  Discontinued        3 g Intravenous  Once 10/22/22 1758 10/22/22 1800   10/22/22 1800  Ampicillin-Sulbactam (UNASYN) 3 g in sodium chloride 0.9 % 100 mL IVPB        3 g 200 mL/hr over 30 Minutes Intravenous  Once 10/22/22 1800 10/23/22 0844       Assessment/Plan: Strangulated ventral hernia causing small bowel obstruction, necrotic small bowel  -POD#6 s/p Exploratory laparotomy, primary  ventral hernia repair, small bowel resection  10/22/22 Dr. Okey Dupre [facility] - having bowel function, abdomen soft, advance to soft diet - per  report he had a blood tinged bowel movement, no unexpected for a dark bloody BM after bowel resection, monitor. Hgb stable - c/o indigestion, denies nausea, continue protonix, add tums PRN   Dark brown/possibly blood-tinged urine - patient has a personal history of colon cancer s/p sigmoidectomy in 2007, last colonoscopy in the system was 2016. No known history diverticulitis. Check UA/Urine Culture. May have a CV fistula. This is not a surgical emergency. Would not surgically fix during this admission. May need CT with rectal contrast to further evaluate    ID -compelted zosyn 4d post-op VTE - SCDs, hep gtt, ok to start DOAC FEN -  Soft, IVf per primary  Foley - external in place   I reviewed last 24 h vitals and pain scores, last 48 h intake and output, last 24 h labs and trends, and last 24 h imaging results.reviewed surgery from [facility], ccm notes and xrays as well.    This care required moderate level of medical decision making.   Darci Current Lonn Im PA-C 10/28/2022

## 2022-10-28 NOTE — Care Management Important Message (Signed)
Important Message  Patient Details  Name: Craig Fields MRN: [id number] Date of Birth: 09/18/1940   Medicare Important Message Given:  Yes     Hannah Beat 10/28/2022, 2:37 PM

## 2022-10-28 NOTE — Progress Notes (Signed)
Inpatient Rehab Admissions Coordinator:   Discussed with PT.  Will place CIR consult and an [facility] will f/u at bedside.   Shann Medal, PT, DPT Admissions Coordinator [phone] 10/28/22  4:50 PM

## 2022-10-28 NOTE — Progress Notes (Addendum)
Rounding Note    Patient Name: Craig Fields Show Date of Encounter: 10/28/2022  [place] Cardiologist: Rozann Lesches, MD   Subjective   Overnight events: Patient had a blood bowel movement noted, CBC obtain, and it was stable.   Patient is resting in bed in no acute distress.  Patient states no complaints this morning. Patient reports no chest pain or shortness of breath.   Inpatient Medications    Scheduled Meds:  (feeding supplement) PROSource Plus  30 mL Oral BID BM   amiodarone  400 mg Oral BID   aspirin EC  81 mg Oral Daily   bisacodyl  10 mg Rectal Daily   Chlorhexidine Gluconate Cloth  6 each Topical Daily   feeding supplement  237 mL Oral TID WC   insulin aspart  0-6 Units Subcutaneous Q4H   lisinopril  5 mg Oral Daily   mouth rinse  15 mL Mouth Rinse Q2H   pantoprazole (PROTONIX) IV  40 mg Intravenous Q24H   phosphorus  500 mg Oral TID   Continuous Infusions:  sodium chloride     sodium chloride     dextrose 10 mL/hr at 10/27/22 1800   heparin 1,300 Units/hr (10/27/22 2257)   piperacillin-tazobactam (ZOSYN)  IV 3.375 g (10/28/22 0554)   potassium chloride     PRN Meds: Place/Maintain arterial line **AND** sodium chloride, sodium chloride, calcium carbonate, ipratropium-albuterol, lip balm, [DISCONTINUED] ondansetron **OR** ondansetron (ZOFRAN) IV, mouth rinse   Vital Signs    Vitals:   10/27/22 2048 10/28/22 0012 10/28/22 0359 10/28/22 0400  BP:  (!) 124/56 125/60   Pulse:  76 81   Resp:   (!) 23   Temp: 98.9 F (37.2 C) 98.1 F (36.7 C) 98 F (36.7 C)   TempSrc: Oral Oral Oral   SpO2:  100% 98%   Weight:    95.3 kg  Height:        Intake/Output Summary (Last 24 hours) at 10/28/2022 0743 Last data filed at 10/28/2022 0555 Gross per 24 hour  Intake 399.3 ml  Output 750 ml  Net -350.7 ml      10/28/2022    4:00 AM 10/27/2022    5:23 AM 10/22/2022   10:29 PM  Last 3 Weights  Weight (lbs) 210 lb 1.6 oz 207 lb 7.3 oz 195 lb 12.3 oz   Weight (kg) 95.301 kg 94.1 kg 88.8 kg      Telemetry    Patient remains in normal sinus rhythm - Personally Reviewed  ECG    No new ECG  Physical Exam  GEN: No acute distress.   Neck: No JVD Cardiac: RRR, Grade II/VI systolic murmur noted to right sternal border Respiratory: Clear to auscultation bilaterally. GI: Soft, nontender, non-distended  GU: Urine is looking darker with sediment noted  MS: No edema; No deformity. Neuro:  Nonfocal  Psych: Normal affect   Labs    High Sensitivity Troponin:   Recent Labs  Lab 10/23/22 2232 10/24/22 0206 10/24/22 0536 10/24/22 2331 10/25/22 0154  TROPONINIHS 1,466* 1,659* 1,597* 972* 885*     Chemistry Recent Labs  Lab 10/22/22 1617 10/23/22 0430 10/23/22 1828 10/24/22 0206 10/24/22 1700 10/25/22 1851 10/26/22 0545 10/27/22 0400 10/27/22 1540 10/28/22 0027  NA 139 139  --  138   < > 140   < > 137 138 138  K 4.4 3.8  --  3.0*   < > 2.7*   < > 2.7* 3.5 3.1*  CL 99 105  --  109   < > 105   < > 97* 102 103  CO2 28 24  --  19*   < > 23   < > 27 26 25  $ GLUCOSE 174* 124*  --  156*   < > 134*   < > 105* 133* 131*  BUN 23 25*  --  24*   < > 25*   < > 26* 26* 24*  CREATININE 1.08 1.08  --  0.90   < > 0.98   < > 0.95 0.95 0.90  CALCIUM 10.4* 8.9  --  8.4*   < > 8.3*   < > 8.5* 8.6* 8.4*  MG  --   --    < > 1.6*  --  2.0  --  2.0  --  1.8  PROT 7.7 5.6*  --  5.1*  --   --   --   --   --   --   ALBUMIN 4.2 2.9*  --  2.3*  --   --   --   --   --   --   AST 28 33  --  22  --   --   --   --   --   --   ALT 15 11  --  11  --   --   --   --   --   --   ALKPHOS 39 26*  --  32*  --   --   --   --   --   --   BILITOT 1.2 1.0  --  0.7  --   --   --   --   --   --   GFRNONAA >60 >60  --  >60   < > >60   < > >60 >60 >60  ANIONGAP 12 10  --  10   < > 12   < > 13 10 10   $ < > = values in this interval not displayed.    Lipids  Recent Labs  Lab 10/23/22 0430  TRIG 56    Hematology Recent Labs  Lab 10/27/22 0400 10/27/22 1920  10/28/22 0027  WBC 7.1 10.4 9.5  RBC 2.74* 2.76* 2.63*  HGB 8.5* 8.7* 8.5*  HCT 26.2* 26.6* 24.8*  MCV 95.6 96.4 94.3  MCH 31.0 31.5 32.3  MCHC 32.4 32.7 34.3  RDW 14.0 14.0 14.0  PLT 149* 181 176   Thyroid No results for input(s): "TSH", "FREET4" in the last 168 hours.  BNPNo results for input(s): "BNP", "PROBNP" in the last 168 hours.  DDimer No results for input(s): "DDIMER" in the last 168 hours.   Radiology    No results found.  Cardiac Studies   Echo reviewed, telemetry reviewed   Patient Profile     82 y.o. male with past medical history of CAD status post CABG in 2024 who presented with abdominal pain, subsequently needed exploratory laparotomy with 80 cm of small bowel removal with new acute heart failure with reduced ejection fraction.   Assessment & Plan    1.  Acute heart failure with reduced ejection fraction: Heart failure likely tachycardia mediated. Blood pressure remain stable while off pressors. Patients rate rate remains in the 70s. On exam patient RRR with known murmur. Patient can switch to losartan today as well as add on metoprolol today. Will likely need repeat echo in about 6 to 8 weeks to follow-up ejection fraction.  Will need to slowly introduce GDMT.  2.  NSTEMI likely type II: Troponins have peaked.  No acute concerns or chest pain at this time.  No acute concerns for ACS at this time.  This is likely secondary to demand ischemia.   3.  Atrial fibrillation: Patient remains in normal sinus rhythm. Patient has been trasnitioned to oral amio and can reamin on this. Patient also remamind on IV heparin. Patient did have blood bowel movement per charting yesterday and seems to have blood tinged urine.  Did speak with surgery PA and they might be considering imaging to evaluate for a fistula. Surgery did state that he could start [place] back as even if there is a fistula, will not go for a repair  For now continue oral amiodarone, keep patient on tele. Can add  on apixiban today. CHA2DS2-VASc 7.   4.  Moderately severe aortic stenosis: No acute concerns at this time.  Likely contributing to acute heart failure.  Will likely need CT angiography and cath in the future if plan is to pursue TAVR.  Patient not currently ready for TAVR at this point given critical illness.    For questions or updates, please contact [place] Please consult [email or web address] for contact info under    Signed, Leigh Aurora, DO  Internal Medicine Resident PGY-1 10/28/2022, 7:43 AM     Patient seen, examined. Available data reviewed. Agree with findings, assessment, and plan as outlined by Dr Posey Pronto.  The patient is awake and alert, in no distress.  HEENT is normal, JVP is normal, lungs are clear bilaterally, heart is regular rate and rhythm with a 2/6 harsh mid peaking systolic murmur best heard at the apex, A2 is present, abdomen is soft and nontender, extremities have no significant edema.  The patient is clinically improving.  We have reviewed the notes of the surgical service.  Will discontinue IV heparin and start him on apixaban today.  Will change him from low-dose lisinopril to a low-dose of losartan.  Add metoprolol succinate 12.5 mg daily.  Will continue to follow and titrate his medical therapy for heart failure.  Telemetry is reviewed and he is maintaining sinus rhythm.  Otherwise plans as outlined above.  Sherren Mocha, M.D. 10/28/2022 10:46 AM

## 2022-10-29 DIAGNOSIS — K460 Unspecified abdominal hernia with obstruction, without gangrene: Secondary | ICD-10-CM | POA: Diagnosis not present

## 2022-10-29 DIAGNOSIS — K436 Other and unspecified ventral hernia with obstruction, without gangrene: Secondary | ICD-10-CM | POA: Diagnosis not present

## 2022-10-29 DIAGNOSIS — I4891 Unspecified atrial fibrillation: Secondary | ICD-10-CM | POA: Diagnosis not present

## 2022-10-29 DIAGNOSIS — I5020 Unspecified systolic (congestive) heart failure: Secondary | ICD-10-CM

## 2022-10-29 LAB — GLUCOSE, CAPILLARY
Glucose-Capillary: 108 mg/dL — ABNORMAL HIGH (ref 70–99)
Glucose-Capillary: 120 mg/dL — ABNORMAL HIGH (ref 70–99)
Glucose-Capillary: 121 mg/dL — ABNORMAL HIGH (ref 70–99)
Glucose-Capillary: 121 mg/dL — ABNORMAL HIGH (ref 70–99)
Glucose-Capillary: 131 mg/dL — ABNORMAL HIGH (ref 70–99)
Glucose-Capillary: 164 mg/dL — ABNORMAL HIGH (ref 70–99)

## 2022-10-29 LAB — CBC
HCT: 23.8 % — ABNORMAL LOW (ref 39.0–52.0)
Hemoglobin: 7.7 g/dL — ABNORMAL LOW (ref 13.0–17.0)
MCH: 31.3 pg (ref 26.0–34.0)
MCHC: 32.4 g/dL (ref 30.0–36.0)
MCV: 96.7 fL (ref 80.0–100.0)
Platelets: 214 10*3/uL (ref 150–400)
RBC: 2.46 MIL/uL — ABNORMAL LOW (ref 4.22–5.81)
RDW: 14.1 % (ref 11.5–15.5)
WBC: 9.3 10*3/uL (ref 4.0–10.5)
nRBC: 0 % (ref 0.0–0.2)

## 2022-10-29 LAB — URINE CULTURE: Culture: NO GROWTH

## 2022-10-29 LAB — BASIC METABOLIC PANEL
Anion gap: 13 (ref 5–15)
BUN: 27 mg/dL — ABNORMAL HIGH (ref 8–23)
CO2: 22 mmol/L (ref 22–32)
Calcium: 8.3 mg/dL — ABNORMAL LOW (ref 8.9–10.3)
Chloride: 105 mmol/L (ref 98–111)
Creatinine, Ser: 0.8 mg/dL (ref 0.61–1.24)
GFR, Estimated: >60 mL/min (ref >60)
Glucose, Bld: 128 mg/dL — ABNORMAL HIGH (ref 70–99)
Potassium: 3.3 mmol/L — ABNORMAL LOW (ref 3.5–5.1)
Sodium: 140 mmol/L (ref 135–145)

## 2022-10-29 LAB — PHOSPHORUS: Phosphorus: 3.4 mg/dL (ref 2.5–4.6)

## 2022-10-29 LAB — MAGNESIUM: Magnesium: 2.1 mg/dL (ref 1.7–2.4)

## 2022-10-29 MED ORDER — POTASSIUM CHLORIDE CRYS ER 20 MEQ PO TBCR
40.0000 meq | EXTENDED_RELEASE_TABLET | Freq: Once | ORAL | Status: AC
  Administered 2022-10-29: 40 meq via ORAL
  Filled 2022-10-29: qty 2

## 2022-10-29 MED ORDER — METOPROLOL SUCCINATE ER 25 MG PO TB24
12.5000 mg | ORAL_TABLET | Freq: Every day | ORAL | Status: DC
  Administered 2022-10-30 – 2022-11-01 (×3): 12.5 mg via ORAL
  Filled 2022-10-29 (×3): qty 1

## 2022-10-29 MED ORDER — INSULIN ASPART 100 UNIT/ML IJ SOLN
0.0000 [IU] | Freq: Three times a day (TID) | INTRAMUSCULAR | Status: DC
  Administered 2022-10-29: 1 [IU] via SUBCUTANEOUS

## 2022-10-29 NOTE — Progress Notes (Incomplete)
7 Days Post-Op   Subjective/Chief Complaint: Pain controlled. Tolerating soft diet. +BMs, still with decreased appetite  Objective: Vital signs in last 24 hours: Temp:  [97.3 F (36.3 C)-98.5 F (36.9 C)] 98.3 F (36.8 C) (02/16 0814) Pulse Rate:  [69-81] 72 (02/16 0814) Resp:  [18-22] 20 (02/16 0814) BP: (89-112)/(46-58) 112/58 (02/16 0814) SpO2:  [94 %-98 %] 96 % (02/16 0814) Weight:  [96.3 kg-97.8 kg] 97.8 kg (02/16 0625) Last BM Date : 10/29/22  Intake/Output from previous day: 02/15 0701 - 02/16 0700 In: 240 [P.O.:240] Out: 500 [Urine:500] Intake/Output this shift: No intake/output data recorded.  Abd soft, appropriately tender, non-distended, staples c/d/i GU: external cath, dark yellow cloudy urine Lab Results:  Recent Labs    10/28/22 0027 10/29/22 0055  WBC 9.5 9.3  HGB 8.5* 7.7*  HCT 24.8* 23.8*  PLT 176 214   BMET Recent Labs    10/28/22 0027 10/29/22 0055  NA 138 140  K 3.1* 3.3*  CL 103 105  CO2 25 22  GLUCOSE 131* 128*  BUN 24* 27*  CREATININE 0.90 0.80  CALCIUM 8.4* 8.3*   PT/INR No results for input(s): "LABPROT", "INR" in the last 72 hours.  ABG No results for input(s): "PHART", "HCO3" in the last 72 hours.  Invalid input(s): "PCO2", "PO2"   Studies/Results: No results found.  Anti-infectives: Anti-infectives (From admission, onward)    Start     Dose/Rate Route Frequency Ordered Stop   10/25/22 1400  piperacillin-tazobactam (ZOSYN) IVPB 3.375 g        3.375 g 12.5 mL/hr over 240 Minutes Intravenous Every 8 hours 10/25/22 0805 10/30/22 0559   10/24/22 1000  vancomycin (VANCOREADY) IVPB 1500 mg/300 mL  Status:  Discontinued        1,500 mg 150 mL/hr over 120 Minutes Intravenous Every 24 hours 10/23/22 0853 10/24/22 0900   10/24/22 1000  piperacillin-tazobactam (ZOSYN) IVPB 3.375 g  Status:  Discontinued        3.375 g 12.5 mL/hr over 240 Minutes Intravenous Every 6 hours 10/24/22 0905 10/25/22 0805   10/24/22 0600  cefoTEtan  (CEFOTAN) 2 g in sodium chloride 0.9 % 100 mL IVPB  Status:  Discontinued        2 g 200 mL/hr over 30 Minutes Intravenous On call to O.R. 10/23/22 1705 10/24/22 0900   10/23/22 0930  ceFEPIme (MAXIPIME) 2 g in sodium chloride 0.9 % 100 mL IVPB  Status:  Discontinued        2 g 200 mL/hr over 30 Minutes Intravenous Every 12 hours 10/23/22 0841 10/24/22 0900   10/23/22 0930  vancomycin (VANCOREADY) IVPB 2000 mg/400 mL        2,000 mg 200 mL/hr over 120 Minutes Intravenous  Once 10/23/22 0843 10/23/22 1454   10/23/22 0100  cefTRIAXone (ROCEPHIN) 1 g in sodium chloride 0.9 % 100 mL IVPB  Status:  Discontinued        1 g 200 mL/hr over 30 Minutes Intravenous Every 24 hours 10/23/22 0010 10/23/22 0830   10/23/22 0100  azithromycin (ZITHROMAX) 500 mg in sodium chloride 0.9 % 250 mL IVPB  Status:  Discontinued        500 mg 250 mL/hr over 60 Minutes Intravenous Every 24 hours 10/23/22 0010 10/23/22 0830   10/22/22 2013  sodium chloride 0.9 % with cefoTEtan (CEFOTAN) ADS Med       Note to Pharmacy: Lear Ng S: cabinet override      10/22/22 2013 10/22/22 2017   10/22/22 1800  ampicillin-sulbactam (UNASYN) injection 3 g  Status:  Discontinued        3 g Intravenous  Once 10/22/22 1758 10/22/22 1800   10/22/22 1800  Ampicillin-Sulbactam (UNASYN) 3 g in sodium chloride 0.9 % 100 mL IVPB        3 g 200 mL/hr over 30 Minutes Intravenous  Once 10/22/22 1800 10/23/22 0844       Assessment/Plan: Strangulated ventral hernia causing small bowel obstruction, necrotic small bowel  -POD#10 s/p Exploratory laparotomy, primary ventral hernia repair, small bowel resection  10/22/22 Dr. Okey Dupre [facility] - having bowel function, abdomen soft, continue soft diet - stable for D/C to SNF from surgical standpoint. Outpatient follow up with Dr. Okey Dupre, recommend D/C staples POD#14 (Friday 2/23).   Dark brown urine - noted last week. patient has a personal history of colon cancer s/p sigmoidectomy in 2007, last  colonoscopy in the system was 2016. No known history diverticulitis. UA 2/15 with many bacteria, urine culture NGTD. There was a question of a possible CV fistula -- his urine is clearing up compared to last week but still appears abnormal. Pt asymptomatic currently. This is not a surgical emergency. Would not surgically fix during this admission. May need CT with rectal contrast to further evaluate - this can be worked up outpatient If it continues to be a concern.    ID - compelted zosyn 4d post-op VTE - SCDs, Eliquis started 2/15 >>  FEN -  Soft,  Foley - external in place   I reviewed last 24 h vitals and pain scores, last 48 h intake and output, last 24 h labs and trends, and last 24 h imaging results.reviewed surgery from [facility], ccm notes and xrays as well.    This care required moderate level of medical decision making.   Darci Current Shekelia Boutin PA-C 10/29/2022

## 2022-10-29 NOTE — Progress Notes (Signed)
Occupational Therapy Treatment Patient Details Name: Craig Fields MRN: [id number] DOB: Dec 21, 1940 Today's Date: 10/29/2022   History of present illness 82 yo admitted 2/9 with incarcerated hernia causing SBO s/p ex lap same date at [facility]. Transferred to [facility] 2/10 with septic shock and Afib. Extubated 2/11. PMhx: HLD, obesity, HTN, CVA with hemiplegia, colon CA, sleep apnea   OT comments  Pt making steady progress towards OT goals this session. Pt continues to present with impaired sitting balance, generalized weakness and global deconditioning . Session focus on sitting balance, bed mobility and BADL reeducation. Pt currently requires total A +2 for all aspects of bed mobility, pt able to sit EOB for 15 mins with up to MOD A for static sitting balance. Worked on below therex from EOB as well as ADLs from EOB. Pt highly motivated to return to PLOF however feel pt would benefit from slower pace of SNF vs CIR. Pt would continue to benefit from skilled occupational therapy while admitted and after d/c to address the below listed limitations in order to improve overall functional mobility and facilitate independence with BADL participation. DC plan remains appropriate, will follow acutely per POC.      Recommendations for follow up therapy are one component of a multi-disciplinary discharge planning process, led by the attending physician.  Recommendations may be updated based on patient status, additional functional criteria and insurance authorization.    Follow Up Recommendations  Skilled nursing-short term rehab (<3 hours/day)     Assistance Recommended at Discharge Frequent or constant Supervision/Assistance  Patient can return home with the following  A lot of help with walking and/or transfers;Two people to help with walking and/or transfers;Two people to help with bathing/dressing/bathroom;A lot of help with bathing/dressing/bathroom;Direct supervision/assist for medications  management;Direct supervision/assist for financial management;Assist for transportation;Help with stairs or ramp for entrance   Equipment Recommendations  None recommended by OT (defer)    Recommendations for Other Services      Precautions / Restrictions Precautions Precautions: Fall Precaution Comments: 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 Required Braces or Orthoses: Other Brace Other Brace: bil built up shoes, boot on left Restrictions Weight Bearing Restrictions: No       Mobility Bed Mobility Overal bed mobility: Needs Assistance Bed Mobility: Rolling, Supine to Sit, Sit to Supine Rolling: +2 for physical assistance, Total assist   Supine to sit: Total assist, +2 for physical assistance Sit to supine: Total assist, +2 for physical assistance        Transfers                   General transfer comment: deferred as pt fatigues just from sitting EOB     Balance Overall balance assessment: Needs assistance Sitting-balance support: Single extremity supported Sitting balance-Leahy Scale: Poor Sitting balance - Comments: pt sat EOB ~ 15 mins with an emphasis on ADLs from EOB, anterior weight shifting to facilitate improved trunk control for ADLs and BLE therex, pt with moments of CGA but ultimately required MODA Postural control: Posterior lean, Right lateral lean                                 ADL either performed or assessed with clinical judgement   ADL Overall ADL's : Needs assistance/impaired     Grooming: Wash/dry face;Sitting;Maximal assistance Grooming Details (indicate cue type and reason): pt unabel to acess face from EOB needing MAX A  Functional mobility during ADLs: Total assistance;+2 for physical assistance;+2 for safety/equipment (bed mobility only) General ADL Comments: ADL participation impacted by decreased activity tolerance, impaired balance and global deconditioning     Extremity/Trunk Assessment Upper Extremity Assessment Upper Extremity Assessment: RUE deficits/detail;LUE deficits/detail RUE Deficits / Details: decreased AROM in RUE during functioanl reaching, limited to ~ 100* shoulder flexion RUE Coordination: decreased fine motor;decreased gross motor LUE Deficits / Details: contracted, does not wear brace per report LUE Sensation: decreased proprioception;decreased light touch LUE Coordination: decreased fine motor;decreased gross motor   Lower Extremity Assessment Lower Extremity Assessment: Defer to PT evaluation        Vision Baseline Vision/History: 1 Wears glasses Vision Assessment?: No apparent visual deficits   Perception Perception Perception: Not tested   Praxis Praxis Praxis: Not tested    Cognition Arousal/Alertness: Awake/alert Behavior During Therapy: WFL for tasks assessed/performed Overall Cognitive Status: Impaired/Different from baseline Area of Impairment: Memory, Problem solving                     Memory: Decreased short-term memory (repeated same stories)       Problem Solving: Slow processing, Requires verbal cues General Comments: noted memory deficits, likely baseline as family didnt seem to be alarmed, overall slow to process but appropriate and following commands        Exercises General Exercises - Lower Extremity Long Arc Quad: Strengthening, Right, 10 reps, Seated Other Exercises Other Exercises: seated marches in RLE from EOB x10 reps    Shoulder Instructions       General Comments daughter and grandson assisted with session, pt on RA, RR up to 33 HR max 88 bpm    Pertinent Vitals/ Pain       Pain Assessment Pain Assessment: No/denies pain  Home Living                                          Prior Functioning/Environment              Frequency  Min 2X/week        Progress Toward Goals  OT Goals(current goals can now be found in the care plan  section)  Progress towards OT goals: Progressing toward goals (incrementally)  Acute Rehab OT Goals Patient Stated Goal: to go to rehab OT Goal Formulation: With patient/family Time For Goal Achievement: 11/09/22 Potential to Achieve Goals: Good  Plan Discharge plan remains appropriate;Frequency remains appropriate    Co-evaluation                 AM-PAC OT "6 Clicks" Daily Activity     Outcome Measure   Help from another person eating meals?: A Lot Help from another person taking care of personal grooming?: A Lot Help from another person toileting, which includes using toliet, bedpan, or urinal?: Total Help from another person bathing (including washing, rinsing, drying)?: A Lot Help from another person to put on and taking off regular upper body clothing?: A Lot Help from another person to put on and taking off regular lower body clothing?: Total 6 Click Score: 10    End of Session    OT Visit Diagnosis: Unsteadiness on feet (R26.81);Other abnormalities of gait and mobility (R26.89);Muscle weakness (generalized) (M62.81)   Activity Tolerance Patient tolerated treatment well   Patient Left in bed;with call bell/phone within reach;with bed alarm set   Nurse Communication  Time: [id number] OT Time Calculation (min): 30 min  Charges: OT General Charges $OT Visit: 1 Visit OT Treatments $Self Care/Home Management : 8-22 mins $Therapeutic Activity: 8-22 mins  Harley Alto., COTA/L Acute Rehabilitation Services [phone]   Precious Haws 10/29/2022, 3:09 PM

## 2022-10-29 NOTE — Progress Notes (Signed)
7 Days Post-Op   Subjective/Chief Complaint: A lot of loose stool, tol diet   Objective: Vital signs in last 24 hours: Temp:  [97.3 F (36.3 C)-98.5 F (36.9 C)] 98.3 F (36.8 C) (02/16 0814) Pulse Rate:  [69-81] 72 (02/16 0814) Resp:  [18-22] 20 (02/16 0814) BP: (89-112)/(46-58) 112/58 (02/16 0814) SpO2:  [94 %-98 %] 96 % (02/16 0814) Weight:  [96.3 kg-97.8 kg] 97.8 kg (02/16 0625) Last BM Date : 10/29/22  Intake/Output from previous day: 02/15 0701 - 02/16 0700 In: 240 [P.O.:240] Out: 500 [Urine:500] Intake/Output this shift: Total I/O In: 120 [P.O.:120] Out: -    Abd soft, appropriately tender, non-distended, honeycomb c/d/i   Lab Results:  Recent Labs    10/28/22 0027 10/29/22 0055  WBC 9.5 9.3  HGB 8.5* 7.7*  HCT 24.8* 23.8*  PLT 176 214   BMET Recent Labs    10/28/22 0027 10/29/22 0055  NA 138 140  K 3.1* 3.3*  CL 103 105  CO2 25 22  GLUCOSE 131* 128*  BUN 24* 27*  CREATININE 0.90 0.80  CALCIUM 8.4* 8.3*   PT/INR No results for input(s): "LABPROT", "INR" in the last 72 hours. ABG No results for input(s): "PHART", "HCO3" in the last 72 hours.  Invalid input(s): "PCO2", "PO2"  Studies/Results: No results found.  Anti-infectives: Anti-infectives (From admission, onward)    Start     Dose/Rate Route Frequency Ordered Stop   10/25/22 1400  piperacillin-tazobactam (ZOSYN) IVPB 3.375 g        3.375 g 12.5 mL/hr over 240 Minutes Intravenous Every 8 hours 10/25/22 0805 10/30/22 0559   10/24/22 1000  vancomycin (VANCOREADY) IVPB 1500 mg/300 mL  Status:  Discontinued        1,500 mg 150 mL/hr over 120 Minutes Intravenous Every 24 hours 10/23/22 0853 10/24/22 0900   10/24/22 1000  piperacillin-tazobactam (ZOSYN) IVPB 3.375 g  Status:  Discontinued        3.375 g 12.5 mL/hr over 240 Minutes Intravenous Every 6 hours 10/24/22 0905 10/25/22 0805   10/24/22 0600  cefoTEtan (CEFOTAN) 2 g in sodium chloride 0.9 % 100 mL IVPB  Status:  Discontinued         2 g 200 mL/hr over 30 Minutes Intravenous On call to O.R. 10/23/22 1705 10/24/22 0900   10/23/22 0930  ceFEPIme (MAXIPIME) 2 g in sodium chloride 0.9 % 100 mL IVPB  Status:  Discontinued        2 g 200 mL/hr over 30 Minutes Intravenous Every 12 hours 10/23/22 0841 10/24/22 0900   10/23/22 0930  vancomycin (VANCOREADY) IVPB 2000 mg/400 mL        2,000 mg 200 mL/hr over 120 Minutes Intravenous  Once 10/23/22 0843 10/23/22 1454   10/23/22 0100  cefTRIAXone (ROCEPHIN) 1 g in sodium chloride 0.9 % 100 mL IVPB  Status:  Discontinued        1 g 200 mL/hr over 30 Minutes Intravenous Every 24 hours 10/23/22 0010 10/23/22 0830   10/23/22 0100  azithromycin (ZITHROMAX) 500 mg in sodium chloride 0.9 % 250 mL IVPB  Status:  Discontinued        500 mg 250 mL/hr over 60 Minutes Intravenous Every 24 hours 10/23/22 0010 10/23/22 0830   10/22/22 2013  sodium chloride 0.9 % with cefoTEtan (CEFOTAN) ADS Med       Note to Pharmacy: Lear Ng S: cabinet override      10/22/22 2013 10/22/22 2017   10/22/22 1800  ampicillin-sulbactam (UNASYN) injection  3 g  Status:  Discontinued        3 g Intravenous  Once 10/22/22 1758 10/22/22 1800   10/22/22 1800  Ampicillin-Sulbactam (UNASYN) 3 g in sodium chloride 0.9 % 100 mL IVPB        3 g 200 mL/hr over 30 Minutes Intravenous  Once 10/22/22 1800 10/23/22 0844       Assessment/Plan: Strangulated ventral hernia causing small bowel obstruction, necrotic small bowel  -POD#7 s/p Exploratory laparotomy, primary ventral hernia repair, small bowel resection  10/22/22 Dr. Okey Dupre [facility] - having bowel function, abdomen soft, soft diet   ID -compelted zosyn 4d post-op VTE - SCDs, hep gtt, ok to start DOAC FEN -  Soft, IVf per primary  Can dc home per primary Will follow up Monday, call if any concerns   Rolm Bookbinder 10/29/2022

## 2022-10-29 NOTE — TOC Progression Note (Signed)
Transition of Care [facility]) - Progression Note    Patient Details  Name: Craig Fields MRN: [id number] Date of Birth: Jul 03, 1941  [facility] CM/SW Contact  Zenon Mayo, RN Phone Number: 10/29/2022, 4:27 PM  Clinical Narrative:    Per Pamala Hurry with CIR , she is rec SNF , she spoke with son and grandson they are interested in [facility], will make CSW aware.    Expected Discharge Plan: [place] Barriers to Discharge: Continued Medical Work up  Expected Discharge Plan and Services In-house Referral: Clinical Social Work     Living arrangements for the past 2 months: Single Family Home                                       Social Determinants of Health (SDOH) Interventions SDOH Screenings   Food Insecurity: No Food Insecurity (10/23/2022)  Housing: Low Risk  (10/23/2022)  Transportation Needs: No Transportation Needs (10/23/2022)  Utilities: Not At Risk (10/23/2022)  Alcohol Screen: Low Risk  (06/28/2022)  Depression (PHQ2-9): Low Risk  (07/02/2022)  Financial Resource Strain: Low Risk  (06/28/2022)  Physical Activity: Inactive (06/28/2022)  Social Connections: Moderately Integrated (06/28/2022)  Stress: No Stress Concern Present (06/28/2022)  Tobacco Use: Medium Risk (10/28/2022)    Readmission Risk Interventions     No data to display

## 2022-10-29 NOTE — Progress Notes (Signed)
[facility] PROGRESS NOTE  Maruice Mitman Cihlar (DOB: 1941/02/08) [id number] PCP: Fayrene Helper, MD  Brief Narrative: Landynn Engert Smither is an 82 year old male patient with history as mentioned below most significantly known Ventral hernia. Admitted on 2/9 with abdominal pain, nausea and vomiting CT abdomen showed ventral hernia with small bowel loops and surrounding soft tissue edema concerning for incarcerated hernia and resultant small bowel obstruction, he is brought emergently to the operating room where he underwent exploratory laparotomy, ventral hernia repair, and bowel resection. Postoperative course complicated by ongoing hypotension, requiring norepinephrine in spite of volume resuscitation, ongoing ventilator dependence. [facility] to [facility].   Subjective: Feeling well, ate breakfast, food seems to go right through him. No chest pain or dyspnea. Looking into SNF rehab options. Daughter at bedside. Says his urine has been this dark, "like pepsi," since before the surgery. No pneumaturia that he can tell. No new pains.  Objective: BP (!) 112/58   Pulse 72   Temp 98.3 F (36.8 C) (Oral)   Resp 20   Ht 5' 10"$  (1.778 m)   Wt 97.8 kg   SpO2 96%   BMI 30.94 kg/m   Gen: Elderly nontoxic male laying flat Pulm: Clear, nonlabored  CV: NSR on monitor with occ PAC, stable SEM at RUSB, no pitting edema GI: Soft, ND, +BS, appropriately tender Neuro: Alert and oriented. Left hemiparesis. No new focal deficits. Ext: Warm, no deformities Skin: Midline abdominal wound healing. No new rashes, lesions or ulcers on visualized skin   Assessment & Plan: Postoperative shock: Combination of septic and cariogenic with fluid shifts. Resolved.  Acute hypoxic respiratory failure due to perioperative aspiration event: Extubated 2/11.  - Pulmonary hygiene   Strangulated ventral hernia: s/p ex lap with 80cm of necrotic bowel resection 2/9.  - Postoperative  recommendations per general surgery, advancing diet, postop ileus resolved. Wound care orders in place.  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  - Switched to losartan per cardiology, on metoprolol, plan to repeat echo in 6-8 weeks after GDMT. Monitor BP, soft. - Volume status ok  PAF with RVR:  - Cardiology following, has converted to NSR. Plan to continue amiodarone po, metoprolol - Changed heparin to eliquis, ok'ed by surgery.   Normocytic anemia: Hgb 7.7g/dl - Continue monitoring while restarting DOAC. Check anemia panel.  NSTEMI: Demand myocardial ischemia, no signs of ACS currently.  - No plans for LHC at this time. No current chest pain. Continue ASA, BB  Aortic stenosis:  - Cardiology follow up recommended.   Dark urine: No significant dysuria, present PTA. Unclear whether this represents any true hematuria. Pt reports no pneumaturia.  - UA grossly abnormal. Culture sent. This may suggest need for CT, defer to surgery.  History of CVA with left hemiparesis: Increases risk of deconditioning.  - CIR, TOC consulted  OSA:  - CPAP qHS  Hypokalemia:  - Supplement again this AM.  Hypophosphatemia: Resolved with supplementation.  HTN:  - Metoprolol, losartan  Moderate protein calorie malnutrition despite obesity: Inadequate calorie intake to meet catabolic needs.  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  - WOC consulted, local wound care and offloading.  [name], MD [facility] [email or web address] 10/29/2022, 10:17 AM

## 2022-10-29 NOTE — Progress Notes (Signed)
  Inpatient Rehabilitation Admissions Coordinator   Met with patient and grandson, Kathlyn [place], at bedside for rehab assessment. We discussed goals and expectations of a possible CIR admit. I recommend SNF level rehab at this time for feel he is unable to tolerate the intensity required. Kathlyn Sacramento states they are pursuing SNF at the [facility] center. I have alerted TOC RN CM, Deborah. We will not pursue Cir admit. I will sign off.   Danne Baxter, RN, MSN Rehab Admissions Coordinator [phone]

## 2022-10-29 NOTE — Consult Note (Addendum)
[place] Nurse Consult Note: Reason for Consult: Consult requested for sacrum.  Pt has a dark red-purple Deep tissue pressure injury (DTPI) , 5X3X.2cm, which has evolved into a Stage 2 pressure injury in the center, red and moist with loose peeling skin surrounding. Small amt yellow drainage. This was noted as present on admission, according to the bedside nursing wound flow sheet. Assessed wound appearance with daughter at the bedside and discussed that DTPI frequently evolve into full thickness tissue loss within 7-10 days, so it looks different then when he was admitted.  Pressure Injury POA: Yes Dressing procedure/placement/frequency: Topical treatment orders provided for bedside nurses to perform as follows to promote healing: Apply Xeroform gauze to sacrum wound Q day, then cover with foam dressing. (Change foam dressing Q 3 days or PRN soiling.) Please re-consult if further assistance is needed.  Thank-you,  Julien Girt MSN, [place], [place], [place], [place]

## 2022-10-29 NOTE — Progress Notes (Addendum)
Rounding Note    Patient Name: Craig Fields Date of Encounter: 10/29/2022  [place] Cardiologist: Rozann Lesches, MD   Subjective   Patient is resting in bed in no acute distress. Daughter at bedside. He states he is improving. He notes that today is the best that he has felt since he has been here. He has no complaints today.   Inpatient Medications    Scheduled Meds:  (feeding supplement) PROSource Plus  30 mL Oral BID BM   amiodarone  400 mg Oral BID   apixaban  5 mg Oral BID   aspirin EC  81 mg Oral Daily   bisacodyl  10 mg Rectal Daily   Chlorhexidine Gluconate Cloth  6 each Topical Daily   feeding supplement  237 mL Oral TID WC   insulin aspart  0-6 Units Subcutaneous Q4H   losartan  12.5 mg Oral Daily   melatonin  3 mg Oral QHS   metoprolol tartrate  12.5 mg Oral BID   mouth rinse  15 mL Mouth Rinse Q2H   pantoprazole (PROTONIX) IV  40 mg Intravenous Q24H   Continuous Infusions:  sodium chloride     sodium chloride     piperacillin-tazobactam (ZOSYN)  IV 3.375 g (10/29/22 0624)   PRN Meds: Place/Maintain arterial line **AND** sodium chloride, sodium chloride, acetaminophen, calcium carbonate, ipratropium-albuterol, lip balm, [DISCONTINUED] ondansetron **OR** ondansetron (ZOFRAN) IV, mouth rinse   Vital Signs    Vitals:   10/28/22 2017 10/29/22 0011 10/29/22 0337 10/29/22 0625  BP: (!) 103/49 (!) 89/46 (!) 112/47   Pulse:  70 69   Resp: 20 (!) 22 20   Temp: 98 F (36.7 C) 98.5 F (36.9 C) (!) 97.3 F (36.3 C)   TempSrc: Oral Oral Oral   SpO2: 97% 94% 97%   Weight:  96.3 kg  97.8 kg  Height:        Intake/Output Summary (Last 24 hours) at 10/29/2022 0802 Last data filed at 10/29/2022 [id number] Gross per 24 hour  Intake 240 ml  Output 500 ml  Net -260 ml      10/29/2022    6:25 AM 10/29/2022   12:11 AM 10/28/2022    4:00 AM  Last 3 Weights  Weight (lbs) 215 lb 9.8 oz 212 lb 4.9 oz 210 lb 1.6 oz  Weight (kg) 97.8 kg 96.3 kg 95.301 kg       Telemetry     Normal sinus rhythm- Personally Reviewed  ECG    No new ECG  Physical Exam   GEN: No acute distress.   Neck: No JVD Cardiac: Grade II/VI systolic murmur heard best at the apex Respiratory: Clear to auscultation bilaterally. GI: Soft, nontender, non-distended  MS: No edema; No deformity. Neuro:  Nonfocal  Psych: Normal affect   Labs    High Sensitivity Troponin:   Recent Labs  Lab 10/23/22 2232 10/24/22 0206 10/24/22 0536 10/24/22 2331 10/25/22 0154  TROPONINIHS 1,466* 1,659* 1,597* 972* 885*     Chemistry Recent Labs  Lab 10/22/22 1617 10/23/22 0430 10/23/22 1828 10/24/22 0206 10/24/22 1700 10/27/22 0400 10/27/22 1540 10/28/22 0027 10/29/22 0055  NA 139 139  --  138   < > 137 138 138 140  K 4.4 3.8  --  3.0*   < > 2.7* 3.5 3.1* 3.3*  CL 99 105  --  109   < > 97* 102 103 105  CO2 28 24  --  19*   < > 27 26  25 22  GLUCOSE 174* 124*  --  156*   < > 105* 133* 131* 128*  BUN 23 25*  --  24*   < > 26* 26* 24* 27*  CREATININE 1.08 1.08  --  0.90   < > 0.95 0.95 0.90 0.80  CALCIUM 10.4* 8.9  --  8.4*   < > 8.5* 8.6* 8.4* 8.3*  MG  --   --    < > 1.6*   < > 2.0  --  1.8 2.1  PROT 7.7 5.6*  --  5.1*  --   --   --   --   --   ALBUMIN 4.2 2.9*  --  2.3*  --   --   --   --   --   AST 28 33  --  22  --   --   --   --   --   ALT 15 11  --  11  --   --   --   --   --   ALKPHOS 39 26*  --  32*  --   --   --   --   --   BILITOT 1.2 1.0  --  0.7  --   --   --   --   --   GFRNONAA >60 >60  --  >60   < > >60 >60 >60 >60  ANIONGAP 12 10  --  10   < > 13 10 10 13   $ < > = values in this interval not displayed.    Lipids  Recent Labs  Lab 10/23/22 0430  TRIG 56    Hematology Recent Labs  Lab 10/27/22 1920 10/28/22 0027 10/29/22 0055  WBC 10.4 9.5 9.3  RBC 2.76* 2.63* 2.46*  HGB 8.7* 8.5* 7.7*  HCT 26.6* 24.8* 23.8*  MCV 96.4 94.3 96.7  MCH 31.5 32.3 31.3  MCHC 32.7 34.3 32.4  RDW 14.0 14.0 14.1  PLT 181 176 214   Thyroid No results for  input(s): "TSH", "FREET4" in the last 168 hours.  BNPNo results for input(s): "BNP", "PROBNP" in the last 168 hours.  DDimer No results for input(s): "DDIMER" in the last 168 hours.   Radiology    No results found.  Cardiac Studies   Echo reviewed   Patient Profile     82 y.o. male with past medical history of CAD status post CABG in 2004 who presented with abdominal pain, subsequently needed exploratory laparotomy with 80 cm of small bowel removal with new acute heart failure with reduced ejection fraction.    Assessment & Plan    1.  Acute heart failure with reduced ejection fraction: Heart failure likely tachycardia mediated. Did switch patient to losartan yesterday and did introduce metoprolol succinate. Blood pressures did get a little soft, but otherwise patient tolerated well. On exam Patient has known murmur, with no edema noted. As patient blood pressure did get softer after introduction of new medications, will not plan to add any other medications at this time. Will plan to continue with losartan 12.5 mg daily and continue with metoprolol succinate 12.5 mg daily. When blood pressures are able to tolerate, can consider adding SGLT2 and spironolactone. Will need repeat ECHO in about 6-8 weeks.    2.  NSTEMI likely type II: Trops have peaked. No acute concerns at this time about chest pains or ACS.    3.  Atrial fibrillation: Patient remains in normal sinus rhythm upon review  telemetry. Patient has been transitioned to [place] yesterday and patient is tolerating well. Patient continues to be on oral amio as well. No acute concerns at this moment. Will continue with amiodarone 400 mg BID, and apixiban 5 mg BID. Continue tele.   4.  Moderately severe aortic stenosis: No acute concerns at this time.  Likely contributing to acute heart failure.  Will likely need CT angiography and cath in the future if plan is to pursue TAVR if patient decides to go that route.   5. Hypokalemia: K  decreased this AM. Will replete and recheck.    For questions or updates, please contact [place] Please consult [email or web address] for contact info under   Signed, Leigh Aurora, DO  Internal Medicine Resident PGY-1 10/29/2022, 8:02 AM    Patient seen, examined. Available data reviewed. Agree with findings, assessment, and plan as outlined by Dr Posey Pronto.  The patient is alert, oriented, elderly male in no distress.  Lungs are clear bilaterally, heart is regular rate and rhythm with a 2/6 harsh systolic murmur at the left lower sternal border, abdomen is soft and nontender, extremities with 1+ pedal edema but no pretibial edema.  The patient continues to demonstrate signs of slow improvement.  I agree with the medication regimen outlined above.  We will keep him on very low doses of losartan and metoprolol succinate as his blood pressure will likely not tolerate further upward titration of his medical therapy.  He will remain on amiodarone 400 mg twice daily through 2 weeks, then should transition to 200 mg daily.  He will remain on apixaban for oral anticoagulation.  Fortunately he is maintaining sinus rhythm.  Will reassess his candidacy for TAVR once he recovers from his surgery and acute illness.  Of note, I would avoid use of an SGLT2 inhibitor due to concern over his risk for a complicated UTI based on the appearance of his urine.   We will sign off today with the following recommendations: Amiodarone 400 mg twice daily through February 27, then 200 mg daily Continue apixaban 5 milligrams twice daily with caution for signs of bleeding Consolidate beta-blocker to metoprolol succinate 12.5 mg daily Continue losartan 12.5 mg daily Follow-up echocardiogram in 6 to 8 weeks to reassess LV function and aortic stenosis Will arrange clinical follow-up with Dr. Domenic Polite Otherwise management as per primary team please reach out if cardiac concerns  Sherren Mocha, M.D. 10/29/2022 12:07 PM

## 2022-10-29 NOTE — Consult Note (Signed)
   [facility] Inpatient Consult   10/29/2022  Craig Fields 08-17-1941 [id number]  [place] Organization [ACO] Patient: Medicare ACO REACH  Primary Care Provider:  Fayrene Helper, MD   Patient screened for length of stay [post ICU transition] hospitalization with noted medium risk score for unplanned readmission risk and  to assess for potential [place] service needs for post hospital transition for care coordination.  Review of patient's electronic medical record reveals patient is being recommended for an inpatient rehabilitation transition verse a skilled nursing facility for rehab. Patient was discussed in unit progression.   Plan:  Continue to follow progress and disposition to assess for post hospital community care coordination/management needs.  Referral request for community care coordination: depending on disposition which is currently not known  Of note, [place] does not replace or interfere with any arrangements made by the Inpatient Transition of Care team.  For questions contact:   Natividad Brood, RN BSN [place]  [phone] business mobile phone Toll free office [phone]  *[place]  [phone] Fax number: [phone] [email or web address]

## 2022-10-30 DIAGNOSIS — I5020 Unspecified systolic (congestive) heart failure: Secondary | ICD-10-CM | POA: Diagnosis not present

## 2022-10-30 DIAGNOSIS — K460 Unspecified abdominal hernia with obstruction, without gangrene: Secondary | ICD-10-CM | POA: Diagnosis not present

## 2022-10-30 DIAGNOSIS — I4891 Unspecified atrial fibrillation: Secondary | ICD-10-CM | POA: Diagnosis not present

## 2022-10-30 DIAGNOSIS — K436 Other and unspecified ventral hernia with obstruction, without gangrene: Secondary | ICD-10-CM | POA: Diagnosis not present

## 2022-10-30 LAB — CBC
HCT: 25.9 % — ABNORMAL LOW (ref 39.0–52.0)
Hemoglobin: 8.4 g/dL — ABNORMAL LOW (ref 13.0–17.0)
MCH: 31.6 pg (ref 26.0–34.0)
MCHC: 32.4 g/dL (ref 30.0–36.0)
MCV: 97.4 fL (ref 80.0–100.0)
Platelets: 277 10*3/uL (ref 150–400)
RBC: 2.66 MIL/uL — ABNORMAL LOW (ref 4.22–5.81)
RDW: 14.3 % (ref 11.5–15.5)
WBC: 9.6 10*3/uL (ref 4.0–10.5)
nRBC: 0 % (ref 0.0–0.2)

## 2022-10-30 LAB — BASIC METABOLIC PANEL
Anion gap: 6 (ref 5–15)
BUN: 21 mg/dL (ref 8–23)
CO2: 28 mmol/L (ref 22–32)
Calcium: 8.3 mg/dL — ABNORMAL LOW (ref 8.9–10.3)
Chloride: 103 mmol/L (ref 98–111)
Creatinine, Ser: 0.76 mg/dL (ref 0.61–1.24)
GFR, Estimated: >60 mL/min (ref >60)
Glucose, Bld: 133 mg/dL — ABNORMAL HIGH (ref 70–99)
Potassium: 3.2 mmol/L — ABNORMAL LOW (ref 3.5–5.1)
Sodium: 137 mmol/L (ref 135–145)

## 2022-10-30 LAB — IRON AND TIBC
Iron: 22 ug/dL — ABNORMAL LOW (ref 45–182)
Saturation Ratios: 16 % — ABNORMAL LOW (ref 17.9–39.5)
TIBC: 140 ug/dL — ABNORMAL LOW (ref 250–450)
UIBC: 118 ug/dL

## 2022-10-30 LAB — URINE CULTURE: Culture: NO GROWTH

## 2022-10-30 LAB — GLUCOSE, CAPILLARY
Glucose-Capillary: 106 mg/dL — ABNORMAL HIGH (ref 70–99)
Glucose-Capillary: 114 mg/dL — ABNORMAL HIGH (ref 70–99)
Glucose-Capillary: 125 mg/dL — ABNORMAL HIGH (ref 70–99)
Glucose-Capillary: 140 mg/dL — ABNORMAL HIGH (ref 70–99)

## 2022-10-30 LAB — MAGNESIUM: Magnesium: 2 mg/dL (ref 1.7–2.4)

## 2022-10-30 LAB — RETICULOCYTES
Immature Retic Fract: 35.9 % — ABNORMAL HIGH (ref 2.3–15.9)
RBC.: 2.67 MIL/uL — ABNORMAL LOW (ref 4.22–5.81)
Retic Count, Absolute: 45.4 10*3/uL (ref 19.0–186.0)
Retic Ct Pct: 1.7 % (ref 0.4–3.1)

## 2022-10-30 LAB — FOLATE: Folate: 18.3 ng/mL (ref >5.9)

## 2022-10-30 LAB — VITAMIN B12: Vitamin B-12: 613 pg/mL (ref 180–914)

## 2022-10-30 LAB — FERRITIN: Ferritin: 366 ng/mL — ABNORMAL HIGH (ref 24–336)

## 2022-10-30 MED ORDER — TRAZODONE HCL 50 MG PO TABS
50.0000 mg | ORAL_TABLET | Freq: Every evening | ORAL | Status: DC | PRN
  Administered 2022-10-30: 50 mg via ORAL
  Filled 2022-10-30: qty 1

## 2022-10-30 MED ORDER — MELATONIN 5 MG PO TABS
5.0000 mg | ORAL_TABLET | Freq: Every day | ORAL | Status: DC
  Administered 2022-10-31: 5 mg via ORAL
  Filled 2022-10-30: qty 1

## 2022-10-30 MED ORDER — POTASSIUM CHLORIDE CRYS ER 20 MEQ PO TBCR
40.0000 meq | EXTENDED_RELEASE_TABLET | Freq: Once | ORAL | Status: AC
  Administered 2022-10-30: 40 meq via ORAL
  Filled 2022-10-30: qty 2

## 2022-10-30 NOTE — NC FL2 (Signed)
[place] LEVEL OF CARE FORM     IDENTIFICATION  Patient Name: Craig Fields Birthdate: 23-Dec-1940 Sex: male Admission Date (Current Location): 10/22/2022  [facility] and [place] Number:  Herbalist and Address:  [facility] [place] [place] [address], [place], [place] [address]      Provider Number: [id number]  Attending Physician Name and Address:  [name], MD  Relative Name and Phone Number:  Anderson Malta [phone]    Current Level of Care: Hospital Recommended Level of Care: [place] Prior Approval Number:    Date Approved/Denied:   PASRR Number: [id number]  Discharge Plan: SNF    Current Diagnoses: Patient Active Problem List   Diagnosis Date Noted   Atrial fibrillation with rapid ventricular response ([place]) 10/26/2022   HFrEF (heart failure with reduced ejection fraction) ([place]) 10/26/2022   Incarcerated hernia 10/22/2022   Strangulated ventral hernia 10/22/2022   Cystitis 07/02/2022   Skin lesion of face 07/02/2022   Valvular heart disease 05/27/2021   H/O colon cancer, stage I 05/27/2021   Recurrent incisional hernia    Left leg weakness 02/28/2013   Difficulty walking 02/28/2013   Prediabetes 07/19/2011   Back pain 07/26/2010   Hemiplegia, late effect of cerebrovascular disease ([place]) 11/05/2008   Hyperlipidemia LDL goal <70 02/21/2008   Obesity (BMI 30.0-34.9) 02/21/2008   Essential hypertension, benign 02/21/2008   Coronary atherosclerosis of native coronary artery 02/21/2008   SLEEP APNEA 02/21/2008    Orientation RESPIRATION BLADDER Height & Weight     Self, Time, Situation, Place  O2 (2l) Incontinent, External catheter Weight: 215 lb 9.8 oz (97.8 kg) Height:  5' 10"$  (177.8 cm)  BEHAVIORAL SYMPTOMS/MOOD NEUROLOGICAL BOWEL NUTRITION STATUS      Incontinent Diet (See DC summary)  AMBULATORY STATUS COMMUNICATION OF NEEDS Skin   Extensive Assist Verbally PU Stage and Appropriate Care, Other  (Comment), Surgical wounds (Sacrum lower deep tissue pressure injury, incision abdowen closed)                       Personal Care Assistance Level of Assistance  Bathing, Feeding, Dressing Bathing Assistance: Maximum assistance Feeding assistance: Limited assistance Dressing Assistance: Maximum assistance     Functional Limitations Info  Sight, Hearing, Speech Sight Info: Impaired Hearing Info: Adequate Speech Info: Adequate    SPECIAL CARE FACTORS FREQUENCY  PT (By licensed PT), OT (By licensed OT)     PT Frequency: 5x per week OT Frequency: 5x per week            Contractures Contractures Info: Not present    Additional Factors Info  Code Status, Allergies Code Status Info: Full Allergies Info: Bee Venom, Niacin           Current Medications (10/30/2022):  This is the current hospital active medication list Current Facility-Administered Medications  Medication Dose Route Frequency Provider Last Rate Last Admin   (feeding supplement) PROSource Plus liquid 30 mL  30 mL Oral BID BM Candee Furbish, MD   30 mL at 10/30/22 1255   0.9 %  sodium chloride infusion   Intra-arterial PRN Manuella Ghazi, Pratik D, DO       0.9 %  sodium chloride infusion   Intravenous PRN Candee Furbish, MD       acetaminophen (TYLENOL) tablet 650 mg  650 mg Oral Q4H PRN [name], MD   650 mg at 10/30/22 1254   amiodarone (PACERONE) tablet 400  mg  400 mg Oral BID Leigh Aurora, DO   400 mg at 10/30/22 0840   apixaban (ELIQUIS) tablet 5 mg  5 mg Oral BID Sherren Mocha, MD   5 mg at 10/30/22 0840   aspirin EC tablet 81 mg  81 mg Oral Daily Candee Furbish, MD   81 mg at 10/30/22 0840   bisacodyl (DULCOLAX) suppository 10 mg  10 mg Rectal Daily Pappayliou, Catherine A, DO   10 mg at 10/27/22 1024   calcium carbonate (TUMS - dosed in mg elemental calcium) chewable tablet 200 mg of elemental calcium  1 tablet Oral TID PRN Jill Alexanders, PA-C   200 mg of elemental calcium at 10/27/22 1106    Chlorhexidine Gluconate Cloth 2 % PADS 6 each  6 each Topical Daily Caren Griffins, MD   6 each at 10/30/22 0840   feeding supplement (ENSURE ENLIVE / ENSURE PLUS) liquid 237 mL  237 mL Oral TID WC Jill Alexanders, PA-C   237 mL at 10/30/22 1255   insulin aspart (novoLOG) injection 0-6 Units  0-6 Units Subcutaneous TID AC & HS [name], MD   1 Units at 10/29/22 1628   ipratropium-albuterol (DUONEB) 0.5-2.5 (3) MG/3ML nebulizer solution 3 mL  3 mL Nebulization Q6H PRN Zierle-Ghosh, Asia B, DO   3 mL at 10/23/22 0359   lip balm (CARMEX) ointment   Topical PRN Erick Colace, NP       losartan (COZAAR) tablet 12.5 mg  12.5 mg Oral Daily Sherren Mocha, MD   12.5 mg at 10/30/22 [id number]   melatonin tablet 3 mg  3 mg Oral QHS Vance Gather B, MD   3 mg at 10/29/22 2143   metoprolol succinate (TOPROL-XL) 24 hr tablet 12.5 mg  12.5 mg Oral Daily Sherren Mocha, MD   12.5 mg at 10/30/22 0840   ondansetron (ZOFRAN) injection 4 mg  4 mg Intravenous Q6H PRN Caren Griffins, MD       Oral care mouth rinse  15 mL Mouth Rinse PRN Candee Furbish, MD       pantoprazole (PROTONIX) injection 40 mg  40 mg Intravenous Q24H Hayden Pedro M, NP   40 mg at 10/30/22 0840     Discharge Medications: Please see discharge summary for a list of discharge medications.  Relevant Imaging Results:  Relevant Lab Results:   Additional Information SSN# [place], [place]

## 2022-10-30 NOTE — TOC Progression Note (Addendum)
Transition of Care [facility]) - Progression Note    Patient Details  Name: Craig Fields MRN: [id number] Date of Birth: [birth year]  Transition of [facility]) CM/SW [place], LCSW Phone Number: 10/30/2022, 2:05 PM  Clinical Narrative:     Due to CIR not pursuing admission, CSW went to speak with pt in regards to SNF recommendation. Pt informed CSW to speak with pt's daughter Anderson Malta. CSW spoke with pt's daughter Anderson Malta and she was aware and in agreeable with SNF placement. Anderson Malta explained that pt has not been to a SNF before and family would like pt to placed near his home in [place]. CSW obtained permission to send out referrals near pt home in [place]. Anderson Malta explained top facility choice would be [facility]. CSW informed Anderson Malta that a medicare.gov rating list was left in pt's room for her review.  TOC team will continue to assist with discharge planning needs.   Expected Discharge Plan: [place] Barriers to Discharge: Continued Medical Work up  Expected Discharge Plan and Services In-house Referral: Clinical Social Work     Living arrangements for the past 2 months: Single Family Home Expected Discharge Date: 10/30/22                                     Social Determinants of Health (SDOH) Interventions SDOH Screenings   Food Insecurity: No Food Insecurity (10/23/2022)  Housing: Low Risk  (10/23/2022)  Transportation Needs: No Transportation Needs (10/23/2022)  Utilities: Not At Risk (10/23/2022)  Alcohol Screen: Low Risk  (06/28/2022)  Depression (PHQ2-9): Low Risk  (07/02/2022)  Financial Resource Strain: Low Risk  (06/28/2022)  Physical Activity: Inactive (06/28/2022)  Social Connections: Moderately Integrated (06/28/2022)  Stress: No Stress Concern Present (06/28/2022)  Tobacco Use: Medium Risk (10/28/2022)    Readmission Risk Interventions   No data to display

## 2022-10-30 NOTE — Progress Notes (Signed)
[facility] PROGRESS NOTE  Craig Fields (DOB: 30-May-1941) [id number] PCP: Fayrene Helper, MD  Brief Narrative: Craig Fields is an 82 year old male patient with history as mentioned below most significantly known Ventral hernia. Admitted on 2/9 with abdominal pain, nausea and vomiting CT abdomen showed ventral hernia with small bowel loops and surrounding soft tissue edema concerning for incarcerated hernia and resultant small bowel obstruction, he is brought emergently to the operating room where he underwent exploratory laparotomy, ventral hernia repair, and bowel resection. Postoperative course complicated by ongoing hypotension, requiring norepinephrine in spite of volume resuscitation, ongoing ventilator dependence. [facility]red to [facility] for critical care services. With supportive care, antibiotics, rate/rhythm control agents, the patient has stabilized. Surgery and cardiology have been following, and now feel comfortable with the patient discharging for rehabilitation at SNF and following up as outpatient. He is medically stable for discharge.   Subjective: Did not feel he could participate with intensity required at [facility], desires SNF, preferably at [facility]. Still having loose stools, no dysuria or other pain. Abdominal soreness comes and goes and is overall improved. No palpitations, chest pain or dyspnea reported. Feels ready for rehabilitation.   Objective: BP (!) 116/52 (BP Location: Right Arm)   Pulse 70   Temp 99 F (37.2 C) (Oral)   Resp (!) 22   Ht 5' 10"$  (1.778 m)   Wt 97.8 kg   SpO2 99%   BMI 30.94 kg/m   Gen: Elderly pleasant male in no distress Right cheek eschar contracting a bit as anticipated Pulm: Clear, nonlabored  CV: RRR, no pitting edema or JVD GI: Soft, NT, ND, +BS. Midline wound c/d/i Neuro: Alert and oriented. No new focal deficits, stable left hemiparesis. Ext: Warm, no deformities Skin: No other rashes, lesions or ulcers on visualized  skin   Assessment & Plan: Postoperative shock: Combination of septic and cariogenic with fluid shifts. Resolved.  Acute hypoxic respiratory failure due to perioperative aspiration event: Extubated 2/11.  - Pulmonary hygiene   Strangulated ventral hernia: s/p ex lap with 80cm of necrotic bowel resection 2/9.  - Postoperative recommendations per general surgery, advancing diet, postop ileus resolved. Wound care orders in place. No further inpatient management recommended by surgery.  - Completed zosyn 2/16  Acute HFrEF: Likely tachycardia-mediated.  - Switched to losartan per cardiology, on metoprolol, plan to repeat echo in 6-8 weeks after GDMT. Monitor BP, soft. - Volume status ok  PAF with RVR: Converted to NSR.  - Plan to continue amiodarone po, metoprolol - Continue eliquis - No further interventions or treatments recommended by cardiology.   Normocytic anemia: Hgb 8.4g/dl, stable - Continue monitoring while restarting DOAC. Anemia panel consistent with AOCD.   NSTEMI: Demand myocardial ischemia, no signs of ACS currently.  - No plans for LHC at this time. No current chest pain. Continue ASA, BB  Aortic stenosis:  - Cardiology follow up recommended.   Dark urine: No significant dysuria, present PTA. Unclear whether this represents any true hematuria. Pt reports no pneumaturia.  - UA grossly abnormal, no growth on culture x2. - This may suggest need for CT to eval for colovesical fistula, defer to surgery.  History of CVA with left hemiparesis: Increases risk of deconditioning.  - With acute deconditioning, the patient will require SNF rehabilitation. Spoke with CSW who is waiting to hear back.   OSA:  - CPAP qHS  Hypokalemia:  - Supplement again this AM, will need monitoring.  Hypophosphatemia: Resolved with supplementation.  HTN:  -  Well-controlled, continue metoprolol, losartan  Moderate protein calorie malnutrition despite obesity: Inadequate calorie intake to  meet catabolic needs.  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  - WOC consulted, local wound care and offloading.  [name], MD [facility] [email or web address] 10/30/2022, 3:17 PM

## 2022-10-31 DIAGNOSIS — K436 Other and unspecified ventral hernia with obstruction, without gangrene: Secondary | ICD-10-CM | POA: Diagnosis not present

## 2022-10-31 DIAGNOSIS — I4891 Unspecified atrial fibrillation: Secondary | ICD-10-CM | POA: Diagnosis not present

## 2022-10-31 DIAGNOSIS — K460 Unspecified abdominal hernia with obstruction, without gangrene: Secondary | ICD-10-CM | POA: Diagnosis not present

## 2022-10-31 DIAGNOSIS — I5020 Unspecified systolic (congestive) heart failure: Secondary | ICD-10-CM | POA: Diagnosis not present

## 2022-10-31 LAB — CBC
HCT: 25.9 % — ABNORMAL LOW (ref 39.0–52.0)
Hemoglobin: 8.5 g/dL — ABNORMAL LOW (ref 13.0–17.0)
MCH: 32.2 pg (ref 26.0–34.0)
MCHC: 32.8 g/dL (ref 30.0–36.0)
MCV: 98.1 fL (ref 80.0–100.0)
Platelets: 336 10*3/uL (ref 150–400)
RBC: 2.64 MIL/uL — ABNORMAL LOW (ref 4.22–5.81)
RDW: 14.3 % (ref 11.5–15.5)
WBC: 9.3 10*3/uL (ref 4.0–10.5)
nRBC: 0 % (ref 0.0–0.2)

## 2022-10-31 LAB — BASIC METABOLIC PANEL
Anion gap: 6 (ref 5–15)
BUN: 20 mg/dL (ref 8–23)
CO2: 26 mmol/L (ref 22–32)
Calcium: 8.4 mg/dL — ABNORMAL LOW (ref 8.9–10.3)
Chloride: 104 mmol/L (ref 98–111)
Creatinine, Ser: 0.71 mg/dL (ref 0.61–1.24)
GFR, Estimated: >60 mL/min (ref >60)
Glucose, Bld: 138 mg/dL — ABNORMAL HIGH (ref 70–99)
Potassium: 3.5 mmol/L (ref 3.5–5.1)
Sodium: 136 mmol/L (ref 135–145)

## 2022-10-31 LAB — GLUCOSE, CAPILLARY
Glucose-Capillary: 110 mg/dL — ABNORMAL HIGH (ref 70–99)
Glucose-Capillary: 120 mg/dL — ABNORMAL HIGH (ref 70–99)
Glucose-Capillary: 142 mg/dL — ABNORMAL HIGH (ref 70–99)
Glucose-Capillary: 127 mg/dL — ABNORMAL HIGH (ref 70–99)

## 2022-10-31 LAB — MAGNESIUM: Magnesium: 1.9 mg/dL (ref 1.7–2.4)

## 2022-10-31 MED ORDER — SALINE SPRAY 0.65 % NA SOLN
1.0000 | NASAL | Status: DC | PRN
  Administered 2022-10-31: 1 via NASAL
  Filled 2022-10-31: qty 44

## 2022-10-31 MED ORDER — PANTOPRAZOLE SODIUM 40 MG PO TBEC
40.0000 mg | DELAYED_RELEASE_TABLET | Freq: Every day | ORAL | Status: DC
  Administered 2022-11-01: 40 mg via ORAL
  Filled 2022-10-31: qty 1

## 2022-10-31 NOTE — Progress Notes (Signed)
[facility] PROGRESS NOTE  Craig Fields (DOB: 10-Mar-1941) [id number] PCP: Fayrene Helper, MD  Brief Narrative: Craig Fields is an 82 year old male patient with history as mentioned below most significantly known Ventral hernia. Admitted on 2/9 with abdominal pain, nausea and vomiting CT abdomen showed ventral hernia with small bowel loops and surrounding soft tissue edema concerning for incarcerated hernia and resultant small bowel obstruction, he is brought emergently to the operating room where he underwent exploratory laparotomy, ventral hernia repair, and bowel resection. Postoperative course complicated by ongoing hypotension, requiring norepinephrine in spite of volume resuscitation, ongoing ventilator dependence. Transferred to [facility] for critical care services. With supportive care, antibiotics, rate/rhythm control agents, the patient has stabilized. Surgery and cardiology have been following, and now feel comfortable with the patient discharging for rehabilitation at SNF and following up as outpatient. He is medically stable for discharge.   Subjective: Ate better this morning than he has so far. No new pain or other issues. Moving bowels, no abd pain. Slept much better with trazodone.   Objective: BP (!) 123/49 (BP Location: Right Arm)   Pulse 65   Temp (!) 97.5 F (36.4 C) (Oral)   Resp (!) 21   Ht 5' 10"$  (1.778 m)   Wt 101.5 kg   SpO2 99%   BMI 32.11 kg/m   Gen: No distress Eschar stable Pulm: Clear, nonlabored  CV: RRR, no MRG or pitting edema GI: Soft, NT, ND, +BS. Staples in place, wound healing.  Neuro: Alert and oriented. No new focal deficits. Ext: Warm, no deformities Skin: No new rashes, lesions or ulcers on visualized skin   Assessment & Plan: Postoperative shock: Combination of septic and cariogenic with fluid shifts. Resolved.  Acute hypoxic respiratory failure due to perioperative aspiration event: Extubated 2/11.  - Pulmonary hygiene    Strangulated ventral hernia: s/p ex lap with 80cm of necrotic bowel resection 2/9.  - Postoperative recommendations per general surgery, advancing diet, postop ileus resolved. Wound care orders in place. Anticipate staple removal soon. No further inpatient management recommended by surgery.  - Completed zosyn 2/16  Acute HFrEF: Likely tachycardia-mediated.  - Switched to losartan per cardiology, on metoprolol, plan to repeat echo in 6-8 weeks after GDMT. Monitor BP, soft. - Volume status ok  PAF with RVR: Converted to NSR.  - Plan to continue amiodarone po, metoprolol - Continue eliquis - No further interventions or treatments recommended by cardiology.   Normocytic anemia: Hgb 8.4g/dl, stable - Continue monitoring while restarting DOAC. Anemia panel consistent with AOCD.   NSTEMI: Demand myocardial ischemia, no signs of ACS currently.  - No plans for LHC at this time. No current chest pain. Continue ASA, BB  Aortic stenosis:  - Cardiology follow up recommended.   Dark urine: No significant dysuria, present PTA. Unclear whether this represents any true hematuria. Pt reports no pneumaturia.  - UA grossly abnormal, no growth on culture x2. - This may suggest need for CT to eval for colovesical fistula, defer to surgery.  History of CVA with left hemiparesis: Increases risk of deconditioning.  - With acute deconditioning, the patient will require SNF rehabilitation. Spoke with CSW who is waiting to hear back.   OSA:  - CPAP qHS  Hypokalemia:  - Supplemented and improved, will need monitoring.  Hypophosphatemia: Resolved with supplementation.  HTN:  - Well-controlled, continue metoprolol, losartan  Moderate protein calorie malnutrition despite obesity: Inadequate calorie intake to meet catabolic needs.  - Supp protein, RD consult  Insomnia:  -  Continue trazodone qHS  Sacral DTPI POA evolved into stage II pressure injury: POA.  - WOC consulted, local wound care and  offloading.  [name], MD [facility] [email or web address] 10/31/2022, 10:46 AM

## 2022-11-01 DIAGNOSIS — I25810 Atherosclerosis of coronary artery bypass graft(s) without angina pectoris: Secondary | ICD-10-CM | POA: Diagnosis not present

## 2022-11-01 DIAGNOSIS — K9189 Other postprocedural complications and disorders of digestive system: Secondary | ICD-10-CM | POA: Diagnosis not present

## 2022-11-01 DIAGNOSIS — J9601 Acute respiratory failure with hypoxia: Secondary | ICD-10-CM | POA: Diagnosis not present

## 2022-11-01 DIAGNOSIS — I639 Cerebral infarction, unspecified: Secondary | ICD-10-CM | POA: Diagnosis not present

## 2022-11-01 DIAGNOSIS — Z951 Presence of aortocoronary bypass graft: Secondary | ICD-10-CM | POA: Diagnosis not present

## 2022-11-01 DIAGNOSIS — Z8673 Personal history of transient ischemic attack (TIA), and cerebral infarction without residual deficits: Secondary | ICD-10-CM | POA: Diagnosis not present

## 2022-11-01 DIAGNOSIS — K4040 Unilateral inguinal hernia, with gangrene, not specified as recurrent: Secondary | ICD-10-CM | POA: Diagnosis not present

## 2022-11-01 DIAGNOSIS — R600 Localized edema: Secondary | ICD-10-CM | POA: Diagnosis not present

## 2022-11-01 DIAGNOSIS — R4189 Other symptoms and signs involving cognitive functions and awareness: Secondary | ICD-10-CM | POA: Diagnosis not present

## 2022-11-01 DIAGNOSIS — I5023 Acute on chronic systolic (congestive) heart failure: Secondary | ICD-10-CM | POA: Diagnosis not present

## 2022-11-01 DIAGNOSIS — K460 Unspecified abdominal hernia with obstruction, without gangrene: Secondary | ICD-10-CM | POA: Diagnosis not present

## 2022-11-01 DIAGNOSIS — I10 Essential (primary) hypertension: Secondary | ICD-10-CM | POA: Diagnosis not present

## 2022-11-01 DIAGNOSIS — S37012D Minor contusion of left kidney, subsequent encounter: Secondary | ICD-10-CM | POA: Diagnosis not present

## 2022-11-01 DIAGNOSIS — E440 Moderate protein-calorie malnutrition: Secondary | ICD-10-CM | POA: Diagnosis not present

## 2022-11-01 DIAGNOSIS — R1312 Dysphagia, oropharyngeal phase: Secondary | ICD-10-CM | POA: Diagnosis not present

## 2022-11-01 DIAGNOSIS — Z7401 Bed confinement status: Secondary | ICD-10-CM | POA: Diagnosis not present

## 2022-11-01 DIAGNOSIS — D509 Iron deficiency anemia, unspecified: Secondary | ICD-10-CM | POA: Diagnosis not present

## 2022-11-01 DIAGNOSIS — I69354 Hemiplegia and hemiparesis following cerebral infarction affecting left non-dominant side: Secondary | ICD-10-CM | POA: Diagnosis not present

## 2022-11-01 DIAGNOSIS — R29818 Other symptoms and signs involving the nervous system: Secondary | ICD-10-CM | POA: Diagnosis not present

## 2022-11-01 DIAGNOSIS — I110 Hypertensive heart disease with heart failure: Secondary | ICD-10-CM | POA: Diagnosis not present

## 2022-11-01 DIAGNOSIS — R7302 Impaired glucose tolerance (oral): Secondary | ICD-10-CM | POA: Diagnosis not present

## 2022-11-01 DIAGNOSIS — E785 Hyperlipidemia, unspecified: Secondary | ICD-10-CM | POA: Diagnosis not present

## 2022-11-01 DIAGNOSIS — R319 Hematuria, unspecified: Secondary | ICD-10-CM | POA: Diagnosis not present

## 2022-11-01 DIAGNOSIS — Z7901 Long term (current) use of anticoagulants: Secondary | ICD-10-CM | POA: Diagnosis not present

## 2022-11-01 DIAGNOSIS — Z79899 Other long term (current) drug therapy: Secondary | ICD-10-CM | POA: Diagnosis not present

## 2022-11-01 DIAGNOSIS — I5043 Acute on chronic combined systolic (congestive) and diastolic (congestive) heart failure: Secondary | ICD-10-CM | POA: Diagnosis not present

## 2022-11-01 DIAGNOSIS — J9691 Respiratory failure, unspecified with hypoxia: Secondary | ICD-10-CM | POA: Diagnosis not present

## 2022-11-01 DIAGNOSIS — R609 Edema, unspecified: Secondary | ICD-10-CM | POA: Diagnosis not present

## 2022-11-01 DIAGNOSIS — Z825 Family history of asthma and other chronic lower respiratory diseases: Secondary | ICD-10-CM | POA: Diagnosis not present

## 2022-11-01 DIAGNOSIS — R9431 Abnormal electrocardiogram [ECG] [EKG]: Secondary | ICD-10-CM | POA: Diagnosis not present

## 2022-11-01 DIAGNOSIS — K436 Other and unspecified ventral hernia with obstruction, without gangrene: Secondary | ICD-10-CM | POA: Diagnosis not present

## 2022-11-01 DIAGNOSIS — E46 Unspecified protein-calorie malnutrition: Secondary | ICD-10-CM | POA: Diagnosis not present

## 2022-11-01 DIAGNOSIS — I7121 Aneurysm of the ascending aorta, without rupture: Secondary | ICD-10-CM | POA: Diagnosis not present

## 2022-11-01 DIAGNOSIS — Z48815 Encounter for surgical aftercare following surgery on the digestive system: Secondary | ICD-10-CM | POA: Diagnosis not present

## 2022-11-01 DIAGNOSIS — R2681 Unsteadiness on feet: Secondary | ICD-10-CM | POA: Diagnosis not present

## 2022-11-01 DIAGNOSIS — R488 Other symbolic dysfunctions: Secondary | ICD-10-CM | POA: Diagnosis not present

## 2022-11-01 DIAGNOSIS — R911 Solitary pulmonary nodule: Secondary | ICD-10-CM | POA: Diagnosis not present

## 2022-11-01 DIAGNOSIS — I5021 Acute systolic (congestive) heart failure: Secondary | ICD-10-CM | POA: Diagnosis not present

## 2022-11-01 DIAGNOSIS — R531 Weakness: Secondary | ICD-10-CM | POA: Diagnosis not present

## 2022-11-01 DIAGNOSIS — D62 Acute posthemorrhagic anemia: Secondary | ICD-10-CM | POA: Diagnosis not present

## 2022-11-01 DIAGNOSIS — I4719 Other supraventricular tachycardia: Secondary | ICD-10-CM | POA: Diagnosis not present

## 2022-11-01 DIAGNOSIS — E782 Mixed hyperlipidemia: Secondary | ICD-10-CM | POA: Diagnosis not present

## 2022-11-01 DIAGNOSIS — E8809 Other disorders of plasma-protein metabolism, not elsewhere classified: Secondary | ICD-10-CM | POA: Diagnosis not present

## 2022-11-01 DIAGNOSIS — I214 Non-ST elevation (NSTEMI) myocardial infarction: Secondary | ICD-10-CM | POA: Diagnosis not present

## 2022-11-01 DIAGNOSIS — I472 Ventricular tachycardia, unspecified: Secondary | ICD-10-CM | POA: Diagnosis not present

## 2022-11-01 DIAGNOSIS — I2510 Atherosclerotic heart disease of native coronary artery without angina pectoris: Secondary | ICD-10-CM | POA: Diagnosis not present

## 2022-11-01 DIAGNOSIS — G4733 Obstructive sleep apnea (adult) (pediatric): Secondary | ICD-10-CM | POA: Diagnosis not present

## 2022-11-01 DIAGNOSIS — M6281 Muscle weakness (generalized): Secondary | ICD-10-CM | POA: Diagnosis not present

## 2022-11-01 DIAGNOSIS — I352 Nonrheumatic aortic (valve) stenosis with insufficiency: Secondary | ICD-10-CM | POA: Diagnosis not present

## 2022-11-01 DIAGNOSIS — Z1152 Encounter for screening for COVID-19: Secondary | ICD-10-CM | POA: Diagnosis not present

## 2022-11-01 DIAGNOSIS — R011 Cardiac murmur, unspecified: Secondary | ICD-10-CM | POA: Diagnosis not present

## 2022-11-01 DIAGNOSIS — I700 Atherosclerosis of aorta: Secondary | ICD-10-CM | POA: Diagnosis not present

## 2022-11-01 DIAGNOSIS — I25700 Atherosclerosis of coronary artery bypass graft(s), unspecified, with unstable angina pectoris: Secondary | ICD-10-CM | POA: Diagnosis not present

## 2022-11-01 DIAGNOSIS — E6609 Other obesity due to excess calories: Secondary | ICD-10-CM | POA: Diagnosis not present

## 2022-11-01 DIAGNOSIS — R279 Unspecified lack of coordination: Secondary | ICD-10-CM | POA: Diagnosis not present

## 2022-11-01 DIAGNOSIS — M25842 Other specified joint disorders, left hand: Secondary | ICD-10-CM | POA: Diagnosis not present

## 2022-11-01 DIAGNOSIS — I69954 Hemiplegia and hemiparesis following unspecified cerebrovascular disease affecting left non-dominant side: Secondary | ICD-10-CM | POA: Diagnosis not present

## 2022-11-01 DIAGNOSIS — I4891 Unspecified atrial fibrillation: Secondary | ICD-10-CM | POA: Diagnosis not present

## 2022-11-01 DIAGNOSIS — I959 Hypotension, unspecified: Secondary | ICD-10-CM | POA: Diagnosis not present

## 2022-11-01 DIAGNOSIS — R7303 Prediabetes: Secondary | ICD-10-CM | POA: Diagnosis not present

## 2022-11-01 DIAGNOSIS — I480 Paroxysmal atrial fibrillation: Secondary | ICD-10-CM | POA: Diagnosis not present

## 2022-11-01 DIAGNOSIS — I350 Nonrheumatic aortic (valve) stenosis: Secondary | ICD-10-CM | POA: Diagnosis not present

## 2022-11-01 DIAGNOSIS — Z8249 Family history of ischemic heart disease and other diseases of the circulatory system: Secondary | ICD-10-CM | POA: Diagnosis not present

## 2022-11-01 DIAGNOSIS — R310 Gross hematuria: Secondary | ICD-10-CM | POA: Diagnosis not present

## 2022-11-01 DIAGNOSIS — D531 Other megaloblastic anemias, not elsewhere classified: Secondary | ICD-10-CM | POA: Diagnosis not present

## 2022-11-01 DIAGNOSIS — E669 Obesity, unspecified: Secondary | ICD-10-CM | POA: Diagnosis not present

## 2022-11-01 DIAGNOSIS — E876 Hypokalemia: Secondary | ICD-10-CM | POA: Diagnosis not present

## 2022-11-01 DIAGNOSIS — M7989 Other specified soft tissue disorders: Secondary | ICD-10-CM | POA: Diagnosis not present

## 2022-11-01 DIAGNOSIS — I429 Cardiomyopathy, unspecified: Secondary | ICD-10-CM | POA: Diagnosis not present

## 2022-11-01 LAB — GLUCOSE, CAPILLARY
Glucose-Capillary: 112 mg/dL — ABNORMAL HIGH (ref 70–99)
Glucose-Capillary: 91 mg/dL (ref 70–99)

## 2022-11-01 MED ORDER — METOPROLOL SUCCINATE ER 25 MG PO TB24: 12.5000 mg | ORAL_TABLET | Freq: Every day | ORAL | Status: DC

## 2022-11-01 MED ORDER — ACETAMINOPHEN 325 MG PO TABS: 650.0000 mg | ORAL_TABLET | ORAL | Status: DC | PRN

## 2022-11-01 MED ORDER — TRAZODONE HCL 50 MG PO TABS: 50.0000 mg | ORAL_TABLET | Freq: Every evening | ORAL | Status: DC | PRN

## 2022-11-01 MED ORDER — LOSARTAN POTASSIUM 25 MG PO TABS: 12.5000 mg | ORAL_TABLET | Freq: Every day | ORAL | Status: DC

## 2022-11-01 MED ORDER — APIXABAN 5 MG PO TABS: 5.0000 mg | ORAL_TABLET | Freq: Two times a day (BID) | ORAL | Status: DC

## 2022-11-01 MED ORDER — AMIODARONE HCL 200 MG PO TABS: ORAL_TABLET | ORAL | 0 refills | Status: DC

## 2022-11-01 NOTE — TOC Transition Note (Signed)
Transition of Care [facility]) - CM/SW Discharge Note   Patient Details  Name: Craig Fields MRN: [id number] Date of Birth: 10-18-1940  Transition of Care [facility]) CM/SW Contact:  Bjorn Pippin, LCSW Phone Number: 11/01/2022, 2:43 PM   Clinical Narrative:    Patient will DC to: [facility] Anticipated DC date: 11/01/2022 Family notified: Daughter, Anderson Malta Transport by: Corey Harold   Per MD patient ready for DC to [facility]. RN, patient, patient's family, and facility notified of DC. Discharge Summary and FL2 sent to facility. RN to call report prior to discharge [phone]. DC packet on chart. Ambulance transport requested for patient.   CSW will sign off for now as social work intervention is no longer needed. Please consult [place] again if new needs arise.   Final next level of care: Skilled Nursing Facility Barriers to Discharge: No Barriers Identified   Patient Goals and CMS Choice CMS Medicare.gov Compare Post Acute Care list provided to:: Patient Choice offered to / list presented to : Patient, Spouse, Adult Children  Discharge Placement                Patient [facility] bed at: [facility] Patient to be transferred to facility by: [facility] Name of family member notified: Daughter Anderson Malta Patient and family notified of of transfer: 11/01/22  Discharge Plan and Services Additional resources added to the After Visit Summary for   In-house Referral: Clinical Social Work                                   Social Determinants of Health ([place]) Interventions SDOH Screenings   Food Insecurity: No Food Insecurity (10/23/2022)  Housing: Low Risk  (10/23/2022)  Transportation Needs: No Transportation Needs (10/23/2022)  Utilities: Not At Risk (10/23/2022)  Alcohol Screen: Low Risk  (06/28/2022)  Depression (PHQ2-9): Low Risk  (07/02/2022)  Financial Resource Strain: Low Risk  (06/28/2022)  Physical Activity: Inactive (06/28/2022)  Social Connections: Moderately Integrated  (06/28/2022)  Stress: No Stress Concern Present (06/28/2022)  Tobacco Use: Medium Risk (10/28/2022)     Readmission Risk Interventions     No data to display           Beckey Rutter, MSW, LCSWA, LCASA Transitions of Care  Clinical Social Worker I

## 2022-11-01 NOTE — Progress Notes (Addendum)
Physical Therapy Treatment Patient Details Name: Craig Fields MRN: [id number] DOB: 12/15/1940 Today's Date: 11/01/2022   History of Present Illness 82 yo admitted 2/9 with incarcerated hernia causing SBO s/p ex lap same date at [facility]. Transferred to [facility] 2/10 with septic shock and Afib. Extubated 2/11. PMhx: HLD, obesity, HTN, CVA with hemiplegia, colon CA, sleep apnea    PT Comments    Pt admitted with above diagnosis. Pt was able to sit EOB x 15 min with min guard to mod assist working on anterior lean and trunk stability. Pt fatigues quickly and needs assist and cues constantly. 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  Discussed with daughter to let SNF know of vestibular issue since pt is leaving today. Will continue to progress pt as able.  Pt currently with functional limitations due to balance and endurance deficits. Pt will benefit from skilled PT to increase their independence and safety with mobility to allow discharge to the venue listed below.      Recommendations for follow up therapy are one component of a multi-disciplinary discharge planning process, led by the attending physician.  Recommendations may be updated based on patient status, additional functional criteria and insurance authorization.  Follow Up Recommendations  Skilled nursing-short term rehab (<3 hours/day) Can patient physically be transported by private vehicle: No   Assistance Recommended at Discharge Frequent or constant Supervision/Assistance  Patient can return home with the following Two people to help with walking and/or transfers;Two people to help with bathing/dressing/bathroom;Assistance with cooking/housework;Assistance with feeding;Direct supervision/assist for medications management;Assist for transportation   Equipment Recommendations  Hospital bed;Other (comment) (hoyer)    Recommendations for Other Services       Precautions / Restrictions  Precautions Precautions: Fall Precaution Comments: 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 Daughter requested pt get up from left side and pt with difficulty using his right UE this direction    Transfers                        Ambulation/Gait                   Stairs             Wheelchair Mobility    Modified Rankin (Stroke Patients Only)       Balance Overall balance assessment: Needs assistance Sitting-balance support: Single extremity supported Sitting balance-Leahy Scale: Poor Sitting balance - Comments: pt sat EOB ~ 15 mins with an emphasis on  anterior weight shifting to facilitate improved trunk control for and BLE therex, pt with moments of CGA but ultimately required MODA due to posterior lean.  Used a surface in front of pt to reach right UE to to faciliatate anterior lean.  Pt could hold self for up to 20 seconds at a time with right UE support anterior to body. Cues to lean forward and try and activate abdominals. Postural control: Posterior lean, Right lateral lean  Cognition Arousal/Alertness: Awake/alert Behavior During Therapy: WFL for tasks assessed/performed Overall Cognitive Status: Impaired/Different from baseline Area of Impairment: Memory, Problem solving                 Orientation Level: Time   Memory: Decreased short-term memory (repeated same stories) Following Commands: Follows one step  commands consistently, Follows one step commands with increased time     Problem Solving: Slow processing, Requires verbal cues General Comments: noted memory deficits, likely baseline as family didnt seem to be alarmed, overall slow to process but appropriate and following commands        Exercises General Exercises - Lower Extremity Ankle Circles/Pumps: AROM, Right, 5 reps, Supine Heel Slides: AAROM, Both, 10 reps, Supine    General Comments General comments (skin integrity, edema, etc.): VSS.      Pertinent Vitals/Pain Pain Assessment Pain Assessment: No/denies pain    Home Living                          Prior Function            PT Goals (current goals can now be found in the care plan section) Acute Rehab PT Goals Patient Stated Goal: return home Progress towards PT goals: Progressing toward goals    Frequency    Min 2X/week      PT Plan Discharge plan needs to be updated    Co-evaluation              AM-PAC PT "6 Clicks" Mobility   Outcome Measure  Help needed turning from your back to your side while in a flat bed without using bedrails?: Total Help needed moving from lying on your back to sitting on the side of a flat bed without using bedrails?: Total Help needed moving to and from a bed to a chair (including a wheelchair)?: Total Help needed standing up from a chair using your arms (e.g., wheelchair or bedside chair)?: Total Help needed to walk in hospital room?: Total Help needed climbing 3-5 steps with a railing? : Total 6 Click Score: 6    End of Session Equipment Utilized During Treatment: Other (comment) (bil foot orthotics) Activity Tolerance: Patient tolerated treatment well Patient left: in bed;with call bell/phone within reach;with family/visitor present;with bed alarm set Nurse Communication: Mobility status;Need for lift equipment PT Visit Diagnosis: Other abnormalities of gait and mobility (R26.89);Muscle weakness  (generalized) (M62.81);Difficulty in walking, not elsewhere classified (R26.2)     Time: [id number] PT Time Calculation (min) (ACUTE ONLY): 32 min  Charges:  $Therapeutic Activity: 23-37 mins                     Elner Seifert M,PT Acute Rehab Services [phone]    Alvira Philips 11/01/2022, 3:56 PM

## 2022-11-01 NOTE — Consult Note (Signed)
   [facility] Inpatient Consult   11/01/2022  Craig Fields May 17, 1941 [id number]  [place] [facility] [ACO] Patient: Medicare ACO REACH   Follow up: Transition change to SNF  Met with the patient and daughter at the bedside regarding post hospital follow up with community. Daughter and patient states he is to go to [facility] in [place].  Daughter and patient states wife is the best contact for community care coordination needs.  Gave daughter an appointment follow up reminder card and a 24 hour nurse advise line magnet.  Plan: Alert [facility] RN of transitioning to a [facility] affiliated facility.  For questions, please contact:  Natividad Brood, RN BSN [place]  [phone] business mobile phone Toll free office [phone]  *Page  [phone] Fax number: [phone] [email or web address]

## 2022-11-01 NOTE — Discharge Summary (Signed)
Physician Discharge Summary   Patient: Craig Fields MRN: [id number] DOB: [birth year]  Admit date:     10/22/2022  Discharge date: 11/01/22  Discharge Physician: Craig Fields   PCP: Craig Helper, MD   Recommendations at discharge:  Follow up with surgery, Craig Fields after discharge. Recommend removing staples 2/23. Consider CT with rectal contrast after discharge. Follow up with cardiology after discharge for CHF, AFib, aortic stenosis. Plan to repeat echo after 6-8 weeks on GDMT.  Discharge Diagnoses: Principal Problem:   Incarcerated hernia Active Problems:   Strangulated ventral hernia   Atrial fibrillation with rapid ventricular response (HCC)   HFrEF (heart failure with reduced ejection fraction) [facility])  Hospital Course: Craig Fields is an 82 year old male patient with history as mentioned below most significantly known Ventral hernia. Admitted on 2/9 with abdominal pain, nausea and vomiting CT abdomen showed ventral hernia with small bowel loops and surrounding soft tissue edema concerning for incarcerated hernia and resultant small bowel obstruction, he is brought emergently to the operating room where he underwent exploratory laparotomy, ventral hernia repair, and bowel resection. Postoperative course complicated by ongoing hypotension, requiring norepinephrine in spite of volume resuscitation, ongoing ventilator dependence. Transferred to Craig Fields for critical care services. With supportive care, antibiotics, rate/rhythm control agents, the patient has stabilized. Surgery and cardiology have been following, and now feel comfortable with the patient discharging for rehabilitation at SNF and following up as outpatient. He is medically stable for discharge.   Assessment and Plan: Postoperative shock: Combination of septic and cariogenic with fluid shifts. Resolved.   Acute hypoxic respiratory failure due to perioperative aspiration event: Extubated 2/11.  - Pulmonary  hygiene    Strangulated ventral hernia: s/p ex lap with 80cm of necrotic bowel resection 2/9.  - Tolerating advanced diet with stable improved abdominal exam, postop ileus resolved. No further inpatient management recommended by surgery, ok to DC.  - Completed zosyn 2/16   Acute HFrEF: Likely tachycardia-mediated.  - Switched to losartan per cardiology, on metoprolol, plan to repeat echo in 6-8 weeks after GDMT. Monitor BP, soft. - Volume status ok   PAF with RVR: Converted to NSR.  - Plan to continue amiodarone po, taper per cardiology: 460m po BID through 2/27. Starting 2/28, take 2043mpo daily. - Continue metoprolol - Continue eliquis - No further interventions or treatments recommended by cardiology.    Normocytic anemia: Hgb 8.4g/dl, stable - Continue monitoring while restarting DOAC. Anemia panel consistent with AOCD.    NSTEMI: Demand myocardial ischemia, no signs of ACS currently.  - No plans for LHC at this time. No current chest pain. Continue ASA, BB, statin. Plavix replaced by DOAC.   Aortic stenosis:  - Cardiology follow up recommended.    Dark urine: No significant dysuria, present PTA. Unclear whether this represents any true hematuria. Pt reports no pneumaturia.  - UA grossly abnormal, no growth on culture x2. - This may suggest need for CT to eval for colovesical fistula, defer to outpatient setting.   History of CVA with left hemiparesis: Increases risk of deconditioning.  - With acute deconditioning, the patient will require SNF rehabilitation.     OSA:  - CPAP qHS   Hypokalemia:  - Supplemented and improved, will need monitoring.   Hypophosphatemia: Resolved with supplementation.   HTN:  - Well-controlled, continue metoprolol, losartan   Moderate protein calorie malnutrition despite obesity: Inadequate calorie intake to meet catabolic needs.  - Supp protein, RD consult   Insomnia:  -  Continue trazodone qHS   Sacral DTPI POA evolved into stage II  pressure injury: POA.  - WOC consulted, local wound care and offloading.  Consultants: Cardiology, surgery, PCCM Procedures performed: 2/10 CVC, A line; ETT. Ex lap 2/9 Craig Fields Disposition: Skilled nursing facility Diet recommendation: Soft DISCHARGE MEDICATION: Allergies as of 11/01/2022       Reactions   Bee Venom Swelling   Niacin Other (See Comments)   Muscle aches        Medication List     STOP taking these medications    acetaminophen 650 MG CR tablet Commonly known as: TYLENOL Replaced by: acetaminophen 325 MG tablet   clopidogrel 75 MG tablet Commonly known as: PLAVIX   Klor-Con M20 20 MEQ tablet Generic drug: potassium chloride SA   lisinopril 10 MG tablet Commonly known as: ZESTRIL   metoprolol tartrate 50 MG tablet Commonly known as: LOPRESSOR   oxyCODONE-acetaminophen 5-325 MG tablet Commonly known as: PERCOCET/ROXICET   triamterene-hydrochlorothiazide 37.5-25 MG tablet Commonly known as: MAXZIDE-25       TAKE these medications    acetaminophen 325 MG tablet Commonly known as: TYLENOL Take 2 tablets (650 mg total) by mouth every 4 (four) hours as needed for fever, mild pain, moderate pain or headache. Replaces: acetaminophen 650 MG CR tablet   amiodarone 200 MG tablet Commonly known as: PACERONE Take 2 tablets (400 mg) twice daily until 2/27. Starting 2/28, take one tablet (200 mg) daily   apixaban 5 MG Tabs tablet Commonly known as: ELIQUIS Take 1 tablet (5 mg total) by mouth 2 (two) times daily.   aspirin EC 81 MG tablet Take 81 mg by mouth every evening.   Co Q-10 200 MG Caps Take 1 tablet by mouth daily.   [employer] Spectravite Tabs Take 1 tablet by mouth every evening.   losartan 25 MG tablet Commonly known as: COZAAR Take 0.5 tablets (12.5 mg total) by mouth daily. Start taking on: November 02, 2022   metoprolol succinate 25 MG 24 hr tablet Commonly known as: TOPROL-XL Take 0.5 tablets (12.5 mg total) by mouth  daily. Start taking on: November 02, 2022   polyethylene glycol powder 17 GM/SCOOP powder Commonly known as: GLYCOLAX/MIRALAX MIX TO TAKE 17 G DAILY AS DIRECTED What changed: See the new instructions.   simvastatin 20 MG tablet Commonly known as: ZOCOR TAKE 1 TABLET BY MOUTH EVERYDAY AT BEDTIME What changed: See the new instructions.   traZODone 50 MG tablet Commonly known as: DESYREL Take 1 tablet (50 mg total) by mouth at bedtime as needed for sleep.   UNABLE TO FIND Commode assist chair   UNABLE TO FIND Bilateral new shoes  Left AFO with calliber plate  Dx [id number]   UNABLE TO FIND Right custom torch walker x 1  Dx [id number]   Vitamin D 50 MCG (2000 UT) Caps Take 1 capsule by mouth 2 (two) times daily.        Follow-up Information     Pappayliou, Flint Melter, DO. Schedule an appointment as soon as possible for a visit.   Specialty: General Surgery Why: For post-operative follow up in 2-4 weeks Contact information: [address] [facility] [address] [phone]         Satira Sark, MD Follow up.   Specialty: Cardiology Contact information: [place] [address] [phone]         Craig Helper, MD .   Specialty: Family Medicine Contact information: [address] [place]  [place] [address] [phone]                Discharge Exam: Filed Weights   10/30/22 0048 10/31/22 0633 11/01/22 0420  Weight: 97.8 kg 101.5 kg 94.3 kg  BP (!) 117/39 (BP Location: Right Arm)   Pulse 69   Temp 98 F (36.7 C) (Oral)   Resp (!) 22   Ht 5' 10"$  (1.778 m)   Wt 94.3 kg   SpO2 99%   BMI 29.83 kg/m   Pleasant elderly male in no distress Clear and nonlabored RRR, no JVD laying flat Abd soft, minimally tender, ND +BS Dark brown/yellow urine in canister Eschar on right cheek is healing/contracting, no surrounding erythema. Midline abdominal wound with well-apposed wound edges without erythema, discharge or  bleeding.   Condition at discharge: stable  The results of significant diagnostics from this hospitalization (including imaging, microbiology, ancillary and laboratory) are listed below for reference.   Imaging Studies: ECHOCARDIOGRAM COMPLETE  Result Date: 10/25/2022    ECHOCARDIOGRAM REPORT   Patient Name:   DELMON KONDO Viney Date of Exam: 10/24/2022 Medical Rec #:  [id number]     Height:       70.0 in Accession #:    [id number]    Weight:       195.8 lb Date of Birth:  1940-12-02     BSA:          2.068 m Patient Age:    54 years      BP:           112/49 mmHg Patient Gender: M             HR:           61 bpm. Exam Location:  Inpatient Procedure: 2D Echo, Cardiac Doppler, Color Doppler and Intracardiac            Opacification Agent Indications:     [facility]) [id number]  History:         Patient has prior history of Echocardiogram examinations, most                  recent 09/11/2021. CAD, Prior CABG, Stroke,                  Signs/Symptoms:Hypotension; Risk Factors:Dyslipidemia and                  Hypertension.  Sonographer:     Greer Pickerel Referring Phys:  [place] Diagnosing Phys: Cherlynn Kaiser MD  Sonographer Comments: Image acquisition challenging due to patient body habitus and Image acquisition challenging due to respiratory motion. IMPRESSIONS  1. Left ventricular ejection fraction, by estimation, is 20 to 25%. The left ventricle has severely decreased function. The left ventricle demonstrates regional wall motion abnormalities (see scoring diagram/findings for description). Left ventricular diastolic parameters are consistent with Grade II diastolic dysfunction (pseudonormalization).  2. Right ventricular systolic function is mildly reduced. The right ventricular size is normal. There is mildly elevated pulmonary artery systolic pressure. The estimated right ventricular systolic pressure is 123XX123 mmHg.  3. Left atrial size was severely dilated.  4. Right atrial size was mildly  dilated.  5. The mitral valve is degenerative. Mild mitral valve regurgitation. No evidence of mitral stenosis.  6. The aortic valve is abnormal. There is severe calcifcation of the aortic valve. Aortic valve regurgitation is moderate. Moderate aortic valve stenosis. Aortic valve area, by VTI measures 0.63 cm. Aortic valve mean gradient measures 27.0 mmHg.  Aortic  valve Vmax measures 3.40 m/s. DI 0.20, SVI 24 ml/m2, possible low flow low gradient AS.  7. The inferior vena cava is dilated in size with <50% respiratory variability, suggesting right atrial pressure of 15 mmHg. FINDINGS  Left Ventricle: Left ventricular ejection fraction, by estimation, is 20 to 25%. The left ventricle has severely decreased function. The left ventricle demonstrates regional wall motion abnormalities. Definity contrast agent was given IV to delineate the left ventricular endocardial borders. The left ventricular internal cavity size was normal in size. There is no left ventricular hypertrophy. Left ventricular diastolic parameters are consistent with Grade II diastolic dysfunction (pseudonormalization).  LV Wall Scoring: The anterior septum, mid inferoseptal segment, and basal inferoseptal segment are akinetic. The entire anterior wall, entire lateral wall, entire inferior wall, and apical septal segment are hypokinetic. Right Ventricle: The right ventricular size is normal. No increase in right ventricular wall thickness. Right ventricular systolic function is mildly reduced. There is mildly elevated pulmonary artery systolic pressure. The tricuspid regurgitant velocity  is 2.45 m/s, and with an assumed right atrial pressure of 15 mmHg, the estimated right ventricular systolic pressure is 123XX123 mmHg. Left Atrium: Left atrial size was severely dilated. Right Atrium: Right atrial size was mildly dilated. Pericardium: Trivial pericardial effusion is present. Mitral Valve: The mitral valve is degenerative in appearance. Mild to moderate  mitral annular calcification. Mild mitral valve regurgitation. No evidence of mitral valve stenosis. Tricuspid Valve: The tricuspid valve is normal in structure. Tricuspid valve regurgitation is mild . No evidence of tricuspid stenosis. Aortic Valve: The aortic valve is abnormal. There is severe calcifcation of the aortic valve. Aortic valve regurgitation is moderate. Aortic regurgitation PHT measures 357 msec. Moderate aortic stenosis is present. Aortic valve mean gradient measures 27.0 mmHg. Aortic valve peak gradient measures 46.1 mmHg. Aortic valve area, by VTI measures 0.63 cm. Pulmonic Valve: The pulmonic valve was normal in structure. Pulmonic valve regurgitation is mild. No evidence of pulmonic stenosis. Aorta: The aortic root is normal in size and structure. Ascending aorta measurements are within normal limits for age when indexed to body surface area. Venous: The inferior vena cava is dilated in size with less than 50% respiratory variability, suggesting right atrial pressure of 15 mmHg. IAS/Shunts: No atrial level shunt detected by color flow Doppler.  LEFT VENTRICLE PLAX 2D LVIDd:         6.30 cm   Diastology LVIDs:         5.90 cm   LV e' medial:    4.22 cm/s LV PW:         1.00 cm   LV E/e' medial:  34.8 LV IVS:        0.90 cm   LV e' lateral:   7.54 cm/s LVOT diam:     2.00 cm   LV E/e' lateral: 19.5 LV SV:         50 LV SV Index:   24 LVOT Area:     3.14 cm  RIGHT VENTRICLE RV S prime:     6.49 cm/s TAPSE (M-mode): 1.2 cm LEFT ATRIUM              Index        RIGHT ATRIUM           Index LA diam:        4.60 cm  2.22 cm/m   RA Area:     26.70 cm LA Vol (A2C):   90.3 ml  43.66 ml/m  RA Volume:   75.90 ml  36.69 ml/m LA Vol (A4C):   140.0 ml 67.68 ml/m LA Biplane Vol: 115.0 ml 55.60 ml/m  AORTIC VALVE                     PULMONIC VALVE AV Area (Vmax):    0.70 cm      PR End Diast Vel: 6.50 msec AV Area (Vmean):   0.65 cm AV Area (VTI):     0.63 cm AV Vmax:           339.50 cm/s AV Vmean:           240.000 cm/s AV VTI:            0.799 m AV Peak Grad:      46.1 mmHg AV Mean Grad:      27.0 mmHg LVOT Vmax:         76.00 cm/s LVOT Vmean:        49.800 cm/s LVOT VTI:          0.159 m LVOT/AV VTI ratio: 0.20 AI PHT:            357 msec  AORTA Ao Root diam: 3.90 cm Ao Asc diam:  3.90 cm MITRAL VALVE                TRICUSPID VALVE MV Area (PHT): 4.06 cm     TV Peak grad:   34.1 mmHg MV Decel Time: 187 msec     TV Vmax:        2.92 m/s MV E velocity: 147.00 cm/s  TR Peak grad:   24.0 mmHg MV A velocity: 75.10 cm/s   TR Vmax:        245.00 cm/s MV E/A ratio:  1.96                             SHUNTS                             Systemic VTI:  0.16 m                             Systemic Diam: 2.00 cm Cherlynn Kaiser MD Electronically signed by Cherlynn Kaiser MD Signature Date/Time: 10/25/2022/10:22:02 AM    Final (Updated)    DG CHEST PORT 1 VIEW  Result Date: 10/24/2022 CLINICAL DATA:  Ventilator dependence. EXAM: PORTABLE CHEST 1 VIEW COMPARISON:  10/23/2022 FINDINGS: Endotracheal tube tip is 5.3 cm above the base of the carina. NG tube visualized although course through the lower chest is not CRNA April Manson due to technical factors. Right IJ central line tip overlies the mid SVC level. The cardio pericardial silhouette is enlarged. Bibasilar airspace disease evident with some airspace opacity noted left mid lung. Telemetry leads overlie the chest. IMPRESSION: 1. Endotracheal tube tip is 5.3 cm above the base of the carina. 2. Bibasilar and left mid lung airspace disease. Airspace opacity in the left parahilar mid lung appears progressive in the interval. Electronically Signed   By: Misty Stanley M.D.   On: 10/24/2022 07:53   DG CHEST PORT 1 VIEW  Result Date: 10/23/2022 CLINICAL DATA:  Central line placement.  Endotracheal tube present. EXAM: PORTABLE CHEST 1 VIEW COMPARISON:  Prior today FINDINGS: New right jugular central venous catheter seen with tip overlying the superior cavoatrial  junction. No  evidence of pneumothorax. Endotracheal tube and gastric tube remain in place. Stable cardiomegaly. Stable interstitial infiltrates, consistent with interstitial edema. No evidence of pulmonary consolidation or pleural effusion. Prior CABG again noted. IMPRESSION: New right jugular central venous catheter in appropriate position. No evidence of pneumothorax. Stable cardiomegaly and mild interstitial edema. Electronically Signed   By: Marlaine Hind M.D.   On: 10/23/2022 11:15   DG CHEST PORT 1 VIEW  Result Date: 10/23/2022 CLINICAL DATA:  Intubated EXAM: PORTABLE CHEST 1 VIEW COMPARISON:  10/22/2022 FINDINGS: Endotracheal tube with the tip 5.6 cm above the carina. Nasogastric tube with the tip projecting over the stomach. Bilateral diffuse interstitial thickening. Trace bilateral pleural effusions. No pneumothorax. No focal consolidation. Stable cardiomegaly. Prior CABG. No acute osseous abnormality. IMPRESSION: Mild CHF. Electronically Signed   By: Kathreen Devoid M.D.   On: 10/23/2022 09:50   [place] EKG SITE RITE  Result Date: 10/23/2022 If Site Rite image not attached, placement could not be confirmed due to current cardiac rhythm.  DG Chest Port 1 View  Result Date: 10/22/2022 CLINICAL DATA:  [id number].  Ventilator dependent respiratory failure. EXAM: PORTABLE CHEST 1 VIEW COMPARISON:  CT abdomen and pelvis earlier today, portable chest and chest CT both 11/25/2016. FINDINGS: 11:23 p.m. Interval intubation. Tip of the ETT is 4.3 cm from the carina. NGT courses to the left in the stomach with the tip in the proximal fundus. 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 There is tortuosity and calcification of the aorta with stable mediastinum. Small loculated left pleural effusion with pleural calcifications better demonstrated on CT. There is mild cardiomegaly with normal caliber central vessel markings. There is interval worsening of the lung fields with left-greater-than-right  perihilar opacities developed and small layering pleural effusions. Findings could be due to pneumonia or edema. There is increasing left lower lobe consolidation as well. There is thoracic spondylosis. IMPRESSION: 1. Interval worsening of the lung fields with left-greater-than-right perihilar opacities and increasing left lower lobe consolidation. Findings could be due to pneumonia or edema. Clinical correlation and continued radiographic follow-up recommended. 2. Small layering pleural effusions. 3. Mild cardiomegaly. 4. Aortic atherosclerosis. Electronically Signed   By: Telford Nab M.D.   On: 10/22/2022 23:50   CT ABDOMEN PELVIS W CONTRAST  Result Date: 10/22/2022 CLINICAL DATA:  History of colon cancer. EXAM: CT ABDOMEN AND PELVIS WITH CONTRAST TECHNIQUE: Multidetector CT imaging of the abdomen and pelvis was performed using the standard protocol following bolus administration of intravenous contrast. RADIATION DOSE REDUCTION: This exam was performed according to the departmental dose-optimization program which includes automated exposure control, adjustment of the mA and/or kV according to patient size and/or use of iterative reconstruction technique. CONTRAST:  191m OMNIPAQUE IOHEXOL 300 MG/ML  SOLN COMPARISON:  05/11/2018 FINDINGS: Lower chest: Chronic left pleural effusion with pleural calcifications. Hepatobiliary: No focal liver abnormality is seen. No gallstones, gallbladder wall thickening, or biliary dilatation. Pancreas: Unremarkable. No pancreatic ductal dilatation or surrounding inflammatory changes. Spleen: Normal in size without focal abnormality. Adrenals/Urinary Tract: Adrenal glands are unremarkable. No urolithiasis or obstructive uropathy. 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 Stomach/Bowel: Numerous dilated loops of small bowel measuring up to 4.3 cm with multiple air-fluid levels. Ventral  abdominal hernia containing numerous dilated loops of small bowel, surrounding soft tissue edema concerning for an incarcerated hernia resulting in small bowel obstruction. Distended stomach with an air-fluid level. Small hiatal  hernia. Prior colectomy. Vascular/Lymphatic: Normal caliber abdominal aorta with mild atherosclerosis. No lymphadenopathy. Reproductive: Enlarged prostate gland. Other: Fluid containing right inguinal hernia. Fat containing left inguinal hernia. No abdominal ascites. Musculoskeletal: No acute osseous abnormality. No aggressive osseous lesion. IMPRESSION: 1. Ventral abdominal hernia containing numerous dilated loops of small bowel, surrounding soft tissue edema concerning for an incarcerated hernia resulting in small bowel obstruction. 2. 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 3. Chronic left pleural effusion with pleural calcifications. 4.  Aortic Atherosclerosis (ICD10-I70.0). Electronically Signed   By: Kathreen Devoid M.D.   On: 10/22/2022 17:43    Microbiology: Results for orders placed or performed during the hospital encounter of 10/22/22  MRSA Next Gen by PCR, Nasal     Status: None   Collection Time: 10/22/22 11:32 PM   Specimen: Nasal Mucosa; Nasal Swab  Result Value Ref Range Status   MRSA by PCR Next Gen NOT DETECTED NOT DETECTED Final    Comment: (NOTE) The GeneXpert MRSA Assay (FDA approved for NASAL specimens only), is one component of a comprehensive MRSA colonization surveillance program. It is not intended to diagnose MRSA infection nor to guide or monitor treatment for MRSA infections. Test performance is not FDA approved in patients less than 41 years old. Performed at [facility], [address], [place], [place] [address]   Culture, blood (Routine X 2) w Reflex to ID Panel     Status: None   Collection Time: 10/23/22  9:09 AM   Specimen: Right Antecubital; Blood   Result Value Ref Range Status   Specimen Description   Final    RIGHT ANTECUBITAL BOTTLES DRAWN AEROBIC AND ANAEROBIC   Special Requests Blood Culture adequate volume  Final   Culture   Final    NO GROWTH 5 DAYS Performed at [facility], [address] [place], [place] [address]    Report Status 10/28/2022 FINAL  Final  Culture, blood (Routine X 2) w Reflex to ID Panel     Status: None   Collection Time: 10/23/22  9:09 AM   Specimen: BLOOD RIGHT HAND  Result Value Ref Range Status   Specimen Description   Final    BLOOD RIGHT HAND BOTTLES DRAWN AEROBIC AND ANAEROBIC   Special Requests Blood Culture adequate volume  Final   Culture   Final    NO GROWTH 5 DAYS Performed at [facility], [address], [place], [place] [address]    Report Status 10/28/2022 FINAL  Final  Urine Culture     Status: None   Collection Time: 10/28/22  1:49 PM   Specimen: Urine, Clean Catch  Result Value Ref Range Status   Specimen Description URINE, CLEAN CATCH  Final   Special Requests NONE  Final   Culture   Final    NO GROWTH Performed at [facility], [place] [address] [place], [place] [address]    Report Status 10/29/2022 FINAL  Final  Urine Culture     Status: None   Collection Time: 10/28/22  9:42 PM   Specimen: Urine, Clean Catch  Result Value Ref Range Status   Specimen Description URINE, CLEAN CATCH  Final   Special Requests NONE  Final   Culture   Final    NO GROWTH Performed at [facility], [place] [address], [place], [place] [address]    Report Status 10/30/2022 FINAL  Final    Labs: CBC: Recent Labs  Lab 10/27/22 1920 10/28/22 0027 10/29/22 0055 10/30/22 0023 10/31/22 0039  WBC 10.4 9.5 9.3 9.6 9.3  HGB 8.7* 8.5* 7.7* 8.4* 8.5*  HCT 26.6* 24.8* 23.8* 25.9* 25.9*  MCV 96.4 94.3 96.7 97.4 98.1  PLT 181 176 214 277 [id number]   Basic Metabolic Panel: Recent Labs  Lab 10/25/22 1851 10/26/22 0545 10/27/22 0400 10/27/22 1540 10/28/22 0027 10/29/22 0055  10/30/22 0023 10/31/22 0039  NA 140 139 137 138 138 140 137 136  K 2.7* 3.5 2.7* 3.5 3.1* 3.3* 3.2* 3.5  CL 105 104 97* 102 103 105 103 104  CO2 23 23 27 26 25 22 28 26  $ GLUCOSE 134* 142* 105* 133* 131* 128* 133* 138*  BUN 25* 25* 26* 26* 24* 27* 21 20  CREATININE 0.98 0.99 0.95 0.95 0.90 0.80 0.76 0.71  CALCIUM 8.3* 8.2* 8.5* 8.6* 8.4* 8.3* 8.3* 8.4*  MG 2.0  --  2.0  --  1.8 2.1 2.0 1.9  PHOS 2.4* 2.7 2.5  --  2.2* 3.4  --   --    Liver Function Tests: No results for input(s): "AST", "ALT", "ALKPHOS", "BILITOT", "PROT", "ALBUMIN" in the last 168 hours. CBG: Recent Labs  Lab 10/31/22 1110 10/31/22 1701 10/31/22 2117 11/01/22 0606 11/01/22 1103  GLUCAP 120* 142* 127* 112* 91    Discharge time spent: greater than 30 minutes.  Signed: [name], MD [facility] Hospitalists 11/01/2022

## 2022-11-01 NOTE — Care Management Important Message (Signed)
Important Message  Patient Details  Name: Craig Fields MRN: [id number] Date of Birth: November 11, 1940   Medicare Important Message Given:  Yes     Shelda Altes 11/01/2022, 8:20 AM

## 2022-11-01 NOTE — Progress Notes (Signed)
Pt being discharged, VSS, attempted to call report no answer, IV removed.   Alvis Lemmings, RN 11/01/2022 4:38 PM

## 2022-11-01 NOTE — Discharge Instructions (Signed)
[facility]      [facility] [phone]  OPEN ABDOMINAL SURGERY: POST OP INSTRUCTIONS  Always review your discharge instruction sheet given to you by the facility where your surgery was performed.  IF YOU HAVE DISABILITY OR FAMILY LEAVE FORMS, YOU MUST BRING THEM TO THE OFFICE FOR PROCESSING.  PLEASE DO NOT GIVE THEM TO YOUR DOCTOR.  A prescription for pain medication may be given to you upon discharge.  Take your pain medication as prescribed, if needed.  If narcotic pain medicine is not needed, then you may take acetaminophen (Tylenol) or ibuprofen (Advil) as needed. Take your usually prescribed medications unless otherwise directed. If you need a refill on your pain medication, please contact your pharmacy. They will contact our office to request authorization.  Prescriptions will not be filled after 5pm or on week-ends. You should follow a light diet the first few days after arrival home, such as soup and crackers, pudding, etc.unless your doctor has advised otherwise. A high-fiber, low fat diet can be resumed as tolerated.   Be sure to include lots of fluids daily. Most patients will experience some swelling and bruising on the chest and neck area.  Ice packs will help.  Swelling and bruising can take several days to resolve Most patients will experience some swelling and bruising in the area of the incision. Ice pack will help. Swelling and bruising can take several days to resolve..  It is common to experience some constipation if taking pain medication after surgery.  Increasing fluid intake and taking a stool softener will usually help or prevent this problem from occurring.  A mild laxative (Milk of Magnesia or Miralax) should be taken according to package directions if there are no bowel movements after 48 hours.  You may have steri-strips (small skin tapes) in place directly over the incision.  These strips should be left on the skin for 7-10 days.  If your surgeon used skin  glue on the incision, you may shower in 24 hours.  The glue will flake off over the next 2-3 weeks.  Any sutures or staples will be removed at the office during your follow-up visit. You may find that a light gauze bandage over your incision may keep your staples from being rubbed or pulled. You may shower and replace the bandage daily. ACTIVITIES:  You may resume regular (light) daily activities beginning the next day--such as daily self-care, walking, climbing stairs--gradually increasing activities as tolerated.  You may have sexual intercourse when it is comfortable.  Refrain from any heavy lifting or straining until approved by your doctor. You may drive when you no longer are taking prescription pain medication, you can comfortably wear a seatbelt, and you can safely maneuver your car and apply brakes Return to Work: ___________________________________ You should see your doctor in the office for a follow-up appointment approximately two weeks after your surgery.  Make sure that you call for this appointment within a day or two after you arrive home to insure a convenient appointment time. OTHER INSTRUCTIONS:  _____________________________________________________________ _____________________________________________________________  WHEN TO CALL YOUR DOCTOR: Fever over 101.0 Inability to urinate Nausea and/or vomiting Extreme swelling or bruising Continued bleeding from incision. Increased pain, redness, or drainage from the incision. Difficulty swallowing or breathing Muscle cramping or spasms. Numbness or tingling in hands or feet or around lips.  The clinic staff is available to answer your questions during regular business hours.  Please don't hesitate to call and ask to speak to one of   the nurses if you have concerns.  For further questions, please visit [email or web address]  

## 2022-11-02 ENCOUNTER — Non-Acute Institutional Stay (SKILLED_NURSING_FACILITY): Payer: Medicare Other | Admitting: Internal Medicine

## 2022-11-02 ENCOUNTER — Encounter: Payer: Self-pay | Admitting: Internal Medicine

## 2022-11-02 DIAGNOSIS — I4891 Unspecified atrial fibrillation: Secondary | ICD-10-CM | POA: Diagnosis not present

## 2022-11-02 DIAGNOSIS — D62 Acute posthemorrhagic anemia: Secondary | ICD-10-CM

## 2022-11-02 DIAGNOSIS — K460 Unspecified abdominal hernia with obstruction, without gangrene: Secondary | ICD-10-CM

## 2022-11-02 DIAGNOSIS — R29818 Other symptoms and signs involving the nervous system: Secondary | ICD-10-CM

## 2022-11-02 DIAGNOSIS — I10 Essential (primary) hypertension: Secondary | ICD-10-CM | POA: Diagnosis not present

## 2022-11-02 DIAGNOSIS — R4189 Other symptoms and signs involving cognitive functions and awareness: Secondary | ICD-10-CM | POA: Diagnosis not present

## 2022-11-02 NOTE — Assessment & Plan Note (Addendum)
He is unaware of the sleep apnea and had no idea what CPAP was or its indication. Apparently he does have a CPAP at home that his wife does states that she is not sure where it is and believes that it is "corroded."

## 2022-11-02 NOTE — Assessment & Plan Note (Addendum)
On exam breath sounds are decreased without abnormal auscultatory findings.  O2 sats on room air are excellent.  SLT to monitor at SNF for aspiration.

## 2022-11-02 NOTE — Assessment & Plan Note (Signed)
No bleeding dyscrasias reported; continue to monitor on DOAC.

## 2022-11-02 NOTE — Progress Notes (Signed)
NURSING HOME LOCATION:  [facility] ROOM NUMBER:  136 P  CODE STATUS:  Full Code  [id number] Simpson MD  This is a comprehensive admission note to this SNFperformed on this date less than 30 days from date of admission. Included are preadmission medical/surgical history; reconciled medication list; family history; social history and comprehensive review of systems.  Corrections and additions to the records were documented. Comprehensive physical exam was also performed. Additionally a clinical summary was entered for each active diagnosis pertinent to this admission in the Problem List to enhance continuity of care.  HPI: He was hospitalized 2/9 - 11/01/2022 with incarcerated hernia.  He presented with abdominal pain, nausea and vomiting.  CT of the abdomen revealed ventral hernia with small bowel loops and surrounding soft tissue edema concerning for incarcerated hernia and associated SBO.  Emergently he underwent exploratory laparotomy, ventral hernia repair, and resection of 80 cm of necrotic bowel by Dr Okey Dupre, GS.   Postop course was complicated by septic and cardiogenic shock with ongoing hypotension requiring norepinephrine in spite of volume resuscitation.Acute hypoxic respiratory failure with ventilator dependence was attributed to perioperative aspiration.  He remained on the ventilator until 2/11. He was transferred to the critical care services. Acute heart failure with reduced ejection fraction was attributed to tachycardia.  Cardiology initiated losartan.  Follow-up echo was recommended in 6-8 weeks after stabilization.  He also exhibited PAF with RVR with subsequent conversion to normal sinus rhythm with amiodarone.  Amiodarone taper included 400 mg twice daily through 2/27 with 200 mg daily as of 2/28.   Therapies for the cardiopulmonary complications included course of Zosyn antibiotics, rate and rhythm control agents, and fluid resuscitation & resulted in  stabilization. Acute blood loss anemia was present.  Hemoglobin was 12.8; nadir hemoglobin was 7.7.  Prior to discharge hemoglobin was 8.5.  DOAC was restarted postop with hemoglobin monitor. NSTEMI was attributed to demand myocardial ischemia with no signs of ACS.  Aspirin, beta-blocker, and statin were continued. He tolerated diet advancement with with resolution of postop ileus.  Wound care consulted for sacral DTPI POA which evolved into a stage II pressure injury. Urine was dark without associated dysuria.  Urine culture x 2 were negative.  The possibility of colovesical fistula was raised; this was deferred to outpatient setting with possible CT imaging.  Renal function remained stable with GFR consistently above 60. He was discharged to the SNF for rehab.  Past medical and surgical history: Includes OSA, history of stroke, essential hypertension, moderate protein/caloric malnutrition, anxiety disorder, chronic back pain, history of colon cancer, and CAD. Surgeries and procedures include CABG, prior incisional hernia repair with insertion of mesh, sigmoid colectomy in 2007, and left inguinal hernia repair in 2005.  Social history: Former smoker; nondrinker  Family history: reviewed   Review of systems: Clinical neurocognitive deficits made validity of responses questionable . Date given as " July 16, 1945".  He knew he had hernia surgery that could provide no other information about his 10-day hospitalization complicated by ARDS's and sepsis.  At this time he states he is "doing fair."  When I ask him to expound about hospitalization he began to talk about having fallen and having limited ability to complete ADLs as of Thanksgiving.  He referred to the other reasons for hospitalization as "that other stuff."  He made the comment "it is hard to remember all of that."  He then went on to discuss having had colon cancer.  He describes occasional  constipation.  He is unaware of what CPAP is and  its indication. He does describe exertional dyspnea simply bathing.  PT/OT reports he is full assist.  He is able to stand up from his recliner using a grab bar but employs a urinal and does not go to the bathroom. He denies abdominal pain "unless you mash on it." By history he has had "inner ear" issues and meclizine has been considered pretherapy.  Constitutional: No fever, significant weight change Eyes: No redness, discharge, pain, vision change ENT/mouth: No nasal congestion, purulent discharge, earache, change in hearing, sore throat  Cardiovascular: No chest pain, palpitations, paroxysmal nocturnal dyspnea  Respiratory: No cough, sputum production, hemoptysis,significant snoring, apnea  Gastrointestinal: No heartburn, dysphagia,  nausea /vomiting, rectal bleeding, melena Genitourinary: No dysuria, hematuria, pyuria Dermatologic: No rash, pruritus Neurologic: No  headache, syncope, seizures, numbness, tingling Psychiatric: No significant anxiety, depression, insomnia, anorexia Endocrine: No change in hair/skin/nails, excessive thirst, excessive hunger, excessive urination  Hematologic/lymphatic: No significant bruising, lymphadenopathy, abnormal bleeding Allergy/immunology: No itchy/watery eyes, significant sneezing, urticaria, angioedema  Physical exam:  Pertinent or positive findings: He is morbidly obese.  He appears younger than his age.  Affect is flat.  Pattern alopecia is present.  Eyebrows are thin laterally.  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  He is edentulous.  There is a large irregular eschar over the right cheek.  A grade 0000000 systolic murmur is present.  Breath sounds are decreased.  Abdomen is protuberant; operative staples are present in the midline.  Pedal pulses are decreased.  Limb atrophy is present greater of the right lower extremity.  There is a coarse intention tremor of the right hand.  The left hand is contracted.  There  General appearance: Adequately  nourished; no acute distress, increased work of breathing is present.   Lymphatic: No lymphadenopathy about the head, neck, axilla. Eyes: No conjunctival inflammation or lid edema is present. There is no scleral icterus. Ears:  External ear exam shows no significant lesions or deformities.   Nose:  External nasal examination shows no deformity or inflammation. Nasal mucosa are pink and moist without lesions, exudates Neck:  No thyromegaly, masses, tenderness noted.    Heart:  Normal rate and regular rhythm. S1 and S2 normal without gallop,  click, rub.  Lungs: without wheezes, rhonchi, rales, rubs. Abdomen: Bowel sounds are normal.  Abdomen is soft and nontender with no organomegaly, hernias, masses. GU: Deferred  Extremities:  No cyanosis, clubbing, edema. Neurologic exam: Balance, Rhomberg, finger to nose testing could not be completed due to clinical state Skin: Warm & dry w/o tenting.  See clinical summary under each active problem in the Problem List with associated updated therapeutic plan

## 2022-11-02 NOTE — Assessment & Plan Note (Addendum)
He is on amiodarone and rhythm is clinically regular.  Continue to monitor & wean Amiodarone as of 2/28.

## 2022-11-02 NOTE — Patient Instructions (Signed)
See assessment and plan under each diagnosis in the problem list and acutely for this visit 

## 2022-11-02 NOTE — Assessment & Plan Note (Signed)
BP controlled; no change in antihypertensive medications  

## 2022-11-02 NOTE — Assessment & Plan Note (Signed)
MMSE will be performed @ SNF

## 2022-11-06 ENCOUNTER — Other Ambulatory Visit (HOSPITAL_COMMUNITY)
Admission: RE | Admit: 2022-11-06 | Discharge: 2022-11-06 | Disposition: A | Discharge status: Home / Self Care | Payer: Medicare Other | Source: Skilled Nursing Facility | Attending: Adult Health | Admitting: Adult Health

## 2022-11-06 DIAGNOSIS — M6281 Muscle weakness (generalized): Secondary | ICD-10-CM | POA: Insufficient documentation

## 2022-11-06 LAB — URINALYSIS, W/ REFLEX TO CULTURE (INFECTION SUSPECTED)
Bacteria, UA: NONE SEEN
Bilirubin Urine: 0 — AB
Glucose, UA: 0 mg/dL — AB
Hgb urine dipstick: 3 — AB
Ketones, ur: 0 mg/dL — AB
Leukocytes,Ua: 2 — AB
Nitrite: 0 — AB
Protein, ur: 30 mg/dL — AB
RBC / HPF: >50 RBC/hpf (ref 0–5)
Specific Gravity, Urine: 1.021 (ref 1.005–1.030)
pH: 5 (ref 5.0–8.0)

## 2022-11-08 LAB — URINE CULTURE

## 2022-11-09 ENCOUNTER — Non-Acute Institutional Stay (INDEPENDENT_AMBULATORY_CARE_PROVIDER_SITE_OTHER): Payer: Medicare Other | Admitting: Family Medicine

## 2022-11-09 ENCOUNTER — Encounter: Payer: Self-pay | Admitting: Family Medicine

## 2022-11-09 DIAGNOSIS — R8281 Pyuria: Secondary | ICD-10-CM

## 2022-11-09 DIAGNOSIS — M79605 Pain in left leg: Secondary | ICD-10-CM

## 2022-11-09 NOTE — Progress Notes (Signed)
Location:  [place] of Service:   [place]: Full Code  Allergies  Allergen Reactions   Bee Venom Swelling   Niacin Other (See Comments)    Muscle aches    Chief Complaint  Patient presents with   Hip Pain    HPI:  Asked to see Craig Fields at bedside by his nurse.  He is complaining of left thigh/hip pain when he is moved for bathing.  It changes from a low level ache to a severe hard pain when he is moved He believes this pain has been going on for months. It has been worsened with walking in past. He locates it to the back of his thigh. APAP helps with pain but it returns once the APAP has worn off.   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  No obvious fractures.  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   Left side    Essential hypertension    Hematuria 04/30/2019   New complaint, does nott have pain of kidney stones, needs to submit UA for testing asap and needs urology eval   Hyperlipidemia    IGT (impaired glucose tolerance)    Obesity    OSA (obstructive sleep apnea)    Seizures ([place])    Age 14 or 3, no meds and no reoccurances   Skin lesion 12/07/2012   Skin tag 11/16/2011   Stroke [facility])    Umbilical hernia     Past Surgical History:  Procedure Laterality Date   BOWEL RESECTION N/A 10/22/2022   Procedure: SMALL BOWEL RESECTION;  Surgeon: Rusty Aus, DO;  Location: [facility] ORS;  Service: General;  Laterality: N/A;   COLONOSCOPY N/A 07/15/2015   Procedure: COLONOSCOPY;  Surgeon: Aviva Signs, MD;  Location: [facility] ENDO SUITE;  Service: Gastroenterology;  Laterality: N/A;   CORONARY ARTERY BYPASS GRAFT   09/13/2002   x4   HERNIA REPAIR     INCISIONAL HERNIA REPAIR N/A 01/01/2013   Procedure: HERNIA REPAIR INCISIONAL;  Surgeon: Jamesetta So, MD;  Location: [facility] ORS;  Service: General;  Laterality: N/A;  umbilical area   INSERTION OF MESH N/A 01/01/2013   Procedure: INSERTION OF MESH;  Surgeon: Jamesetta So, MD;  Location: [facility] ORS;  Service: General;  Laterality: N/A;   LAPAROTOMY N/A 10/22/2022   Procedure: EXPLORATORY LAPAROTOMY;  Surgeon: Rusty Aus, DO;  Location: [facility] ORS;  Service: General;  Laterality: N/A;   Left inguinal  hernia repair  09/14/2003   OMENTECTOMY N/A 10/22/2022   Procedure: OMENTECTOMY;  Surgeon: Rusty Aus, DO;  Location: [facility] ORS;  Service: General;  Laterality: N/A;   [name]  09/13/2005   VENTRAL HERNIA REPAIR N/A 10/22/2022   Procedure: HERNIA REPAIR VENTRAL ADULT;  Surgeon: Rusty Aus, DO;  Location: [facility] ORS;  Service: General;  Laterality: N/A;    Social History   Socioeconomic History   Marital status: Married    Spouse name: Jolayne Haines   Number of children: 2   Years of education: Not on file   Highest education level: 12th grade  Occupational History   Occupation: retired   Tobacco Use  Smoking status: Former    Packs/day: 0.50    Years: 5.00    Total pack years: 2.50    Types: Cigarettes    Quit date: 12/27/1980    Years since quitting: 41.8   Smokeless tobacco: Former    Types: Chew    Quit date: 01/24/2013  Vaping Use   Vaping Use: Never used  Substance and Sexual Activity   Alcohol use: No    Alcohol/week: 0.0 standard drinks of alcohol   Drug use: No   Sexual activity: Not Currently    Birth control/protection: None  Other Topics Concern   Not on file  Social History Narrative   Not on file   Social Determinants of Health   Financial Resource Strain: Low Risk  (06/28/2022)   Overall Financial Resource Strain (CARDIA)    Difficulty of Paying Living Expenses: Not hard at all  Food Insecurity:  No Food Insecurity (10/23/2022)   Hunger Vital Sign    Worried About Running Out of Food in the Last Year: Never true    Ran Out of Food in the Last Year: Never true  Transportation Needs: No Transportation Needs (10/23/2022)   PRAPARE - Hydrologist (Medical): No    Lack of Transportation (Non-Medical): No  Physical Activity: Inactive (06/28/2022)   Exercise Vital Sign    Days of Exercise per Week: 0 days    Minutes of Exercise per Session: 0 min  Stress: No Stress Concern Present (06/28/2022)   [place]    Feeling of Stress : Not at all  Social Connections: Moderately Integrated (06/28/2022)   Social Connection and Isolation Panel [NHANES]    Frequency of Communication with Friends and Family: More than three times a week    Frequency of Social Gatherings with Friends and Family: Three times a week    Attends Religious Services: More than 4 times per year    Active Member of Clubs or Organizations: No    Attends Archivist Meetings: Never    Marital Status: Married  Human resources officer Violence: Not At Risk (10/23/2022)   Humiliation, Afraid, Rape, and Kick questionnaire    Fear of Current or Ex-Partner: No    Emotionally Abused: No    Physically Abused: No    Sexually Abused: No   Family History  Problem Relation Age of Onset   Pancreatic cancer Mother    Heart disease Father    Prostate cancer Father    Stroke Brother    Heart disease Brother    Heart disease Brother    COPD Sister    Actinic keratosis Sister    Obesity Son    Anxiety disorder Son       VITAL SIGNS Afebrile, VSS Left hip/leg: No point tenderness lateral, anterior, posterior thigh Complaint pain reproduced with pssive internal log roll maneuver of left leg and with passive hip flexion ato 20 degrees.  No inguinal TTP No bruising of thigh  Outpatient Encounter Medications as of 11/09/2022   Medication Sig   acetaminophen (TYLENOL) 325 MG tablet Take 2 tablets (650 mg total) by mouth every 4 (four) hours as needed for fever, mild pain, moderate pain or headache.   amiodarone (PACERONE) 200 MG tablet Take 2 tablets (400 mg) twice daily until 2/27. Starting 2/28, take one tablet (200 mg) daily   apixaban (ELIQUIS) 5 MG TABS tablet Take 1 tablet (5 mg total) by mouth 2 (two) times daily.  aspirin 81 MG EC tablet Take 81 mg by mouth every evening.   Cholecalciferol (VITAMIN D) 2000 units CAPS Take 1 capsule by mouth 2 (two) times daily.   Coenzyme Q10 (CO Q-10) 200 MG CAPS Take 1 tablet by mouth daily.    losartan (COZAAR) 25 MG tablet Take 0.5 tablets (12.5 mg total) by mouth daily.   metoprolol succinate (TOPROL-XL) 25 MG 24 hr tablet Take 0.5 tablets (12.5 mg total) by mouth daily.   Multiple Vitamins-Minerals ([employer] SPECTRAVITE) TABS Take 1 tablet by mouth every evening.   polyethylene glycol powder (GLYCOLAX/MIRALAX) powder MIX TO TAKE 17 G DAILY AS DIRECTED (Patient taking differently: Take 17 g by mouth daily as needed for mild constipation.)   simvastatin (ZOCOR) 20 MG tablet TAKE 1 TABLET BY MOUTH EVERYDAY AT BEDTIME (Patient taking differently: Take 20 mg by mouth daily.)   traZODone (DESYREL) 50 MG tablet Take 1 tablet (50 mg total) by mouth at bedtime as needed for sleep.   UNABLE TO FIND Commode assist chair   UNABLE TO FIND Bilateral new shoes  Left AFO with calliber plate  Dx [id number]   UNABLE TO FIND Right custom torch walker x 1  Dx [id number]   No facility-administered encounter medications on file as of 11/09/2022.     SIGNIFICANT DIAGNOSTIC EXAMS       ASSESSMENT/ PLAN:  Left thigh/hip pain, chronic - Worse with internal rotation and CT AP findings of Left hip osteoarthritis changes could favor hip osteoarthritis as source of pain.   Pyruria  - Urinalysis with pyuria - Patient with Funguria on 10/28/22 urinalysis -- Likely source of pyuria. It does not  require treatment in absence of fungemia  Recommend Schedule APAP DG Hip Xrays to look for new injury since CT AP about 2 1/2 weeks ago. Physical Therapy evaluation and treatment   Ok Edwards NP [facility]  Contact [phone] Monday through Friday 8am- 5pm  After hours call [phone]

## 2022-11-10 ENCOUNTER — Encounter: Payer: Self-pay | Admitting: Surgery

## 2022-11-10 ENCOUNTER — Ambulatory Visit (INDEPENDENT_AMBULATORY_CARE_PROVIDER_SITE_OTHER): Payer: Medicare Other | Admitting: Surgery

## 2022-11-10 VITALS — BP 130/72 | HR 70 | Temp 97.3°F | Resp 14 | Ht 70.0 in | Wt 211.0 lb

## 2022-11-10 DIAGNOSIS — Z7401 Bed confinement status: Secondary | ICD-10-CM | POA: Diagnosis not present

## 2022-11-10 DIAGNOSIS — Z09 Encounter for follow-up examination after completed treatment for conditions other than malignant neoplasm: Secondary | ICD-10-CM

## 2022-11-10 DIAGNOSIS — I959 Hypotension, unspecified: Secondary | ICD-10-CM | POA: Diagnosis not present

## 2022-11-10 NOTE — Progress Notes (Signed)
[facility] Note   HPI:  82 y.o. Male presents to clinic for post-op follow-up status post exploratory laparotomy, primary ventral hernia repair, small bowel resection, and partial omentectomy on 2/9 for an incarcerated ventral hernia causing small bowel obstruction.  Patient was transferred down to [facility] postoperatively for management by critical care.  He was discharged on 2/19.  He is currently at an assisted living facility.  He is tolerating a diet without nausea and vomiting, though he does have slightly decreased appetite.  He is moving his bowels without issue.  He denies any issues at his incision site.  Review of Systems:  All other review of systems: otherwise negative   Vital Signs:  BP 130/72   Pulse 70   Temp (!) 97.3 F (36.3 C) (Other (Comment))   Resp 14   Ht '5\' 10"'$  (1.778 m)   Wt 211 lb (95.7 kg)   SpO2 98%   BMI 30.28 kg/m    Physical Exam:  Physical Exam Vitals reviewed.  Constitutional:      Appearance: Normal appearance.  Abdominal:     Comments: Abdomen soft, nondistended, no percussion tenderness, nontender to palpation; no rigidity, guarding, rebound tenderness; midline incision healing well with skin staples in place, palpable fullness under incision, question seroma  Neurological:     Mental Status: He is alert.     Laboratory studies: None  Imaging:  None  Pathology: A. SMALL BOWEL, PORTION, RESECTION:  - Segment of small intestine (68 cm) showing congestion, hemorrhage and  ischemic necrosis, consistent with patient's clinical history of  incarcerated hernia and small bowel obstruction   B. HERNIA SAC, EXCISION:  - Hernia sac with congestion and reactive changes   Assessment:  82 y.o. yo Male who presents for follow-up status post exploratory laparotomy, primary ventral hernia repair, small bowel resection, partial omentectomy on 2/9 for an incarcerated ventral hernia causing small bowel obstruction.  Plan:   -Patient complains of weakness and decreased appetite since discharge from the hospital, but is overall doing well from a bowel standpoint -Pain is adequately controlled, and currently denying any pain at his incision site -Skin staples removed and Steri-Strips placed.  Advised that Steri-Strips will fall off in the next 3 to 7 days -We discussed that he is high risk for his hernia recurring, as I was only able to primarily repair it, and with him being sick postoperatively, it is possible that the fascia would not heal appropriately.  He understands return precautions for evaluation of recurrent hernia, including nausea, vomiting, worsening abdominal pain, and obstipation - Follow up  All of the above recommendations were discussed with the patient and patient's family, and all of patient's and family's questions were answered to their expressed satisfaction.  Graciella Freer, DO [facility] [address] [place], [place] [address] [phone] (office)

## 2022-11-11 ENCOUNTER — Other Ambulatory Visit: Payer: Self-pay | Admitting: *Deleted

## 2022-11-11 NOTE — Patient Outreach (Signed)
Craig Fields resides in [facility]. Screening for potential [facility] care coordination services as benefit of health plan and PCP.   Update received from [place], [place] social worker. Craig Fields anticipated transition plan is return home with spouse. Staff currently using hoyer lift.   Will continue to follow.  Craig Rolling, MSN, RN,BSN [place] Acute Care Coordinator [phone] (Direct dial)

## 2022-11-12 ENCOUNTER — Encounter: Payer: Self-pay | Admitting: Adult Health

## 2022-11-12 ENCOUNTER — Non-Acute Institutional Stay (SKILLED_NURSING_FACILITY): Payer: Medicare Other | Admitting: Adult Health

## 2022-11-12 DIAGNOSIS — I700 Atherosclerosis of aorta: Secondary | ICD-10-CM | POA: Insufficient documentation

## 2022-11-12 DIAGNOSIS — I69954 Hemiplegia and hemiparesis following unspecified cerebrovascular disease affecting left non-dominant side: Secondary | ICD-10-CM

## 2022-11-12 DIAGNOSIS — I4891 Unspecified atrial fibrillation: Secondary | ICD-10-CM

## 2022-11-12 NOTE — Progress Notes (Signed)
Location:  [facility] Room Number: 136 P Place of Service:  SNF (31) Provider:  Ok Edwards, NP   CODE STATUS: FULL CODE  Allergies  Allergen Reactions   Bee Venom Swelling   Niacin Other (See Comments)    Muscle aches    Chief Complaint  Patient presents with   Acute Visit   Asthma    Care plan meeting    HPI:  We have come together for his care plan meeting. Family present.  BIMS 12/15  mood 3/30: decreased energy. Is nonambulatory with no falls since admission. He requires max to dependent assist with his adls. He is incontinent of bladder and bowel. Dietary: Is D3 with thin liquids appetite 25-50%; weight is on supplements is 206.9 pounds; requires assist with meals; is starting to eat more. Therapy: upper body is max assist lower body and brp and self feeding transfers are all at dependent. Have attempted wheelchair with various types unable to sit in. Is out of bed to geri chair. Sitting on edge of bed is 15 seconds. SLUMS 12/30. Marland Kitchen Activities: 1:1 activities. 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  Goal is to return back home. Could get out of bed can stand; bedside commode; used quad cane.   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    Essential hypertension    Hematuria 04/30/2019   New complaint, does nott have pain of kidney stones, needs to submit UA for testing asap and needs urology eval   Hyperlipidemia    IGT (impaired glucose tolerance)    Obesity    OSA (obstructive sleep apnea)     Seizures ([place])    Age 82 or 3, no meds and no reoccurances   Skin lesion 12/07/2012   Skin tag 11/16/2011   Stroke [facility])    Umbilical hernia     Past Surgical History:  Procedure Laterality Date   BOWEL RESECTION N/A 10/22/2022   Procedure: SMALL BOWEL RESECTION;  Surgeon: Rusty Aus, DO;  Location: [facility] ORS;  Service: General;  Laterality: N/A;   COLONOSCOPY N/A 07/15/2015   Procedure: COLONOSCOPY;  Surgeon: Aviva Signs, MD;  Location: [facility] ENDO SUITE;  Service: Gastroenterology;  Laterality: N/A;   CORONARY ARTERY BYPASS GRAFT  09/13/2002   x4   HERNIA REPAIR     INCISIONAL HERNIA REPAIR N/A 01/01/2013   Procedure: HERNIA REPAIR INCISIONAL;  Surgeon: Jamesetta So, MD;  Location: [facility] ORS;  Service: General;  Laterality: N/A;  umbilical area   INSERTION OF MESH N/A 01/01/2013   Procedure: INSERTION OF MESH;  Surgeon: Jamesetta So, MD;  Location: [facility] ORS;  Service: General;  Laterality: N/A;   LAPAROTOMY N/A 10/22/2022   Procedure: EXPLORATORY LAPAROTOMY;  Surgeon: Rusty Aus, DO;  Location: [facility] ORS;  Service: General;  Laterality: N/A;   Left inguinal  hernia repair  09/14/2003   OMENTECTOMY N/A 10/22/2022   Procedure: OMENTECTOMY;  Surgeon: Rusty Aus, DO;  Location: [facility] ORS;  Service: General;  Laterality: N/A;   [name]  09/13/2005  VENTRAL HERNIA REPAIR N/A 10/22/2022   Procedure: HERNIA REPAIR VENTRAL ADULT;  Surgeon: Rusty Aus, DO;  Location: [facility] ORS;  Service: General;  Laterality: N/A;    Social History   Socioeconomic History   Marital status: Married    Spouse name: Jolayne Haines   Number of children: 2   Years of education: Not on file   Highest education level: 12th grade  Occupational History   Occupation: retired   Tobacco Use   Smoking status: Former    Packs/day: 0.50    Years: 5.00    Total pack years: 2.50    Types: Cigarettes    Quit date: 12/27/1980    Years since quitting: 41.9   Smokeless tobacco: Former     Types: Chew    Quit date: 01/24/2013  Vaping Use   Vaping Use: Never used  Substance and Sexual Activity   Alcohol use: No    Alcohol/week: 0.0 standard drinks of alcohol   Drug use: No   Sexual activity: Not Currently    Birth control/protection: None  Other Topics Concern   Not on file  Social History Narrative   Not on file   Social Determinants of Health   Financial Resource Strain: Low Risk  (06/28/2022)   Overall Financial Resource Strain (CARDIA)    Difficulty of Paying Living Expenses: Not hard at all  Food Insecurity: No Food Insecurity (10/23/2022)   Hunger Vital Sign    Worried About Running Out of Food in the Last Year: Never true    [place] in the Last Year: Never true  Transportation Needs: No Transportation Needs (10/23/2022)   PRAPARE - Hydrologist (Medical): No    Lack of Transportation (Non-Medical): No  Physical Activity: Inactive (06/28/2022)   Exercise Vital Sign    Days of Exercise per Week: 0 days    Minutes of Exercise per Session: 0 min  Stress: No Stress Concern Present (06/28/2022)   [place]    Feeling of Stress : Not at all  Social Connections: Moderately Integrated (06/28/2022)   Social Connection and Isolation Panel [NHANES]    Frequency of Communication with Friends and Family: More than three times a week    Frequency of Social Gatherings with Friends and Family: Three times a week    Attends Religious Services: More than 4 times per year    Active Member of Clubs or Organizations: No    Attends Archivist Meetings: Never    Marital Status: Married  Human resources officer Violence: Not At Risk (10/23/2022)   Humiliation, Afraid, Rape, and Kick questionnaire    Fear of Current or Ex-Partner: No    Emotionally Abused: No    Physically Abused: No    Sexually Abused: No   Family History  Problem Relation Age of Onset   Pancreatic  cancer Mother    Heart disease Father    Prostate cancer Father    Stroke Brother    Heart disease Brother    Heart disease Brother    COPD Sister    Actinic keratosis Sister    Obesity Son    Anxiety disorder Son       VITAL SIGNS BP 128/66   Pulse 66   Temp (!) 97.1 F (36.2 C)   Resp 20   Ht '5\' 10"'$  (1.778 m)   Wt 206 lb 14.4 oz (93.8 kg)   SpO2 96%  BMI 29.69 kg/m   Outpatient Encounter Medications as of 11/12/2022  Medication Sig   acetaminophen (TYLENOL) 325 MG tablet Take 2 tablets (650 mg total) by mouth every 4 (four) hours as needed for fever, mild pain, moderate pain or headache.   amiodarone (PACERONE) 200 MG tablet Take 2 tablets (400 mg) twice daily until 2/27. Starting 2/28, take one tablet (200 mg) daily   apixaban (ELIQUIS) 5 MG TABS tablet Take 1 tablet (5 mg total) by mouth 2 (two) times daily.   aspirin 81 MG EC tablet Take 81 mg by mouth every evening.   Cholecalciferol (VITAMIN D) 2000 units CAPS Take 1 capsule by mouth 2 (two) times daily.   Coenzyme Q10 (CO Q-10) 100 MG CAPS Take 2 tablets by mouth daily.   lidocaine 4 % Place 1 patch onto the skin daily. Apply to left hip every morning - remove at hs   losartan (COZAAR) 25 MG tablet Take 0.5 tablets (12.5 mg total) by mouth daily.   meclizine (ANTIVERT) 12.5 MG tablet Take 12.5 mg by mouth.  May give one hour prior to PT/OT as needed for vertigo thru 11/15/22 & then provider to reassess need. Once A Day - PRN   metoprolol succinate (TOPROL-XL) 25 MG 24 hr tablet Take 0.5 tablets (12.5 mg total) by mouth daily.   miconazole (MICOTIN) 2 % cream Apply 1 Application topically 2 (two) times daily.   Multiple Vitamins-Minerals ([employer] SPECTRAVITE) TABS Take 1 tablet by mouth every evening.   polyethylene glycol (MIRALAX / GLYCOLAX) 17 g packet Take 17 g by mouth daily.   simvastatin (ZOCOR) 20 MG tablet Take 20 mg by mouth at bedtime.   UNABLE TO FIND Commode assist chair   UNABLE TO FIND Bilateral new shoes   Left AFO with calliber plate  Dx [id number]   UNABLE TO FIND Right custom torch walker x 1  Dx [id number]   [DISCONTINUED] polyethylene glycol powder (GLYCOLAX/MIRALAX) powder MIX TO TAKE 17 G DAILY AS DIRECTED (Patient taking differently: Take 1 Container by mouth daily.)   [DISCONTINUED] simvastatin (ZOCOR) 20 MG tablet TAKE 1 TABLET BY MOUTH EVERYDAY AT BEDTIME (Patient taking differently: Take 20 mg by mouth daily in the afternoon.)   [DISCONTINUED] traZODone (DESYREL) 50 MG tablet Take 1 tablet (50 mg total) by mouth at bedtime as needed for sleep.   No facility-administered encounter medications on file as of 11/12/2022.     SIGNIFICANT DIAGNOSTIC EXAMS   Review of Systems  Constitutional:  Negative for malaise/fatigue.  Respiratory:  Negative for cough and shortness of breath.   Cardiovascular:  Negative for chest pain, palpitations and leg swelling.  Gastrointestinal:  Negative for abdominal pain, constipation and heartburn.  Musculoskeletal:  Negative for back pain, joint pain and myalgias.  Skin: Negative.   Neurological:  Negative for dizziness.  Psychiatric/Behavioral:  The patient is not nervous/anxious.     Physical Exam Constitutional:      General: He is not in acute distress.    Appearance: He is well-developed. He is morbidly obese. He is not diaphoretic.  Neck:     Thyroid: No thyromegaly.  Cardiovascular:     Rate and Rhythm: Normal rate and regular rhythm.     Pulses: Normal pulses.     Heart sounds: Murmur heard.  Pulmonary:     Effort: Pulmonary effort is normal. No respiratory distress.     Breath sounds: Normal breath sounds.  Abdominal:     General: Bowel sounds are normal. There is no  distension.     Palpations: Abdomen is soft.     Tenderness: There is no abdominal tenderness.  Musculoskeletal:     Cervical back: Neck supple.     Right lower leg: No edema.     Left lower leg: No edema.     Comments: Left hemiplegia   Lymphadenopathy:      Cervical: No cervical adenopathy.  Skin:    General: Skin is warm and dry.     Comments: Eschar area right cheek   Neurological:     Mental Status: He is alert. Mental status is at baseline.  Psychiatric:        Mood and Affect: Mood normal.       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   Time spent with patient: 40 minutes: therapy; medications; dietary    Ok Edwards NP [facility] Medicine  call [phone]

## 2022-11-22 ENCOUNTER — Encounter (HOSPITAL_COMMUNITY): Payer: Self-pay | Admitting: *Deleted

## 2022-11-22 ENCOUNTER — Other Ambulatory Visit: Payer: Self-pay

## 2022-11-22 ENCOUNTER — Emergency Department (HOSPITAL_COMMUNITY): Payer: Medicare Other

## 2022-11-22 ENCOUNTER — Inpatient Hospital Stay (HOSPITAL_COMMUNITY)
Admission: EM | Admit: 2022-11-22 | Discharge: 2022-11-29 | DRG: 291 | Disposition: A | Discharge status: Skilled Nursing Facility | Payer: Medicare Other | Source: Skilled Nursing Facility | Attending: Internal Medicine | Admitting: Internal Medicine

## 2022-11-22 DIAGNOSIS — I350 Nonrheumatic aortic (valve) stenosis: Secondary | ICD-10-CM | POA: Diagnosis not present

## 2022-11-22 DIAGNOSIS — R279 Unspecified lack of coordination: Secondary | ICD-10-CM | POA: Diagnosis not present

## 2022-11-22 DIAGNOSIS — E440 Moderate protein-calorie malnutrition: Secondary | ICD-10-CM | POA: Diagnosis present

## 2022-11-22 DIAGNOSIS — Z8249 Family history of ischemic heart disease and other diseases of the circulatory system: Secondary | ICD-10-CM | POA: Diagnosis not present

## 2022-11-22 DIAGNOSIS — D62 Acute posthemorrhagic anemia: Secondary | ICD-10-CM | POA: Diagnosis not present

## 2022-11-22 DIAGNOSIS — Z1152 Encounter for screening for COVID-19: Secondary | ICD-10-CM | POA: Diagnosis not present

## 2022-11-22 DIAGNOSIS — K4040 Unilateral inguinal hernia, with gangrene, not specified as recurrent: Secondary | ICD-10-CM | POA: Diagnosis not present

## 2022-11-22 DIAGNOSIS — I429 Cardiomyopathy, unspecified: Secondary | ICD-10-CM | POA: Diagnosis not present

## 2022-11-22 DIAGNOSIS — R410 Disorientation, unspecified: Secondary | ICD-10-CM | POA: Diagnosis not present

## 2022-11-22 DIAGNOSIS — Z800 Family history of malignant neoplasm of digestive organs: Secondary | ICD-10-CM

## 2022-11-22 DIAGNOSIS — I110 Hypertensive heart disease with heart failure: Secondary | ICD-10-CM | POA: Diagnosis present

## 2022-11-22 DIAGNOSIS — R918 Other nonspecific abnormal finding of lung field: Secondary | ICD-10-CM | POA: Diagnosis not present

## 2022-11-22 DIAGNOSIS — R600 Localized edema: Secondary | ICD-10-CM | POA: Diagnosis not present

## 2022-11-22 DIAGNOSIS — I2510 Atherosclerotic heart disease of native coronary artery without angina pectoris: Secondary | ICD-10-CM | POA: Diagnosis present

## 2022-11-22 DIAGNOSIS — N3289 Other specified disorders of bladder: Secondary | ICD-10-CM | POA: Diagnosis present

## 2022-11-22 DIAGNOSIS — Z8673 Personal history of transient ischemic attack (TIA), and cerebral infarction without residual deficits: Secondary | ICD-10-CM | POA: Diagnosis not present

## 2022-11-22 DIAGNOSIS — I480 Paroxysmal atrial fibrillation: Secondary | ICD-10-CM | POA: Diagnosis present

## 2022-11-22 DIAGNOSIS — I472 Ventricular tachycardia, unspecified: Secondary | ICD-10-CM | POA: Diagnosis not present

## 2022-11-22 DIAGNOSIS — I352 Nonrheumatic aortic (valve) stenosis with insufficiency: Secondary | ICD-10-CM | POA: Diagnosis not present

## 2022-11-22 DIAGNOSIS — I5043 Acute on chronic combined systolic (congestive) and diastolic (congestive) heart failure: Secondary | ICD-10-CM | POA: Diagnosis not present

## 2022-11-22 DIAGNOSIS — Z7901 Long term (current) use of anticoagulants: Secondary | ICD-10-CM | POA: Diagnosis not present

## 2022-11-22 DIAGNOSIS — N200 Calculus of kidney: Secondary | ICD-10-CM | POA: Diagnosis not present

## 2022-11-22 DIAGNOSIS — E785 Hyperlipidemia, unspecified: Secondary | ICD-10-CM | POA: Diagnosis present

## 2022-11-22 DIAGNOSIS — I7121 Aneurysm of the ascending aorta, without rupture: Secondary | ICD-10-CM | POA: Diagnosis present

## 2022-11-22 DIAGNOSIS — Z79899 Other long term (current) drug therapy: Secondary | ICD-10-CM | POA: Diagnosis not present

## 2022-11-22 DIAGNOSIS — R488 Other symbolic dysfunctions: Secondary | ICD-10-CM | POA: Diagnosis not present

## 2022-11-22 DIAGNOSIS — D531 Other megaloblastic anemias, not elsewhere classified: Secondary | ICD-10-CM | POA: Diagnosis not present

## 2022-11-22 DIAGNOSIS — I25700 Atherosclerosis of coronary artery bypass graft(s), unspecified, with unstable angina pectoris: Secondary | ICD-10-CM | POA: Diagnosis not present

## 2022-11-22 DIAGNOSIS — K436 Other and unspecified ventral hernia with obstruction, without gangrene: Secondary | ICD-10-CM | POA: Diagnosis not present

## 2022-11-22 DIAGNOSIS — Z825 Family history of asthma and other chronic lower respiratory diseases: Secondary | ICD-10-CM

## 2022-11-22 DIAGNOSIS — Z85038 Personal history of other malignant neoplasm of large intestine: Secondary | ICD-10-CM

## 2022-11-22 DIAGNOSIS — R609 Edema, unspecified: Principal | ICD-10-CM

## 2022-11-22 DIAGNOSIS — R319 Hematuria, unspecified: Secondary | ICD-10-CM | POA: Diagnosis not present

## 2022-11-22 DIAGNOSIS — R310 Gross hematuria: Secondary | ICD-10-CM | POA: Diagnosis not present

## 2022-11-22 DIAGNOSIS — E669 Obesity, unspecified: Secondary | ICD-10-CM | POA: Diagnosis present

## 2022-11-22 DIAGNOSIS — Z8042 Family history of malignant neoplasm of prostate: Secondary | ICD-10-CM

## 2022-11-22 DIAGNOSIS — D509 Iron deficiency anemia, unspecified: Secondary | ICD-10-CM | POA: Diagnosis present

## 2022-11-22 DIAGNOSIS — R9431 Abnormal electrocardiogram [ECG] [EKG]: Secondary | ICD-10-CM | POA: Insufficient documentation

## 2022-11-22 DIAGNOSIS — Z48815 Encounter for surgical aftercare following surgery on the digestive system: Secondary | ICD-10-CM | POA: Diagnosis not present

## 2022-11-22 DIAGNOSIS — G4733 Obstructive sleep apnea (adult) (pediatric): Secondary | ICD-10-CM | POA: Diagnosis not present

## 2022-11-22 DIAGNOSIS — E876 Hypokalemia: Secondary | ICD-10-CM | POA: Diagnosis not present

## 2022-11-22 DIAGNOSIS — Z951 Presence of aortocoronary bypass graft: Secondary | ICD-10-CM

## 2022-11-22 DIAGNOSIS — Z87891 Personal history of nicotine dependence: Secondary | ICD-10-CM

## 2022-11-22 DIAGNOSIS — E46 Unspecified protein-calorie malnutrition: Secondary | ICD-10-CM

## 2022-11-22 DIAGNOSIS — I5021 Acute systolic (congestive) heart failure: Secondary | ICD-10-CM | POA: Diagnosis not present

## 2022-11-22 DIAGNOSIS — M7989 Other specified soft tissue disorders: Secondary | ICD-10-CM | POA: Diagnosis not present

## 2022-11-22 DIAGNOSIS — Z823 Family history of stroke: Secondary | ICD-10-CM

## 2022-11-22 DIAGNOSIS — I10 Essential (primary) hypertension: Secondary | ICD-10-CM | POA: Diagnosis not present

## 2022-11-22 DIAGNOSIS — E782 Mixed hyperlipidemia: Secondary | ICD-10-CM | POA: Diagnosis present

## 2022-11-22 DIAGNOSIS — R911 Solitary pulmonary nodule: Secondary | ICD-10-CM | POA: Diagnosis present

## 2022-11-22 DIAGNOSIS — N289 Disorder of kidney and ureter, unspecified: Secondary | ICD-10-CM | POA: Diagnosis not present

## 2022-11-22 DIAGNOSIS — I214 Non-ST elevation (NSTEMI) myocardial infarction: Secondary | ICD-10-CM | POA: Diagnosis not present

## 2022-11-22 DIAGNOSIS — I25810 Atherosclerosis of coronary artery bypass graft(s) without angina pectoris: Secondary | ICD-10-CM | POA: Diagnosis not present

## 2022-11-22 DIAGNOSIS — I69354 Hemiplegia and hemiparesis following cerebral infarction affecting left non-dominant side: Secondary | ICD-10-CM | POA: Diagnosis not present

## 2022-11-22 DIAGNOSIS — E8809 Other disorders of plasma-protein metabolism, not elsewhere classified: Secondary | ICD-10-CM | POA: Diagnosis present

## 2022-11-22 DIAGNOSIS — I5023 Acute on chronic systolic (congestive) heart failure: Secondary | ICD-10-CM | POA: Diagnosis not present

## 2022-11-22 DIAGNOSIS — I700 Atherosclerosis of aorta: Secondary | ICD-10-CM | POA: Diagnosis not present

## 2022-11-22 DIAGNOSIS — J9811 Atelectasis: Secondary | ICD-10-CM | POA: Diagnosis not present

## 2022-11-22 DIAGNOSIS — J9601 Acute respiratory failure with hypoxia: Secondary | ICD-10-CM | POA: Diagnosis present

## 2022-11-22 DIAGNOSIS — Z6831 Body mass index (BMI) 31.0-31.9, adult: Secondary | ICD-10-CM

## 2022-11-22 DIAGNOSIS — Z7982 Long term (current) use of aspirin: Secondary | ICD-10-CM

## 2022-11-22 DIAGNOSIS — I4719 Other supraventricular tachycardia: Secondary | ICD-10-CM | POA: Diagnosis present

## 2022-11-22 DIAGNOSIS — J90 Pleural effusion, not elsewhere classified: Secondary | ICD-10-CM | POA: Diagnosis not present

## 2022-11-22 DIAGNOSIS — I509 Heart failure, unspecified: Principal | ICD-10-CM

## 2022-11-22 DIAGNOSIS — M6281 Muscle weakness (generalized): Secondary | ICD-10-CM | POA: Diagnosis not present

## 2022-11-22 LAB — CBC WITH DIFFERENTIAL/PLATELET
Abs Immature Granulocytes: 0.03 10*3/uL (ref 0.00–0.07)
Basophils Absolute: 0 10*3/uL (ref 0.0–0.1)
Basophils Relative: 0 %
Eosinophils Absolute: 0.2 10*3/uL (ref 0.0–0.5)
Eosinophils Relative: 2 %
HCT: 32.6 % — ABNORMAL LOW (ref 39.0–52.0)
Hemoglobin: 9.8 g/dL — ABNORMAL LOW (ref 13.0–17.0)
Immature Granulocytes: 0 %
Lymphocytes Relative: 12 %
Lymphs Abs: 1.1 10*3/uL (ref 0.7–4.0)
MCH: 30.4 pg (ref 26.0–34.0)
MCHC: 30.1 g/dL (ref 30.0–36.0)
MCV: 101.2 fL — ABNORMAL HIGH (ref 80.0–100.0)
Monocytes Absolute: 0.9 10*3/uL (ref 0.1–1.0)
Monocytes Relative: 9 %
Neutro Abs: 7.2 10*3/uL (ref 1.7–7.7)
Neutrophils Relative %: 77 %
Platelets: 216 10*3/uL (ref 150–400)
RBC: 3.22 MIL/uL — ABNORMAL LOW (ref 4.22–5.81)
RDW: 16.3 % — ABNORMAL HIGH (ref 11.5–15.5)
WBC: 9.4 10*3/uL (ref 4.0–10.5)
nRBC: 0 % (ref 0.0–0.2)

## 2022-11-22 LAB — COMPREHENSIVE METABOLIC PANEL
ALT: 14 U/L (ref 0–44)
AST: 16 U/L (ref 15–41)
Albumin: 2.8 g/dL — ABNORMAL LOW (ref 3.5–5.0)
Alkaline Phosphatase: 69 U/L (ref 38–126)
Anion gap: 9 (ref 5–15)
BUN: 20 mg/dL (ref 8–23)
CO2: 26 mmol/L (ref 22–32)
Calcium: 8.5 mg/dL — ABNORMAL LOW (ref 8.9–10.3)
Chloride: 106 mmol/L (ref 98–111)
Creatinine, Ser: 0.66 mg/dL (ref 0.61–1.24)
GFR, Estimated: >60 mL/min (ref >60)
Glucose, Bld: 114 mg/dL — ABNORMAL HIGH (ref 70–99)
Potassium: 3.1 mmol/L — ABNORMAL LOW (ref 3.5–5.1)
Sodium: 141 mmol/L (ref 135–145)
Total Bilirubin: 0.4 mg/dL (ref 0.3–1.2)
Total Protein: 5.8 g/dL — ABNORMAL LOW (ref 6.5–8.1)

## 2022-11-22 LAB — TYPE AND SCREEN
ABO/RH(D): A POS
Antibody Screen: NEGATIVE

## 2022-11-22 LAB — URINALYSIS, ROUTINE W REFLEX MICROSCOPIC
Bilirubin Urine: NEGATIVE
Glucose, UA: NEGATIVE mg/dL
Ketones, ur: NEGATIVE mg/dL
Leukocytes,Ua: NEGATIVE
Nitrite: NEGATIVE
Protein, ur: 100 mg/dL — AB
RBC / HPF: >50 RBC/hpf (ref 0–5)
Specific Gravity, Urine: 1.02 (ref 1.005–1.030)
pH: 6 (ref 5.0–8.0)

## 2022-11-22 LAB — BRAIN NATRIURETIC PEPTIDE: B Natriuretic Peptide: 2168 pg/mL — ABNORMAL HIGH (ref 0.0–100.0)

## 2022-11-22 MED ORDER — FUROSEMIDE 10 MG/ML IJ SOLN
40.0000 mg | Freq: Two times a day (BID) | INTRAMUSCULAR | Status: DC
  Administered 2022-11-23 – 2022-11-29 (×13): 40 mg via INTRAVENOUS
  Filled 2022-11-22 (×13): qty 4

## 2022-11-22 MED ORDER — ACETAMINOPHEN 650 MG RE SUPP: 650.0000 mg | Freq: Four times a day (QID) | RECTAL | Status: DC | PRN

## 2022-11-22 MED ORDER — PROCHLORPERAZINE EDISYLATE 10 MG/2ML IJ SOLN: 10.0000 mg | Freq: Four times a day (QID) | INTRAMUSCULAR | Status: DC | PRN

## 2022-11-22 MED ORDER — AMIODARONE HCL 200 MG PO TABS
200.0000 mg | ORAL_TABLET | Freq: Every day | ORAL | Status: DC
  Administered 2022-11-23 – 2022-11-29 (×7): 200 mg via ORAL
  Filled 2022-11-22 (×7): qty 1

## 2022-11-22 MED ORDER — ACETAMINOPHEN 325 MG PO TABS
650.0000 mg | ORAL_TABLET | Freq: Four times a day (QID) | ORAL | Status: DC | PRN
  Administered 2022-11-22 – 2022-11-28 (×6): 650 mg via ORAL
  Filled 2022-11-22 (×6): qty 2

## 2022-11-22 MED ORDER — ONDANSETRON HCL 4 MG/2ML IJ SOLN: 4.0000 mg | Freq: Four times a day (QID) | INTRAMUSCULAR | Status: DC | PRN

## 2022-11-22 MED ORDER — FUROSEMIDE 10 MG/ML IJ SOLN
40.0000 mg | Freq: Once | INTRAMUSCULAR | Status: AC
  Administered 2022-11-22: 40 mg via INTRAVENOUS
  Filled 2022-11-22: qty 4

## 2022-11-22 MED ORDER — ENSURE ENLIVE PO LIQD
237.0000 mL | Freq: Two times a day (BID) | ORAL | Status: DC
  Administered 2022-11-23 – 2022-11-29 (×11): 237 mL via ORAL

## 2022-11-22 MED ORDER — LOSARTAN POTASSIUM 25 MG PO TABS
12.5000 mg | ORAL_TABLET | Freq: Every day | ORAL | Status: DC
  Administered 2022-11-23 – 2022-11-29 (×7): 12.5 mg via ORAL
  Filled 2022-11-22 (×7): qty 1

## 2022-11-22 MED ORDER — FOLIC ACID 1 MG PO TABS: 1.0000 mg | ORAL_TABLET | Freq: Every day | ORAL | Status: DC

## 2022-11-22 MED ORDER — SIMVASTATIN 20 MG PO TABS
20.0000 mg | ORAL_TABLET | Freq: Every day | ORAL | Status: DC
  Administered 2022-11-22 – 2022-11-28 (×7): 20 mg via ORAL
  Filled 2022-11-22 (×7): qty 1

## 2022-11-22 MED ORDER — MECLIZINE HCL 12.5 MG PO TABS: 12.5000 mg | ORAL_TABLET | Freq: Every day | ORAL | Status: DC | PRN

## 2022-11-22 MED ORDER — ONDANSETRON HCL 4 MG PO TABS: 4.0000 mg | ORAL_TABLET | Freq: Four times a day (QID) | ORAL | Status: DC | PRN

## 2022-11-22 MED ORDER — POTASSIUM CHLORIDE CRYS ER 20 MEQ PO TBCR
40.0000 meq | EXTENDED_RELEASE_TABLET | Freq: Once | ORAL | Status: AC
  Administered 2022-11-23: 40 meq via ORAL
  Filled 2022-11-22: qty 2

## 2022-11-22 NOTE — ED Provider Notes (Incomplete)
[facility] Provider Note   CSN: [id number] Arrival date & time: 11/22/22  1516     History {Add pertinent medical, surgical, social history, OB history to HPI:1} Chief Complaint  Patient presents with  . Leg Swelling   HPI Craig Fields is a 82 y.o. male with history of stroke, hypertension, hyperlipidemia, CAD, and valvular heart disease presenting for swelling and blood in the urine. Noticed blood in the urine since Saturday. It is obvious and seems to be getting worse per his wife. Wife also mentioned that he seems to be urinating more frequently.  HPI     Home Medications Prior to Admission medications   Medication Sig Start Date End Date Taking? Authorizing Provider  acetaminophen (TYLENOL) 325 MG tablet Take 2 tablets (650 mg total) by mouth every 4 (four) hours as needed for fever, mild pain, moderate pain or headache. 11/01/22   [name], MD  amiodarone (PACERONE) 200 MG tablet Take 2 tablets (400 mg) twice daily until 2/27. Starting 2/28, take one tablet (200 mg) daily 11/01/22   [name], MD  apixaban (ELIQUIS) 5 MG TABS tablet Take 1 tablet (5 mg total) by mouth 2 (two) times daily. 11/01/22   [name], MD  aspirin 81 MG EC tablet Take 81 mg by mouth every evening.    [provider]  Cholecalciferol (VITAMIN D) 2000 units CAPS Take 1 capsule by mouth 2 (two) times daily.    [provider]  Coenzyme Q10 (CO Q-10) 100 MG CAPS Take 2 tablets by mouth daily.    [provider]  lidocaine 4 % Place 1 patch onto the skin daily. Apply to left hip every morning - remove at hs    [provider]  losartan (COZAAR) 25 MG tablet Take 0.5 tablets (12.5 mg total) by mouth daily. 11/02/22   [name], MD  meclizine (ANTIVERT) 12.5 MG tablet Take 12.5 mg by mouth.  May give one hour prior to PT/OT as needed for vertigo thru 11/15/22 & then provider to reassess need. Once A Day - PRN     [provider]  metoprolol succinate (TOPROL-XL) 25 MG 24 hr tablet Take 0.5 tablets (12.5 mg total) by mouth daily. 11/02/22   [name], MD  miconazole (MICOTIN) 2 % cream Apply 1 Application topically 2 (two) times daily.    [provider]  Multiple Vitamins-Minerals ([employer]S SPECTRAVITE) TABS Take 1 tablet by mouth every evening.    [provider]  polyethylene glycol (MIRALAX / GLYCOLAX) 17 g packet Take 17 g by mouth daily.    [provider]  simvastatin (ZOCOR) 20 MG tablet Take 20 mg by mouth at bedtime.    [provider]  UNABLE TO FIND Commode assist chair 01/02/19   Fayrene Helper, MD  UNABLE TO FIND Bilateral new shoes  Left AFO with calliber plate  Dx [id number]   Perlie Mayo, NP  UNABLE TO FIND Right custom torch walker x 1  Dx [id number] 02/04/20   Perlie Mayo, NP      Allergies    Bee venom and Niacin    Review of Systems   Review of Systems  Physical Exam Updated Vital Signs BP (!) 138/58   Pulse 66   Temp 98.7 F (37.1 C) (Oral)   Resp 18   Ht '5\' 10"'$  (1.778 m)   Wt 93.8 kg   SpO2 97%  BMI 29.67 kg/m  Physical Exam  ED Results / Procedures / Treatments   Labs (all labs ordered are listed, but only abnormal results are displayed) Labs Reviewed  URINE CULTURE  COMPREHENSIVE METABOLIC PANEL  CBC WITH DIFFERENTIAL/PLATELET  URINALYSIS, ROUTINE W REFLEX MICROSCOPIC  BRAIN NATRIURETIC PEPTIDE  TYPE AND SCREEN    EKG None  Radiology DG Forearm Left  Result Date: 11/22/2022 CLINICAL DATA:  Trauma arm swelling discoloration EXAM: LEFT FOREARM - 2 VIEW COMPARISON:  None Available. FINDINGS: Osseous demineralization. No definitive fracture or malalignment. Marked soft tissue swelling. Vascular calcifications. No radiopaque foreign body in the soft tissues. IMPRESSION: Osseous demineralization without definitive fracture. Marked soft tissue swelling. Electronically Signed   By: Donavan Foil M.D.    On: 11/22/2022 18:11    Procedures Procedures  {Document cardiac monitor, telemetry assessment procedure when appropriate:1}  Medications Ordered in ED Medications - No data to display  ED Course/ Medical Decision Making/ A&P Clinical Course as of 11/22/22 1931  Mon Nov 22, 2022  1826 He is brought in from [facility] for evaluation of increased edema whole-body along with some hematuria over the last few days.  Patient himself is only complaint is that his butt hurts from the sore.  He was recently in the hospital for incarcerated hernia that was repaired.  Said his appetite has not been great but he is eating.  He does have significant upper and lower extremity edema really even through his abdomen.  Surgical incision well-healed.  Getting labs urinalysis imaging.  Disposition per results of testing. [MB]    Clinical Course User Index [MB] Hayden Rasmussen, MD   {   Click here for ABCD2, HEART and other calculatorsREFRESH Note before signing :1}                          Medical Decision Making Amount and/or Complexity of Data Reviewed Labs: ordered. Radiology: ordered.   ***  {Document critical care time when appropriate:1} {Document review of labs and clinical decision tools ie heart score, Chads2Vasc2 etc:1}  {Document your independent review of radiology images, and any outside records:1} {Document your discussion with family members, caretakers, and with consultants:1} {Document social determinants of health affecting pt's care:1} {Document your decision making why or why not admission, treatments were needed:1} Final Clinical Impression(s) / ED Diagnoses Final diagnoses:  None    Rx / DC Orders ED Discharge Orders     None

## 2022-11-22 NOTE — ED Notes (Signed)
Pt cleanse by NT and male purwick applied.

## 2022-11-22 NOTE — ED Notes (Signed)
Pt talkative.  Daughter at bedside.  No distress

## 2022-11-22 NOTE — ED Notes (Signed)
Difficult IV access. Paramedic now attempting an ultrasound IV

## 2022-11-22 NOTE — ED Triage Notes (Signed)
Pt brought in from the [facility] by ems for c/o bilateral arm swelling and a weight gain of 6lbs in the last week; pt was given lasix with no success

## 2022-11-22 NOTE — H&P (Incomplete)
History and Physical    Patient: Craig Fields [id number] DOB: 20-Sep-1940 DOA: 11/22/2022 DOS: the patient was seen and examined on 11/22/2022 PCP: Fayrene Helper, MD  Patient coming from: [facility]  Chief Complaint:  Chief Complaint  Patient presents with  . Leg Swelling   HPI: Jeyson Bartels Beining is an 82 y.o. male with medical history significant of hypertension, hyperlipidemia, prior CVA with left-sided hemiparesis (on chronic aspirin and Plavix), CAD s/p CABG, A-fib with RVR who presents to the emergency department via EMS from [facility] center due to 2-day onset of blood in the urine and increased swelling of the extremities.  Patient denies any painful urination or any other irritative bladder symptoms.  Apparently, patient was supposed to be of triamterene/HCTZ, but he has not been on this medication since admitted to [facility] center about 3 weeks ago Patient was admitted from 2/9 to 2/19 due to incarcerated hernia and resultant small bowel obstruction (80 cm of necrotic bowel) s/p exploratory laparotomy and ventral hernia repair and bowel resection postoperative course was complicated requiring norepinephrine in spite of volume resuscitation and patient was transferred to [facility] for critical care services where she was intubated due to acute hypoxic respiratory failure secondary to a preoperative aspiration event, she was extubated on 2/11.  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  G2 DD.  Mild elevated PASP, RVSP 39.0 mmHg). He denies chest pain, palpitation, fever, chills, nausea, vomiting.  Patient ambulates with a quad cane, though he has not been ambulating since being admitted to SNF  ED Course:  In the emergency department, he was hemodynamically stable with BP of 136/61 and other vital signs being within normal range.  O2 sat was 100% on room air.  Workup in the ED showed WBC 9.4, hemoglobin 9.8, hematocrit at 32.6, MCV 101.2, platelets 216.  BMP was normal except for  potassium of 3.1, blood glucose 114, albumin 2.8, BNP 2000 168, urinalysis was positive for hematuria. Chest x-ray showed  1. New right upper lobe pulmonary nodule. Recommend chest CT for further evaluation. 2. New bilateral interstitial opacities and probable small bilateral pleural effusions, likely due to pulmonary edema. Left forearm x-ray showed Osseous demineralization without definitive fracture. Marked soft tissue swelling. IV Lasix 40 mg x 1 was given.  Hospitalist was asked to admit patient for further evaluation and management.   Review of Systems: Review of systems as noted in the HPI. All other systems reviewed and are negative.   Past Medical History:  Diagnosis Date  . Anxiety disorder   . Chronic back pain   . Colon cancer ([place]) 01/2006  . Coronary atherosclerosis of native coronary artery    Multivessel status post CABG  . CVA, old, hemiparesis ([place]) 01/2002   Left side   . Essential hypertension   . Hematuria 04/30/2019   New complaint, does nott have pain of kidney stones, needs to submit UA for testing asap and needs urology eval  . Hyperlipidemia   . IGT (impaired glucose tolerance)   . Obesity   . OSA (obstructive sleep apnea)   . Seizures ([place])    Age 1 or 3, no meds and no reoccurances  . Skin lesion 12/07/2012  . Skin tag 11/16/2011  . Stroke ([place])   . Umbilical hernia    Past Surgical History:  Procedure Laterality Date  . BOWEL RESECTION N/A 10/22/2022   Procedure: SMALL BOWEL RESECTION;  Surgeon: Rusty Aus, DO;  Location: [facility];  Service: General;  Laterality: N/A;  . COLONOSCOPY N/A 07/15/2015   Procedure: COLONOSCOPY;  Surgeon: Aviva Signs, MD;  Location: [facility];  Service: Gastroenterology;  Laterality: N/A;  . CORONARY ARTERY BYPASS GRAFT  09/13/2002   x4  . HERNIA REPAIR    . INCISIONAL HERNIA REPAIR N/A 01/01/2013   Procedure: HERNIA REPAIR INCISIONAL;  Surgeon: Jamesetta So, MD;  Location: [facility];  Service: General;   Laterality: N/A;  umbilical area  . INSERTION OF MESH N/A 01/01/2013   Procedure: INSERTION OF MESH;  Surgeon: Jamesetta So, MD;  Location: [facility];  Service: General;  Laterality: N/A;  . LAPAROTOMY N/A 10/22/2022   Procedure: EXPLORATORY LAPAROTOMY;  Surgeon: Rusty Aus, DO;  Location: [facility];  Service: General;  Laterality: N/A;  . Left inguinal  hernia repair  09/14/2003  . OMENTECTOMY N/A 10/22/2022   Procedure: OMENTECTOMY;  Surgeon: Rusty Aus, DO;  Location: [facility];  Service: General;  Laterality: N/A;  . [name]  09/13/2005  . VENTRAL HERNIA REPAIR N/A 10/22/2022   Procedure: HERNIA REPAIR VENTRAL ADULT;  Surgeon: Rusty Aus, DO;  Location: [facility];  Service: General;  Laterality: N/A;    Social History:  reports that he quit smoking about 41 years ago. His smoking use included cigarettes. He has a 2.50 pack-year smoking history. He quit smokeless tobacco use about 9 years ago.  His smokeless tobacco use included chew. He reports that he does not drink alcohol and does not use drugs.   Allergies  Allergen Reactions  . Bee Venom Swelling  . Niacin Other (See Comments)    Muscle aches    Family History  Problem Relation Age of Onset  . Pancreatic cancer Mother   . Heart disease Father   . Prostate cancer Father   . Stroke Brother   . Heart disease Brother   . Heart disease Brother   . COPD Sister   . Actinic keratosis Sister   . Obesity Son   . Anxiety disorder Son       Prior to Admission medications   Medication Sig Start Date End Date Taking? Authorizing Provider  acetaminophen (TYLENOL) 325 MG tablet Take 2 tablets (650 mg total) by mouth every 4 (four) hours as needed for fever, mild pain, moderate pain or headache. 11/01/22   [name], MD  amiodarone (PACERONE) 200 MG tablet Take 2 tablets (400 mg) twice daily until 2/27. Starting 2/28, take one tablet (200 mg) daily 11/01/22   [name], MD  apixaban (ELIQUIS)  5 MG TABS tablet Take 1 tablet (5 mg total) by mouth 2 (two) times daily. 11/01/22   [name], MD  aspirin 81 MG EC tablet Take 81 mg by mouth every evening.    [provider]  Cholecalciferol (VITAMIN D) 2000 units CAPS Take 1 capsule by mouth 2 (two) times daily.    [provider]  Coenzyme Q10 (CO Q-10) 100 MG CAPS Take 2 tablets by mouth daily.    [provider]  lidocaine 4 % Place 1 patch onto the skin daily. Apply to left hip every morning - remove at hs    [provider]  losartan (COZAAR) 25 MG tablet Take 0.5 tablets (12.5 mg total) by mouth daily. 11/02/22   [name], MD  meclizine (ANTIVERT) 12.5 MG tablet Take 12.5 mg by mouth.  May give one hour prior to PT/OT as needed for vertigo thru 11/15/22 & then  provider to reassess need. Once A Day - PRN    [provider]  metoprolol succinate (TOPROL-XL) 25 MG 24 hr tablet Take 0.5 tablets (12.5 mg total) by mouth daily. 11/02/22   [name], MD  miconazole (MICOTIN) 2 % cream Apply 1 Application topically 2 (two) times daily.    [provider]  Multiple Vitamins-Minerals ([employer] SPECTRAVITE) TABS Take 1 tablet by mouth every evening.    [provider]  polyethylene glycol (MIRALAX / GLYCOLAX) 17 g packet Take 17 g by mouth daily.    [provider]  simvastatin (ZOCOR) 20 MG tablet Take 20 mg by mouth at bedtime.    [provider]  UNABLE TO FIND Commode assist chair 01/02/19   Fayrene Helper, MD  UNABLE TO FIND Bilateral new shoes  Left AFO with calliber plate  Dx [id number]   Perlie Mayo, NP  UNABLE TO FIND Right custom torch walker x 1  Dx [id number] 02/04/20   Perlie Mayo, NP    Physical Exam: BP (!) 145/60 (BP Location: Left Arm)   Pulse 68   Temp (!) 97.3 F (36.3 C) (Oral)   Resp 20   Ht '5\' 10"'$  (1.778 m)   Wt 93.8 kg   SpO2 99%   BMI 29.67 kg/m   General: 82 y.o. year-old male well developed well nourished in no  acute distress.  Alert and oriented x3. HEENT: NCAT, EOMI Neck: Supple, trachea medial Cardiovascular: Regular rate and rhythm with no rubs or gallops.  + JVD distention noted.  Bilateral extremity edema in both upper and lower extremities. 2/4 pulses in all 4 extremities. Respiratory: Bilateral Rales in lower lobes.  No wheezes.   Abdomen: Soft, nontender nondistended with normal bowel sounds x4 quadrants. Muskuloskeletal: No cyanosis or clubbing noted bilaterally Neuro: CN II-XII intact, left-sided weakness Skin: Pressure ulcer noted on coccyx. No rashes Psychiatry: Mood is appropriate for condition and setting          Labs on Admission:  Basic Metabolic Panel: Recent Labs  Lab 11/22/22 1800  NA 141  K 3.1*  CL 106  CO2 26  GLUCOSE 114*  BUN 20  CREATININE 0.66  CALCIUM 8.5*   Liver Function Tests: Recent Labs  Lab 11/22/22 1800  AST 16  ALT 14  ALKPHOS 69  BILITOT 0.4  PROT 5.8*  ALBUMIN 2.8*   No results for input(s): "LIPASE", "AMYLASE" in the last 168 hours. No results for input(s): "AMMONIA" in the last 168 hours. CBC: Recent Labs  Lab 11/22/22 1800  WBC 9.4  NEUTROABS 7.2  HGB 9.8*  HCT 32.6*  MCV 101.2*  PLT 216   Cardiac Enzymes: No results for input(s): "CKTOTAL", "CKMB", "CKMBINDEX", "TROPONINI" in the last 168 hours.  BNP (last 3 results) Recent Labs    11/22/22 1800  BNP 2,168.0*    ProBNP (last 3 results) No results for input(s): "PROBNP" in the last 8760 hours.  CBG: No results for input(s): "GLUCAP" in the last 168 hours.  Radiological Exams on Admission: DG Chest Portable 1 View  Result Date: 11/22/2022 CLINICAL DATA:  Lower extremity edema EXAM: PORTABLE CHEST 1 VIEW COMPARISON:  Chest x-ray dated Jan 22, 2023 FINDINGS: Cardiac and mediastinal contours are unchanged. New bilateral interstitial opacities. New nodular opacity of the right upper lobe. Improved aeration of the lung bases when compared with prior radiograph.  Probable new small bilateral pleural effusions. No evidence of pneumothorax. IMPRESSION: 1. New right upper lobe pulmonary  nodule. Recommend chest CT for further evaluation. 2. New bilateral interstitial opacities and probable small bilateral pleural effusions, likely due to pulmonary edema. Electronically Signed   By: Yetta Glassman M.D.   On: 11/22/2022 19:10   DG Forearm Left  Result Date: 11/22/2022 CLINICAL DATA:  Trauma arm swelling discoloration EXAM: LEFT FOREARM - 2 VIEW COMPARISON:  None Available. FINDINGS: Osseous demineralization. No definitive fracture or malalignment. Marked soft tissue swelling. Vascular calcifications. No radiopaque foreign body in the soft tissues. IMPRESSION: Osseous demineralization without definitive fracture. Marked soft tissue swelling. Electronically Signed   By: Donavan Foil M.D.   On: 11/22/2022 18:11    EKG: I independently viewed the EKG done and my findings are as followed: Normal sinus rhythm at rate of 65 bpm with prolonged QTc 624 ms  Assessment/Plan Present on Admission: **None**  Principal Problem:   Acute exacerbation of CHF (congestive heart failure) (HCC)  Acute on chronic combined systolic and diastolic CHF Elevated BNP (2,168) Chest x-ray was suggestive of pulmonary edema Continue total input/output, daily weights and fluid restriction Continue IV Lasix 40 mg twice daily Continue heart healthy diet  Echocardiogram done on 10/24/2022 showed EF of 20 to 25%.  G2 DD.  Mild elevated PASP, RVSP 39.0 mmHg.  Echocardiogram will be done in the morning   Hematuria Patient denies any related to blood symptoms Urinalysis was unimpressive for UTI Urology will be consulted and we shall await further recommendation  Pulmonary nodule Chest x-ray showed new right upper lobe pulmonary nodule CT chest recommended  Megaloblastic anemia H/H 99991111, MCV 99991111 Folic acid and vitamin B12 levels will be checked  Hypokalemia K+ 3.1, this will be  replenished  Hypoalbuminemia possibly secondary to moderate protein calorie malnutrition Albumin 2.8; protein supplement to be provided  Prolonged QT interval QTc 624 ms Avoid QT prolonging drugs Magnesium level will be checked  HFrEF PAF with RVR     DVT prophylaxis: SCDs   Advance Care Planning: Full code  Consults: Urology  Family Communication: Daughter at bedside (all questions answered to satisfaction)  Severity of Illness: The appropriate patient status for this patient is INPATIENT. Inpatient status is judged to be reasonable and necessary in order to provide the required intensity of service to ensure the patient's safety. [name] presenting symptoms, physical exam findings, and initial radiographic and laboratory data in the context of their chronic comorbidities is felt to place them at high risk for further clinical deterioration. Furthermore, it is not anticipated that the patient will be medically stable for discharge from the hospital within 2 midnights of admission.   * I certify that at the point of admission it is my clinical judgment that the patient will require inpatient hospital care spanning beyond 2 midnights from the point of admission due to high intensity of service, high risk for further deterioration and high frequency of surveillance required.*  Author: Bernadette Hoit, DO 11/22/2022 8:39 PM  For on call review [email or web address].

## 2022-11-22 NOTE — H&P (Addendum)
History and Physical    Patient: Craig Fields [id number] DOB: [birth year] DOA: 11/22/2022 DOS: the patient was seen and examined on 11/23/2022 PCP: Fayrene Helper, MD  Patient coming from: [facility]  Chief Complaint:  Chief Complaint  Patient presents with   Leg Swelling   HPI: Craig Fields K[place]ose is an 82 y.o. male with medical history significant of hypertension, hyperlipidemia, prior CVA with left-sided hemiparesis (on chronic aspirin and Plavix), CAD s/p CABG, A-fib with RVR who presents to the emergency department via EMS from [facility] center due to 2-day onset of blood in the urine and increased swelling of the extremities.  Patient denies any painful urination or any other irritative bladder symptoms.  Apparently, patient was supposed to be of triamterene/HCTZ, but he has not been on this medication since admitted to [facility] center about 3 weeks ago Patient was admitted from 2/9 to 2/19 due to incarcerated hernia and resultant small bowel obstruction (80 cm of necrotic bowel) s/p exploratory laparotomy and ventral hernia repair and bowel resection postoperative course was complicated requiring norepinephrine in spite of volume resuscitation and patient was transferred to [facility] for critical care services where she was intubated due to acute hypoxic respiratory failure secondary to a preoperative aspiration event, she was extubated on 2/11.  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  G2 DD.  Mild elevated PASP, RVSP 39.0 mmHg). He denies chest pain, palpitation, fever, chills, nausea, vomiting.  Patient ambulates with a quad cane, though he has not been ambulating since being admitted to SNF  ED Course:  In the emergency department, he was hemodynamically stable with BP of 136/61 and other vital signs being within normal range.  O2 sat was 100% on room air.  Workup in the ED showed WBC 9.4, hemoglobin 9.8, hematocrit at 32.6, MCV 101.2, platelets 216.  BMP was normal except for  potassium of 3.1, blood glucose 114, albumin 2.8, BNP 2000 168, urinalysis was positive for hematuria. Chest x-ray showed  1. New right upper lobe pulmonary nodule. Recommend chest CT for further evaluation. 2. New bilateral interstitial opacities and probable small bilateral pleural effusions, likely due to pulmonary edema. Left forearm x-ray showed Osseous demineralization without definitive fracture. Marked soft tissue swelling. IV Lasix 40 mg x 1 was given.  Hospitalist was asked to admit patient for further evaluation and management.   Review of Systems: Review of systems as noted in the HPI. All other systems reviewed and are negative.   Past Medical History:  Diagnosis Date   Anxiety disorder    Chronic back pain    Colon cancer ([place]) 01/2006   Coronary atherosclerosis of native coronary artery    Multivessel status post CABG   CVA, old, hemiparesis ([place]) 01/2002   Left side    Essential hypertension    Hematuria 04/30/2019   New complaint, does nott have pain of kidney stones, needs to submit UA for testing asap and needs urology eval   Hyperlipidemia    IGT (impaired glucose tolerance)    Obesity    OSA (obstructive sleep apnea)    Seizures ([place])    Age 33 or 3, no meds and no reoccurances   Skin lesion 12/07/2012   Skin tag 11/16/2011   Stroke [facility])    Umbilical hernia    Past Surgical History:  Procedure Laterality Date   BOWEL RESECTION N/A 10/22/2022   Procedure: SMALL BOWEL RESECTION;  Surgeon: Rusty Aus, DO;  Location: [facility] ORS;  Service: General;  Laterality: N/A;   COLONOSCOPY N/A 07/15/2015   Procedure: COLONOSCOPY;  Surgeon: Aviva Signs, MD;  Location: [facility] ENDO SUITE;  Service: Gastroenterology;  Laterality: N/A;   CORONARY ARTERY BYPASS GRAFT  09/13/2002   x4   HERNIA REPAIR     INCISIONAL HERNIA REPAIR N/A 01/01/2013   Procedure: HERNIA REPAIR INCISIONAL;  Surgeon: Jamesetta So, MD;  Location: [facility] ORS;  Service: General;  Laterality: N/A;   umbilical area   INSERTION OF MESH N/A 01/01/2013   Procedure: INSERTION OF MESH;  Surgeon: Jamesetta So, MD;  Location: [facility] ORS;  Service: General;  Laterality: N/A;   LAPAROTOMY N/A 10/22/2022   Procedure: EXPLORATORY LAPAROTOMY;  Surgeon: Rusty Aus, DO;  Location: [facility] ORS;  Service: General;  Laterality: N/A;   Left inguinal  hernia repair  09/14/2003   OMENTECTOMY N/A 10/22/2022   Procedure: OMENTECTOMY;  Surgeon: Rusty Aus, DO;  Location: [facility] ORS;  Service: General;  Laterality: N/A;   [name]  09/13/2005   VENTRAL HERNIA REPAIR N/A 10/22/2022   Procedure: HERNIA REPAIR VENTRAL ADULT;  Surgeon: Rusty Aus, DO;  Location: [facility] ORS;  Service: General;  Laterality: N/A;    Social History:  reports that he quit smoking about 41 years ago. His smoking use included cigarettes. He has a 2.50 pack-year smoking history. He quit smokeless tobacco use about 9 years ago.  His smokeless tobacco use included chew. He reports that he does not drink alcohol and does not use drugs.   Allergies  Allergen Reactions   Bee Venom Swelling   Niacin Other (See Comments)    Muscle aches    Family History  Problem Relation Age of Onset   Pancreatic cancer Mother    Heart disease Father    Prostate cancer Father    Stroke Brother    Heart disease Brother    Heart disease Brother    COPD Sister    Actinic keratosis Sister    Obesity Son    Anxiety disorder Son       Prior to Admission medications   Medication Sig Start Date End Date Taking? Authorizing Provider  acetaminophen (TYLENOL) 325 MG tablet Take 2 tablets (650 mg total) by mouth every 4 (four) hours as needed for fever, mild pain, moderate pain or headache. 11/01/22   [name], MD  amiodarone (PACERONE) 200 MG tablet Take 2 tablets (400 mg) twice daily until 2/27. Starting 2/28, take one tablet (200 mg) daily 11/01/22   [name], MD  apixaban (ELIQUIS) 5 MG TABS tablet Take 1 tablet (5 mg  total) by mouth 2 (two) times daily. 11/01/22   [name], MD  aspirin 81 MG EC tablet Take 81 mg by mouth every evening.    [provider]  Cholecalciferol (VITAMIN D) 2000 units CAPS Take 1 capsule by mouth 2 (two) times daily.    [provider]  Coenzyme Q10 (CO Q-10) 100 MG CAPS Take 2 tablets by mouth daily.    [provider]  lidocaine 4 % Place 1 patch onto the skin daily. Apply to left hip every morning - remove at hs    [provider]  losartan (COZAAR) 25 MG tablet Take 0.5 tablets (12.5 mg total) by mouth daily. 11/02/22   [name], MD  meclizine (ANTIVERT) 12.5 MG tablet Take 12.5 mg by mouth.  May give one hour prior to PT/OT as needed for vertigo thru 11/15/22 & then  provider to reassess need. Once A Day - PRN    [provider]  metoprolol succinate (TOPROL-XL) 25 MG 24 hr tablet Take 0.5 tablets (12.5 mg total) by mouth daily. 11/02/22   [name], MD  miconazole (MICOTIN) 2 % cream Apply 1 Application topically 2 (two) times daily.    [provider]  Multiple Vitamins-Minerals ([employer] SPECTRAVITE) TABS Take 1 tablet by mouth every evening.    [provider]  polyethylene glycol (MIRALAX / GLYCOLAX) 17 g packet Take 17 g by mouth daily.    [provider]  simvastatin (ZOCOR) 20 MG tablet Take 20 mg by mouth at bedtime.    [provider]  UNABLE TO FIND Commode assist chair 01/02/19   Fayrene Helper, MD  UNABLE TO FIND Bilateral new shoes  Left AFO with calliber plate  Dx [id number]   Perlie Mayo, NP  UNABLE TO FIND Right custom torch walker x 1  Dx [id number] 02/04/20   Perlie Mayo, NP    Physical Exam: BP (!) 145/60 (BP Location: Left Arm)   Pulse 68   Temp (!) 97.3 F (36.3 C) (Oral)   Resp 20   Ht '5\' 10"'$  (1.778 m)   Wt 93.8 kg   SpO2 99%   BMI 29.67 kg/m   General: 82 y.o. year-old male well developed well nourished in no acute distress.  Alert and oriented  x3. HEENT: NCAT, EOMI Neck: Supple, trachea medial Cardiovascular: Regular rate and rhythm with no rubs or gallops.  + JVD distention noted.  Bilateral extremity edema in both upper and lower extremities. 2/4 pulses in all 4 extremities. Respiratory: Bilateral Rales in lower lobes.  No wheezes.   Abdomen: Soft, nontender nondistended with normal bowel sounds x4 quadrants. Muskuloskeletal: No cyanosis or clubbing noted bilaterally Neuro: CN II-XII intact, left-sided weakness Skin: Pressure ulcer noted on coccyx. No rashes Psychiatry: Mood is appropriate for condition and setting          Labs on Admission:  Basic Metabolic Panel: Recent Labs  Lab 11/22/22 1800  NA 141  K 3.1*  CL 106  CO2 26  GLUCOSE 114*  BUN 20  CREATININE 0.66  CALCIUM 8.5*   Liver Function Tests: Recent Labs  Lab 11/22/22 1800  AST 16  ALT 14  ALKPHOS 69  BILITOT 0.4  PROT 5.8*  ALBUMIN 2.8*   No results for input(s): "LIPASE", "AMYLASE" in the last 168 hours. No results for input(s): "AMMONIA" in the last 168 hours. CBC: Recent Labs  Lab 11/22/22 1800  WBC 9.4  NEUTROABS 7.2  HGB 9.8*  HCT 32.6*  MCV 101.2*  PLT 216   Cardiac Enzymes: No results for input(s): "CKTOTAL", "CKMB", "CKMBINDEX", "TROPONINI" in the last 168 hours.  BNP (last 3 results) Recent Labs    11/22/22 1800  BNP 2,168.0*    ProBNP (last 3 results) No results for input(s): "PROBNP" in the last 8760 hours.  CBG: No results for input(s): "GLUCAP" in the last 168 hours.  Radiological Exams on Admission: DG Chest Portable 1 View  Result Date: 11/22/2022 CLINICAL DATA:  Lower extremity edema EXAM: PORTABLE CHEST 1 VIEW COMPARISON:  Chest x-ray dated Jan 22, 2023 FINDINGS: Cardiac and mediastinal contours are unchanged. New bilateral interstitial opacities. New nodular opacity of the right upper lobe. Improved aeration of the lung bases when compared with prior radiograph. Probable new small bilateral pleural  effusions. No evidence of pneumothorax. IMPRESSION: 1. New right upper lobe pulmonary  nodule. Recommend chest CT for further evaluation. 2. New bilateral interstitial opacities and probable small bilateral pleural effusions, likely due to pulmonary edema. Electronically Signed   By: Yetta Glassman M.D.   On: 11/22/2022 19:10   DG Forearm Left  Result Date: 11/22/2022 CLINICAL DATA:  Trauma arm swelling discoloration EXAM: LEFT FOREARM - 2 VIEW COMPARISON:  None Available. FINDINGS: Osseous demineralization. No definitive fracture or malalignment. Marked soft tissue swelling. Vascular calcifications. No radiopaque foreign body in the soft tissues. IMPRESSION: Osseous demineralization without definitive fracture. Marked soft tissue swelling. Electronically Signed   By: Donavan Foil M.D.   On: 11/22/2022 18:11    EKG: I independently viewed the EKG done and my findings are as followed: Normal sinus rhythm at rate of 65 bpm with prolonged QTc 624 ms  Assessment/Plan Present on Admission:  Hematuria  Hyperlipidemia LDL goal <70  Essential hypertension, benign  Principal Problem:   Acute exacerbation of CHF (congestive heart failure) (HCC) Active Problems:   Hyperlipidemia LDL goal <70   Essential hypertension, benign   OSA on CPAP   Hematuria   Megaloblastic anemia   Hypokalemia   Hypoalbuminemia due to protein-calorie malnutrition (HCC)   Prolonged QT interval   Paroxysmal atrial tachycardia   History of CVA (cerebrovascular accident)  Acute on chronic combined systolic and diastolic CHF Elevated BNP (2,168) Chest x-ray was suggestive of pulmonary edema Continue total input/output, daily weights and fluid restriction Continue IV Lasix 40 mg twice daily Continue losartan, metoprolol, aspirin, Zocor Continue heart healthy diet  Echocardiogram done on 10/24/2022 showed EF of 20 to 25%.  G2 DD.  Mild elevated PASP, RVSP 39.0 mmHg.  Echocardiogram will be done in the morning    Hematuria Patient denies any related irritative bladder symptoms Urinalysis was unimpressive for UTI Patient appears to have presented with dark urine during prior admission at [facility] and there was suspicion for colovesicular fistula at that time.  CT abdomen and pelvis will be done Consider urology consult for persistent hematuria.    Pulmonary nodule Chest x-ray showed new right upper lobe pulmonary nodule CT chest recommended  Megaloblastic anemia H/H 99991111, MCV 99991111 Folic acid and vitamin B12 levels will be checked  Hypokalemia K+ 3.1, this will be replenished  Hypoalbuminemia possibly secondary to moderate protein calorie malnutrition Albumin 2.8; protein supplement to be provided  Prolonged QT interval QTc 624 ms Avoid QT prolonging drugs Magnesium level will be checked  Paroxysmal atrial fibrillation Patient is currently in normal sinus rhythm Continue metoprolol Amiodarone will be held at this time due to prolonged QTc Eliquis will be held at this time due to hematuria  History of CVA with left hemiparesis Continue aspirin, Zocor Continue fall precaution Continue PT/OT eval and treat  OSA Continue CPAP qHS  Essential hypertension Continue metoprolol, losartan  Mixed hyperlipidemia Continue Zocor   DVT prophylaxis: SCDs   Advance Care Planning: Full code  Consults: None  Family Communication: Daughter at bedside (all questions answered to satisfaction)  Severity of Illness: The appropriate patient status for this patient is INPATIENT. Inpatient status is judged to be reasonable and necessary in order to provide the required intensity of service to ensure the patient's safety. [name] presenting symptoms, physical exam findings, and initial radiographic and laboratory data in the context of their chronic comorbidities is felt to place them at high risk for further clinical deterioration. Furthermore, it is not anticipated that the patient  will be medically stable for discharge from the hospital within  2 midnights of admission.   * I certify that at the point of admission it is my clinical judgment that the patient will require inpatient hospital care spanning beyond 2 midnights from the point of admission due to high intensity of service, high risk for further deterioration and high frequency of surveillance required.*  Author: Bernadette Hoit, DO 11/23/2022 12:20 AM  For on call review [email or web address].

## 2022-11-22 NOTE — ED Notes (Signed)
Wife at bedside.  Pt turned and repositioned.

## 2022-11-22 NOTE — ED Notes (Signed)
Report rec'd from CN

## 2022-11-22 NOTE — ED Notes (Signed)
Hospitalist in to see pt.

## 2022-11-22 NOTE — Progress Notes (Signed)
Patient arrived to room 333 from ED.  Assessment complete, VS obtained, and Admission database began.

## 2022-11-22 NOTE — Progress Notes (Signed)
Attending notified of patient arrival.

## 2022-11-23 ENCOUNTER — Inpatient Hospital Stay (HOSPITAL_COMMUNITY): Payer: Medicare Other

## 2022-11-23 ENCOUNTER — Other Ambulatory Visit (HOSPITAL_COMMUNITY): Payer: Self-pay | Admitting: *Deleted

## 2022-11-23 DIAGNOSIS — I10 Essential (primary) hypertension: Secondary | ICD-10-CM | POA: Diagnosis not present

## 2022-11-23 DIAGNOSIS — I5043 Acute on chronic combined systolic (congestive) and diastolic (congestive) heart failure: Secondary | ICD-10-CM | POA: Diagnosis not present

## 2022-11-23 DIAGNOSIS — I4719 Other supraventricular tachycardia: Secondary | ICD-10-CM | POA: Insufficient documentation

## 2022-11-23 DIAGNOSIS — I5021 Acute systolic (congestive) heart failure: Secondary | ICD-10-CM | POA: Diagnosis not present

## 2022-11-23 DIAGNOSIS — Z8673 Personal history of transient ischemic attack (TIA), and cerebral infarction without residual deficits: Secondary | ICD-10-CM

## 2022-11-23 LAB — COMPREHENSIVE METABOLIC PANEL
ALT: 13 U/L (ref 0–44)
AST: 18 U/L (ref 15–41)
Albumin: 2.7 g/dL — ABNORMAL LOW (ref 3.5–5.0)
Alkaline Phosphatase: 69 U/L (ref 38–126)
Anion gap: 7 (ref 5–15)
BUN: 19 mg/dL (ref 8–23)
CO2: 28 mmol/L (ref 22–32)
Calcium: 8.3 mg/dL — ABNORMAL LOW (ref 8.9–10.3)
Chloride: 105 mmol/L (ref 98–111)
Creatinine, Ser: 0.69 mg/dL (ref 0.61–1.24)
GFR, Estimated: >60 mL/min (ref >60)
Glucose, Bld: 108 mg/dL — ABNORMAL HIGH (ref 70–99)
Potassium: 2.9 mmol/L — ABNORMAL LOW (ref 3.5–5.1)
Sodium: 140 mmol/L (ref 135–145)
Total Bilirubin: 0.8 mg/dL (ref 0.3–1.2)
Total Protein: 6 g/dL — ABNORMAL LOW (ref 6.5–8.1)

## 2022-11-23 LAB — ECHOCARDIOGRAM LIMITED
Calc EF: 34.8 %
Height: 70 in
S' Lateral: 5.6 cm
Single Plane A2C EF: 38.4 %
Single Plane A4C EF: 30.8 %
Weight: 3308.66 oz

## 2022-11-23 LAB — CBC
HCT: 34 % — ABNORMAL LOW (ref 39.0–52.0)
Hemoglobin: 10.3 g/dL — ABNORMAL LOW (ref 13.0–17.0)
MCH: 30.7 pg (ref 26.0–34.0)
MCHC: 30.3 g/dL (ref 30.0–36.0)
MCV: 101.2 fL — ABNORMAL HIGH (ref 80.0–100.0)
Platelets: 196 10*3/uL (ref 150–400)
RBC: 3.36 MIL/uL — ABNORMAL LOW (ref 4.22–5.81)
RDW: 16.3 % — ABNORMAL HIGH (ref 11.5–15.5)
WBC: 8 10*3/uL (ref 4.0–10.5)
nRBC: 0 % (ref 0.0–0.2)

## 2022-11-23 LAB — URINE CULTURE

## 2022-11-23 LAB — VITAMIN B12: Vitamin B-12: 462 pg/mL (ref 180–914)

## 2022-11-23 LAB — PHOSPHORUS: Phosphorus: 3.2 mg/dL (ref 2.5–4.6)

## 2022-11-23 LAB — FOLATE: Folate: 24.3 ng/mL (ref >5.9)

## 2022-11-23 LAB — MAGNESIUM: Magnesium: 1.8 mg/dL (ref 1.7–2.4)

## 2022-11-23 MED ORDER — IOHEXOL 300 MG/ML  SOLN
125.0000 mL | Freq: Once | INTRAMUSCULAR | Status: AC | PRN
  Administered 2022-11-23: 125 mL via INTRAVENOUS

## 2022-11-23 MED ORDER — PERFLUTREN LIPID MICROSPHERE
1.0000 mL | INTRAVENOUS | Status: DC | PRN
  Administered 2022-11-23: 3 mL via INTRAVENOUS

## 2022-11-23 MED ORDER — METOPROLOL SUCCINATE ER 25 MG PO TB24
12.5000 mg | ORAL_TABLET | Freq: Every day | ORAL | Status: DC
  Administered 2022-11-23 – 2022-11-26 (×4): 12.5 mg via ORAL
  Filled 2022-11-23 (×4): qty 1

## 2022-11-23 MED ORDER — ASPIRIN 81 MG PO TBEC
81.0000 mg | DELAYED_RELEASE_TABLET | Freq: Every evening | ORAL | Status: DC
  Administered 2022-11-23 – 2022-11-28 (×6): 81 mg via ORAL
  Filled 2022-11-23 (×6): qty 1

## 2022-11-23 NOTE — Progress Notes (Signed)
*  PRELIMINARY RESULTS* Echocardiogram Limited 2-D Echocardiogram  has been performed with Definity.  Craig Fields 11/23/2022, 2:54 PM

## 2022-11-23 NOTE — TOC Progression Note (Signed)
  Transition of Care [facility]) Screening Note   Patient Details  Name: Craig Fields Date of Birth: 1941/08/10   Transition of Care [facility]) CM/SW Contact:    Boneta Lucks, RN Phone Number: 11/23/2022, 1:07 PM  From [facility], TOC following  Transition of Care Department [facility]) has reviewed patient and no TOC needs have been identified at this time. We will continue to monitor patient advancement through interdisciplinary progression rounds. If new patient transition needs arise, please place a TOC consult.    Expected Discharge Plan: [place] Barriers to Discharge: Continued Medical Work up  Expected Discharge Plan and Services     Social Determinants of Health (SDOH) Interventions SDOH Screenings   Food Insecurity: No Food Insecurity (11/23/2022)  Housing: Low Risk  (11/23/2022)  Transportation Needs: No Transportation Needs (11/23/2022)  Utilities: Not At Risk (11/23/2022)  Alcohol Screen: Low Risk  (06/28/2022)  Depression (PHQ2-9): Low Risk  (07/02/2022)  Financial Resource Strain: Low Risk  (06/28/2022)  Physical Activity: Inactive (06/28/2022)  Social Connections: Moderately Integrated (06/28/2022)  Stress: No Stress Concern Present (06/28/2022)  Tobacco Use: Medium Risk (11/22/2022)

## 2022-11-23 NOTE — Progress Notes (Signed)
RT went to setup CPAP per MD order, talked with daughter, "states patient does not wear CPAP at home, tried 20 years ago, and haven't done it since" RT informed that MD has ordered CPAP  daughter states "his oxygen has been good and he's ok". Machine is available if family changes their mind. Nurse informed

## 2022-11-23 NOTE — Progress Notes (Signed)
Patient continues to decline CPAP at this time.Patient saturations on room air were 95% and he's been in no de stress. RT has a unit on stand by for patient if and when needed.

## 2022-11-23 NOTE — Plan of Care (Signed)
Patient had uneventful day, repositioned Q2 hours in bed, appetite good ate 75% of meals and supplements, incontinence care rendered twice, malewick in place, call bell within reach.    Problem: Education: Goal: Knowledge of General Education information will improve Description: Including pain rating scale, medication(s)/side effects and non-pharmacologic comfort measures Outcome: Progressing   Problem: Health Behavior/Discharge Planning: Goal: Ability to manage health-related needs will improve Outcome: Progressing   Problem: Clinical Measurements: Goal: Ability to maintain clinical measurements within normal limits will improve Outcome: Progressing Goal: Will remain free from infection Outcome: Progressing Goal: Diagnostic test results will improve Outcome: Progressing Goal: Respiratory complications will improve Outcome: Progressing Goal: Cardiovascular complication will be avoided Outcome: Progressing   Problem: Activity: Goal: Risk for activity intolerance will decrease Outcome: Progressing   Problem: Nutrition: Goal: Adequate nutrition will be maintained Outcome: Progressing   Problem: Coping: Goal: Level of anxiety will decrease Outcome: Progressing   Problem: Elimination: Goal: Will not experience complications related to bowel motility Outcome: Progressing Goal: Will not experience complications related to urinary retention Outcome: Progressing   Problem: Pain Managment: Goal: General experience of comfort will improve Outcome: Progressing   Problem: Safety: Goal: Ability to remain free from injury will improve Outcome: Progressing   Problem: Skin Integrity: Goal: Risk for impaired skin integrity will decrease Outcome: Progressing

## 2022-11-23 NOTE — Progress Notes (Signed)
PROGRESS NOTE     [name], is a 82 y.o. male, DOB - 1941/01/12, [id number]  Admit date - 11/22/2022   Admitting Physician Bernadette Hoit, DO  Outpatient Primary MD for the patient is Fayrene Helper, MD  LOS - 1  Chief Complaint  Patient presents with   Leg Swelling        Brief Narrative:   82 y.o. male with medical history significant of hypertension, hyperlipidemia, prior CVA with left-sided hemiparesis (on chronic aspirin and Plavix), CAD s/p CABG, A-fib with RVR  admitted on 11/22/2022 from SNF facility with CHF exacerbation    -Assessment and Plan:  1)Acute on chronic combined systolic and diastolic CHF- Elevated BNP (2,168) Updated echo with EF of 20 -25 %- Prior Echocardiogram done on 10/24/2022 showed EF of 20 to 25%.  G2 DD.  Mild elevated PASP, RVSP 39.0 mmHg.    Chest x-ray was suggestive of pulmonary edema Continue total input/output, daily weights and fluid restriction Continue IV Lasix 40 mg twice daily Continue losartan, metoprolol, aspirin, Zocor Continue heart healthy diet      -Intake/Output Summary (Last 24 hours) at 11/23/2022 0946 Last data filed at 11/23/2022 0600 Gross per 24 hour  Intake --  Output 1400 ml  Net -1400 ml    2)Hematuria/subcapsular fluid collection on the left kidney/bladder stone Patient denies any related irritative bladder symptoms Urinalysis was unimpressive for UTI -CT hematuria on 11/23/22 shows new calcification is seen bladder measuring 8 mm, likely a bladder calculus. - Unchanged subcapsular fluid collection of the left kidney, likely a hematoma secondary to a ruptured cyst. Recommend follow-up renal protocol CT or MRI in 3 months for further evaluation. -Consult from urologist Dr. Alyson Ingles requested   Pulmonary nodule Chest x-ray showed new right upper lobe pulmonary nodule CT chest recommended   Megaloblastic anemia H/H 99991111, MCV 99991111 Folic acid and vitamin B12 levels will be checked    Hypokalemia K+ 3.1, this will be replenished   Hypoalbuminemia possibly secondary to moderate protein calorie malnutrition Albumin 2.8; protein supplement to be provided   Prolonged QT interval QTc 624 ms Avoid QT prolonging drugs Magnesium level will be checked   Paroxysmal atrial fibrillation Patient is currently in normal sinus rhythm Continue metoprolol Amiodarone will be held at this time due to prolonged QTc Eliquis will be held at this time due to hematuria   History of CVA with left hemiparesis Continue aspirin, Zocor Continue fall precaution Continue PT/OT eval and treat   OSA Continue CPAP qHS   Essential hypertension Continue metoprolol, losartan   Mixed hyperlipidemia Continue Zocor  Status is: Inpatient   Disposition: The patient is from: SNF              Anticipated d/c is to: SNF              Anticipated d/c date is: 3 days              Patient currently is not medically stable to d/c. Barriers: Not Clinically Stable-   Code Status :  -  Code Status: Full Code   Family Communication:    (patient is alert, awake and coherent)  Discussed with daughter Anderson Malta at bedside  DVT Prophylaxis  :   - SCDs   SCDs Start: 11/22/22 2209   Lab Results  Component Value Date   PLT 196 11/23/2022    Inpatient Medications  Scheduled Meds:  amiodarone  200 mg Oral Daily   aspirin EC  81 mg  Oral QPM   feeding supplement  237 mL Oral BID BM   furosemide  40 mg Intravenous Q12H   losartan  12.5 mg Oral Daily   metoprolol succinate  12.5 mg Oral Daily   simvastatin  20 mg Oral QHS   Continuous Infusions: PRN Meds:.acetaminophen **OR** acetaminophen, meclizine, [DISCONTINUED] ondansetron **OR** ondansetron (ZOFRAN) IV, prochlorperazine   Anti-infectives (From admission, onward)    None       Subjective: Izzac Acero today has no fevers, no emesis,  No chest pain,    Daughter Anderson Malta at bedside Voiding well -  Objective: Vitals:   11/22/22  2150 11/22/22 2209 11/23/22 0600 11/23/22 1358  BP: (!) 145/60  (!) 133/55 (!) 109/49  Pulse: 68  63 68  Resp: '20  20 16  '$ Temp: (!) 97.3 F (36.3 C)  97.6 F (36.4 C)   TempSrc: Oral  Axillary   SpO2: 99% 99% 95% 99%  Weight:      Height:        Intake/Output Summary (Last 24 hours) at 11/23/2022 1758 Last data filed at 11/23/2022 1500 Gross per 24 hour  Intake 240 ml  Output 2900 ml  Net -2660 ml   Filed Weights   11/22/22 1533  Weight: 93.8 kg    Physical Exam  Gen:- Awake Alert,  in no apparent distress  HEENT:- [place].AT, No sclera icterus Neck-Supple Neck,+ve JVD,.  Lungs-  -breath sounds with scattered Rales in bases CV- S1, S2 normal, regular  Abd-  +ve B.Sounds, Abd Soft, No tenderness,    Extremity/Skin:- +3 pitting of Lower and Upper extremity edema (weeping clear fluids), pedal pulses present  Psych-affect is appropriate, oriented x3 Neuro-generalized weakness , no new focal deficits, no tremors  Data Reviewed: I have personally reviewed following labs and imaging studies  CBC: Recent Labs  Lab 11/22/22 1800 11/23/22 0502  WBC 9.4 8.0  NEUTROABS 7.2  --   HGB 9.8* 10.3*  HCT 32.6* 34.0*  MCV 101.2* 101.2*  PLT 216 [id number]   Basic Metabolic Panel: Recent Labs  Lab 11/22/22 1800 11/23/22 0502  NA 141 140  K 3.1* 2.9*  CL 106 105  CO2 26 28  GLUCOSE 114* 108*  BUN 20 19  CREATININE 0.66 0.69  CALCIUM 8.5* 8.3*  MG  --  1.8  PHOS  --  3.2   GFR: Estimated Creatinine Clearance: 80.5 mL/min (by C-G formula based on SCr of 0.69 mg/dL). Liver Function Tests: Recent Labs  Lab 11/22/22 1800 11/23/22 0502  AST 16 18  ALT 14 13  ALKPHOS 69 69  BILITOT 0.4 0.8  PROT 5.8* 6.0*  ALBUMIN 2.8* 2.7*   Radiology Studies: ECHOCARDIOGRAM LIMITED  Result Date: 11/23/2022    ECHOCARDIOGRAM LIMITED REPORT   Patient Name:   Craig Fields Date of Exam: 11/23/2022 Medical Rec #:  [id number]     Height:       70.0 in Accession #:    [id number]    Weight:        206.8 lb Date of Birth:  11-29-1940     BSA:          2.117 m Patient Age:    44 years      BP:           133/55 mmHg Patient Gender: M             HR:           63 bpm. Exam Location:  [facility] Procedure:  Limited Echo, Cardiac Doppler and Limited Color Doppler Indications:    CHF-Acute Systolic [id number]  History:        Patient has prior history of Echocardiogram examinations, most                 recent 10/24/2022. CHF, Stroke, Arrythmias:Atrial Fibrillation;                 Risk Factors:Hypertension and Dyslipidemia.  Sonographer:    Alvino Chapel RCS Referring Phys: [id number] OLADAPO ADEFESO IMPRESSIONS  1. Limited Echo for LVEF assessment  2. Left ventricular ejection fraction, by estimation, is 20 to 25%. The left ventricle has severely decreased function. The left ventricle demonstrates regional wall motion abnormalities (see scoring diagram/findings for description). The left ventricular internal cavity size was moderately dilated. There is mild left ventricular hypertrophy.  3. The aortic valve is tricuspid. There is severe calcifcation of the aortic valve. Aortic valve regurgitation is moderate to severe. Aortic valve stenosis is at least moderate due to severe restriction in the leaflet mobility.  4. The inferior vena cava is dilated in size with <50% respiratory variability, suggesting right atrial pressure of 15 mmHg. Comparison(s): No significant change from prior study. Visually aortic regurgitation jet worsened compared to 10/2022 Echo. FINDINGS  Left Ventricle: Left ventricular ejection fraction, by estimation, is 20 to 25%. The left ventricle has severely decreased function. The left ventricle demonstrates regional wall motion abnormalities. Definity contrast agent was given IV to delineate the left ventricular endocardial borders. The left ventricular internal cavity size was moderately dilated. There is mild left ventricular hypertrophy.  LV Wall Scoring: The mid and distal lateral wall, anterior  septum, posterior wall, basal anterolateral segment, mid inferoseptal segment, and basal inferoseptal segment are akinetic. Mitral Valve: The mitral valve is abnormal. Moderate to severe mitral annular calcification. Mild mitral valve regurgitation. Tricuspid Valve: The tricuspid valve is normal in structure. Tricuspid valve regurgitation is trivial. Aortic Valve: The aortic valve is tricuspid. There is severe calcifcation of the aortic valve. Aortic valve regurgitation is moderate to severe. Pulmonic Valve: The pulmonic valve was not well visualized. Venous: The inferior vena cava is dilated in size with less than 50% respiratory variability, suggesting right atrial pressure of 15 mmHg. LEFT VENTRICLE PLAX 2D LVIDd:         6.20 cm LVIDs:         5.60 cm LV PW:         1.30 cm LV IVS:        1.00 cm LVOT diam:     2.00 cm LVOT Area:     3.14 cm  LV Volumes (MOD) LV vol d, MOD A2C: 148.0 ml LV vol d, MOD A4C: 172.0 ml LV vol s, MOD A2C: 91.1 ml LV vol s, MOD A4C: 119.0 ml LV SV MOD A2C:     56.9 ml LV SV MOD A4C:     172.0 ml LV SV MOD BP:      56.0 ml RIGHT VENTRICLE TAPSE (M-mode): 1.6 cm LEFT ATRIUM         Index LA diam:    4.60 cm 2.17 cm/m   AORTA Ao Root diam: 3.90 cm TRICUSPID VALVE TR Peak grad:   39.7 mmHg TR Vmax:        315.00 cm/s  SHUNTS Systemic Diam: 2.00 cm Vishnu Priya Mallipeddi Electronically signed by Lorelee Cover Mallipeddi Signature Date/Time: 11/23/2022/4:14:46 PM    Final    CT HEMATURIA WORKUP  Result Date: 11/23/2022 CLINICAL  DATA:  Hematuria EXAM: CT ABDOMEN AND PELVIS WITHOUT AND WITH CONTRAST TECHNIQUE: Multidetector CT imaging of the abdomen and pelvis was performed following the standard protocol before and following the bolus administration of intravenous contrast. RADIATION DOSE REDUCTION: This exam was performed according to the departmental dose-optimization program which includes automated exposure control, adjustment of the mA and/or kV according to patient size and/or use  of iterative reconstruction technique. CONTRAST:  130m OMNIPAQUE IOHEXOL 300 MG/ML  SOLN COMPARISON:  CT and pelvis dated October 22, 2022; CT abdomen and stone pelvis 2019 FINDINGS: Lower chest: Moderate right small left pleural effusions with atelectasis. Left-sided pleural plaques. Coronary artery calcifications. See separately dictated same CT of the chest findings for more complete discussion findings above the diaphragm. Hepatobiliary: No focal liver abnormality is seen. No gallstones, gallbladder wall thickening, or biliary dilatation. Pancreas: Unremarkable. No pancreatic ductal dilatation or surrounding inflammatory changes. Spleen: Normal in size without focal abnormality. Adrenals/Urinary Tract: Bilateral adrenal glands are unremarkable. Hydronephrosis or nephrolithiasis. Irregular subcapsular fluid collectionwith layering hyperdensity adjacent to a cystic lesion which was simple appearing on prior 2019 exam. Fluid collection is unchanged in size when compared with recent prior. Additional bilateral low-attenuation renal lesions are seen which are too small to accurately characterize. New calcification is seen bladder measuring 8 mm series 7, image 66. Evaluation of the renal collecting system is limited on delayed imaging given incomplete opacification of the renal collecting systems. Stomach/Bowel: Stomach is within normal limits. Appendix appears normal. No evidence of bowel wall thickening, distention, or inflammatory changes. Vascular/Lymphatic: Aortic atherosclerosis. No enlarged abdominal or pelvic lymph nodes. Reproductive: Unchanged prostatomegaly. Other: Moderate sized right inguinal hernia containing a portion of the bladder and trace fluid, unchanged when compared with the prior. Small fat left inguinal hernia. Postsurgical changes of the anterior abdomen with fluid collection measuring 10.0 x 2.0 cm, likely a postop seroma. Musculoskeletal: No acute or significant osseous findings.  IMPRESSION: 1. New calcification is seen bladder measuring 8 mm, likely a bladder calculus. 2. Unchanged subcapsular fluid collection of the left kidney, likely a hematoma secondary to a ruptured cyst. Recommend follow-up renal protocol CT or MRI in 3 months for further evaluation. 3. Postsurgical changes of the anterior abdomen with new fluid collection, likely a postop seroma. 4. Aortic Atherosclerosis (ICD10-I70.0). Electronically Signed   By: LYetta GlassmanM.D.   On: 11/23/2022 08:46   CT CHEST WO CONTRAST  Result Date: 11/23/2022 CLINICAL DATA:  Lung nodule, > 845mEXAM: CT CHEST WITHOUT CONTRAST TECHNIQUE: Multidetector CT imaging of the chest was performed following the standard protocol without IV contrast. RADIATION DOSE REDUCTION: This exam was performed according to the departmental dose-optimization program which includes automated exposure control, adjustment of the mA and/or kV according to patient size and/or use of iterative reconstruction technique. COMPARISON:  Chest radiograph 11/22/2022 FINDINGS: Cardiovascular: Cardiomegaly with low-density of the cardiac blood pool.Prior median sternotomy and CABG. Extensive atherosclerosis of the native coronary arteries. Ascending aorta is mildly dilated measuring up to 4.1 cm (series 2, image 68).Mildly dilated main and branch pulmonary arteries.Mild atherosclerosis of the thoracic aorta. Mediastinum/Nodes: No lymphadenopathy.The thyroid is unremarkable. Lungs/Pleura: Respiratory secretions in the distal trachea and proximal mainstem bronchi. Moderate right pleural effusion, new since February 2024. Adjacent right basilar atelectasis and additional patchy airspace disease in the right upper lobe, which corresponds to the nodular opacity seen on recent chest radiograph. Small left basilar pleural effusion with adjacent thickened and calcified pleura consistent with a chronic process. There is left basilar  airspace disease and loculated fluid collecting  in the left oblique fissure. Additional patchy opacities in the left upper lobe. No evidence of pneumothorax. Upper Abdomen: Trace upper abdominal ascites. Musculoskeletal: No acute osseous abnormality. No suspicious osseous lesion. Multilevel degenerative changes of the spine. IMPRESSION: Moderate right pleural effusion with adjacent atelectasis and patchy airspace disease in the right upper lobe, which corresponds to the nodular opacity seen on recent chest radiograph. Additional patchy opacities in the left upper lobe and loculated effusion along the left oblique fissure. Findings are favored to represent multifocal pneumonia. Recommend follow-up chest CT in 3 months to ensure resolution. Small left basilar pleural effusion with adjacent thickened and calcified pleura consistent with a chronic process. Cardiomegaly with low-density of the cardiac blood pool, can be seen in the setting of anemia. Mildly dilated main and branch pulmonary arteries, suggestive of pulmonary hypertension. Trace upper abdominal ascites. Mildly dilated ascending aorta measuring up to 4.1 cm. Recommend annual imaging followup by CTA or MRA. This recommendation follows 2010 ACCF/AHA/AATS/ACR/ASA/SCA/SCAI/SIR/STS/SVM Guidelines for the Diagnosis and Management of Patients with Thoracic Aortic Disease. Circulation. 2010; [id number]. Aortic aneurysm NOS (ICD10-I71.9). Electronically Signed   By: Maurine Simmering M.D.   On: 11/23/2022 08:45   DG Chest Portable 1 View  Result Date: 11/22/2022 CLINICAL DATA:  Lower extremity edema EXAM: PORTABLE CHEST 1 VIEW COMPARISON:  Chest x-ray dated Jan 22, 2023 FINDINGS: Cardiac and mediastinal contours are unchanged. New bilateral interstitial opacities. New nodular opacity of the right upper lobe. Improved aeration of the lung bases when compared with prior radiograph. Probable new small bilateral pleural effusions. No evidence of pneumothorax. IMPRESSION: 1. New right upper lobe pulmonary nodule.  Recommend chest CT for further evaluation. 2. New bilateral interstitial opacities and probable small bilateral pleural effusions, likely due to pulmonary edema. Electronically Signed   By: Yetta Glassman M.D.   On: 11/22/2022 19:10   DG Forearm Left  Result Date: 11/22/2022 CLINICAL DATA:  Trauma arm swelling discoloration EXAM: LEFT FOREARM - 2 VIEW COMPARISON:  None Available. FINDINGS: Osseous demineralization. No definitive fracture or malalignment. Marked soft tissue swelling. Vascular calcifications. No radiopaque foreign body in the soft tissues. IMPRESSION: Osseous demineralization without definitive fracture. Marked soft tissue swelling. Electronically Signed   By: Donavan Foil M.D.   On: 11/22/2022 18:11    Scheduled Meds:  amiodarone  200 mg Oral Daily   aspirin EC  81 mg Oral QPM   feeding supplement  237 mL Oral BID BM   furosemide  40 mg Intravenous Q12H   losartan  12.5 mg Oral Daily   metoprolol succinate  12.5 mg Oral Daily   simvastatin  20 mg Oral QHS   Continuous Infusions:   LOS: 1 day   Roxan Hockey M.D on 11/23/2022 at 5:58 PM  Go to [email or web address] - for contact info  [facility] - Office  [phone]  If 7PM-7AM, please contact night-coverage [email or web address] 11/23/2022, 5:58 PM

## 2022-11-24 ENCOUNTER — Encounter: Payer: Medicare Other | Admitting: Surgery

## 2022-11-24 DIAGNOSIS — I5043 Acute on chronic combined systolic (congestive) and diastolic (congestive) heart failure: Secondary | ICD-10-CM | POA: Diagnosis not present

## 2022-11-24 LAB — CBC WITH DIFFERENTIAL/PLATELET
Abs Immature Granulocytes: 0.04 10*3/uL (ref 0.00–0.07)
Basophils Absolute: 0 10*3/uL (ref 0.0–0.1)
Basophils Relative: 1 %
Eosinophils Absolute: 0.2 10*3/uL (ref 0.0–0.5)
Eosinophils Relative: 2 %
HCT: 34.1 % — ABNORMAL LOW (ref 39.0–52.0)
Hemoglobin: 10.2 g/dL — ABNORMAL LOW (ref 13.0–17.0)
Immature Granulocytes: 1 %
Lymphocytes Relative: 14 %
Lymphs Abs: 1.1 10*3/uL (ref 0.7–4.0)
MCH: 30.8 pg (ref 26.0–34.0)
MCHC: 29.9 g/dL — ABNORMAL LOW (ref 30.0–36.0)
MCV: 103 fL — ABNORMAL HIGH (ref 80.0–100.0)
Monocytes Absolute: 0.8 10*3/uL (ref 0.1–1.0)
Monocytes Relative: 10 %
Neutro Abs: 5.4 10*3/uL (ref 1.7–7.7)
Neutrophils Relative %: 72 %
Platelets: 209 10*3/uL (ref 150–400)
RBC: 3.31 MIL/uL — ABNORMAL LOW (ref 4.22–5.81)
RDW: 16.1 % — ABNORMAL HIGH (ref 11.5–15.5)
WBC: 7.5 10*3/uL (ref 4.0–10.5)
nRBC: 0 % (ref 0.0–0.2)

## 2022-11-24 LAB — COMPREHENSIVE METABOLIC PANEL
ALT: 14 U/L (ref 0–44)
AST: 20 U/L (ref 15–41)
Albumin: 2.8 g/dL — ABNORMAL LOW (ref 3.5–5.0)
Alkaline Phosphatase: 68 U/L (ref 38–126)
Anion gap: 12 (ref 5–15)
BUN: 21 mg/dL (ref 8–23)
CO2: 30 mmol/L (ref 22–32)
Calcium: 8.6 mg/dL — ABNORMAL LOW (ref 8.9–10.3)
Chloride: 102 mmol/L (ref 98–111)
Creatinine, Ser: 0.77 mg/dL (ref 0.61–1.24)
GFR, Estimated: >60 mL/min (ref >60)
Glucose, Bld: 105 mg/dL — ABNORMAL HIGH (ref 70–99)
Potassium: 3 mmol/L — ABNORMAL LOW (ref 3.5–5.1)
Sodium: 144 mmol/L (ref 135–145)
Total Bilirubin: 0.7 mg/dL (ref 0.3–1.2)
Total Protein: 6.1 g/dL — ABNORMAL LOW (ref 6.5–8.1)

## 2022-11-24 LAB — MAGNESIUM: Magnesium: 1.8 mg/dL (ref 1.7–2.4)

## 2022-11-24 MED ORDER — POTASSIUM CHLORIDE CRYS ER 20 MEQ PO TBCR
40.0000 meq | EXTENDED_RELEASE_TABLET | ORAL | Status: AC
  Administered 2022-11-24 (×2): 40 meq via ORAL
  Filled 2022-11-24 (×2): qty 2

## 2022-11-24 NOTE — Evaluation (Signed)
Occupational Therapy Evaluation Patient Details Name: Craig Fields MRN: [id number] DOB: 05/28/1941 Today's Date: 11/24/2022   History of Present Illness Craig Fields is an 82 y.o. male with medical history significant of hypertension, hyperlipidemia, prior CVA with left-sided hemiparesis (on chronic aspirin and Plavix), CAD s/p CABG, A-fib with RVR who presents to the emergency department via EMS from [facility] center due to 2-day onset of blood in the urine and increased swelling of the extremities.  Patient denies any painful urination or any other irritative bladder symptoms.  Apparently, patient was supposed to be of triamterene/HCTZ, but he has not been on this medication since admitted to [facility] center about 3 weeks ago  Patient was admitted from 2/9 to 2/19 due to incarcerated hernia and resultant small bowel obstruction (80 cm of necrotic bowel) s/p exploratory laparotomy and ventral hernia repair and bowel resection postoperative course was complicated requiring norepinephrine in spite of volume resuscitation and patient was transferred to [facility] for critical care services where she was intubated due to acute hypoxic respiratory failure secondary to a preoperative aspiration event, she was extubated on 2/11.  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  G2 DD.  Mild elevated PASP, RVSP 39.0 mmHg).  He denies chest pain, palpitation, fever, chills, nausea, vomiting.  Patient ambulates with a quad cane, though he has not been ambulating since being admitted to SNF   Clinical Impression   Pt agreeable to OT and PT co-evaluation. Pt came from a SNF where he was getting rehab with plans to return home. Pt required max A for bed mobility today. Very limited R UE functional use at the shoulder. Pt is able to bend the elbow and grip. L UE is contracted at baseline. Pt was left in bed with family present. Pt will benefit from continued OT in the hospital and recommended venue below to increase  strength, balance, and endurance for safe ADL's.         Recommendations for follow up therapy are one component of a multi-disciplinary discharge planning process, led by the attending physician.  Recommendations may be updated based on patient status, additional functional criteria and insurance authorization.   Follow Up Recommendations  Skilled nursing-short term rehab (<3 hours/day)     Assistance Recommended at Discharge Frequent or constant Supervision/Assistance  Patient can return home with the following A lot of help with walking and/or transfers;Two people to help with walking and/or transfers;Two people to help with bathing/dressing/bathroom;A lot of help with bathing/dressing/bathroom;Direct supervision/assist for medications management;Assist for transportation;Help with stairs or ramp for entrance    Functional Status Assessment  Patient has had a recent decline in their functional status and demonstrates the ability to make significant improvements in function in a reasonable and predictable amount of time.  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 Much assist needed.    Transfers Overall transfer level: Needs assistance   Transfers: Sit to/from Stand  General transfer comment: Attempted sit to stand with little movement despite max A.      Balance Overall balance assessment: Needs assistance   Sitting balance-Leahy Scale:  Poor Sitting balance - Comments: Seated at EOB Postural control: Posterior lean, Right lateral lean                                 ADL either performed or assessed with clinical judgement   ADL Overall ADL's : Needs assistance/impaired     Grooming: Maximal assistance;Bed level;Wash/dry hands;Wash/dry face   Upper Body Bathing: Maximal assistance;Bed level   Lower Body Bathing: Total assistance   Upper Body Dressing : Maximal assistance;Sitting   Lower Body Dressing: Total assistance   Toilet Transfer: Total assistance Toilet Transfer Details (indicate cue type and reason): Attemtped sit to stand with PT with little movement despite max A. Toileting- Clothing Manipulation and Hygiene: Total assistance       Functional mobility during ADLs: Total assistance;+2 for physical assistance;+2 for safety/equipment       Vision Baseline Vision/History: 1 Wears glasses Ability to See in Adequate Light: 0 Adequate Patient Visual Report: No change from baseline Vision Assessment?: No apparent visual deficits                Pertinent Vitals/Pain Pain Assessment Pain Assessment: Faces Faces Pain Scale: Hurts a little bit Pain Location: Back with Lumbar stretches. Pain Descriptors / Indicators: Grimacing, Guarding Pain Intervention(s): Limited activity within patient's tolerance, Monitored during session, Repositioned     Hand Dominance Right   Extremity/Trunk Assessment Upper Extremity Assessment Upper Extremity Assessment: LUE deficits/detail;RUE deficits/detail RUE Deficits / Details: Little to no A/ROM of R shoulder; P/ROM limited to 50%. WFL grip strength. LUE Deficits / Details: Contracted. No functional movement. LUE Sensation: decreased proprioception;decreased light touch LUE Coordination: decreased fine motor;decreased gross motor   Lower Extremity Assessment Lower Extremity Assessment: Defer to PT evaluation   Cervical / Trunk  Assessment Cervical / Trunk Assessment: Kyphotic Cervical / Trunk Exceptions: R leaning   Communication Communication Communication: HOH   Cognition Arousal/Alertness: Awake/alert Behavior During Therapy: WFL for tasks assessed/performed Overall Cognitive Status: Within Functional Limits for tasks assessed                                                        Home Living Family/patient expects to be discharged to:: Skilled nursing facility                                        Prior Functioning/Environment Prior Level of Function : Needs assist       Physical Assist : ADLs (physical);Mobility (physical) Mobility (physical): Transfers;Bed mobility;Gait;Stairs ADLs (physical): Feeding;Grooming;Bathing;Dressing;Toileting;IADLs Mobility Comments: Pt had only stood with assist since being at the SNF. ADLs Comments: Assited by SNF staff most recently. Family assists at baseline.        OT Problem List: Decreased strength;Decreased range of motion;Decreased activity tolerance;Impaired balance (sitting and/or standing);Decreased safety awareness;Decreased knowledge of use of DME or AE;Decreased knowledge of precautions;Increased edema      OT Treatment/Interventions: Self-care/ADL training;Therapeutic exercise;DME and/or AE instruction;Therapeutic activities;Patient/family education;Balance training    OT Goals(Current goals can be  found in the care plan section) Acute Rehab OT Goals Patient Stated Goal: return to SNF OT Goal Formulation: With patient/family Time For Goal Achievement: 12/08/22 Potential to Achieve Goals: Good  OT Frequency: Min 2X/week    Co-evaluation PT/OT/SLP Co-Evaluation/Treatment: Yes Reason for Co-Treatment: Complexity of the patient's impairments (multi-system involvement)   OT goals addressed during session: ADL's and self-care                       End of Session    Activity Tolerance: Patient  tolerated treatment well Patient left: in bed;with call bell/phone within reach;with family/visitor present  OT Visit Diagnosis: Unsteadiness on feet (R26.81);Other abnormalities of gait and mobility (R26.89);Muscle weakness (generalized) (M62.81)                Time: [id number] OT Time Calculation (min): 14 min Charges:  OT General Charges $OT Visit: 1 Visit OT Evaluation $OT Eval Low Complexity: 1 Low  [place] Bro[place] OT, MOT   Larey Seat 11/24/2022, 10:35 AM

## 2022-11-24 NOTE — Plan of Care (Signed)
  Problem: Acute Rehab OT Goals (only OT should resolve) Goal: Pt. Will Perform Grooming Flowsheets (Taken 11/24/2022 1037) Pt Will Perform Grooming:  with mod assist  sitting Goal: Pt. Will Perform Upper Body Dressing Flowsheets (Taken 11/24/2022 1037) Pt Will Perform Upper Body Dressing:  with mod assist  sitting Goal: Pt. Will Perform Lower Body Dressing Flowsheets (Taken 11/24/2022 1037) Pt Will Perform Lower Body Dressing:  with mod assist  sitting/lateral leans Goal: Pt. Will Transfer To Toilet Flowsheets (Taken 11/24/2022 1037) Pt Will Transfer to Toilet:  with max assist  with mod assist  stand pivot transfer Goal: Pt. Will Perform Toileting-Clothing Manipulation Flowsheets (Taken 11/24/2022 1037) Pt Will Perform Toileting - Clothing Manipulation and hygiene:  with max assist  with mod assist  bed level Goal: Pt/Caregiver Will Perform Home Exercise Program Flowsheets (Taken 11/24/2022 1037) Pt/caregiver will Perform Home Exercise Program:  Increased strength  Increased ROM  Right Upper extremity  Left upper extremity  With minimal assist  Karema Tocci OT, MOT

## 2022-11-24 NOTE — Evaluation (Signed)
Physical Therapy Evaluation Patient Details Name: Craig Fields MRN: [id number] DOB: 01/06/1941 Today's Date: 11/24/2022  History of Present Illness  Craig Fields is an 82 y.o. male with medical history significant of hypertension, hyperlipidemia, prior CVA with left-sided hemiparesis (on chronic aspirin and Plavix), CAD s/p CABG, A-fib with RVR who presents to the emergency department via EMS from [facility] center due to 2-day onset of blood in the urine and increased swelling of the extremities.  Patient denies any painful urination or any other irritative bladder symptoms.  Apparently, patient was supposed to be of triamterene/HCTZ, but he has not been on this medication since admitted to [facility] center about 3 weeks ago  Patient was admitted from 2/9 to 2/19 due to incarcerated hernia and resultant small bowel obstruction (80 cm of necrotic bowel) s/p exploratory laparotomy and ventral hernia repair and bowel resection postoperative course was complicated requiring norepinephrine in spite of volume resuscitation and patient was transferred to [facility] for critical care services where she was intubated due to acute hypoxic respiratory failure secondary to a preoperative aspiration event, she was extubated on 2/11.  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  G2 DD.  Mild elevated PASP, RVSP 39.0 mmHg).  He denies chest pain, palpitation, fever, chills, nausea, vomiting.  Patient ambulates with a quad cane, though he has not been ambulating since being admitted to SNF   Clinical Impression  Patient demonstrates slow labored movement for sitting up at bedside with difficulty attempting propping up on right elbow to hand due to weakness, once seated had frequent posterior leaning to the right and unable to complete sit to stands or transfers due to weakness.  Patient put back to bed with Max assist to reposition.  Patient will benefit from continued skilled physical therapy in hospital and recommended  venue below to increase strength, balance, endurance for safe ADLs and gait.         Recommendations for follow up therapy are one component of a multi-disciplinary discharge planning process, led by the attending physician.  Recommendations may be updated based on patient status, additional functional criteria and insurance authorization.  Follow Up Recommendations Skilled nursing-short term rehab (<3 hours/day) Can patient physically be transported by private vehicle: No    Assistance Recommended at Discharge Intermittent Supervision/Assistance  Patient can return home with the following  A lot of help with bathing/dressing/bathroom;A lot of help with walking and/or transfers;Help with stairs or ramp for entrance;Assistance with cooking/housework    Equipment Recommendations None recommended by PT  Recommendations for Other Services       Functional Status Assessment Patient has had a recent decline in their functional status and demonstrates the ability to make significant improvements in function in a reasonable and predictable amount of time.     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 Required Braces or Orthoses: Other Brace Other Brace: needs built up shoes, boot on left Restrictions Weight Bearing Restrictions: No      Mobility  Bed Mobility Overal bed mobility: Needs Assistance Bed Mobility: Supine to Sit     Supine to sit: Max assist Sit to supine: Max assist   General bed mobility comments: slow labored movement with diffiuclty propping up on elbow to hands with RUE    Transfers                        Ambulation/Gait  Stairs            Wheelchair Mobility    Modified Rankin (Stroke Patients Only)       Balance Overall balance assessment: Needs assistance Sitting-balance support: Feet supported, Single extremity  supported Sitting balance-Leahy Scale: Poor Sitting balance - Comments: Seated at EOB                                     Pertinent Vitals/Pain Pain Assessment Pain Assessment: Faces Faces Pain Scale: Hurts a little bit Pain Location: Back with Lumbar stretches. Pain Descriptors / Indicators: Grimacing, Guarding Pain Intervention(s): Limited activity within patient's tolerance, Monitored during session, Repositioned    Home Living Family/patient expects to be discharged to:: Private residence Living Arrangements: Spouse/significant other Available Help at Discharge: Family;Available 24 hours/day Type of Home: House Home Access: Ramped entrance       Home Layout: One level Home Equipment: [place] - quad;Shower seat;BSC/3in1;Wheelchair - manual      Prior Function Prior Level of Function : Needs assist       Physical Assist : Mobility (physical);ADLs (physical) Mobility (physical): Transfers;Bed mobility;Gait;Stairs ADLs (physical): Feeding;Grooming;Bathing;Dressing;Toileting;IADLs Mobility Comments: was ambulating with quad-cane for short household distances, recently at rehab was standing with assist ADLs Comments: Assited by SNF staff most recently. Family assists at baseline.     Hand Dominance   Dominant Hand: Right    Extremity/Trunk Assessment   Upper Extremity Assessment Upper Extremity Assessment: Defer to OT evaluation RUE Deficits / Details: Little to no A/ROM of R shoulder; P/ROM limited to 50%. WFL grip strength. LUE Deficits / Details: Contracted. No functional movement. LUE Sensation: decreased proprioception;decreased light touch LUE Coordination: decreased fine motor;decreased gross motor    Lower Extremity Assessment Lower Extremity Assessment: Generalized weakness;LLE deficits/detail RLE Deficits / Details: grossly 2/5 LLE Sensation: decreased light touch LLE Coordination: decreased fine motor;decreased gross motor    Cervical /  Trunk Assessment Cervical / Trunk Assessment: Kyphotic Cervical / Trunk Exceptions: R leaning  Communication   Communication: HOH  Cognition Arousal/Alertness: Awake/alert Behavior During Therapy: WFL for tasks assessed/performed Overall Cognitive Status: Within Functional Limits for tasks assessed                                          General Comments      Exercises     Assessment/Plan    PT Assessment Patient needs continued PT services  PT Problem List Decreased strength;Decreased mobility;Decreased activity tolerance;Decreased balance;Decreased knowledge of use of DME       PT Treatment Interventions DME instruction;Therapeutic activities;Gait training;Therapeutic exercise;Patient/family education;Cognitive remediation;Balance training;Functional mobility training    PT Goals (Current goals can be found in the Care Plan section)  Acute Rehab PT Goals Patient Stated Goal: return home after rehab PT Goal Formulation: With patient/family Time For Goal Achievement: 12/08/22 Potential to Achieve Goals: Fair    Frequency Min 3X/week     Co-evaluation PT/OT/SLP Co-Evaluation/Treatment: Yes Reason for Co-Treatment: To address functional/ADL transfers PT goals addressed during session: Mobility/safety with mobility;Balance;Proper use of DME OT goals addressed during session: ADL's and self-care       AM-PAC PT "6 Clicks" Mobility  Outcome Measure Help needed turning from your back to your side while in a flat bed without using bedrails?: A Lot Help needed moving from lying on your  back to sitting on the side of a flat bed without using bedrails?: A Lot Help needed moving to and from a bed to a chair (including a wheelchair)?: Total Help needed standing up from a chair using your arms (e.g., wheelchair or bedside chair)?: Total Help needed to walk in hospital room?: Total Help needed climbing 3-5 steps with a railing? : Total 6 Click Score: 8     End of Session   Activity Tolerance: Patient tolerated treatment well;Patient limited by fatigue Patient left: in bed;with call bell/phone within reach;with family/visitor present Nurse Communication: Mobility status PT Visit Diagnosis: Other abnormalities of gait and mobility (R26.89);Muscle weakness (generalized) (M62.81);Difficulty in walking, not elsewhere classified (R26.2);Unsteadiness on feet (R26.81)    Time: [id number] PT Time Calculation (min) (ACUTE ONLY): 23 min   Charges:   PT Evaluation $PT Eval Moderate Complexity: 1 Mod PT Treatments $Therapeutic Activity: 23-37 mins        12:18 PM, 11/24/22 Lonell Grandchild, MPT Physical Therapist with [facility] [phone] office [phone] mobile phone

## 2022-11-24 NOTE — Plan of Care (Signed)
  Problem: Acute Rehab PT Goals(only PT should resolve) Goal: Pt will Roll Supine to Side Outcome: Progressing Flowsheets (Taken 11/24/2022 1220) Pt will Roll Supine to Side: with mod assist Goal: Pt Will Go Supine/Side To Sit Outcome: Progressing Flowsheets (Taken 11/24/2022 1220) Pt will go Supine/Side to Sit: with moderate assist Goal: Pt Will Go Sit To Supine/Side Outcome: Progressing Flowsheets (Taken 11/24/2022 1220) Pt will go Sit to Supine/Side: with moderate assist Goal: Patient Will Perform Sitting Balance Outcome: Progressing Flowsheets (Taken 11/24/2022 1220) Patient will perform sitting balance:  with supervision  with min guard assist Goal: Patient Will Transfer Sit To/From Stand Outcome: Progressing Flowsheets (Taken 11/24/2022 1220) Patient will transfer sit to/from stand: with moderate assist Goal: Pt Will Transfer Bed To Chair/Chair To Bed Outcome: Progressing Flowsheets (Taken 11/24/2022 1220) Pt will Transfer Bed to Chair/Chair to Bed:  with mod assist  with max assist   12:21 PM, 11/24/22 Lonell Grandchild, MPT Physical Therapist with [facility] [phone] office [phone] mobile phone

## 2022-11-24 NOTE — Progress Notes (Signed)
[facility] Progress Note Patient: Craig Fields [id number] DOB: 09/28/1940 DOA: 11/22/2022  DOS: the patient was seen and examined on 11/24/2022  Brief hospital course: PMH of HTN, HLD, prior CVA with left-sided weakness, CAD SP CABG, paroxysmal A-fib, presented to hospital with complaints of shortness of breath. Currently being treated for acute on chronic combined CHF with IV diuresis. Assessment and Plan: Acute on chronic combined systolic and diastolic CHF. Presents with shortness of breath. Recent echocardiogram shows 2025% EF. Unchanged from prior. Chest x-ray shows vascular edema. Still on some oxygen. Continuing IV Lasix for now. All EXTR is improving.  History of HTN. Patient is on losartan metoprolol Will continue for now.  Paroxysmal A-fib. On amiodarone which I will continue. On Eliquis which is currently on hold for concerns with hematuria-hematuria currently have resolved on 3/13.  Hematuria. CT hematuria protocol performed on 3/12 shows new calcification in the bladder. Prior provider have discussed with urology. Currently hematuria appears to have resolved spontaneously. Eliquis is on hold. Will monitor recommendation from urologist.  Chronic microcytic anemia. Folic acid and [id number] level ordered which appears to be normal.  Monitor.  Outpatient follow-up.  Hypokalemia. Replaced.  History of CVA with hemiparesis. HLD. On aspirin and Zocor. Continue PT OT evaluation.  OSA. Obesity Body mass index is 31.13 kg/m.  On CPAP nightly. Placing the patient at high risk of poor outcome.  Subjective: No nausea no vomiting no fever no chills.  Breathing improving.  Physical Exam: General: in Mild distress, No Rash Cardiovascular: S1 and S2 Present, No Murmur Respiratory: Good respiratory effort, Bilateral Air entry present. No Crackles, No wheezes Abdomen: Bowel Sound present, No tenderness Extremities: Bilateral left more than right upper and lower  extremity edema Neuro: Alert and oriented x3, no new focal deficit, chronic left-sided weakness  Data Reviewed: I have Reviewed nursing notes, Vitals, and Lab results. Since last encounter, pertinent lab results CBC and BMP   . I have ordered test including CBC and BMP  .   Disposition: Status is: Inpatient Remains inpatient appropriate because: Need IV diuresis  SCDs Start: 11/22/22 2209   Family Communication: Wife at bedside Level of care: Telemetry due to CHF Vitals:   11/23/22 1358 11/23/22 2042 11/24/22 0621 11/24/22 1305  BP: (!) 109/49 (!) 143/50 (!) 131/50 (!) 121/43  Pulse: 68 65 62 65  Resp: '16 20 20   '$ Temp:  97.8 F (36.6 C)  97.8 F (36.6 C)  TempSrc:  Oral Oral   SpO2: 99% 99% 100% 99%  Weight:   98.4 kg   Height:         Author: Berle Mull, MD 11/24/2022 5:52 PM  Please look on [email or web address] to find out who is on call.

## 2022-11-25 DIAGNOSIS — I5043 Acute on chronic combined systolic (congestive) and diastolic (congestive) heart failure: Secondary | ICD-10-CM | POA: Diagnosis not present

## 2022-11-25 LAB — CBC WITH DIFFERENTIAL/PLATELET
Abs Immature Granulocytes: 0.03 10*3/uL (ref 0.00–0.07)
Basophils Absolute: 0 10*3/uL (ref 0.0–0.1)
Basophils Relative: 0 %
Eosinophils Absolute: 0.1 10*3/uL (ref 0.0–0.5)
Eosinophils Relative: 2 %
HCT: 31.7 % — ABNORMAL LOW (ref 39.0–52.0)
Hemoglobin: 9.7 g/dL — ABNORMAL LOW (ref 13.0–17.0)
Immature Granulocytes: 0 %
Lymphocytes Relative: 15 %
Lymphs Abs: 1.1 10*3/uL (ref 0.7–4.0)
MCH: 30.6 pg (ref 26.0–34.0)
MCHC: 30.6 g/dL (ref 30.0–36.0)
MCV: 100 fL (ref 80.0–100.0)
Monocytes Absolute: 0.7 10*3/uL (ref 0.1–1.0)
Monocytes Relative: 10 %
Neutro Abs: 5.4 10*3/uL (ref 1.7–7.7)
Neutrophils Relative %: 73 %
Platelets: 211 10*3/uL (ref 150–400)
RBC: 3.17 MIL/uL — ABNORMAL LOW (ref 4.22–5.81)
RDW: 16.3 % — ABNORMAL HIGH (ref 11.5–15.5)
WBC: 7.4 10*3/uL (ref 4.0–10.5)
nRBC: 0 % (ref 0.0–0.2)

## 2022-11-25 LAB — COMPREHENSIVE METABOLIC PANEL
ALT: 15 U/L (ref 0–44)
AST: 18 U/L (ref 15–41)
Albumin: 2.7 g/dL — ABNORMAL LOW (ref 3.5–5.0)
Alkaline Phosphatase: 67 U/L (ref 38–126)
Anion gap: 10 (ref 5–15)
BUN: 21 mg/dL (ref 8–23)
CO2: 30 mmol/L (ref 22–32)
Calcium: 8.5 mg/dL — ABNORMAL LOW (ref 8.9–10.3)
Chloride: 102 mmol/L (ref 98–111)
Creatinine, Ser: 0.81 mg/dL (ref 0.61–1.24)
GFR, Estimated: >60 mL/min (ref >60)
Glucose, Bld: 121 mg/dL — ABNORMAL HIGH (ref 70–99)
Potassium: 3.4 mmol/L — ABNORMAL LOW (ref 3.5–5.1)
Sodium: 142 mmol/L (ref 135–145)
Total Bilirubin: 0.6 mg/dL (ref 0.3–1.2)
Total Protein: 5.7 g/dL — ABNORMAL LOW (ref 6.5–8.1)

## 2022-11-25 LAB — MAGNESIUM: Magnesium: 1.7 mg/dL (ref 1.7–2.4)

## 2022-11-25 MED ORDER — POTASSIUM CHLORIDE CRYS ER 20 MEQ PO TBCR
40.0000 meq | EXTENDED_RELEASE_TABLET | Freq: Once | ORAL | Status: AC
  Administered 2022-11-25: 40 meq via ORAL
  Filled 2022-11-25: qty 2

## 2022-11-25 NOTE — Progress Notes (Signed)
[facility] PROGRESS NOTE  Craig Fields (DOB: 07/23/1941) [id number] PCP: Craig Helper, MD  Brief Narrative: Craig Fields is an 82 y.o. male with a history of HTN, HLD, prior CVA with left-sided weakness, CAD s/p CABG, paroxysmal A-fib, presented to hospital with complaints of shortness of breath. Currently being treated for acute on chronic combined CHF with IV diuresis.  Subjective: Dyspnea improved, no new weakness or numbness. Denies feeling ill, having fever, or chest pain. Spouse at bedside excited that the patient's appetite has improved.   Objective: BP 119/69 (BP Location: Right Arm)   Pulse 70   Temp 97.8 F (36.6 C) (Oral)   Resp 18   Ht '5\' 10"'$  (1.778 m)   Wt 98.2 kg   SpO2 98%   BMI 31.06 kg/m   Gen: Elderly frail male in no distress Pulm: Diminished without crackles or wheezes  CV: RRR, diffuse and dependent edema LLE > RLE and LUE > RUE GI: Soft, NT, ND, +BS  Neuro: Alert and oriented. No new focal deficits. Ext: Warm, dry, edematous as above. Skin: No new rashes, lesions or ulcers on visualized skin   Assessment & Plan: Acute hypoxic respiratory failure due to acute on chronic combined systolic and diastolic CHF: Recent echocardiogram shows 20-25% EF. - Wean oxygen as tolerated - Continue lasix '40mg'$  IV BID. Will discharge with standing diuretic at [facility]. - Monitor I/O, daily weights (98kg > 98kg) Remains peripherally overloaded, suspect hypoalbuminemia contributing. Lung exam improved from reported prior. Check REDS vest in AM.  - Continue GDMT   RUL pulmonary opacity w/pleural effusion, also LUL opacity w/loculated effusion tracking along left oblique fissure: Radiologically suspicious for infectious process, though pt has no productive cough, his dyspnea has improved with diuresis. No fever, no leukocytosis. - We will monitor off abx for now, though do have low threshold to start. Recommend follow-up chest CT in 3 months to  ensure resolution.  HTN:  - Continue losartan, metoprolol   PAF:  - Continue amiodarone, maintaining NSR.  - Holding eliquis with hematuria (resolved as of 3/13). Discussed r/b of restarting anticoagulation with patient and spouse today, we will consider restarting in next 24-48 hours if hematuria remains resolved.    Hematuria: CT hematuria protocol performed on 3/12 shows new calcification, suspected stone, in the bladder. - Recommend urology follow up for cystoscopy once outside acute decompensation period of CHF. - Urine culture nonclonal.    Chronic macrocytic anemia: Folic acid and 123456 level normal.  - Monitor.  Outpatient follow-up. Hgb stable.    Hypokalemia:  - Repeat supplement today. Mg ok at 1.7.    History of CVA with hemiparesis, HLD:  - Continue aspirin, statin, PT/OT. - Will plan to [facility] after hospitalization.    OSA:  - CPAP qHS  Moderate right pleural effusion with adjacent atelectasis and patchy airspace disease in the right upper lobe, which corresponds to the nodular opacity seen on recent chest radiograph. Additional patchy opacities in the left upper lobe and loculated effusion along the left oblique fissure. Findings are favored to represent multifocal pneumonia. Recommend follow-up chest CT in 3 months to ensure resolution.   Small left basilar pleural effusion with adjacent thickened and calcified pleura consistent with a chronic process.   Cardiomegaly with low-density of the cardiac blood pool, can be seen in the setting of anemia.   Mildly dilated main and branch pulmonary arteries, suggestive of pulmonary hypertension.   Trace upper abdominal ascites.   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  - Recommend annual imaging followup by CTA or MRA.  Obesity: Body mass index is 31.06 kg/m. May not qualify for this diagnosis if diuresis continues.   [name], MD [facility] [email or web address] 11/25/2022,  3:39 PM

## 2022-11-25 NOTE — Plan of Care (Signed)

## 2022-11-25 NOTE — Plan of Care (Signed)

## 2022-11-26 DIAGNOSIS — I352 Nonrheumatic aortic (valve) stenosis with insufficiency: Secondary | ICD-10-CM

## 2022-11-26 DIAGNOSIS — I25810 Atherosclerosis of coronary artery bypass graft(s) without angina pectoris: Secondary | ICD-10-CM | POA: Diagnosis not present

## 2022-11-26 DIAGNOSIS — I5043 Acute on chronic combined systolic (congestive) and diastolic (congestive) heart failure: Secondary | ICD-10-CM | POA: Diagnosis not present

## 2022-11-26 DIAGNOSIS — I480 Paroxysmal atrial fibrillation: Secondary | ICD-10-CM | POA: Diagnosis not present

## 2022-11-26 LAB — BASIC METABOLIC PANEL
Anion gap: 11 (ref 5–15)
BUN: 23 mg/dL (ref 8–23)
CO2: 30 mmol/L (ref 22–32)
Calcium: 8.5 mg/dL — ABNORMAL LOW (ref 8.9–10.3)
Chloride: 101 mmol/L (ref 98–111)
Creatinine, Ser: 0.82 mg/dL (ref 0.61–1.24)
GFR, Estimated: >60 mL/min (ref >60)
Glucose, Bld: 115 mg/dL — ABNORMAL HIGH (ref 70–99)
Potassium: 3.5 mmol/L (ref 3.5–5.1)
Sodium: 142 mmol/L (ref 135–145)

## 2022-11-26 MED ORDER — POTASSIUM CHLORIDE CRYS ER 20 MEQ PO TBCR
40.0000 meq | EXTENDED_RELEASE_TABLET | Freq: Once | ORAL | Status: AC
  Administered 2022-11-26: 40 meq via ORAL
  Filled 2022-11-26: qty 2

## 2022-11-26 NOTE — Consult Note (Addendum)
   [place] [facility] Fields Inpatient Consult   11/26/2022  Jahliel Garside Shugart 27-Sep-1940 [id number]  Orientation with Natividad Brood, [place] [facility] Liaison for review.   Location: [place] [facility] Liaison spoke with Anderson Malta (Daughter) remotely [facility]).   [facility]) [place] Patient: Insurance (Medicare ACO)    Primary Care Provider:  Fayrene Helper, MD with [facility]    Patient screened for less a positive 30 days readmission hospitalization with noted high risk score for unplanned readmission. Pt also noted to have 2 IP and 1 ED in 6 months. Pt assessed for [facility]) Care Management service needs for post Fields transition for care coordination. Daughter Anderson Malta) indicates "they wish to go back to [facility] for more rehab". States her mother (spouse) who takes care of the pt may need some additional help once he returns home. Encouraged daughter to speak with the involved social work at the facility for available resources if he returns to [employer] or the [facility] [employer] if pt returns home  with a [facility]  affiliated SNF. Daughter also receptive to a referral for [facility] RN to follow up once pt returns home for care coordination services.     Plan:  HIPAA verified and [facility] RN will continue to follow ongoing disposition to assess for post Fields community care coordination/management needs.  Referral request for community care coordination: pending disposition.   Center does not replace or interfere with any arrangements made by the Inpatient Transition of Care team.   For questions contact:    Raina Mina, RN, [place] Hours M-F 8:00 am to 5 pm [phone] mobile [phone] [Office toll free line][facility] Office Hours are M-F 8:30 - 5 pm 24 hour nurse advise line  [phone] Conceirge  [email or web address]

## 2022-11-26 NOTE — Consult Note (Signed)
CARDIOLOGY CONSULT NOTE    Patient ID: Craig Fields; [id number]; May 15, 1941   Admit date: 11/22/2022 Date of Consult: 11/26/2022  Primary Care Provider: Fayrene Helper, MD Primary Cardiologist:  Primary Electrophysiologist:     Patient Profile:   Craig Fields is a 82 y.o. male who is being seen today for the evaluation of volume overload at the request of Dr. Bonner Puna.  History of Present Illness:   Craig Fields is a 82 year old M known to have CAD s/p CABG in 2004 with LVEF 20 to 25%, valvular heart disease (moderate aortic valve regurgitation and at least moderate aortic valve stenosis, previous echo reported probably severe aortic valve stenosis), HTN, HLD was sent from pain center due to hematuria and bilateral lower EXTR swelling. But per daughter, he presented to the hospital with SOB.  Patient was apparently admitted in 10/2022 for incarcerated hernia and SBO (80 cm necrotic bowel) status post exploratory laparotomy, ventral hernia repair and bowel resection. Postop course was complicated by aspiration pneumonitis and mechanical ventilation. 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 Echocardiogram was also repeated in 11/2022 which showed similar findings. Patient currently has waxing and waning of his mental status. He does not remember why he came to the hospital. History limited and obtained from the documentation.  Past Medical History:  Diagnosis Date   Anxiety disorder    Chronic back pain    Colon cancer ([place]) 01/2006   Coronary atherosclerosis of native coronary artery    Multivessel status post CABG   CVA, old, hemiparesis ([place]) 01/2002   Left side    Essential hypertension    Hematuria 04/30/2019   New complaint, does nott  have pain of kidney stones, needs to submit UA for testing asap and needs urology eval   Hyperlipidemia    IGT (impaired glucose tolerance)    Obesity    OSA (obstructive sleep apnea)    Seizures ([place])    Age 21 or 3, no meds and no reoccurances   Skin lesion 12/07/2012   Skin tag 11/16/2011   Stroke [facility])    Umbilical hernia     Past Surgical History:  Procedure Laterality Date   BOWEL RESECTION N/A 10/22/2022   Procedure: SMALL BOWEL RESECTION;  Surgeon: Rusty Aus, DO;  Location: [facility] ORS;  Service: General;  Laterality: N/A;   COLONOSCOPY N/A 07/15/2015   Procedure: COLONOSCOPY;  Surgeon: Aviva Signs, MD;  Location: [facility] ENDO SUITE;  Service: Gastroenterology;  Laterality: N/A;   CORONARY ARTERY BYPASS GRAFT  09/13/2002   x4   HERNIA REPAIR     INCISIONAL HERNIA REPAIR N/A 01/01/2013   Procedure: HERNIA REPAIR INCISIONAL;  Surgeon: Jamesetta So, MD;  Location: [facility] ORS;  Service: General;  Laterality: N/A;  umbilical area   INSERTION OF MESH N/A 01/01/2013   Procedure: INSERTION OF MESH;  Surgeon: Jamesetta So, MD;  Location: [facility] ORS;  Service: General;  Laterality: N/A;   LAPAROTOMY N/A 10/22/2022   Procedure: EXPLORATORY LAPAROTOMY;  Surgeon: Rusty Aus, DO;  Location: [facility] ORS;  Service: General;  Laterality: N/A;   Left inguinal  hernia repair  09/14/2003   OMENTECTOMY N/A 10/22/2022   Procedure: OMENTECTOMY;  Surgeon: Rusty Aus, DO;  Location: [facility] ORS;  Service: General;  Laterality:  N/A;   [name]  09/13/2005   VENTRAL HERNIA REPAIR N/A 10/22/2022   Procedure: HERNIA REPAIR VENTRAL ADULT;  Surgeon: Rusty Aus, DO;  Location: [facility] ORS;  Service: General;  Laterality: N/A;       Inpatient Medications: Scheduled Meds:  amiodarone  200 mg Oral Daily   aspirin EC  81 mg Oral QPM   feeding supplement  237 mL Oral BID BM   furosemide  40 mg Intravenous Q12H   losartan  12.5 mg Oral Daily   metoprolol succinate  12.5 mg  Oral Daily   simvastatin  20 mg Oral QHS   Continuous Infusions:  PRN Meds: acetaminophen **OR** acetaminophen, meclizine, [DISCONTINUED] ondansetron **OR** ondansetron (ZOFRAN) IV, prochlorperazine  Allergies:    Allergies  Allergen Reactions   Bee Venom Swelling   Niacin Other (See Comments)    Muscle aches    Social History:   Social History   Socioeconomic History   Marital status: Married    Spouse name: Jolayne Haines   Number of children: 2   Years of education: Not on file   Highest education level: 12th grade  Occupational History   Occupation: retired   Tobacco Use   Smoking status: Former    Packs/day: 0.50    Years: 5.00    Additional pack years: 0.00    Total pack years: 2.50    Types: Cigarettes    Quit date: 12/27/1980    Years since quitting: 41.9   Smokeless tobacco: Former    Types: Chew    Quit date: 01/24/2013  Vaping Use   Vaping Use: Never used  Substance and Sexual Activity   Alcohol use: No    Alcohol/week: 0.0 standard drinks of alcohol   Drug use: No   Sexual activity: Not Currently    Birth control/protection: None  Other Topics Concern   Not on file  Social History Narrative   Not on file   Social Determinants of Health   Financial Resource Strain: Low Risk  (06/28/2022)   Overall Financial Resource Strain (CARDIA)    Difficulty of Paying Living Expenses: Not hard at all  Food Insecurity: No Food Insecurity (11/23/2022)   Hunger Vital Sign    Worried About Running Out of Food in the Last Year: Never true    [place] in the Last Year: Never true  Transportation Needs: No Transportation Needs (11/23/2022)   PRAPARE - Hydrologist (Medical): No    Lack of Transportation (Non-Medical): No  Physical Activity: Inactive (06/28/2022)   Exercise Vital Sign    Days of Exercise per Week: 0 days    Minutes of Exercise per Session: 0 min  Stress: No Stress Concern Present (06/28/2022)   [place]    Feeling of Stress : Not at all  Social Connections: Moderately Integrated (06/28/2022)   Social Connection and Isolation Panel [NHANES]    Frequency of Communication with Friends and Family: More than three times a week    Frequency of Social Gatherings with Friends and Family: Three times a week    Attends Religious Services: More than 4 times per year    Active Member of Clubs or Organizations: No    Attends Archivist Meetings: Never    Marital Status: Married  Human resources officer Violence: Not At Risk (11/23/2022)   Humiliation, Afraid, Rape, and Kick questionnaire    Fear of Current or Ex-Partner: No  Emotionally Abused: No    Physically Abused: No    Sexually Abused: No    Family History:    Family History  Problem Relation Age of Onset   Pancreatic cancer Mother    Heart disease Father    Prostate cancer Father    Stroke Brother    Heart disease Brother    Heart disease Brother    COPD Sister    Actinic keratosis Sister    Obesity Son    Anxiety disorder Son      ROS:  Please see the history of present illness.  ROS  All other ROS reviewed and negative.     Physical Exam/Data:   Vitals:   11/26/22 0408 11/26/22 0437 11/26/22 0621 11/26/22 1307  BP:  125/63  126/63  Pulse:  67  71  Resp:  18  18  Temp:  98.4 F (36.9 C)  98.1 F (36.7 C)  TempSrc:  Oral  Oral  SpO2:  96%  96%  Weight: 97.1 kg  96.1 kg   Height:        Intake/Output Summary (Last 24 hours) at 11/26/2022 1728 Last data filed at 11/26/2022 1500 Gross per 24 hour  Intake 240 ml  Output 1350 ml  Net -1110 ml   Filed Weights   11/25/22 0500 11/26/22 0408 11/26/22 0621  Weight: 98.2 kg 97.1 kg 96.1 kg   Body mass index is 30.4 kg/m.  General:  Well nourished, well developed, in no acute distress HEENT: normal Lymph: no adenopathy Neck: JVD halfway till the middle of the neck Endocrine:  No thryomegaly Vascular: No  carotid bruits; FA pulses 2+ bilaterally without bruits  Cardiac:  normal S1, S2; RRR; systolic murmur Lungs:  clear to auscultation bilaterally, no wheezing, rhonchi or rales  Abd: soft, nontender, no hepatomegaly  Ext: 2+ pitting edema in the left lower extremity, no edema in the right lower extremity Musculoskeletal:  No deformities, BUE and BLE strength normal and equal Skin: warm and dry  Neuro:  CNs 2-12 intact, no focal abnormalities noted Psych:  Normal affect   EKG:  The EKG was personally reviewed and demonstrates:   Telemetry:  Telemetry was personally reviewed and demonstrates:    Relevant CV Studies:   Laboratory Data:  Chemistry Recent Labs  Lab 11/24/22 0900 11/25/22 0441 11/26/22 0515  NA 144 142 142  K 3.0* 3.4* 3.5  CL 102 102 101  CO2 30 30 30   GLUCOSE 105* 121* 115*  BUN 21 21 23   CREATININE 0.77 0.81 0.82  CALCIUM 8.6* 8.5* 8.5*  GFRNONAA >60 >60 >60  ANIONGAP 12 10 11     Recent Labs  Lab 11/23/22 0502 11/24/22 0900 11/25/22 0441  PROT 6.0* 6.1* 5.7*  ALBUMIN 2.7* 2.8* 2.7*  AST 18 20 18   ALT 13 14 15   ALKPHOS 69 68 67  BILITOT 0.8 0.7 0.6   Hematology Recent Labs  Lab 11/23/22 0502 11/24/22 0900 11/25/22 0441  WBC 8.0 7.5 7.4  RBC 3.36* 3.31* 3.17*  HGB 10.3* 10.2* 9.7*  HCT 34.0* 34.1* 31.7*  MCV 101.2* 103.0* 100.0  MCH 30.7 30.8 30.6  MCHC 30.3 29.9* 30.6  RDW 16.3* 16.1* 16.3*  PLT 196 209 211   Cardiac EnzymesNo results for input(s): "TROPONINI" in the last 168 hours. No results for input(s): "TROPIPOC" in the last 168 hours.  BNP Recent Labs  Lab 11/22/22 1800  BNP 2,168.0*    DDimer No results for input(s): "DDIMER" in the last 168 hours.  Assessment and Plan:   Patient is a 82 year old M known to have CAD s/p CABG in 2004 with LVEF 20 to 25%, valvular heart disease (moderate aortic valve regurgitation and at least moderate aortic valve stenosis, previous echo reported probably severe aortic valve stenosis), HTN,  HLD is currently admitted to the hospital for the management of acute on chronic systolic heart failure exacerbation.  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 I do not think he is a candidate for invasive ischemia evaluation.  # Valvular heart disease (moderate to severe aortic valve regurgitation and at least moderate aortic valve stenosis but there is a possibility of low-flow-low gradient aortic valve stenosis) -He will likely need LHC and RHC to evaluate for new onset cardiomyopathy and also aortic valve disease. Due to comorbidities, functional limitation and waxing/waning of mental status, I do not think he is a candidate for any invasive therapeutic procedures including TAVR.  He will need to be evaluated outpatient if things improve. If not, palliative care consult to discuss goals of care.  # Paroxysmal atrial fibrillation -He was diagnosed with new onset atrial fibrillation in 10/2022 at [facility] when he was started on amiodarone. Currently in normal sinus rhythm. Okay to hold beta-blockers. -Eliquis 5 mg twice daily is on hold due to hematuria.  # CAD s/p CABG in 2004 -Continue aspirin 81 mg once daily -Continue simvastatin 20 mg nightly  I have spent a total of 75 minutes with patient reviewing chart , telemetry, EKGs, labs and examining patient as well as establishing an assessment and plan that was discussed with the patient.  > 50% of time was spent in direct patient care.      For questions or updates, please contact [place] Please consult [email or web address] for contact info under Cardiology/STEMI.   Signed, Vangie Bicker, MD 11/26/2022 5:28 PM

## 2022-11-26 NOTE — TOC Progression Note (Signed)
Transition of Care [facility]) - Progression Note    Patient Details  Name: Craig Fields MRN: [id number] Date of Birth: 1941/07/26  Transition of Care [facility]) CM/SW [place], [place] Phone Number: 11/26/2022, 10:01 AM  Clinical Narrative:    CSW spoke to [place] with [facility] who states they will be prepared for pt to admit to their facility over the weekend. Pt will need a COVID test completed within 24 hours of D/C. TOC to follow.   Expected Discharge Plan: [place] Barriers to Discharge: Continued Medical Work up  Expected Discharge Plan and Services                                               Social Determinants of Health (SDOH) Interventions SDOH Screenings   Food Insecurity: No Food Insecurity (11/23/2022)  Housing: Low Risk  (11/23/2022)  Transportation Needs: No Transportation Needs (11/23/2022)  Utilities: Not At Risk (11/23/2022)  Alcohol Screen: Low Risk  (06/28/2022)  Depression (PHQ2-9): Low Risk  (07/02/2022)  Financial Resource Strain: Low Risk  (06/28/2022)  Physical Activity: Inactive (06/28/2022)  Social Connections: Moderately Integrated (06/28/2022)  Stress: No Stress Concern Present (06/28/2022)  Tobacco Use: Medium Risk (11/22/2022)    Readmission Risk Interventions     No data to display

## 2022-11-26 NOTE — Progress Notes (Signed)
[facility] PROGRESS NOTE  Craig Fields (DOB: [birth year]) [id number] PCP: Fayrene Helper, MD  Brief Narrative: Craig Fields is an 82 y.o. male with a history of HTN, HLD, prior CVA with left-sided weakness, CAD s/p CABG, paroxysmal A-fib, presented to hospital with complaints of shortness of breath. Currently being treated for acute on chronic combined CHF with IV diuresis.  Subjective: Daughter at bedside, having waxing/waning confusion, but eating better. Breathing is better though he remains severely edematous. Urine without further hematuria.  Objective: BP 126/63 (BP Location: Right Arm)   Pulse 71   Temp 98.1 F (36.7 C) (Oral)   Resp 18   Ht 5\' 10"  (1.778 m)   Wt 96.1 kg   SpO2 96%   BMI 30.40 kg/m   Gen: Elderly frail male in no acute distress Pulm: Crackles improved, no wheezes, nonlabored  CV: RRR, +JVD laying supine, stable 3+ L and 1+ R LE edema.  GI: Soft, NT, ND, +BS Midline scar stable.  Neuro: Alert, interactive but incompletely oriented. No new focal deficits. Stable L weakness/contracture LUE. Ext: Warm, dry Skin: No new rashes, lesions or ulcers on visualized skin   5 beats NSVT overnight, asymptomatic.  Assessment & Plan: Acute hypoxic respiratory failure due to acute on chronic combined systolic and diastolic CHF: Recent echocardiogram shows 20-25% EF. - Wean oxygen as tolerated - Continue lasix 40mg  IV BID. Will discharge with standing diuretic at [facility]. Would appreciate cardiology recommendations in that regard. Use REDS vest if possible. - Monitor I/O, daily weights (98kg > 96.1kg) Remains peripherally overloaded, suspect hypoalbuminemia contributing. Lung exam improved from reported prior.  - Continue GDMT   RUL pulmonary opacity w/pleural effusion, also LUL opacity w/loculated effusion tracking along left oblique fissure: Radiologically suspicious for infectious process, though pt has no productive cough, his dyspnea has improved with  diuresis. No fever, no leukocytosis. - We will monitor off abx for now, though do have low threshold to start. Recommend follow-up chest CT in 3 months to ensure resolution.  HTN:  - Continue losartan, metoprolol   PAF:  - Continue amiodarone, maintaining NSR.  - Holding eliquis with hematuria (resolved as of 3/13). Discussed r/b of restarting anticoagulation with patient and daughter today, we will consider restarting in next 24 hours if hematuria remains resolved.    Hematuria: CT hematuria protocol performed on 3/12 shows new calcification, suspected stone, in the bladder. - Recommend urology follow up for cystoscopy once outside acute decompensation period of CHF. - Urine culture nonclonal.    Chronic macrocytic anemia: Folic acid and 123456 level normal.  - Monitor.  Outpatient follow-up. Hgb stable.    Hypokalemia:  - Repeat supplement again today. Mg ok at 1.7.    History of CVA with hemiparesis, HLD:  - Continue aspirin, statin, PT/OT. - Will plan to return to [facility] after hospitalization.    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  - Recommend annual imaging followup by CTA or MRA.  Obesity: Body mass index is 30.4 kg/m. May not qualify for this diagnosis if diuresis continues.   [name], MD [facility] [email or web address] 11/26/2022, 5:23 PM

## 2022-11-26 NOTE — Plan of Care (Signed)
  Problem: Education: Goal: Knowledge of General Education information will improve Description Including pain rating scale, medication(s)/side effects and non-pharmacologic comfort measures Outcome: Progressing   Problem: Health Behavior/Discharge Planning: Goal: Ability to manage health-related needs will improve Outcome: Progressing   

## 2022-11-26 NOTE — Progress Notes (Signed)
Attempted multiple times to obtain a redclips reading. I could only get a "Low Quality" reading.

## 2022-11-26 NOTE — Care Management Important Message (Signed)
Important Message  Patient Details  Name: Craig Fields MRN: [id number] Date of Birth: 30-Apr-1941   Medicare Important Message Given:  Yes     Tommy Medal 11/26/2022, 2:00 PM

## 2022-11-27 DIAGNOSIS — I5043 Acute on chronic combined systolic (congestive) and diastolic (congestive) heart failure: Secondary | ICD-10-CM | POA: Diagnosis not present

## 2022-11-27 LAB — BASIC METABOLIC PANEL
Anion gap: 11 (ref 5–15)
BUN: 24 mg/dL — ABNORMAL HIGH (ref 8–23)
CO2: 30 mmol/L (ref 22–32)
Calcium: 8.5 mg/dL — ABNORMAL LOW (ref 8.9–10.3)
Chloride: 100 mmol/L (ref 98–111)
Creatinine, Ser: 0.88 mg/dL (ref 0.61–1.24)
GFR, Estimated: >60 mL/min (ref >60)
Glucose, Bld: 121 mg/dL — ABNORMAL HIGH (ref 70–99)
Potassium: 3.6 mmol/L (ref 3.5–5.1)
Sodium: 141 mmol/L (ref 135–145)

## 2022-11-27 MED ORDER — POTASSIUM CHLORIDE CRYS ER 20 MEQ PO TBCR
40.0000 meq | EXTENDED_RELEASE_TABLET | Freq: Once | ORAL | Status: AC
  Administered 2022-11-27: 40 meq via ORAL
  Filled 2022-11-27: qty 2

## 2022-11-27 NOTE — TOC Progression Note (Signed)
Transition of Care [facility]) - Progression Note    Patient Details  Name: Craig Fields MRN: [id number] Date of Birth: 12/01/1940  Transition of Care [facility]) CM/SW [place], LCSW Phone Number: 11/27/2022, 3:32 PM  Clinical Narrative:    MD anticipates pt to be medically stable for d/c on Monday. MD to order Covid test tomorrow. Pt should be able to d/c to [facility] on Monday. Room 136 and report [phone]. [place] notified. TOC following.   Expected Discharge Plan: [place] Barriers to Discharge: Continued Medical Work up  Expected Discharge Plan and Services                                               Social Determinants of Health (SDOH) Interventions SDOH Screenings   Food Insecurity: No Food Insecurity (11/23/2022)  Housing: Low Risk  (11/23/2022)  Transportation Needs: No Transportation Needs (11/23/2022)  Utilities: Not At Risk (11/23/2022)  Alcohol Screen: Low Risk  (06/28/2022)  Depression (PHQ2-9): Low Risk  (07/02/2022)  Financial Resource Strain: Low Risk  (06/28/2022)  Physical Activity: Inactive (06/28/2022)  Social Connections: Moderately Integrated (06/28/2022)  Stress: No Stress Concern Present (06/28/2022)  Tobacco Use: Medium Risk (11/22/2022)    Readmission Risk Interventions     No data to display

## 2022-11-27 NOTE — Plan of Care (Signed)
  Problem: Education: Goal: Knowledge of General Education information will improve Description: Including pain rating scale, medication(s)/side effects and non-pharmacologic comfort measures Outcome: Progressing   Problem: Clinical Measurements: Goal: Ability to maintain clinical measurements within normal limits will improve Outcome: Progressing Goal: Diagnostic test results will improve Outcome: Progressing   

## 2022-11-27 NOTE — Progress Notes (Addendum)
[facility] PROGRESS NOTE  Mansour Loggins Boardman (DOB: 1941/04/08) [id number] PCP: Fayrene Helper, MD  Brief Narrative: Zeth Sodergren Cargo is an 82 y.o. male with a history of HTN, HLD, prior CVA with left-sided weakness, CAD s/p CABG, paroxysmal A-fib, presented to hospital with complaints of shortness of breath. Currently being treated for acute on chronic combined CHF with IV diuresis.  Subjective: Spouse at bedside. Pt having ongoing delirium, no agitation reported. Redirectable. No chest pain or dyspnea currently. Swelling is subjectively improved per pt/spouse.  Objective: BP (!) 102/59 (BP Location: Right Arm)   Pulse 66   Temp 97.6 F (36.4 C) (Oral)   Resp 20   Ht 5\' 10"  (1.778 m)   Wt 91.6 kg   SpO2 99%   BMI 28.98 kg/m   Gen: Elderly frail male in no distress Pulm: Clear anteriorly, pt too weak to have posterior exam. No wheezes.  CV: RRR, stable early systolic murmur, improved but still significant peripheral edema. GI: Soft, NT, ND, +BS  Neuro: Alert and conversant, follows commands, stable l weakness. No new focal deficits. Ext: Warm, dry Skin: No new rashes, lesions or ulcers on visualized skin    Assessment & Plan: Acute hypoxic respiratory failure due to acute on chronic combined systolic and diastolic CHF: Recent echocardiogram shows 20-25% EF. - Weaned from oxygen. - Continue lasix 40mg  IV BID. Will discharge with standing diuretic at [facility]. Cardiology consulted, appreciate recommendations. Unable to use REDS vest.  - Monitor I/O, daily weights (98kg >> 91.6kg) Remains peripherally overloaded, suspect hypoalbuminemia contributing. Lung exam improved from reported prior.  - Continue GDMT   RUL pulmonary opacity w/pleural effusion, also LUL opacity w/loculated effusion tracking along left oblique fissure: Radiologically suspicious for infectious process, though pt has no productive cough, his dyspnea has improved with diuresis. No fever, no leukocytosis. - We  will monitor off abx for now, though do have low threshold to start. Recommend follow-up chest CT in 3 months to ensure resolution.  HTN:  - Continue losartan; cardiology advised against continuing metoprolol.   Aortic stenosis: Stable by echo, moderate. - Cardiology follow up recommended.   PAF:  - Continue amiodarone, maintaining NSR.  - Metoprolol stopped per cardiology. - Holding eliquis with hematuria (resolved as of 3/13). Discussed r/b of restarting anticoagulation. Will recheck CBC in AM and likely to be restarting in next 24 hours if hematuria remains resolved, hgb stable.    Hematuria: CT hematuria protocol performed on 3/12 shows new calcification, suspected stone, in the bladder. - Recommend urology follow up for cystoscopy once outside acute decompensation period of CHF. - Urine culture nonclonal.    Chronic macrocytic anemia: Folic acid and 123456 level normal.  - Monitor.  Outpatient follow-up. Hgb stable.    Hypokalemia:  - Repeat supplement again today. Mg ok at 1.7.    History of CVA with hemiparesis, HLD:  - Continue aspirin, statin, PT/OT. - Will plan to return to [facility] after hospitalization.    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  - Recommend annual imaging followup by CTA or MRA.  Obesity: Body mass index is 28.98 kg/m. Does not appear to qualify for this diagnosis after diuresis.  Acute delirium: Stable, delirium precautions reviewed. Family often at bedside is helpful.   [name], MD [facility] [email or web address] 11/27/2022, 11:59 AM

## 2022-11-27 NOTE — Progress Notes (Signed)
I went to check on patient because I heard yelling in the vicinity of his room. Patient was indeed yelling out, stated, "I'm trying to wake my boy up". I reoriented patient that he was in the hospital here at [facility]. Patient was accepting and calm.  Plan of care ongoing.

## 2022-11-28 DIAGNOSIS — I5043 Acute on chronic combined systolic (congestive) and diastolic (congestive) heart failure: Secondary | ICD-10-CM | POA: Diagnosis not present

## 2022-11-28 LAB — BASIC METABOLIC PANEL
Anion gap: 9 (ref 5–15)
BUN: 23 mg/dL (ref 8–23)
CO2: 32 mmol/L (ref 22–32)
Calcium: 8.7 mg/dL — ABNORMAL LOW (ref 8.9–10.3)
Chloride: 99 mmol/L (ref 98–111)
Creatinine, Ser: 0.84 mg/dL (ref 0.61–1.24)
GFR, Estimated: >60 mL/min (ref >60)
Glucose, Bld: 105 mg/dL — ABNORMAL HIGH (ref 70–99)
Potassium: 3.3 mmol/L — ABNORMAL LOW (ref 3.5–5.1)
Sodium: 140 mmol/L (ref 135–145)

## 2022-11-28 LAB — CBC
HCT: 34.2 % — ABNORMAL LOW (ref 39.0–52.0)
Hemoglobin: 10.4 g/dL — ABNORMAL LOW (ref 13.0–17.0)
MCH: 30.6 pg (ref 26.0–34.0)
MCHC: 30.4 g/dL (ref 30.0–36.0)
MCV: 100.6 fL — ABNORMAL HIGH (ref 80.0–100.0)
Platelets: 218 10*3/uL (ref 150–400)
RBC: 3.4 MIL/uL — ABNORMAL LOW (ref 4.22–5.81)
RDW: 15.8 % — ABNORMAL HIGH (ref 11.5–15.5)
WBC: 7.4 10*3/uL (ref 4.0–10.5)
nRBC: 0 % (ref 0.0–0.2)

## 2022-11-28 MED ORDER — APIXABAN 5 MG PO TABS
5.0000 mg | ORAL_TABLET | Freq: Two times a day (BID) | ORAL | Status: DC
  Administered 2022-11-28 – 2022-11-29 (×2): 5 mg via ORAL
  Filled 2022-11-28 (×2): qty 1

## 2022-11-28 NOTE — Progress Notes (Signed)
Pt has slept off and on most of this shift. No c/o pain or discomfort.

## 2022-11-28 NOTE — Progress Notes (Signed)
Patient has taken his telemetry monitor off 3 more times since the previous note. MD notified. Plan of care ongoing.

## 2022-11-28 NOTE — Progress Notes (Signed)
[facility] PROGRESS NOTE  Craig Fields (DOB: [birth year]) [id number] PCP: Fayrene Helper, MD  Brief Narrative: Craig Fields is an 82 y.o. male with a history of HTN, HLD, prior CVA with left-sided weakness, CAD s/p CABG, paroxysmal A-fib, presented to hospital with complaints of shortness of breath. Currently being treated for acute on chronic combined CHF with IV diuresis.  Subjective: Spouse at bedside. Pt having delirium/reversed day-night rhythm. No hematuria or other complaints from patient. Subjectively, again, family feels swelling is subsiding.  Objective: BP (!) 128/56 (BP Location: Right Arm) Comment: BP recheck  Pulse 64   Temp (!) 97.5 F (36.4 C) (Oral)   Resp 16   Ht 5\' 10"  (1.778 m)   Wt 90.9 kg   SpO2 97%   BMI 28.75 kg/m   Gen: No distress Pulm: Clear anterolaterally (limited by weakness), nonlabored laying relatively supine  CV: RRR, no MRG. RLE edema improved, LLE and LUE edema also slightly improved but remain significant (chronic). GI: Soft, NT, ND, +BS  Neuro: Alert and interactive, redirectable. L weakness stable. No new focal deficits. Ext: Warm, no deformities. Skin: No new rashes, lesions or ulcers on visualized skin   Assessment & Plan: Acute hypoxic respiratory failure due to acute on chronic combined systolic and diastolic CHF: Recent echocardiogram shows 20-25% EF. - Weaned from oxygen. - Continue lasix 40mg  IV BID. Will discharge with standing diuretic at [facility]. Cardiology consulted, appreciate recommendations. Unable to use REDS vest.  - Monitor I/O, daily weights (98kg >> 91.6 >> 90.9kg), said to be 94.3kg at last discharge 11/01/2022. Remains peripherally overloaded, suspect hypoalbuminemia contributing. Lung exam improved from reported prior.  - Continue GDMT   RUL pulmonary opacity w/pleural effusion, also LUL opacity w/loculated effusion tracking along left oblique fissure: Radiologically suspicious for infectious process, though  pt has no productive cough, his dyspnea has improved with diuresis. No fever, no leukocytosis. - We will monitor off abx for now, though do have low threshold to start. Recommend follow-up chest CT in 3 months to ensure resolution.  HTN:  - Continue losartan; cardiology advised against continuing metoprolol.   Aortic stenosis: Stable by echo, moderate. - Cardiology follow up recommended.   PAF:  - Continue amiodarone, maintaining NSR.  - Metoprolol stopped per cardiology. - Holding eliquis with hematuria (resolved as of 3/13). Discussed r/b of restarting anticoagulation. Hgb stable, will restart eliquis.   Hematuria: CT hematuria protocol performed on 3/12 shows new calcification, suspected stone, in the bladder. - Recommend urology follow up for cystoscopy once outside acute decompensation period of CHF. - Urine culture nonclonal.    Chronic macrocytic anemia: Folic acid and 123456 level normal.  - Monitor.  Outpatient follow-up. Hgb stable.    Hypokalemia:  - Repeat supplement again today. Mg ok at 1.7.    History of CVA with hemiparesis, HLD:  - Continue aspirin, statin, PT/OT. - Will plan to return to [facility] after hospitalization.    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  - Recommend annual imaging followup by CTA or MRA.  Obesity: Body mass index is 28.75 kg/m. Does not appear to qualify for this diagnosis after diuresis.  Acute delirium: Delirium precautions reviewed. Family often at bedside is helpful. Anticipate improvement once out of the hospital.   [name], MD [facility] [email or web address] 11/28/2022, 11:32 AM

## 2022-11-28 NOTE — Progress Notes (Signed)
Patient has slept very little during this night on this shift. Patient has taken off his gown, telemetry monitor, and male wick 6 times. Patient reoriented each time and states, "I didn't know I took those things off". Plan of care ongoing.

## 2022-11-29 DIAGNOSIS — R410 Disorientation, unspecified: Secondary | ICD-10-CM | POA: Diagnosis not present

## 2022-11-29 DIAGNOSIS — K436 Other and unspecified ventral hernia with obstruction, without gangrene: Secondary | ICD-10-CM | POA: Diagnosis not present

## 2022-11-29 DIAGNOSIS — Z48815 Encounter for surgical aftercare following surgery on the digestive system: Secondary | ICD-10-CM | POA: Diagnosis not present

## 2022-11-29 DIAGNOSIS — N200 Calculus of kidney: Secondary | ICD-10-CM | POA: Diagnosis not present

## 2022-11-29 DIAGNOSIS — I7121 Aneurysm of the ascending aorta, without rupture: Secondary | ICD-10-CM | POA: Diagnosis not present

## 2022-11-29 DIAGNOSIS — K4040 Unilateral inguinal hernia, with gangrene, not specified as recurrent: Secondary | ICD-10-CM | POA: Diagnosis not present

## 2022-11-29 DIAGNOSIS — Z8673 Personal history of transient ischemic attack (TIA), and cerebral infarction without residual deficits: Secondary | ICD-10-CM | POA: Diagnosis not present

## 2022-11-29 DIAGNOSIS — R310 Gross hematuria: Secondary | ICD-10-CM | POA: Diagnosis not present

## 2022-11-29 DIAGNOSIS — R488 Other symbolic dysfunctions: Secondary | ICD-10-CM | POA: Diagnosis not present

## 2022-11-29 DIAGNOSIS — R279 Unspecified lack of coordination: Secondary | ICD-10-CM | POA: Diagnosis not present

## 2022-11-29 DIAGNOSIS — K5909 Other constipation: Secondary | ICD-10-CM | POA: Diagnosis not present

## 2022-11-29 DIAGNOSIS — I69954 Hemiplegia and hemiparesis following unspecified cerebrovascular disease affecting left non-dominant side: Secondary | ICD-10-CM | POA: Diagnosis not present

## 2022-11-29 DIAGNOSIS — D62 Acute posthemorrhagic anemia: Secondary | ICD-10-CM | POA: Diagnosis not present

## 2022-11-29 DIAGNOSIS — R4189 Other symptoms and signs involving cognitive functions and awareness: Secondary | ICD-10-CM | POA: Diagnosis not present

## 2022-11-29 DIAGNOSIS — I2510 Atherosclerotic heart disease of native coronary artery without angina pectoris: Secondary | ICD-10-CM | POA: Diagnosis not present

## 2022-11-29 DIAGNOSIS — J9691 Respiratory failure, unspecified with hypoxia: Secondary | ICD-10-CM | POA: Diagnosis not present

## 2022-11-29 DIAGNOSIS — E785 Hyperlipidemia, unspecified: Secondary | ICD-10-CM | POA: Diagnosis not present

## 2022-11-29 DIAGNOSIS — R634 Abnormal weight loss: Secondary | ICD-10-CM | POA: Diagnosis not present

## 2022-11-29 DIAGNOSIS — D531 Other megaloblastic anemias, not elsewhere classified: Secondary | ICD-10-CM | POA: Diagnosis not present

## 2022-11-29 DIAGNOSIS — M6281 Muscle weakness (generalized): Secondary | ICD-10-CM | POA: Diagnosis not present

## 2022-11-29 DIAGNOSIS — I350 Nonrheumatic aortic (valve) stenosis: Secondary | ICD-10-CM | POA: Diagnosis not present

## 2022-11-29 DIAGNOSIS — I4891 Unspecified atrial fibrillation: Secondary | ICD-10-CM | POA: Diagnosis not present

## 2022-11-29 DIAGNOSIS — R911 Solitary pulmonary nodule: Secondary | ICD-10-CM | POA: Diagnosis not present

## 2022-11-29 DIAGNOSIS — I10 Essential (primary) hypertension: Secondary | ICD-10-CM | POA: Diagnosis not present

## 2022-11-29 DIAGNOSIS — J9601 Acute respiratory failure with hypoxia: Secondary | ICD-10-CM | POA: Diagnosis not present

## 2022-11-29 DIAGNOSIS — I25700 Atherosclerosis of coronary artery bypass graft(s), unspecified, with unstable angina pectoris: Secondary | ICD-10-CM | POA: Diagnosis not present

## 2022-11-29 DIAGNOSIS — I214 Non-ST elevation (NSTEMI) myocardial infarction: Secondary | ICD-10-CM | POA: Diagnosis not present

## 2022-11-29 DIAGNOSIS — I25119 Atherosclerotic heart disease of native coronary artery with unspecified angina pectoris: Secondary | ICD-10-CM | POA: Diagnosis not present

## 2022-11-29 DIAGNOSIS — E876 Hypokalemia: Secondary | ICD-10-CM | POA: Diagnosis not present

## 2022-11-29 DIAGNOSIS — I5043 Acute on chronic combined systolic (congestive) and diastolic (congestive) heart failure: Secondary | ICD-10-CM | POA: Diagnosis not present

## 2022-11-29 DIAGNOSIS — R319 Hematuria, unspecified: Secondary | ICD-10-CM | POA: Diagnosis not present

## 2022-11-29 DIAGNOSIS — I480 Paroxysmal atrial fibrillation: Secondary | ICD-10-CM | POA: Diagnosis not present

## 2022-11-29 DIAGNOSIS — G8929 Other chronic pain: Secondary | ICD-10-CM | POA: Diagnosis not present

## 2022-11-29 DIAGNOSIS — E8809 Other disorders of plasma-protein metabolism, not elsewhere classified: Secondary | ICD-10-CM | POA: Diagnosis not present

## 2022-11-29 DIAGNOSIS — I69354 Hemiplegia and hemiparesis following cerebral infarction affecting left non-dominant side: Secondary | ICD-10-CM | POA: Diagnosis not present

## 2022-11-29 DIAGNOSIS — I110 Hypertensive heart disease with heart failure: Secondary | ICD-10-CM | POA: Diagnosis not present

## 2022-11-29 DIAGNOSIS — I5020 Unspecified systolic (congestive) heart failure: Secondary | ICD-10-CM | POA: Diagnosis not present

## 2022-11-29 DIAGNOSIS — I5023 Acute on chronic systolic (congestive) heart failure: Secondary | ICD-10-CM | POA: Diagnosis not present

## 2022-11-29 DIAGNOSIS — I5042 Chronic combined systolic (congestive) and diastolic (congestive) heart failure: Secondary | ICD-10-CM | POA: Diagnosis not present

## 2022-11-29 DIAGNOSIS — R29818 Other symptoms and signs involving the nervous system: Secondary | ICD-10-CM | POA: Diagnosis not present

## 2022-11-29 DIAGNOSIS — I700 Atherosclerosis of aorta: Secondary | ICD-10-CM | POA: Diagnosis not present

## 2022-11-29 DIAGNOSIS — D649 Anemia, unspecified: Secondary | ICD-10-CM | POA: Diagnosis not present

## 2022-11-29 DIAGNOSIS — G4733 Obstructive sleep apnea (adult) (pediatric): Secondary | ICD-10-CM | POA: Diagnosis not present

## 2022-11-29 LAB — BASIC METABOLIC PANEL
Anion gap: 9 (ref 5–15)
BUN: 25 mg/dL — ABNORMAL HIGH (ref 8–23)
CO2: 33 mmol/L — ABNORMAL HIGH (ref 22–32)
Calcium: 8.7 mg/dL — ABNORMAL LOW (ref 8.9–10.3)
Chloride: 98 mmol/L (ref 98–111)
Creatinine, Ser: 0.88 mg/dL (ref 0.61–1.24)
GFR, Estimated: >60 mL/min (ref >60)
Glucose, Bld: 103 mg/dL — ABNORMAL HIGH (ref 70–99)
Potassium: 3.3 mmol/L — ABNORMAL LOW (ref 3.5–5.1)
Sodium: 140 mmol/L (ref 135–145)

## 2022-11-29 LAB — SARS CORONAVIRUS 2 (TAT 6-24 HRS): SARS Coronavirus 2: NEGATIVE

## 2022-11-29 MED ORDER — POTASSIUM CHLORIDE CRYS ER 20 MEQ PO TBCR: 20.0000 meq | EXTENDED_RELEASE_TABLET | Freq: Two times a day (BID) | ORAL | Status: DC

## 2022-11-29 MED ORDER — POTASSIUM CHLORIDE CRYS ER 20 MEQ PO TBCR: 20.0000 meq | EXTENDED_RELEASE_TABLET | Freq: Two times a day (BID) | ORAL | 0 refills | Status: DC

## 2022-11-29 MED ORDER — FUROSEMIDE 40 MG PO TABS: 40.0000 mg | ORAL_TABLET | Freq: Two times a day (BID) | ORAL | 0 refills | Status: DC

## 2022-11-29 MED ORDER — AMIODARONE HCL 200 MG PO TABS: 200.0000 mg | ORAL_TABLET | Freq: Every day | ORAL | 0 refills | Status: DC

## 2022-11-29 NOTE — Progress Notes (Signed)
Rounding Note    Patient Name: Craig Fields Date of Encounter: 11/29/2022  [place] Cardiologist: Rozann Lesches, MD    Subjective   Breathing better. Hoping for discharge today  Inpatient Medications    Scheduled Meds:  amiodarone  200 mg Oral Daily   apixaban  5 mg Oral BID   aspirin EC  81 mg Oral QPM   feeding supplement  237 mL Oral BID BM   furosemide  40 mg Intravenous Q12H   losartan  12.5 mg Oral Daily   simvastatin  20 mg Oral QHS   Continuous Infusions:  PRN Meds: acetaminophen **OR** acetaminophen, meclizine, [DISCONTINUED] ondansetron **OR** ondansetron (ZOFRAN) IV, prochlorperazine   Vital Signs    Vitals:   11/28/22 1349 11/28/22 2003 11/29/22 0447 11/29/22 0735  BP: (!) 108/54 (!) 110/55 (!) 121/53   Pulse: 66 68 61   Resp:  17 19   Temp: 97.8 F (36.6 C) 97.7 F (36.5 C) (!) 97.4 F (36.3 C)   TempSrc: Oral Oral Oral   SpO2: 98% 98% 95%   Weight:    89.7 kg  Height:        Intake/Output Summary (Last 24 hours) at 11/29/2022 1005 Last data filed at 11/29/2022 0617 Gross per 24 hour  Intake 240 ml  Output 1126 ml  Net -886 ml      11/29/2022    7:35 AM 11/28/2022    6:03 AM 11/27/2022    6:18 AM  Last 3 Weights  Weight (lbs) 197 lb 12 oz 200 lb 6.4 oz 201 lb 15.1 oz  Weight (kg) 89.7 kg 90.9 kg 91.6 kg      Telemetry    NSR - Personally Reviewed  ECG       Physical Exam    GEN: No acute distress.   Neck: No JVD Cardiac: RRR, 3/6 systolic murmur LSB  Respiratory: Clear to auscultation bilaterally. GI: Soft, nontender, non-distended  [id number] LE edema; No deformity. Neuro:  Nonfocal  Psych: Normal affect   Labs    High Sensitivity Troponin:  No results for input(s): "TROPONINIHS" in the last 720 hours.   Chemistry Recent Labs  Lab 11/23/22 0502 11/24/22 0900 11/25/22 0441 11/26/22 0515 11/27/22 0623 11/28/22 0510 11/29/22 0430  NA 140 144 142   < > 141 140 140  K 2.9* 3.0* 3.4*   < > 3.6 3.3* 3.3*   CL 105 102 102   < > 100 99 98  CO2 28 30 30    < > 30 32 33*  GLUCOSE 108* 105* 121*   < > 121* 105* 103*  BUN 19 21 21    < > 24* 23 25*  CREATININE 0.69 0.77 0.81   < > 0.88 0.84 0.88  CALCIUM 8.3* 8.6* 8.5*   < > 8.5* 8.7* 8.7*  MG 1.8 1.8 1.7  --   --   --   --   PROT 6.0* 6.1* 5.7*  --   --   --   --   ALBUMIN 2.7* 2.8* 2.7*  --   --   --   --   AST 18 20 18   --   --   --   --   ALT 13 14 15   --   --   --   --   ALKPHOS 69 68 67  --   --   --   --   BILITOT 0.8 0.7 0.6  --   --   --   --  GFRNONAA >60 >60 >60   < > >60 >60 >60  ANIONGAP 7 12 10    < > 11 9 9    < > = values in this interval not displayed.    Lipids No results for input(s): "CHOL", "TRIG", "HDL", "LABVLDL", "LDLCALC", "CHOLHDL" in the last 168 hours.  Hematology Recent Labs  Lab 11/24/22 0900 11/25/22 0441 11/28/22 0510  WBC 7.5 7.4 7.4  RBC 3.31* 3.17* 3.40*  HGB 10.2* 9.7* 10.4*  HCT 34.1* 31.7* 34.2*  MCV 103.0* 100.0 100.6*  MCH 30.8 30.6 30.6  MCHC 29.9* 30.6 30.4  RDW 16.1* 16.3* 15.8*  PLT 209 211 218   Thyroid No results for input(s): "TSH", "FREET4" in the last 168 hours.  BNP Recent Labs  Lab 11/22/22 1800  BNP 2,168.0*    DDimer No results for input(s): "DDIMER" in the last 168 hours.   Radiology    No results found.  Cardiac Studies    Limited echo 11/23/22  IMPRESSIONS     1. Limited Echo for LVEF assessment   2. Left ventricular ejection fraction, by estimation, is 20 to 25%. The  left ventricle has severely decreased function. The left ventricle  demonstrates regional wall motion abnormalities (see scoring  diagram/findings for description). The left  ventricular internal cavity size was moderately dilated. There is mild  left ventricular hypertrophy.   3. The aortic valve is tricuspid. There is severe calcifcation of the  aortic valve. Aortic valve regurgitation is moderate to severe. Aortic  valve stenosis is at least moderate due to severe restriction in the   leaflet mobility.   4. The inferior vena cava is dilated in size with <50% respiratory  variability, suggesting right atrial pressure of 15 mmHg.   Comparison(s): No significant change from prior study. Visually aortic  regurgitation jet worsened compared to 10/2022 Echo.   Patient Profile     82 y.o. male  with history of CAD s/p CABG in 2004 with LVEF 20 to 25%, valvular heart disease (moderate aortic valve regurgitation and at least moderate aortic valve stenosis, previous echo reported probably severe aortic valve stenosis), HTN, HLD is currently admitted to the hospital for the management of acute on chronic systolic heart failure exacerbation.  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 today. K 3.3 needs replaced. Crt stable 0.88. -off beta-blocker due to moderate to severe AI on 3/24 echo -Continue losartan 12.5 mg once daily -Patient has waxing and waning of mental status. I do not think he is a candidate for invasive ischemia evaluation.   # Valvular heart disease (moderate to severe aortic valve regurgitation and at least moderate aortic valve stenosis but there is a possibility of low-flow-low gradient aortic valve stenosis) -He will likely need LHC and RHC to evaluate for new onset cardiomyopathy and also aortic valve disease. Due to comorbidities, functional limitation and waxing/waning of mental status, I do not think he is a candidate for any invasive therapeutic procedures including TAVR.  He will need to be evaluated outpatient if things improve. Mentation a bit clearer. Can be re-evaluated at f/u.   # Paroxysmal atrial fibrillation -He was diagnosed with new onset atrial fibrillation in 10/2022 at [facility] when he was  started on amiodarone. Currently  in normal sinus rhythm off  beta-blockers. -Eliquis 5 mg twice daily    # CAD s/p CABG in 2004 -Discontinue aspirin 81 mg once daily since on Eliquis -Continue simvastatin 20 mg nightly  # History of CVA 2003 left side hemiparesis   [facility] will sign off.   Medication Recommendations:  stop ASA, lasix 40 mg po bid, Kdur 20 meq bid Other recommendations (labs, testing, etc):   bmet in 1 week Follow up as an outpatient:  Dr. Domenic Polite 12/15/22 9:20 am  For questions or updates, please contact [place] Please consult [email or web address] for contact info under        Signed, Ermalinda Barrios, PA-C  11/29/2022, 10:05 AM

## 2022-11-29 NOTE — Care Management Important Message (Signed)
Important Message  Patient Details  Name: Craig Fields MRN: [id number] Date of Birth: [birth year]   Medicare Important Message Given:  Yes     Tommy Medal 11/29/2022, 11:29 AM

## 2022-11-29 NOTE — Discharge Summary (Signed)
Physician Discharge Summary   Patient: Craig Fields MRN: [id number] DOB: February 18, 1941  Admit date:     11/22/2022  Discharge date: 11/29/22  Discharge Physician: Elmarie Shiley   PCP: Fayrene Helper, MD   Recommendations at discharge:   Needs follow up with cardiology for further care of Systolic HF and aortic valve stenosis. 12/15/2022 Needs Bmet to follow potassium level and kidney function.  Needs CT chest in 3 months to follow pulmonary nodule, effusion  Follow up with urology for hematuria and bladder stones.   Discharge Diagnoses: Principal Problem:   Acute exacerbation of CHF (congestive heart failure) (HCC) Active Problems:   Hyperlipidemia LDL goal <70   Essential hypertension, benign   OSA on CPAP   Hematuria   Megaloblastic anemia   Hypokalemia   Hypoalbuminemia due to protein-calorie malnutrition (HCC)   Prolonged QT interval   Paroxysmal atrial tachycardia   History of CVA (cerebrovascular accident)  Resolved Problems:   * No resolved hospital problems. *  Hospital Course: Craig Fields is an 82 y.o. male with a history of HTN, HLD, prior CVA with left-sided weakness, CAD s/p CABG, paroxysmal A-fib, presented to hospital with complaints of shortness of breath. Currently being treated for acute on chronic combined CHF treated  with IV diuresis.  Assessment and Plan:  Acute hypoxic respiratory failure due to acute on chronic combined systolic and diastolic CHF: Recent echocardiogram shows 20-25% EF. - Weaned from oxygen. - Treated with  lasix 40mg  IV BID.  Cardiology consulted, appreciate recommendations. Unable to use REDS vest.  - Monitor I/O, daily weights (98kg >> 91.6 >> 90.9kg---89Kg), said to be 94.3kg at last discharge 11/01/2022. - Continue GDMT -volume status improved. [Plan to discharge on 40 mg lasix oral BID>  -Needs close follow up with cardiology    RUL pulmonary opacity w/pleural effusion, also LUL opacity w/loculated effusion tracking  along left oblique fissure: Radiologically suspicious for infectious process, though pt has no productive cough, his dyspnea has improved with diuresis. No fever, no leukocytosis. - We will monitor off abx for now, though do have low threshold to start. Recommend follow-up chest CT in 3 months to ensure resolution.   HTN:  - Continue losartan; cardiology advised against continuing metoprolol.   Aortic stenosis: Stable by echo, moderate. - Cardiology follow up recommended.  - follow up out patient.   PAF:  - Continue amiodarone, maintaining NSR.  - Metoprolol stopped per cardiology. - Eliquis initially held due to  hematuria (resolved as of 3/13). Discussed r/b of restarting anticoagulation. Hgb stable, -back on eliquis.    Hematuria: CT hematuria protocol performed on 3/12 shows new calcification, suspected stone, in the bladder. - Recommend urology follow up for cystoscopy once outside acute decompensation period of CHF. - Urine culture nonclonal.    Chronic macrocytic anemia: Folic acid and [id number] level normal.  - Monitor.  Outpatient follow-up. Hgb stable.    Hypokalemia:  - Repeat supplement again today. Mg ok at 1.7.    History of CVA with hemiparesis, HLD:  - Continue aspirin, statin, PT/OT. - Will plan to return to [facility] after hospitalization.    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  - Recommend annual imaging followup by CTA or MRA.   Obesity: Body mass index is 28.75 kg/m. Does not appear to qualify for this diagnosis after diuresis.   Acute delirium: Delirium precautions reviewed. Family often at bedside is helpful. Anticipate improvement once  out of the hospital.           Consultants:  Cardiology  Procedures performed: None Disposition: Skilled nursing facility Diet recommendation:  Discharge Diet Orders (From admission, onward)     Start     Ordered   11/29/22 0000  Diet - low sodium heart healthy        11/29/22  1102           Cardiac diet DISCHARGE MEDICATION: Allergies as of 11/29/2022       Reactions   Bee Venom Swelling   Niacin Other (See Comments)   Muscle aches        Medication List     STOP taking these medications    aspirin EC 81 MG tablet   Co Q-10 100 MG Caps   metoprolol succinate 25 MG 24 hr tablet Commonly known as: TOPROL-XL   UNABLE TO FIND   UNABLE TO FIND   UNABLE TO FIND       TAKE these medications    acetaminophen 325 MG tablet Commonly known as: TYLENOL Take 2 tablets (650 mg total) by mouth every 4 (four) hours as needed for fever, mild pain, moderate pain or headache.   amiodarone 200 MG tablet Commonly known as: PACERONE Take 1 tablet (200 mg total) by mouth daily. Start taking on: November 30, 2022   apixaban 5 MG Tabs tablet Commonly known as: ELIQUIS Take 1 tablet (5 mg total) by mouth 2 (two) times daily.   furosemide 40 MG tablet Commonly known as: Lasix Take 1 tablet (40 mg total) by mouth 2 (two) times daily.   lidocaine 4 % Place 1 patch onto the skin daily. Apply to left hip every morning - remove at hs   losartan 25 MG tablet Commonly known as: COZAAR Take 0.5 tablets (12.5 mg total) by mouth daily.   meclizine 12.5 MG tablet Commonly known as: ANTIVERT Take 12.5 mg by mouth.  May give one hour prior to PT/OT as needed for vertigo thru 11/15/22 & then provider to reassess need. Once A Day - PRN   miconazole 2 % cream Commonly known as: MICOTIN Apply 1 Application topically 2 (two) times daily.   polyethylene glycol 17 g packet Commonly known as: MIRALAX / GLYCOLAX Take 17 g by mouth daily.   potassium chloride SA 20 MEQ tablet Commonly known as: KLOR-CON M Take 1 tablet (20 mEq total) by mouth 2 (two) times daily.   simvastatin 20 MG tablet Commonly known as: ZOCOR Take 20 mg by mouth at bedtime.   THEREMS PO Take 1 tablet by mouth daily.   Vitamin D 50 MCG (2000 UT) Caps Take 1 capsule by mouth 2 (two)  times daily.               Discharge Care Instructions  (From admission, onward)           Start     Ordered   11/29/22 0000  Discharge wound care:       Comments: See above   11/29/22 1102            Contact information for after-discharge care     [facility] .   Service: Skilled Nursing Contact information: [address] [phone]                    Discharge Exam: Danley Danker Weights   11/27/22 [phone] 11/28/22 [id number]  11/29/22 0735  Weight: 91.6 kg 90.9 kg 89.7 kg  General; NAD  Condition at discharge: stable  The results of significant diagnostics from this hospitalization (including imaging, microbiology, ancillary and laboratory) are listed below for reference.   Imaging Studies: ECHOCARDIOGRAM LIMITED  Result Date: 11/23/2022    ECHOCARDIOGRAM LIMITED REPORT   Patient Name:   Craig Fields Date of Exam: 11/23/2022 Medical Rec #:  [id number]     Height:       70.0 in Accession #:    [id number]    Weight:       206.8 lb Date of Birth:  [birth year]     BSA:          2.117 m Patient Age:    36 years      BP:           133/55 mmHg Patient Gender: M             HR:           63 bpm. Exam Location:  [facility] Procedure: Limited Echo, Cardiac Doppler and Limited Color Doppler Indications:    CHF-Acute Systolic [id number]  History:        Patient has prior history of Echocardiogram examinations, most                 recent 10/24/2022. CHF, Stroke, Arrythmias:Atrial Fibrillation;                 Risk Factors:Hypertension and Dyslipidemia.  Sonographer:    Alvino Chapel RCS Referring Phys: [id number] OLADAPO ADEFESO IMPRESSIONS  1. Limited Echo for LVEF assessment  2. Left ventricular ejection fraction, by estimation, is 20 to 25%. The left ventricle has severely decreased function. The left ventricle demonstrates regional wall motion abnormalities (see scoring diagram/findings for description).  The left ventricular internal cavity size was moderately dilated. There is mild left ventricular hypertrophy.  3. The aortic valve is tricuspid. There is severe calcifcation of the aortic valve. Aortic valve regurgitation is moderate to severe. Aortic valve stenosis is at least moderate due to severe restriction in the leaflet mobility.  4. The inferior vena cava is dilated in size with <50% respiratory variability, suggesting right atrial pressure of 15 mmHg. Comparison(s): No significant change from prior study. Visually aortic regurgitation jet worsened compared to 10/2022 Echo. FINDINGS  Left Ventricle: Left ventricular ejection fraction, by estimation, is 20 to 25%. The left ventricle has severely decreased function. The left ventricle demonstrates regional wall motion abnormalities. Definity contrast agent was given IV to delineate the left ventricular endocardial borders. The left ventricular internal cavity size was moderately dilated. There is mild left ventricular hypertrophy.  LV Wall Scoring: The mid and distal lateral wall, anterior septum, posterior wall, basal anterolateral segment, mid inferoseptal segment, and basal inferoseptal segment are akinetic. Mitral Valve: The mitral valve is abnormal. Moderate to severe mitral annular calcification. Mild mitral valve regurgitation. Tricuspid Valve: The tricuspid valve is normal in structure. Tricuspid valve regurgitation is trivial. Aortic Valve: The aortic valve is tricuspid. There is severe calcifcation of the aortic valve. Aortic valve regurgitation is moderate to severe. Pulmonic Valve: The pulmonic valve was not well visualized. Venous: The inferior vena cava is dilated in size with less than 50% respiratory variability, suggesting right atrial pressure of 15 mmHg. LEFT VENTRICLE PLAX 2D LVIDd:         6.20 cm LVIDs:         5.60 cm LV PW:  1.30 cm LV IVS:        1.00 cm LVOT diam:     2.00 cm LVOT Area:     3.14 cm  LV Volumes (MOD) LV vol d,  MOD A2C: 148.0 ml LV vol d, MOD A4C: 172.0 ml LV vol s, MOD A2C: 91.1 ml LV vol s, MOD A4C: 119.0 ml LV SV MOD A2C:     56.9 ml LV SV MOD A4C:     172.0 ml LV SV MOD BP:      56.0 ml RIGHT VENTRICLE TAPSE (M-mode): 1.6 cm LEFT ATRIUM         Index LA diam:    4.60 cm 2.17 cm/m   AORTA Ao Root diam: 3.90 cm TRICUSPID VALVE TR Peak grad:   39.7 mmHg TR Vmax:        315.00 cm/s  SHUNTS Systemic Diam: 2.00 cm Vishnu Priya Mallipeddi Electronically signed by Lorelee Cover Mallipeddi Signature Date/Time: 11/23/2022/4:14:46 PM    Final    CT HEMATURIA WORKUP  Result Date: 11/23/2022 CLINICAL DATA:  Hematuria EXAM: CT ABDOMEN AND PELVIS WITHOUT AND WITH CONTRAST TECHNIQUE: Multidetector CT imaging of the abdomen and pelvis was performed following the standard protocol before and following the bolus administration of intravenous contrast. RADIATION DOSE REDUCTION: This exam was performed according to the departmental dose-optimization program which includes automated exposure control, adjustment of the mA and/or kV according to patient size and/or use of iterative reconstruction technique. CONTRAST:  15mL OMNIPAQUE IOHEXOL 300 MG/ML  SOLN COMPARISON:  CT and pelvis dated October 22, 2022; CT abdomen and stone pelvis 2019 FINDINGS: Lower chest: Moderate right small left pleural effusions with atelectasis. Left-sided pleural plaques. Coronary artery calcifications. See separately dictated same CT of the chest findings for more complete discussion findings above the diaphragm. Hepatobiliary: No focal liver abnormality is seen. No gallstones, gallbladder wall thickening, or biliary dilatation. Pancreas: Unremarkable. No pancreatic ductal dilatation or surrounding inflammatory changes. Spleen: Normal in size without focal abnormality. Adrenals/Urinary Tract: Bilateral adrenal glands are unremarkable. Hydronephrosis or nephrolithiasis. Irregular subcapsular fluid collectionwith layering hyperdensity adjacent to a cystic lesion  which was simple appearing on prior 2019 exam. Fluid collection is unchanged in size when compared with recent prior. Additional bilateral low-attenuation renal lesions are seen which are too small to accurately characterize. New calcification is seen bladder measuring 8 mm series 7, image 66. Evaluation of the renal collecting system is limited on delayed imaging given incomplete opacification of the renal collecting systems. Stomach/Bowel: Stomach is within normal limits. Appendix appears normal. No evidence of bowel wall thickening, distention, or inflammatory changes. Vascular/Lymphatic: Aortic atherosclerosis. No enlarged abdominal or pelvic lymph nodes. Reproductive: Unchanged prostatomegaly. Other: Moderate sized right inguinal hernia containing a portion of the bladder and trace fluid, unchanged when compared with the prior. Small fat left inguinal hernia. Postsurgical changes of the anterior abdomen with fluid collection measuring 10.0 x 2.0 cm, likely a postop seroma. Musculoskeletal: No acute or significant osseous findings. IMPRESSION: 1. New calcification is seen bladder measuring 8 mm, likely a bladder calculus. 2. Unchanged subcapsular fluid collection of the left kidney, likely a hematoma secondary to a ruptured cyst. Recommend follow-up renal protocol CT or MRI in 3 months for further evaluation. 3. Postsurgical changes of the anterior abdomen with new fluid collection, likely a postop seroma. 4. Aortic Atherosclerosis (ICD10-I70.0). Electronically Signed   By: Yetta Glassman M.D.   On: 11/23/2022 08:46   CT CHEST WO CONTRAST  Result Date: 11/23/2022 CLINICAL DATA:  Lung nodule, > 38mm EXAM: CT CHEST WITHOUT CONTRAST TECHNIQUE: Multidetector CT imaging of the chest was performed following the standard protocol without IV contrast. RADIATION DOSE REDUCTION: This exam was performed according to the departmental dose-optimization program which includes automated exposure control, adjustment of the  mA and/or kV according to patient size and/or use of iterative reconstruction technique. COMPARISON:  Chest radiograph 11/22/2022 FINDINGS: Cardiovascular: Cardiomegaly with low-density of the cardiac blood pool.Prior median sternotomy and CABG. Extensive atherosclerosis of the native coronary arteries. Ascending aorta is mildly dilated measuring up to 4.1 cm (series 2, image 68).Mildly dilated main and branch pulmonary arteries.Mild atherosclerosis of the thoracic aorta. Mediastinum/Nodes: No lymphadenopathy.The thyroid is unremarkable. Lungs/Pleura: Respiratory secretions in the distal trachea and proximal mainstem bronchi. Moderate right pleural effusion, new since February 2024. Adjacent right basilar atelectasis and additional patchy airspace disease in the right upper lobe, which corresponds to the nodular opacity seen on recent chest radiograph. Small left basilar pleural effusion with adjacent thickened and calcified pleura consistent with a chronic process. There is left basilar airspace disease and loculated fluid collecting in the left oblique fissure. Additional patchy opacities in the left upper lobe. No evidence of pneumothorax. Upper Abdomen: Trace upper abdominal ascites. Musculoskeletal: No acute osseous abnormality. No suspicious osseous lesion. Multilevel degenerative changes of the spine. IMPRESSION: Moderate right pleural effusion with adjacent atelectasis and patchy airspace disease in the right upper lobe, which corresponds to the nodular opacity seen on recent chest radiograph. Additional patchy opacities in the left upper lobe and loculated effusion along the left oblique fissure. Findings are favored to represent multifocal pneumonia. Recommend follow-up chest CT in 3 months to ensure resolution. Small left basilar pleural effusion with adjacent thickened and calcified pleura consistent with a chronic process. Cardiomegaly with low-density of the cardiac blood pool, can be seen in the  setting of anemia. Mildly dilated main and branch pulmonary arteries, suggestive of pulmonary hypertension. Trace upper abdominal ascites. Mildly dilated ascending aorta measuring up to 4.1 cm. Recommend annual imaging followup by CTA or MRA. This recommendation follows 2010 ACCF/AHA/AATS/ACR/ASA/SCA/SCAI/SIR/STS/SVM Guidelines for the Diagnosis and Management of Patients with Thoracic Aortic Disease. Circulation. 2010; [id number]. Aortic aneurysm NOS (ICD10-I71.9). Electronically Signed   By: Maurine Simmering M.D.   On: 11/23/2022 08:45   DG Chest Portable 1 View  Result Date: 11/22/2022 CLINICAL DATA:  Lower extremity edema EXAM: PORTABLE CHEST 1 VIEW COMPARISON:  Chest x-ray dated Jan 22, 2023 FINDINGS: Cardiac and mediastinal contours are unchanged. New bilateral interstitial opacities. New nodular opacity of the right upper lobe. Improved aeration of the lung bases when compared with prior radiograph. Probable new small bilateral pleural effusions. No evidence of pneumothorax. IMPRESSION: 1. New right upper lobe pulmonary nodule. Recommend chest CT for further evaluation. 2. New bilateral interstitial opacities and probable small bilateral pleural effusions, likely due to pulmonary edema. Electronically Signed   By: Yetta Glassman M.D.   On: 11/22/2022 19:10   DG Forearm Left  Result Date: 11/22/2022 CLINICAL DATA:  Trauma arm swelling discoloration EXAM: LEFT FOREARM - 2 VIEW COMPARISON:  None Available. FINDINGS: Osseous demineralization. No definitive fracture or malalignment. Marked soft tissue swelling. Vascular calcifications. No radiopaque foreign body in the soft tissues. IMPRESSION: Osseous demineralization without definitive fracture. Marked soft tissue swelling. Electronically Signed   By: Donavan Foil M.D.   On: 11/22/2022 18:11    Microbiology: Results for orders placed or performed during the hospital encounter of 11/22/22  Urine Culture     Status:  Abnormal   Collection Time:  11/22/22  5:30 PM   Specimen: Urine, Clean Catch  Result Value Ref Range Status   Specimen Description   Final    URINE, CLEAN CATCH Performed at [facility], [address] [place], [place] [address]    Special Requests   Final    NONE Performed at [facility], [address] [place], [place] [address]    Culture MULTIPLE SPECIES PRESENT, SUGGEST RECOLLECTION (A)  Final   Report Status 11/23/2022 FINAL  Final  SARS CORONAVIRUS 2 (TAT 6-24 HRS) Anterior Nasal Swab     Status: None   Collection Time: 11/28/22  2:00 PM   Specimen: Anterior Nasal Swab  Result Value Ref Range Status   SARS Coronavirus 2 NEGATIVE NEGATIVE Final    Comment: (NOTE) SARS-CoV-2 target nucleic acids are NOT DETECTED.  The SARS-CoV-2 RNA is generally detectable in upper and lower respiratory specimens during the acute phase of infection. Negative results do not preclude SARS-CoV-2 infection, do not rule out co-infections with other pathogens, and should not be used as the sole basis for treatment or other patient management decisions. Negative results must be combined with clinical observations, patient history, and epidemiological information. The expected result is Negative.  Fact Sheet for Patients: SugarRoll.be  Fact Sheet for Healthcare Providers: https://www.woods-mathews.com/  This test is not yet approved or cleared by the [place] FDA and  has been authorized for detection and/or diagnosis of SARS-CoV-2 by FDA under an Emergency Use Authorization (EUA). This EUA will remain  in effect (meaning this test can be used) for the duration of the COVID-19 declaration under Se ction 564(b)(1) of the Act, 21 U.S.C. section 360bbb-3(b)(1), unless the authorization is terminated or revoked sooner.  Performed at [facility], [place] [address], [place], [place] [address]     Labs: CBC: Recent Labs  Lab 11/22/22 1800 11/23/22 0502 11/24/22 0900  11/25/22 0441 11/28/22 0510  WBC 9.4 8.0 7.5 7.4 7.4  NEUTROABS 7.2  --  5.4 5.4  --   HGB 9.8* 10.3* 10.2* 9.7* 10.4*  HCT 32.6* 34.0* 34.1* 31.7* 34.2*  MCV 101.2* 101.2* 103.0* 100.0 100.6*  PLT 216 196 209 211 [id number]   Basic Metabolic Panel: Recent Labs  Lab 11/23/22 0502 11/24/22 0900 11/25/22 0441 11/26/22 0515 11/27/22 0623 11/28/22 0510 11/29/22 0430  NA 140 144 142 142 141 140 140  K 2.9* 3.0* 3.4* 3.5 3.6 3.3* 3.3*  CL 105 102 102 101 100 99 98  CO2 28 30 30 30 30  32 33*  GLUCOSE 108* 105* 121* 115* 121* 105* 103*  BUN 19 21 21 23  24* 23 25*  CREATININE 0.69 0.77 0.81 0.82 0.88 0.84 0.88  CALCIUM 8.3* 8.6* 8.5* 8.5* 8.5* 8.7* 8.7*  MG 1.8 1.8 1.7  --   --   --   --   PHOS 3.2  --   --   --   --   --   --    Liver Function Tests: Recent Labs  Lab 11/22/22 1800 11/23/22 0502 11/24/22 0900 11/25/22 0441  AST 16 18 20 18   ALT 14 13 14 15   ALKPHOS 69 69 68 67  BILITOT 0.4 0.8 0.7 0.6  PROT 5.8* 6.0* 6.1* 5.7*  ALBUMIN 2.8* 2.7* 2.8* 2.7*   CBG: No results for input(s): "GLUCAP" in the last 168 hours.  Discharge time spent: greater than 30 minutes.  Signed: Elmarie Shiley, MD [facility] 11/29/2022

## 2022-11-29 NOTE — TOC Transition Note (Signed)
Transition of Care [facility]) - CM/SW Discharge Note   Patient Details  Name: Craig Fields MRN: [id number] Date of Birth: 1940/12/14  [facility] CM/SW Contact:  Shade Flood, LCSW Phone Number: 11/29/2022, 11:19 AM   Clinical Narrative:     Pt medically stable for dc per MD. Theophilus Bones at [facility]. HIPAA compliant VMM left for pt's wife.   DC clinical sent electronically. RN to call report.  There are no other TOC needs for dc.  Final next level of care: [place] Barriers to Discharge: Barriers Resolved   Patient Goals and CMS Choice      Discharge Placement                         Discharge Plan and Services Additional resources added to the After Visit Summary for                                       Social Determinants of Health (SDOH) Interventions SDOH Screenings   Food Insecurity: No Food Insecurity (11/23/2022)  Housing: Low Risk  (11/23/2022)  Transportation Needs: No Transportation Needs (11/23/2022)  Utilities: Not At Risk (11/23/2022)  Alcohol Screen: Low Risk  (06/28/2022)  Depression (PHQ2-9): Low Risk  (07/02/2022)  Financial Resource Strain: Low Risk  (06/28/2022)  Physical Activity: Inactive (06/28/2022)  Social Connections: Moderately Integrated (06/28/2022)  Stress: No Stress Concern Present (06/28/2022)  Tobacco Use: Medium Risk (11/22/2022)     Readmission Risk Interventions     No data to display

## 2022-11-29 NOTE — Progress Notes (Signed)
Patient was up most of the night. Patient yelled out several times for "Craig Fields". Reoriented patient that he was here at [employer] and his wife is at home. Patient agreeable.  Patient ripped off his malewick x2. Patient educated. Plan of care ongoing.

## 2022-11-29 NOTE — Progress Notes (Signed)
[name] Discharge Note  Admit Date:  11/22/2022 Discharge date: 11/29/2022   Craig Fields to be D/C'd Skilled nursing facility per MD order.  AVS completed. Patient/caregiver able to verbalize understanding.  Discharge Medication: Allergies as of 11/29/2022       Reactions   Bee Venom Swelling   Niacin Other (See Comments)   Muscle aches        Medication List     STOP taking these medications    aspirin EC 81 MG tablet   Co Q-10 100 MG Caps   metoprolol succinate 25 MG 24 hr tablet Commonly known as: TOPROL-XL   UNABLE TO FIND   UNABLE TO FIND   UNABLE TO FIND       TAKE these medications    acetaminophen 325 MG tablet Commonly known as: TYLENOL Take 2 tablets (650 mg total) by mouth every 4 (four) hours as needed for fever, mild pain, moderate pain or headache.   amiodarone 200 MG tablet Commonly known as: PACERONE Take 1 tablet (200 mg total) by mouth daily. Start taking on: November 30, 2022   apixaban 5 MG Tabs tablet Commonly known as: ELIQUIS Take 1 tablet (5 mg total) by mouth 2 (two) times daily.   furosemide 40 MG tablet Commonly known as: Lasix Take 1 tablet (40 mg total) by mouth 2 (two) times daily.   lidocaine 4 % Place 1 patch onto the skin daily. Apply to left hip every morning - remove at hs   losartan 25 MG tablet Commonly known as: COZAAR Take 0.5 tablets (12.5 mg total) by mouth daily.   meclizine 12.5 MG tablet Commonly known as: ANTIVERT Take 12.5 mg by mouth.  May give one hour prior to PT/OT as needed for vertigo thru 11/15/22 & then provider to reassess need. Once A Day - PRN   miconazole 2 % cream Commonly known as: MICOTIN Apply 1 Application topically 2 (two) times daily.   polyethylene glycol 17 g packet Commonly known as: MIRALAX / GLYCOLAX Take 17 g by mouth daily.   potassium chloride SA 20 MEQ tablet Commonly known as: KLOR-CON M Take 1 tablet (20 mEq total) by mouth 2 (two) times daily.   simvastatin 20 MG  tablet Commonly known as: ZOCOR Take 20 mg by mouth at bedtime.   THEREMS PO Take 1 tablet by mouth daily.   Vitamin D 50 MCG (2000 UT) Caps Take 1 capsule by mouth 2 (two) times daily.               Discharge Care Instructions  (From admission, onward)           Start     Ordered   11/29/22 0000  Discharge wound care:       Comments: See above   11/29/22 1102            Discharge Assessment: Vitals:   11/28/22 2003 11/29/22 0447  BP: (!) 110/55 (!) 121/53  Pulse: 68 61  Resp: 17 19  Temp: 97.7 F (36.5 C) (!) 97.4 F (36.3 C)  SpO2: 98% 95%   Skin clean, dry and intact without evidence of skin break down, no evidence of skin tears noted. IV catheter discontinued intact. Site without signs and symptoms of complications - no redness or edema noted at insertion site, patient denies c/o pain - only slight tenderness at site.  Dressing with slight pressure applied.  D/c Instructions-Education: Discharge instructions given to patient/family with verbalized understanding. D/c education completed with  patient/family including follow up instructions, medication list, d/c activities limitations if indicated, with other d/c instructions as indicated by MD - patient able to verbalize understanding, all questions fully answered. Patient instructed to return to ED, call 911, or call MD for any changes in condition.  Patient escorted via stretcher by staff to [facility].  Tsosie Billing, LPN [id number] AM

## 2022-11-30 ENCOUNTER — Non-Acute Institutional Stay (SKILLED_NURSING_FACILITY): Payer: Medicare Other | Admitting: Adult Health

## 2022-11-30 ENCOUNTER — Other Ambulatory Visit: Payer: Self-pay | Admitting: *Deleted

## 2022-11-30 ENCOUNTER — Encounter: Payer: Self-pay | Admitting: Adult Health

## 2022-11-30 DIAGNOSIS — E785 Hyperlipidemia, unspecified: Secondary | ICD-10-CM

## 2022-11-30 DIAGNOSIS — R29818 Other symptoms and signs involving the nervous system: Secondary | ICD-10-CM | POA: Diagnosis not present

## 2022-11-30 DIAGNOSIS — K5909 Other constipation: Secondary | ICD-10-CM

## 2022-11-30 DIAGNOSIS — I4891 Unspecified atrial fibrillation: Secondary | ICD-10-CM | POA: Diagnosis not present

## 2022-11-30 DIAGNOSIS — E8809 Other disorders of plasma-protein metabolism, not elsewhere classified: Secondary | ICD-10-CM

## 2022-11-30 DIAGNOSIS — G8929 Other chronic pain: Secondary | ICD-10-CM | POA: Diagnosis not present

## 2022-11-30 DIAGNOSIS — J9601 Acute respiratory failure with hypoxia: Secondary | ICD-10-CM | POA: Diagnosis not present

## 2022-11-30 DIAGNOSIS — R911 Solitary pulmonary nodule: Secondary | ICD-10-CM | POA: Diagnosis not present

## 2022-11-30 DIAGNOSIS — I110 Hypertensive heart disease with heart failure: Secondary | ICD-10-CM

## 2022-11-30 DIAGNOSIS — E46 Unspecified protein-calorie malnutrition: Secondary | ICD-10-CM

## 2022-11-30 DIAGNOSIS — R4189 Other symptoms and signs involving cognitive functions and awareness: Secondary | ICD-10-CM

## 2022-11-30 DIAGNOSIS — I5042 Chronic combined systolic (congestive) and diastolic (congestive) heart failure: Secondary | ICD-10-CM | POA: Diagnosis not present

## 2022-11-30 DIAGNOSIS — D649 Anemia, unspecified: Secondary | ICD-10-CM | POA: Diagnosis not present

## 2022-11-30 DIAGNOSIS — G4733 Obstructive sleep apnea (adult) (pediatric): Secondary | ICD-10-CM | POA: Diagnosis not present

## 2022-11-30 NOTE — Progress Notes (Signed)
Location:  [place] Room Number: 136-P Place of Service:  SNF (31) Provider: Ok Edwards, NP  CODE STATUS: FULL CODE  Allergies  Allergen Reactions   Bee Venom Swelling   Niacin Other (See Comments)    Muscle aches    Chief Complaint  Patient presents with   Hospitalization [facility] follow up    HPI:  He is a 82 year old man who has been hospitalized from 11-22-22 through 11-29-22. His medical history includes: hypertension; hyperlipidemia; history of cva with left side hemiplegia. He presented to the hospital with shortness of breath. He was found to be in heart failure.  Acute hypoxic respiratory failure due to acute on chronic combined systolic and diastolic CHF: he has been weaned from his 36. Was treated with IV lasix and now on po.  Right upper lobe pulmonary opacity with pleural effusion; left upper lobe opacity with loculated effusion tracking along left oblique fissure. Radiologically suspicious for infectious process; will need ct in 3 months for follow up .  He is here for short term rehab with his goal to return back home. He denies any shortness of breath; no chest pain. 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   Left side    Essential hypertension    Hematuria 04/30/2019   New complaint, does nott have pain of kidney stones, needs to submit UA for testing asap and needs urology eval   Hyperlipidemia    IGT (impaired glucose tolerance)    Obesity    OSA (obstructive sleep apnea)    Seizures ([place])     Age 21 or 3, no meds and no reoccurances   Skin lesion 12/07/2012   Skin tag 11/16/2011   Stroke [facility])    Umbilical hernia     Past Surgical History:  Procedure Laterality Date   BOWEL RESECTION N/A 10/22/2022   Procedure: SMALL BOWEL RESECTION;  Surgeon: Rusty Aus, DO;  Location: [facility] ORS;  Service: General;  Laterality: N/A;   COLONOSCOPY N/A 07/15/2015   Procedure: COLONOSCOPY;  Surgeon: Aviva Signs, MD;  Location: [facility] ENDO SUITE;  Service: Gastroenterology;  Laterality: N/A;   CORONARY ARTERY BYPASS GRAFT  09/13/2002   x4   HERNIA REPAIR     INCISIONAL HERNIA REPAIR N/A 01/01/2013   Procedure: HERNIA REPAIR INCISIONAL;  Surgeon: Jamesetta So, MD;  Location: [facility] ORS;  Service: General;  Laterality: N/A;  umbilical area   INSERTION OF MESH N/A 01/01/2013   Procedure: INSERTION OF MESH;  Surgeon: Jamesetta So, MD;  Location: [facility] ORS;  Service: General;  Laterality: N/A;   LAPAROTOMY N/A 10/22/2022   Procedure: EXPLORATORY LAPAROTOMY;  Surgeon: Rusty Aus, DO;  Location: [facility] ORS;  Service: General;  Laterality: N/A;   Left inguinal  hernia repair  09/14/2003   OMENTECTOMY N/A 10/22/2022   Procedure: OMENTECTOMY;  Surgeon: Rusty Aus, DO;  Location: [facility] ORS;  Service: General;  Laterality: N/A;   [name]  09/13/2005   VENTRAL HERNIA REPAIR N/A 10/22/2022  Procedure: HERNIA REPAIR VENTRAL ADULT;  Surgeon: Rusty Aus, DO;  Location: [facility] ORS;  Service: General;  Laterality: N/A;    Social History   Socioeconomic History   Marital status: Married    Spouse name: Jolayne Haines   Number of children: 2   Years of education: Not on file   Highest education level: 12th grade  Occupational History   Occupation: retired   Tobacco Use   Smoking status: Former    Packs/day: 0.50    Years: 5.00    Additional pack years: 0.00    Total pack years: 2.50    Types: Cigarettes    Quit date: 12/27/1980    Years since quitting: 41.9   Smokeless  tobacco: Former    Types: Chew    Quit date: 01/24/2013  Vaping Use   Vaping Use: Never used  Substance and Sexual Activity   Alcohol use: No    Alcohol/week: 0.0 standard drinks of alcohol   Drug use: No   Sexual activity: Not Currently    Birth control/protection: None  Other Topics Concern   Not on file  Social History Narrative   Not on file   Social Determinants of Health   Financial Resource Strain: Low Risk  (06/28/2022)   Overall Financial Resource Strain (CARDIA)    Difficulty of Paying Living Expenses: Not hard at all  Food Insecurity: No Food Insecurity (11/23/2022)   Hunger Vital Sign    Worried About Running Out of Food in the Last Year: Never true    [place] in the Last Year: Never true  Transportation Needs: No Transportation Needs (11/23/2022)   PRAPARE - Hydrologist (Medical): No    Lack of Transportation (Non-Medical): No  Physical Activity: Inactive (06/28/2022)   Exercise Vital Sign    Days of Exercise per Week: 0 days    Minutes of Exercise per Session: 0 min  Stress: No Stress Concern Present (06/28/2022)   Gas    Feeling of Stress : Not at all  Social Connections: Moderately Integrated (06/28/2022)   Social Connection and Isolation Panel [NHANES]    Frequency of Communication with Friends and Family: More than three times a week    Frequency of Social Gatherings with Friends and Family: Three times a week    Attends Religious Services: More than 4 times per year    Active Member of Clubs or Organizations: No    Attends Archivist Meetings: Never    Marital Status: Married  Human resources officer Violence: Not At Risk (11/23/2022)   Humiliation, Afraid, Rape, and Kick questionnaire    Fear of Current or Ex-Partner: No    Emotionally Abused: No    Physically Abused: No    Sexually Abused: No   Family History  Problem Relation Age of  Onset   Pancreatic cancer Mother    Heart disease Father    Prostate cancer Father    Stroke Brother    Heart disease Brother    Heart disease Brother    COPD Sister    Actinic keratosis Sister    Obesity Son    Anxiety disorder Son       VITAL SIGNS BP 122/64   Pulse 68   Temp (!) 97.5 F (36.4 C)   Resp 20   Ht 5\' 10"  (1.778 m)   Wt 187 lb 9.6 oz (85.1 kg)   SpO2 97%  BMI 26.92 kg/m   Outpatient Encounter Medications as of 11/30/2022  Medication Sig   acetaminophen (TYLENOL) 325 MG tablet Take 2 tablets (650 mg total) by mouth every 4 (four) hours as needed for fever, mild pain, moderate pain or headache.   amiodarone (PACERONE) 200 MG tablet Take 1 tablet (200 mg total) by mouth daily.   apixaban (ELIQUIS) 5 MG TABS tablet Take 1 tablet (5 mg total) by mouth 2 (two) times daily.   furosemide (LASIX) 40 MG tablet Take 1 tablet (40 mg total) by mouth 2 (two) times daily.   lidocaine 4 % Place 1 patch onto the skin daily. Apply to left hip every morning - remove at hs   losartan (COZAAR) 25 MG tablet Take 0.5 tablets (12.5 mg total) by mouth daily.   miconazole (MICOTIN) 2 % cream Apply 1 Application topically 2 (two) times daily.   polyethylene glycol (MIRALAX / GLYCOLAX) 17 g packet Take 17 g by mouth daily.   potassium chloride SA (KLOR-CON M) 20 MEQ tablet Take 1 tablet (20 mEq total) by mouth 2 (two) times daily.   simvastatin (ZOCOR) 20 MG tablet Take 20 mg by mouth at bedtime.   No facility-administered encounter medications on file as of 11/30/2022.     SIGNIFICANT DIAGNOSTIC EXAMS  TODAY  11-23-22: 2-d echo 1. Limited Echo for LVEF assessment   2. Left ventricular ejection fraction, by estimation, is 20 to 25%. The  left ventricle has severely decreased function. The left ventricle  demonstrates regional wall motion abnormalities (see scoring  diagram/findings for description). The left  ventricular internal cavity size was moderately dilated. There is mild   left ventricular hypertrophy.   3. The aortic valve is tricuspid. There is severe calcifcation of the  aortic valve. Aortic valve regurgitation is moderate to severe. Aortic  valve stenosis is at least moderate due to severe restriction in the  leaflet mobility.   4. The inferior vena cava is dilated in size with <50% respiratory  variability, suggesting right atrial pressure of 15 mmHg.   11-23-22: ct of chest:  Moderate right pleural effusion with adjacent atelectasis and patchy airspace disease in the right upper lobe, which corresponds to the nodular opacity seen on recent chest radiograph. Additional patchy opacities in the left upper lobe and loculated effusion along the left oblique fissure. Findings are favored to represent multifocal pneumonia. Recommend follow-up chest CT in 3 months to ensure resolution. Small left basilar pleural effusion with adjacent thickened and calcified pleura consistent with a chronic process. Cardiomegaly with low-density of the cardiac blood pool, can be seen in the setting of anemia.       Mildly dilated main and branch pulmonary arteries, suggestive of pulmonary hypertension. Trace upper abdominal ascites. Mildly dilated ascending aorta measuring up to 4.1 cm  11-23-22 ct of abdomen and pelvis:  1. New calcification is seen bladder measuring 8 mm, likely a bladder calculus. 2. Unchanged subcapsular fluid collection of the left kidney, likely a hematoma secondary to a ruptured cyst.  3. Postsurgical changes of the anterior abdomen with new fluid collection, likely a postop seroma. 4. Aortic Atherosclerosis  LABS REVIEWED;   11-22-22: wbc 9.4; hgb 9.8; hct 32.6; mcv 101.2 plt 216; glucose 108; bun 19; creat 0.69; k+ 2.9; na++ 140; ca 8.3 gfr >60; protein 6.0 albumin 2.8; BNP 2168.0 11-23-22; wbc 8.0; hgb 10.3; hct 34.0; mcv 101.2 plt 196; glucose 108; bun 19; creat 0.69; k+ 2.9; na++ 140; ca 8.3; gfr >60; vitamin B  12: 462; folate 24.3; phos 3.2 mag  1.8 11-29-22: glucose  103; bun 25; creat 0.88; k+ 3.3; na++ 140; ca 8.7; gfr >60  Review of Systems  Constitutional:  Negative for malaise/fatigue.  Respiratory:  Negative for cough and shortness of breath.   Cardiovascular:  Negative for chest pain, palpitations and leg swelling.  Gastrointestinal:  Negative for abdominal pain, constipation and heartburn.  Musculoskeletal:  Negative for back pain, joint pain and myalgias.  Skin: Negative.   Neurological:  Negative for dizziness.  Psychiatric/Behavioral:  The patient is not nervous/anxious.     Physical Exam Constitutional:      General: He is not in acute distress.    Appearance: He is well-developed. He is not diaphoretic.  Neck:     Thyroid: No thyromegaly.  Cardiovascular:     Rate and Rhythm: Normal rate and regular rhythm.     Heart sounds: Murmur heard.  Pulmonary:     Effort: Pulmonary effort is normal. No respiratory distress.     Breath sounds: Normal breath sounds.  Abdominal:     General: Bowel sounds are normal. There is no distension.     Palpations: Abdomen is soft.     Tenderness: There is no abdominal tenderness.  Musculoskeletal:     Cervical back: Neck supple.     Right lower leg: No edema.     Left lower leg: No edema.     Comments: Left hemiplegia a  Lymphadenopathy:     Cervical: No cervical adenopathy.  Skin:    General: Skin is warm and dry.  Neurological:     Mental Status: He is alert. Mental status is at baseline.  Psychiatric:        Mood and Affect: Mood normal.       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  Acute respiratory failure with hypoxia: is improved; is now on room air  3. Benign hypertension with coincident congestive heart failure: b/p 122/64 will continue cozaar 12.5 mg daily   4. Atrial fibrillation with rapid ventricular response: heart rate  stable is on amiodarone 200 mg daily for rate control and eliquis 5 mg twice daily   5. OSA on CPAP: will monitor   6. Right upper lobe pulmonary nodule: will repeat ct scan in 3 months  7. 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 Is without change  8. Neurocognitive deficits 11-03-22: SLUMS 12/30  9.  Hyperlipidemia LDL goal <70: will continue zocor 20 mg daily   10. Hypoalbuminemia due to protein calorie malnutrition: protein 6.0 albumin 2.7 will begin prostat awc 30 mL with meals   11. Other chronic pain: will continue lidoderm to left hip  12. Chronic constipation: will continue miralax daily   13. Chronic anemia: hgb 10.3    Ok Edwards NP [facility]   call [phone]

## 2022-11-30 NOTE — Patient Outreach (Signed)
[place] Coordinator follow up. [name] returned to [facility] on 11/29/22.   Message sent to Jamse [place] social worker to make aware Probation officer will follow for potential [facility] care coordination services as benefit of health plan and Primary Care Provider.   Marthenia Rolling, MSN, RN,BSN [place] Acute Care Coordinator [phone] (Direct dial)

## 2022-12-01 ENCOUNTER — Non-Acute Institutional Stay (SKILLED_NURSING_FACILITY): Payer: Medicare Other | Admitting: Internal Medicine

## 2022-12-01 ENCOUNTER — Encounter: Payer: Self-pay | Admitting: Internal Medicine

## 2022-12-01 DIAGNOSIS — R4189 Other symptoms and signs involving cognitive functions and awareness: Secondary | ICD-10-CM | POA: Diagnosis not present

## 2022-12-01 DIAGNOSIS — D531 Other megaloblastic anemias, not elsewhere classified: Secondary | ICD-10-CM

## 2022-12-01 DIAGNOSIS — R29818 Other symptoms and signs involving the nervous system: Secondary | ICD-10-CM

## 2022-12-01 DIAGNOSIS — I5043 Acute on chronic combined systolic (congestive) and diastolic (congestive) heart failure: Secondary | ICD-10-CM | POA: Diagnosis not present

## 2022-12-01 DIAGNOSIS — R911 Solitary pulmonary nodule: Secondary | ICD-10-CM | POA: Diagnosis not present

## 2022-12-01 NOTE — Progress Notes (Signed)
NURSING HOME LOCATION:  [facility] ROOM NUMBER:  136 P  CODE STATUS:  Full Code  PCP: Tula Nakayama MD  This is a nursing facility follow up visit for [facility] readmission within 30 days  to document compliance with Regulation 483.30 (c) in [employer] [place] Phase [employer] which mandates caregiver visit ( visits can alternate among physician, PA or NP as per statutes) within 10 days of 30 days / 60 days/ 90 days post admission to SNF date    Interim medical record and care since last SNF visit was updated with review of diagnostic studies and change in clinical status since last visit were documented.  HPI: He was rehospitalized 3/11 - 11/29/2022 with acute exacerbation of congestive heart failure.  BNP was 2168.  Echo revealed EF of 20-25%.  There was severe decrease in the left ventricular function and moderate-severe aortic regurgitation.  Aortic stenosis was moderate and serially stable.   Chest x-ray revealed pulmonary edema and new right upper lobe nodule.  Aggressive diuresis with furosemide 40 mg IV twice daily was initiated.  I&O and daily weights were monitored documenting progressive improvement in volume status.  Cardiology consulted and recommended continuing ARB but holding metoprolol.  Amiodarone was continued for PAF. Hypokalemia was present with a nadir value of 2.7; with repletion final value was 3.3 prior to discharge. Course was complicated by hematuria for which Eliquis was held.  Serially hemoglobins were stable.  On 3/11 H/H was 9.8/32.6 with MCV of 101.2.  Final H/H was 10.4/34.2 with an MCV of 100.6.  Folic acid and 123456 levels are normal. Aspirin and statin were continued as secondary prophylaxis because of history of CVA complicated by hemiparesis.  CPAP was continued at night for OSA. Ascending aortic aneurysm measured 4.1 centimeters; annual monitor was recommended by CTA or MRA. Follow-up of right upper lobe nodule was to be  pursued as outpatient with CT in 3 months. PT/OT recommended SNF placement for rehab.  Review of systems: Dementia prevented completion. When asked why he had been readmitted his response was "When?"  He could provide no history and was oriented only to self.  Physical exam:  Pertinent or positive findings: Initially asleep exhibiting hypopnea.  Hair is thin and pattern alopecia is present.  Eyebrows are also thin in density.  Ecchymosis is present over the right malar area.  He is edentulous.  The right nasolabial fold is decreased.  Upon awakening he exhibited garbled speech.  There is a faint basilar murmur.  He has Keach expiratory rhonchi.  Abdomen is protuberant.  He has 1/2+ edema at the sock line.  Pedal pulses are decreased.  When the left foot was palpated the left great toe was upgoing.  He has isolated bruising over the forearms.  There is asymmetric weakness with the right upper and lower extremities significantly stronger than the left.  There is clawing of the left hand.  General appearance: Adequately nourished; no acute distress, increased work of breathing is present.   Lymphatic: No lymphadenopathy about the head, neck, axilla. Eyes: No conjunctival inflammation or lid edema is present. There is no scleral icterus. Ears:  External ear exam shows no significant lesions or deformities.   Nose:  External nasal examination shows no deformity or inflammation. Nasal mucosa are pink and moist without lesions, exudates Oral exam:  Lips and gums are healthy appearing. Neck:  No thyromegaly, masses, tenderness noted.    Heart:  Normal rate and regular  rhythm. S1 and S2 normal without gallop, click, rub .  Lungs: without wheezes, rales, rubs. Abdomen: Bowel sounds are normal. Abdomen is soft and nontender with no organomegaly, hernias, masses. GU: Deferred  Extremities:  No cyanosis, clubbing Neurologic exam :Balance, Rhomberg, finger to nose testing could not be completed due to clinical  state Skin: Warm & dry w/o tenting.  See summary under each active problem in the Problem List with associated updated therapeutic plan

## 2022-12-02 ENCOUNTER — Other Ambulatory Visit (HOSPITAL_COMMUNITY)
Admission: RE | Admit: 2022-12-02 | Discharge: 2022-12-02 | Disposition: A | Discharge status: Home / Self Care | Payer: Medicare Other | Source: Skilled Nursing Facility | Attending: Adult Health | Admitting: Adult Health

## 2022-12-02 DIAGNOSIS — I5043 Acute on chronic combined systolic (congestive) and diastolic (congestive) heart failure: Secondary | ICD-10-CM | POA: Insufficient documentation

## 2022-12-02 DIAGNOSIS — R911 Solitary pulmonary nodule: Secondary | ICD-10-CM | POA: Insufficient documentation

## 2022-12-02 LAB — BASIC METABOLIC PANEL
Anion gap: 13 (ref 5–15)
BUN: 31 mg/dL — ABNORMAL HIGH (ref 8–23)
CO2: 30 mmol/L (ref 22–32)
Calcium: 9 mg/dL (ref 8.9–10.3)
Chloride: 98 mmol/L (ref 98–111)
Creatinine, Ser: 0.82 mg/dL (ref 0.61–1.24)
GFR, Estimated: >60 mL/min (ref >60)
Glucose, Bld: 103 mg/dL — ABNORMAL HIGH (ref 70–99)
Potassium: 3.5 mmol/L (ref 3.5–5.1)
Sodium: 141 mmol/L (ref 135–145)

## 2022-12-02 NOTE — Assessment & Plan Note (Addendum)
Current H/H10.4/34.2 with MCV 100.6.  Folic acid and 123456 levels were normal.  No bleeding dyscrasias reported at the SNF.  Continue to monitor.

## 2022-12-02 NOTE — Assessment & Plan Note (Addendum)
He is oriented only to self and can provide no history.  His family is very attentive which is invaluable in reference to his compliance with therapy

## 2022-12-02 NOTE — Patient Instructions (Signed)
See assessment and plan under each diagnosis in the problem list and acutely for this visit 

## 2022-12-02 NOTE — Assessment & Plan Note (Addendum)
Presently congestive heart failure is compensated as there is no significant peripheral edema ,neck vein distention or hepatojugular reflux.  It is unclear what led to the acute decompensation and any contribution from the moderate-severe aortic regurgitation.  Nutrition will be asked to discuss sodium restriction with the family to prevent any high sodium snacks as a predisposition to recurrence.

## 2022-12-02 NOTE — Assessment & Plan Note (Addendum)
Outpatient CT in 3 months to assess pulmonary nodule status.  He is high risk operative candidate.

## 2022-12-03 ENCOUNTER — Ambulatory Visit: Payer: Medicare Other | Admitting: Family Medicine

## 2022-12-03 ENCOUNTER — Other Ambulatory Visit (HOSPITAL_COMMUNITY)
Admission: RE | Admit: 2022-12-03 | Discharge: 2022-12-03 | Disposition: A | Discharge status: Home / Self Care | Payer: Medicare Other | Source: Skilled Nursing Facility | Attending: Adult Health | Admitting: Adult Health

## 2022-12-03 DIAGNOSIS — K5909 Other constipation: Secondary | ICD-10-CM | POA: Insufficient documentation

## 2022-12-03 DIAGNOSIS — D649 Anemia, unspecified: Secondary | ICD-10-CM | POA: Insufficient documentation

## 2022-12-03 DIAGNOSIS — I5043 Acute on chronic combined systolic (congestive) and diastolic (congestive) heart failure: Secondary | ICD-10-CM | POA: Insufficient documentation

## 2022-12-03 DIAGNOSIS — G8929 Other chronic pain: Secondary | ICD-10-CM | POA: Insufficient documentation

## 2022-12-03 DIAGNOSIS — J9600 Acute respiratory failure, unspecified whether with hypoxia or hypercapnia: Secondary | ICD-10-CM | POA: Insufficient documentation

## 2022-12-03 DIAGNOSIS — I110 Hypertensive heart disease with heart failure: Secondary | ICD-10-CM | POA: Insufficient documentation

## 2022-12-03 DIAGNOSIS — I5042 Chronic combined systolic (congestive) and diastolic (congestive) heart failure: Secondary | ICD-10-CM | POA: Insufficient documentation

## 2022-12-03 LAB — CBC
HCT: 38.6 % — ABNORMAL LOW (ref 39.0–52.0)
Hemoglobin: 11.4 g/dL — ABNORMAL LOW (ref 13.0–17.0)
MCH: 29.7 pg (ref 26.0–34.0)
MCHC: 29.5 g/dL — ABNORMAL LOW (ref 30.0–36.0)
MCV: 100.5 fL — ABNORMAL HIGH (ref 80.0–100.0)
Platelets: 257 10*3/uL (ref 150–400)
RBC: 3.84 MIL/uL — ABNORMAL LOW (ref 4.22–5.81)
RDW: 15.7 % — ABNORMAL HIGH (ref 11.5–15.5)
WBC: 6.8 10*3/uL (ref 4.0–10.5)
nRBC: 0 % (ref 0.0–0.2)

## 2022-12-05 ENCOUNTER — Other Ambulatory Visit: Payer: Self-pay | Admitting: Family Medicine

## 2022-12-08 ENCOUNTER — Non-Acute Institutional Stay (SKILLED_NURSING_FACILITY): Payer: Medicare Other | Admitting: Adult Health

## 2022-12-08 ENCOUNTER — Encounter: Payer: Self-pay | Admitting: Adult Health

## 2022-12-08 DIAGNOSIS — I5042 Chronic combined systolic (congestive) and diastolic (congestive) heart failure: Secondary | ICD-10-CM

## 2022-12-08 DIAGNOSIS — I4891 Unspecified atrial fibrillation: Secondary | ICD-10-CM | POA: Diagnosis not present

## 2022-12-08 DIAGNOSIS — I110 Hypertensive heart disease with heart failure: Secondary | ICD-10-CM

## 2022-12-08 NOTE — Progress Notes (Unsigned)
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 There are no reports of uncontrolled pain. He is getting out of bed daily. There are no reports of anxiety or agitation.   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    Essential hypertension    Hematuria 04/30/2019   New complaint, does nott have pain of kidney stones, needs to submit UA for testing asap and needs urology eval   Hyperlipidemia    IGT (impaired glucose tolerance)    Obesity    OSA (obstructive sleep apnea)    Seizures ([place])    Age 85 or 3, no meds and no reoccurances   Skin lesion 12/07/2012   Skin tag 11/16/2011   Stroke [facility])    Umbilical hernia     Past Surgical History:  Procedure Laterality Date   BOWEL RESECTION N/A 10/22/2022   Procedure: SMALL BOWEL RESECTION;  Surgeon: Rusty Aus, DO;  Location: [facility] ORS;  Service: General;  Laterality: N/A;   COLONOSCOPY N/A 07/15/2015   Procedure: COLONOSCOPY;  Surgeon: Aviva Signs, MD;  Location: [facility] ENDO SUITE;   Service: Gastroenterology;  Laterality: N/A;   CORONARY ARTERY BYPASS GRAFT  09/13/2002   x4   HERNIA REPAIR     INCISIONAL HERNIA REPAIR N/A 01/01/2013   Procedure: HERNIA REPAIR INCISIONAL;  Surgeon: Jamesetta So, MD;  Location: [facility] OR[facility];  Service: General;  Laterality: N/A;  umbilical area   INSERTION OF MESH N/A 01/01/2013   Procedure: INSERTION OF MESH;  Surgeon: Jamesetta So, MD;  Location: [facility] OR[facility];  Service: General;  Laterality: N/A;   LAPAROTOMY N/A 10/22/2022   Procedure: EXPLORATORY LAPAROTOMY;  Surgeon: Rusty Aus, DO;  Location: [facility];  Service: General;  Laterality: N/A;   Left inguinal  hernia repair  09/14/2003   OMENTECTOMY N/A 10/22/2022   Procedure: OMENTECTOMY;  Surgeon: Rusty Aus, DO;  Location: [facility];  Service: General;  Laterality: N/A;   [name]  09/13/2005   VENTRAL HERNIA REPAIR N/A 10/22/2022   Procedure: HERNIA REPAIR VENTRAL ADULT;  Surgeon: Rusty Aus, DO;  Location: [facility];  Service: General;  Laterality: N/A;    Social History   Socioeconomic History   Marital status: Married    Spouse name: Jolayne Haines   Number of children: 2   Years of education: Not on file   Highest education level: 12th grade  Occupational History   Occupation: retired   Tobacco  Use   Smoking status: Former    Packs/day: 0.50    Years: 5.00    Additional pack years: 0.00    Total pack years: 2.50    Types: Cigarettes    Quit date: 12/27/1980    Years since quitting: 41.9   Smokeless tobacco: Former    Types: Chew    Quit date: 01/24/2013  Vaping Use   Vaping Use: Never used  Substance and Sexual Activity   Alcohol use: No    Alcohol/week: 0.0 standard drinks of alcohol   Drug use: No   Sexual activity: Not Currently    Birth control/protection: None  Other Topics Concern   Not on file  Social History Narrative   Not on file   Social Determinants of Health   Financial Resource Strain: Low Risk  (06/28/2022)   Overall  Financial Resource Strain (CARDIA)    Difficulty of Paying Living Expenses: Not hard at all  Food Insecurity: No Food Insecurity (11/23/2022)   Hunger Vital Sign    Worried About Running Out of Food in the Last Year: Never true    Ran Out of Food in the Last Year: Never true  Transportation Needs: No Transportation Needs (11/23/2022)   PRAPARE - Hydrologist (Medical): No    Lack of Transportation (Non-Medical): No  Physical Activity: Inactive (06/28/2022)   Exercise Vital Sign    Days of Exercise per Week: 0 days    Minutes of Exercise per Session: 0 min  Stress: No Stress Concern Present (06/28/2022)   [place]    Feeling of Stress : Not at all  Social Connections: Moderately Integrated (06/28/2022)   Social Connection and Isolation Panel [NHANES]    Frequency of Communication with Friends and Family: More than three times a week    Frequency of Social Gatherings with Friends and Family: Three times a week    Attends Religious Services: More than 4 times per year    Active Member of Clubs or Organizations: No    Attends Archivist Meetings: Never    Marital Status: Married  Human resources officer Violence: Not At Risk (11/23/2022)   Humiliation, Afraid, Rape, and Kick questionnaire    Fear of Current or Ex-Partner: No    Emotionally Abused: No    Physically Abused: No    Sexually Abused: No   Family History  Problem Relation Age of Onset   Pancreatic cancer Mother    Heart disease Father    Prostate cancer Father    Stroke Brother    Heart disease Brother    Heart disease Brother    COPD Sister    Actinic keratosis Sister    Obesity Son    Anxiety disorder Son       VITAL SIGNS BP 107/69   Pulse 65   Temp 97.8 F (36.6 C)   Resp 20   Ht 5\' 9"  (1.753 m)   Wt 172 lb 6.4 oz (78.2 kg)   SpO2 100%   BMI 25.46 kg/m   Outpatient Encounter Medications as of 12/08/2022   Medication Sig   acetaminophen (TYLENOL) 325 MG tablet Take 2 tablets (650 mg total) by mouth every 4 (four) hours as needed for fever, mild pain, moderate pain or headache.   amiodarone (PACERONE) 200 MG tablet Take 1 tablet (200 mg total) by mouth daily.   apixaban (ELIQUIS) 5 MG TABS tablet Take 1 tablet (5 mg total)  by mouth 2 (two) times daily.   furosemide (LASIX) 40 MG tablet Take 1 tablet (40 mg total) by mouth 2 (two) times daily.   lidocaine 4 % Place 1 patch onto the skin daily. Apply to left hip every morning - remove at hs   losartan (COZAAR) 25 MG tablet Take 0.5 tablets (12.5 mg total) by mouth daily.   miconazole (MICOTIN) 2 % cream Apply 1 Application topically 2 (two) times daily.   polyethylene glycol (MIRALAX / GLYCOLAX) 17 g packet Take 17 g by mouth daily.   potassium chloride SA (KLOR-CON M) 20 MEQ tablet Take 1 tablet (20 mEq total) by mouth 2 (two) times daily.   simvastatin (ZOCOR) 20 MG tablet TAKE 1 TABLET BY MOUTH EVERYDAY AT BEDTIME   UNABLE TO FIND DYSPHAGIA 3 Diet / Thin liquids   No facility-administered encounter medications on file as of 12/08/2022.     SIGNIFICANT DIAGNOSTIC EXAMS  PREVIOUS   11-23-22: 2-d echo 1. Limited Echo for LVEF assessment   2. Left ventricular ejection fraction, by estimation, is 20 to 25%. The  left ventricle has severely decreased function. The left ventricle  demonstrates regional wall motion abnormalities (see scoring  diagram/findings for description). The left  ventricular internal cavity size was moderately dilated. There is mild  left ventricular hypertrophy.   3. The aortic valve is tricuspid. There is severe calcifcation of the  aortic valve. Aortic valve regurgitation is moderate to severe. Aortic  valve stenosis is at least moderate due to severe restriction in the  leaflet mobility.   4. The inferior vena cava is dilated in size with <50% respiratory  variability, suggesting right atrial pressure of 15 mmHg.    11-23-22: ct of chest:  Moderate right pleural effusion with adjacent atelectasis and patchy airspace disease in the right upper lobe, which corresponds to the nodular opacity seen on recent chest radiograph. Additional patchy opacities in the left upper lobe and loculated effusion along the left oblique fissure. Findings are favored to represent multifocal pneumonia. Recommend follow-up chest CT in 3 months to ensure resolution. Small left basilar pleural effusion with adjacent thickened and calcified pleura consistent with a chronic process. Cardiomegaly with low-density of the cardiac blood pool, can be seen in the setting of anemia.       Mildly dilated main and branch pulmonary arteries, suggestive of pulmonary hypertension. Trace upper abdominal ascites. Mildly dilated ascending aorta measuring up to 4.1 cm  11-23-22 ct of abdomen and pelvis:  1. New calcification is seen bladder measuring 8 mm, likely a bladder calculus. 2. Unchanged subcapsular fluid collection of the left kidney, likely a hematoma secondary to a ruptured cyst.  3. Postsurgical changes of the anterior abdomen with new fluid collection, likely a postop seroma. 4. Aortic Atherosclerosis  NO NEW EXAMS   LABS REVIEWED;   11-22-22: wbc 9.4; hgb 9.8; hct 32.6; mcv 101.2 plt 216; glucose 108; bun 19; creat 0.69; k+ 2.9; na++ 140; ca 8.3 gfr >60; protein 6.0 albumin 2.8; BNP 2168.0 11-23-22; wbc 8.0; hgb 10.3; hct 34.0; mcv 101.2 plt 196; glucose 108; bun 19; creat 0.69; k+ 2.9; na++ 140; ca 8.3; gfr >60; vitamin B 12: 462; folate 24.3; phos 3.2 mag 1.8 11-29-22: glucose  103; bun 25; creat 0.88; k+ 3.3; na++ 140; ca 8.7; gfr >60  NO NEW LABS.   Review of Systems  Constitutional:  Negative for malaise/fatigue.  Respiratory:  Negative for cough and shortness of breath.   Cardiovascular:  Negative for  chest pain, palpitations and leg swelling.  Gastrointestinal:  Negative for abdominal pain, constipation and heartburn.   Musculoskeletal:  Negative for back pain, joint pain and myalgias.  Skin: Negative.   Neurological:  Negative for dizziness.  Psychiatric/Behavioral:  The patient is not nervous/anxious.    Physical Exam Constitutional:      General: He is not in acute distress.    Appearance: He is well-developed. He is not diaphoretic.  Neck:     Thyroid: No thyromegaly.  Cardiovascular:     Rate and Rhythm: Normal rate and regular rhythm.     Pulses: Normal pulses.     Heart sounds: Murmur heard.  Pulmonary:     Effort: Pulmonary effort is normal. No respiratory distress.     Breath sounds: Normal breath sounds.  Abdominal:     General: Bowel sounds are normal. There is no distension.     Palpations: Abdomen is soft.     Tenderness: There is no abdominal tenderness.  Musculoskeletal:     Cervical back: Neck supple.     Right lower leg: No edema.     Left lower leg: No edema.     Comments: Left hemiplegia; left upper extremity contracture present; with mild edema present   Lymphadenopathy:     Cervical: No cervical adenopathy.  Skin:    General: Skin is warm and dry.  Neurological:     Mental Status: He is alert. Mental status is at baseline.  Psychiatric:        Mood and Affect: Mood normal.      ASSESSMENT/ PLAN:  TODAY  Chronic combined systolic and diastolic CHF (congestive heart failure) EF 20-25%: will continue  lasix 40 mg twice daily   2. Benign hypertension with coincident congestive heart failure: b/p 107/69 will continue cozaar 12.5 mg daily   3. Atrial fibrillation with rapid ventricular response: heart rate stable is on amiodarone 200 mg daily for rate control and eliquis 5 mg twice daily   PREVIOUS   4. OSA on CPAP: will monitor   5. Right upper lobe pulmonary nodule: will repeat ct scan in 3 months  6. 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 Is without change  7.  Neurocognitive deficits 11-03-22: SLUMS 12/30  8.  Hyperlipidemia LDL goal <70: will continue zocor 20 mg daily   9. Hypoalbuminemia due to protein calorie malnutrition: protein 6.0 albumin 2.7 will begin prostat awc 30 mL with meals   10. Other chronic pain: will continue lidoderm to left hip  11. Chronic constipation: will continue miralax daily   12. Chronic anemia: hgb 10.3    Ok Edwards NP [facility]  call [phone]

## 2022-12-14 NOTE — Progress Notes (Unsigned)
Cardiology Office Note  Date: 12/15/2022   ID: Broden Mazin Ringley, DOB 23-Feb-1941, MRN [id number]  History of Present Illness: Craig Fields is an 82 y.o. male last seen in the office back in June 2022.  I reviewed extensive interval records, he was recently hospitalized and seen by our cardiology team with newly documented HFrEF, LVEF 20 to 25% and also low-flow, low gradient severe aortic stenosis (mean AV gradient 27 mmHg and dimensionless index 0.20) with moderate aortic regurgitation.  He is currently in the [facility] undergoing rehabilitation.  He is here today with his wife and daughter.  He is in a wheelchair, not able to stand without assistance.  Fluid status has improved significantly on diuretics.  I reviewed his medications, these were clarified with nursing.  He has not been on a beta-blocker recently, otherwise on low-dose losartan, amiodarone, twice daily Lasix with potassium supplement, Eliquis, and Zocor.  I reviewed his most recent lab work from late March.  Today we discussed his overall cardiac situation.  I do not think he is a good candidate for TAVR at this time in light of present functional capacity.  Plan will be to try and optimize GDMT and see how he does with physical therapy.  Physical Exam: VS:  BP 120/74   Pulse 67   Ht 5\' 7"  (1.702 m)   Wt 174 lb (78.9 kg)   SpO2 92%   BMI 27.25 kg/m , BMI Body mass index is 27.25 kg/m.  Wt Readings from Last 3 Encounters:  12/15/22 174 lb (78.9 kg)  12/08/22 172 lb 6.4 oz (78.2 kg)  11/30/22 187 lb 9.6 oz (85.1 kg)    General: Patient appears comfortable at rest. HEENT: Conjunctiva and lids normal. Neck: Supple, no elevated JVP or carotid bruits. Lungs: Clear to auscultation, nonlabored breathing at rest. Cardiac: Regular rate and rhythm, no S3, 2/6 systolic murmur. Abdomen: Soft, bowel sounds present. Extremities: No pitting edema.  ECG:  An ECG dated 11/22/2022 was personally reviewed today and demonstrated:   Sinus rhythm with LVH and IVCD, nonspecific ST changes.  Labwork: 01/01/2022: TSH 3.060 11/22/2022: B Natriuretic Peptide 2,168.0 11/25/2022: ALT 15; AST 18; Magnesium 1.7 12/02/2022: BUN 31; Creatinine, Ser 0.82; Potassium 3.5; Sodium 141 12/03/2022: Hemoglobin 11.4; Platelets 257     Component Value Date/Time   CHOL 128 07/02/2022 0941   TRIG 56 10/23/2022 0430   HDL 45 07/02/2022 0941   CHOLHDL 2.8 07/02/2022 0941   CHOLHDL 3.5 01/01/2020 1620   VLDL 38 (H) 04/19/2017 0914   LDLCALC 61 07/02/2022 0941   LDLCALC 78 01/01/2020 1620   Other Studies Reviewed Today:  Echocardiogram 10/24/2022:  1. Left ventricular ejection fraction, by estimation, is 20 to 25%. The  left ventricle has severely decreased function. The left ventricle  demonstrates regional wall motion abnormalities (see scoring  diagram/findings for description). Left ventricular  diastolic parameters are consistent with Grade II diastolic dysfunction  (pseudonormalization).   2. Right ventricular systolic function is mildly reduced. The right  ventricular size is normal. There is mildly elevated pulmonary artery  systolic pressure. The estimated right ventricular systolic pressure is  123XX123 mmHg.   3. Left atrial size was severely dilated.   4. Right atrial size was mildly dilated.   5. The mitral valve is degenerative. Mild mitral valve regurgitation. No  evidence of mitral stenosis.   6. The aortic valve is abnormal. There is severe calcifcation of the  aortic valve. Aortic valve regurgitation is moderate. Moderate  aortic  valve stenosis. Aortic valve area, by VTI measures 0.63 cm. Aortic valve  mean gradient measures 27.0 mmHg. Aortic   valve Vmax measures 3.40 m/s. DI 0.20, SVI 24 ml/m2, possible low flow  low gradient AS.   7. The inferior vena cava is dilated in size with <50% respiratory  variability, suggesting right atrial pressure of 15 mmHg.   Limited echocardiogram with [facility] 11/23/2022:  1. Limited  Echo for LVEF assessment   2. Left ventricular ejection fraction, by estimation, is 20 to 25%. The  left ventricle has severely decreased function. The left ventricle  demonstrates regional wall motion abnormalities (see scoring  diagram/findings for description). The left  ventricular internal cavity size was moderately dilated. There is mild  left ventricular hypertrophy.   3. The aortic valve is tricuspid. There is severe calcifcation of the  aortic valve. Aortic valve regurgitation is moderate to severe. Aortic  valve stenosis is at least moderate due to severe restriction in the  leaflet mobility.   4. The inferior vena cava is dilated in size with <50% respiratory  variability, suggesting right atrial pressure of 15 mmHg.   Assessment and Plan:  1.  Degenerative calcific aortic stenosis, suspect low-flow/low gradient severe stenosis with recent echocardiogram demonstrating mean AV gradient 27 mmHg and dimensionless index 0.20.  Also has associated moderate aortic regurgitation.  Not optimal candidate for TAVR now particularly in light of present functional capacity.  Still working with PT and hopes to go [facility] from [facility], although at baseline he has residual deficits from prior stroke and is fairly limited.  We will plan to revisit this depending on how he does.  2.  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  We discussed recent echocardiogram findings, plan to optimize GDMT as best we can.  For now continue Cozaar 12.5 mg daily, decrease Lasix to 40 mg daily with KCl 20 mEq daily, add Aldactone 12.5 mg daily.  Would be concerned about adding SGLT2 inhibitor with increased infection risk due to limited functional status.  Might be able to add low-dose beta-blocker next depending on heart rate.  Transitioning from Cozaar to [facility] will largely be dependent on subsequent blood pressures.  3.  Paroxysmal  atrial fibrillation, CHA2DS2-VASc score is 7.  He is on Eliquis for stroke prophylaxis, no reported spontaneous bleeding problems.  Maintaining sinus rhythm on amiodarone at this point.  3.  Multivessel CAD status post CABG in 2004 with LIMA to LAD, SVG to ramus intermedius, SVG to OM 2, and SVG to PDA.  No obvious angina reported at low-level activity.  He continues on statin therapy.  Last LDL 61.  4.  History of stroke with left-sided hemiparesis.  5.  Essential hypertension by history.  Blood pressure well-controlled today.  6.  Mixed hyperlipidemia.  He continues on Zocor.  Disposition:  Follow up  1 month.  Signed, Satira Sark, M.D., F.A.C.C. [place] at [facility]

## 2022-12-15 ENCOUNTER — Ambulatory Visit: Payer: Medicare Other | Attending: Cardiology | Admitting: Cardiology

## 2022-12-15 ENCOUNTER — Non-Acute Institutional Stay (SKILLED_NURSING_FACILITY): Payer: Medicare Other | Admitting: Adult Health

## 2022-12-15 ENCOUNTER — Encounter: Payer: Self-pay | Admitting: Adult Health

## 2022-12-15 ENCOUNTER — Encounter: Payer: Self-pay | Admitting: Cardiology

## 2022-12-15 VITALS — BP 120/74 | HR 67 | Ht 67.0 in | Wt 174.0 lb

## 2022-12-15 DIAGNOSIS — I480 Paroxysmal atrial fibrillation: Secondary | ICD-10-CM | POA: Diagnosis present

## 2022-12-15 DIAGNOSIS — I350 Nonrheumatic aortic (valve) stenosis: Secondary | ICD-10-CM | POA: Insufficient documentation

## 2022-12-15 DIAGNOSIS — R29818 Other symptoms and signs involving the nervous system: Secondary | ICD-10-CM | POA: Diagnosis not present

## 2022-12-15 DIAGNOSIS — I5020 Unspecified systolic (congestive) heart failure: Secondary | ICD-10-CM | POA: Diagnosis not present

## 2022-12-15 DIAGNOSIS — R4189 Other symptoms and signs involving cognitive functions and awareness: Secondary | ICD-10-CM | POA: Diagnosis not present

## 2022-12-15 DIAGNOSIS — R634 Abnormal weight loss: Secondary | ICD-10-CM | POA: Diagnosis not present

## 2022-12-15 DIAGNOSIS — I25119 Atherosclerotic heart disease of native coronary artery with unspecified angina pectoris: Secondary | ICD-10-CM | POA: Insufficient documentation

## 2022-12-15 DIAGNOSIS — Z8673 Personal history of transient ischemic attack (TIA), and cerebral infarction without residual deficits: Secondary | ICD-10-CM

## 2022-12-15 DIAGNOSIS — I10 Essential (primary) hypertension: Secondary | ICD-10-CM | POA: Diagnosis not present

## 2022-12-15 MED ORDER — FUROSEMIDE 40 MG PO TABS: 40.0000 mg | ORAL_TABLET | Freq: Every day | ORAL | 3 refills | Status: DC

## 2022-12-15 MED ORDER — POTASSIUM CHLORIDE CRYS ER 20 MEQ PO TBCR: 20.0000 meq | EXTENDED_RELEASE_TABLET | Freq: Every day | ORAL | 3 refills | Status: DC

## 2022-12-15 MED ORDER — SPIRONOLACTONE 25 MG PO TABS: 12.5000 mg | ORAL_TABLET | Freq: Every day | ORAL | 3 refills | Status: DC

## 2022-12-15 NOTE — Patient Instructions (Signed)
Medication Instructions:  Your physician has recommended you make the following change in your medication:   -Decrease Lasix to 40 mg tablets once daily -Decrease Potassium to 20 meq once daily -Start Aldactone 12.5 mg (0.5 tablet) once daily   *If you need a refill on your cardiac medications before your next appointment, please call your pharmacy*   Lab Work: In 7-10 Days:  -BMET  If you have labs (blood work) drawn today and your tests are completely normal, you will receive your results only by: [place] (if you have MyChart) OR A paper copy in the mail If you have any lab test that is abnormal or we need to change your treatment, we will call you to review the results.   Testing/Procedures: None   Follow-Up: At [facility], you and your health needs are our priority.  As part of our continuing mission to provide you with exceptional heart care, we have created designated Provider Care Teams.  These Care Teams include your primary Cardiologist (physician) and Advanced Practice Providers (APPs -  Physician Assistants and Nurse Practitioners) who all work together to provide you with the care you need, when you need it.  We recommend signing up for the patient portal called "MyChart".  Sign up information is provided on this After Visit Summary.  MyChart is used to connect with patients for Virtual Visits (Telemedicine).  Patients are able to view lab/test results, encounter notes, upcoming appointments, etc.  Non-urgent messages can be sent to your provider as well.   To learn more about what you can do with MyChart, go to [email or web address].    Your next appointment:   1 month(s)  Provider:   Rozann Lesches, MD    Other Instructions

## 2022-12-15 NOTE — Progress Notes (Signed)
Location:  [facility] Room Number: 136P Place of Service:  SNF (31)   CODE STATUS: FULL CODE  Allergies  Allergen Reactions   Bee Venom Swelling   Niacin Other (See Comments)    Muscle aches    Chief Complaint  Patient presents with   Acute Visit    Weight issues    HPI:  He is losing weight. His admission weight was 211 pounds; his current weight is 174.6 pounds. He had a hospitalization in March of this year and lost a large amount of fluid. He continues with a poor appetite; his wife does assist with meals. His goal remains to return back home. He is on prostat for nutritional support.   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    Essential hypertension    Hematuria 04/30/2019   New complaint, does nott have pain of kidney stones, needs to submit UA for testing asap and needs urology eval   Hyperlipidemia    IGT (impaired glucose tolerance)    Obesity    OSA (obstructive sleep apnea)    Seizures    Age 23 or 3, no meds and no reoccurances   Skin lesion 12/07/2012   Skin tag 11/16/2011   Stroke    Umbilical hernia     Past Surgical History:  Procedure Laterality Date   BOWEL RESECTION N/A 10/22/2022   Procedure: SMALL BOWEL RESECTION;  Surgeon: Lewie Chamber, DO;  Location: [facility];  Service: General;  Laterality: N/A;   COLONOSCOPY N/A 07/15/2015   Procedure: COLONOSCOPY;  Surgeon: Franky Macho, MD;  Location: [facility];  Service: Gastroenterology;  Laterality: N/A;   CORONARY ARTERY BYPASS GRAFT  09/13/2002   x4   HERNIA REPAIR     INCISIONAL HERNIA REPAIR N/A 01/01/2013   Procedure: HERNIA REPAIR INCISIONAL;  Surgeon: Dalia Heading, MD;  Location: [facility];  Service: General;  Laterality: N/A;  umbilical area   INSERTION OF MESH N/A  01/01/2013   Procedure: INSERTION OF MESH;  Surgeon: Dalia Heading, MD;  Location: [facility];  Service: General;  Laterality: N/A;   LAPAROTOMY N/A 10/22/2022   Procedure: EXPLORATORY LAPAROTOMY;  Surgeon: Lewie Chamber, DO;  Location: [facility];  Service: General;  Laterality: N/A;   Left inguinal  hernia repair  09/14/2003   OMENTECTOMY N/A 10/22/2022   Procedure: OMENTECTOMY;  Surgeon: Lewie Chamber, DO;  Location: [facility];  Service: General;  Laterality: N/A;   [name]  09/13/2005   VENTRAL HERNIA REPAIR N/A 10/22/2022   Procedure: HERNIA REPAIR VENTRAL ADULT;  Surgeon: Lewie Chamber, DO;  Location: [facility];  Service: General;  Laterality: N/A;    Social History   Socioeconomic History   Marital status: Married    Spouse name: Karen Kitchens   Number of children: 2   Years of education: Not on file   Highest education level: 12th grade  Occupational History   Occupation: retired   Tobacco Use   Smoking status: Former    Packs/day: 0.50    Years: 5.00    Additional pack years: 0.00    Total pack years: 2.50    Types: Cigarettes    Quit date: 12/27/1980    Years since quitting: 41.9  Smokeless tobacco: Former    Types: Chew    Quit date: 01/24/2013  Vaping Use   Vaping Use: Never used  Substance and Sexual Activity   Alcohol use: No    Alcohol/week: 0.0 standard drinks of alcohol   Drug use: No   Sexual activity: Not Currently    Birth control/protection: None  Other Topics Concern   Not on file  Social History Narrative   Not on file   Social Determinants of Health   Financial Resource Strain: Low Risk  (06/28/2022)   Overall Financial Resource Strain (CARDIA)    Difficulty of Paying Living Expenses: Not hard at all  Food Insecurity: No Food Insecurity (11/23/2022)   Hunger Vital Sign    Worried About Running Out of Food in the Last Year: Never true    Ran Out of Food in the Last Year: Never true  Transportation Needs: No Transportation  Needs (11/23/2022)   PRAPARE - Administrator, Civil Service (Medical): No    Lack of Transportation (Non-Medical): No  Physical Activity: Inactive (06/28/2022)   Exercise Vital Sign    Days of Exercise per Week: 0 days    Minutes of Exercise per Session: 0 min  Stress: No Stress Concern Present (06/28/2022)   [employer] of Occupational Health - Occupational Stress Questionnaire    Feeling of Stress : Not at all  Social Connections: Moderately Integrated (06/28/2022)   Social Connection and Isolation Panel [NHANES]    Frequency of Communication with Friends and Family: More than three times a week    Frequency of Social Gatherings with Friends and Family: Three times a week    Attends Religious Services: More than 4 times per year    Active Member of Clubs or Organizations: No    Attends Banker Meetings: Never    Marital Status: Married  Catering manager Violence: Not At Risk (11/23/2022)   Humiliation, Afraid, Rape, and Kick questionnaire    Fear of Current or Ex-Partner: No    Emotionally Abused: No    Physically Abused: No    Sexually Abused: No   Family History  Problem Relation Age of Onset   Pancreatic cancer Mother    Heart disease Father    Prostate cancer Father    Stroke Brother    Heart disease Brother    Heart disease Brother    COPD Sister    Actinic keratosis Sister    Obesity Son    Anxiety disorder Son       VITAL SIGNS BP 120/72   Pulse 78   Temp 97.6 F (36.4 C)   Resp 20   Ht 5\' 7"  (1.702 m)   Wt 174 lb (78.9 kg)   SpO2 98%   BMI 27.25 kg/m   Outpatient Encounter Medications as of 12/15/2022  Medication Sig   acetaminophen (TYLENOL) 325 MG tablet Take 2 tablets (650 mg total) by mouth every 4 (four) hours as needed for fever, mild pain, moderate pain or headache.   amiodarone (PACERONE) 200 MG tablet Take 1 tablet (200 mg total) by mouth daily.   apixaban (ELIQUIS) 5 MG TABS tablet Take 1 tablet (5 mg total) by  mouth 2 (two) times daily.   furosemide (LASIX) 40 MG tablet Take 40 mg by mouth 2 (two) times daily.   lidocaine 4 % Place 1 patch onto the skin daily. Apply to left hip every morning - remove at hs   losartan (COZAAR) 25 MG tablet Take  0.5 tablets (12.5 mg total) by mouth daily.   mirtazapine (REMERON) 7.5 MG tablet Take 7.5 mg by mouth at bedtime.   polyethylene glycol (MIRALAX / GLYCOLAX) 17 g packet Take 17 g by mouth daily.   potassium chloride SA (KLOR-CON M) 20 MEQ tablet Take 20 mEq by mouth 2 (two) times daily.   UNABLE TO FIND DYSPHAGIA 3 Diet / Thin liquids   [DISCONTINUED] furosemide (LASIX) 40 MG tablet Take 1 tablet (40 mg total) by mouth daily.   [DISCONTINUED] metoprolol tartrate (LOPRESSOR) 50 MG tablet Take 50 mg by mouth daily. (Patient not taking: Reported on 12/15/2022)   [DISCONTINUED] miconazole (MICOTIN) 2 % cream Apply 1 Application topically 2 (two) times daily.   [DISCONTINUED] potassium chloride SA (KLOR-CON M) 20 MEQ tablet Take 1 tablet (20 mEq total) by mouth daily.   [DISCONTINUED] simvastatin (ZOCOR) 20 MG tablet TAKE 1 TABLET BY MOUTH EVERYDAY AT BEDTIME   [DISCONTINUED] spironolactone (ALDACTONE) 25 MG tablet Take 0.5 tablets (12.5 mg total) by mouth daily.   No facility-administered encounter medications on file as of 12/15/2022.     SIGNIFICANT DIAGNOSTIC EXAMS  PREVIOUS   11-23-22: 2-d echo 1. Limited Echo for LVEF assessment   2. Left ventricular ejection fraction, by estimation, is 20 to 25%. The  left ventricle has severely decreased function. The left ventricle  demonstrates regional wall motion abnormalities (see scoring  diagram/findings for description). The left  ventricular internal cavity size was moderately dilated. There is mild  left ventricular hypertrophy.   3. The aortic valve is tricuspid. There is severe calcifcation of the  aortic valve. Aortic valve regurgitation is moderate to severe. Aortic  valve stenosis is at least moderate due to  severe restriction in the  leaflet mobility.   4. The inferior vena cava is dilated in size with <50% respiratory  variability, suggesting right atrial pressure of 15 mmHg.   11-23-22: ct of chest:  Moderate right pleural effusion with adjacent atelectasis and patchy airspace disease in the right upper lobe, which corresponds to the nodular opacity seen on recent chest radiograph. Additional patchy opacities in the left upper lobe and loculated effusion along the left oblique fissure. Findings are favored to represent multifocal pneumonia. Recommend follow-up chest CT in 3 months to ensure resolution. Small left basilar pleural effusion with adjacent thickened and calcified pleura consistent with a chronic process. Cardiomegaly with low-density of the cardiac blood pool, can be seen in the setting of anemia.       Mildly dilated main and branch pulmonary arteries, suggestive of pulmonary hypertension. Trace upper abdominal ascites. Mildly dilated ascending aorta measuring up to 4.1 cm  11-23-22 ct of abdomen and pelvis:  1. New calcification is seen bladder measuring 8 mm, likely a bladder calculus. 2. Unchanged subcapsular fluid collection of the left kidney, likely a hematoma secondary to a ruptured cyst.  3. Postsurgical changes of the anterior abdomen with new fluid collection, likely a postop seroma. 4. Aortic Atherosclerosis  NO NEW EXAMS   LABS REVIEWED;   11-22-22: wbc 9.4; hgb 9.8; hct 32.6; mcv 101.2 plt 216; glucose 108; bun 19; creat 0.69; k+ 2.9; na++ 140; ca 8.3 gfr >60; protein 6.0 albumin 2.8; BNP 2168.0 11-23-22; wbc 8.0; hgb 10.3; hct 34.0; mcv 101.2 plt 196; glucose 108; bun 19; creat 0.69; k+ 2.9; na++ 140; ca 8.3; gfr >60; vitamin B 12: 462; folate 24.3; phos 3.2 mag 1.8 11-29-22: glucose  103; bun 25; creat 0.88; k+ 3.3; na++ 140; ca 8.7;  gfr >60  NO NEW LABS.    Review of Systems  Constitutional:  Negative for malaise/fatigue.  Respiratory:  Negative for cough and  shortness of breath.   Cardiovascular:  Negative for chest pain, palpitations and leg swelling.  Gastrointestinal:  Negative for abdominal pain, constipation and heartburn.  Musculoskeletal:  Negative for back pain, joint pain and myalgias.  Skin: Negative.   Neurological:  Negative for dizziness.  Psychiatric/Behavioral:  The patient is not nervous/anxious.    Physical Exam Constitutional:      General: He is not in acute distress.    Appearance: He is well-developed. He is not diaphoretic.  Neck:     Thyroid: No thyromegaly.  Cardiovascular:     Rate and Rhythm: Normal rate and regular rhythm.     Pulses: Normal pulses.     Heart sounds: Murmur heard.  Pulmonary:     Effort: Pulmonary effort is normal. No respiratory distress.     Breath sounds: Normal breath sounds.  Abdominal:     General: Bowel sounds are normal. There is no distension.     Palpations: Abdomen is soft.     Tenderness: There is no abdominal tenderness.  Musculoskeletal:     Cervical back: Neck supple.     Left lower leg: Edema present.     Comments: Left hemiplegia; left upper extremity contracture present; with mild edema present   Lymphadenopathy:     Cervical: No cervical adenopathy.  Skin:    General: Skin is warm and dry.  Neurological:     Mental Status: He is alert. Mental status is at baseline.  Psychiatric:        Mood and Affect: Mood normal.      ASSESSMENT/ PLAN:  TODAY  Weight loss unintentional History of CVA (cerebrovascular accident) Neurocognitive deficits   Will stop zocor due to his advanced age Will stop vitamin D Will begin remeron 75 mg nightly for 30 days,    Synthia Innocent NP [facility]  call [phone]

## 2022-12-21 DIAGNOSIS — I5043 Acute on chronic combined systolic (congestive) and diastolic (congestive) heart failure: Secondary | ICD-10-CM | POA: Insufficient documentation

## 2022-12-21 DIAGNOSIS — R634 Abnormal weight loss: Secondary | ICD-10-CM | POA: Insufficient documentation

## 2022-12-22 ENCOUNTER — Inpatient Hospital Stay: Admission: RE | Admit: 2022-12-22 | Payer: Medicare Other | Source: Skilled Nursing Facility | Admitting: Adult Health

## 2022-12-22 ENCOUNTER — Non-Acute Institutional Stay (SKILLED_NURSING_FACILITY): Payer: Medicare Other | Admitting: Adult Health

## 2022-12-22 ENCOUNTER — Other Ambulatory Visit: Payer: Self-pay | Admitting: Adult Health

## 2022-12-22 ENCOUNTER — Other Ambulatory Visit (HOSPITAL_COMMUNITY)
Admission: RE | Admit: 2022-12-22 | Discharge: 2022-12-22 | Disposition: A | Discharge status: Home / Self Care | Payer: Medicare Other | Source: Skilled Nursing Facility | Attending: Adult Health | Admitting: Adult Health

## 2022-12-22 ENCOUNTER — Encounter: Payer: Self-pay | Admitting: Adult Health

## 2022-12-22 DIAGNOSIS — I4891 Unspecified atrial fibrillation: Secondary | ICD-10-CM | POA: Diagnosis not present

## 2022-12-22 DIAGNOSIS — I69954 Hemiplegia and hemiparesis following unspecified cerebrovascular disease affecting left non-dominant side: Secondary | ICD-10-CM | POA: Diagnosis not present

## 2022-12-22 DIAGNOSIS — R4189 Other symptoms and signs involving cognitive functions and awareness: Secondary | ICD-10-CM

## 2022-12-22 DIAGNOSIS — I5042 Chronic combined systolic (congestive) and diastolic (congestive) heart failure: Secondary | ICD-10-CM

## 2022-12-22 DIAGNOSIS — R29818 Other symptoms and signs involving the nervous system: Secondary | ICD-10-CM | POA: Diagnosis not present

## 2022-12-22 DIAGNOSIS — I5043 Acute on chronic combined systolic (congestive) and diastolic (congestive) heart failure: Secondary | ICD-10-CM | POA: Insufficient documentation

## 2022-12-22 LAB — BASIC METABOLIC PANEL
Anion gap: 9 (ref 5–15)
BUN: 34 mg/dL — ABNORMAL HIGH (ref 8–23)
CO2: 32 mmol/L (ref 22–32)
Calcium: 9.1 mg/dL (ref 8.9–10.3)
Chloride: 99 mmol/L (ref 98–111)
Creatinine, Ser: 0.79 mg/dL (ref 0.61–1.24)
GFR, Estimated: >60 mL/min (ref >60)
Glucose, Bld: 104 mg/dL — ABNORMAL HIGH (ref 70–99)
Potassium: 3.8 mmol/L (ref 3.5–5.1)
Sodium: 140 mmol/L (ref 135–145)

## 2022-12-22 MED ORDER — MIRTAZAPINE 7.5 MG PO TABS: 7.5000 mg | ORAL_TABLET | Freq: Every day | ORAL | 0 refills | Status: DC

## 2022-12-22 MED ORDER — AMIODARONE HCL 200 MG PO TABS: 200.0000 mg | ORAL_TABLET | Freq: Every day | ORAL | 0 refills | Status: DC

## 2022-12-22 MED ORDER — SPIRONOLACTONE 25 MG PO TABS: 25.0000 mg | ORAL_TABLET | Freq: Every day | ORAL | 0 refills | Status: DC

## 2022-12-22 MED ORDER — APIXABAN 5 MG PO TABS: 5.0000 mg | ORAL_TABLET | Freq: Two times a day (BID) | ORAL | 0 refills | Status: DC

## 2022-12-22 MED ORDER — FUROSEMIDE 40 MG PO TABS: 40.0000 mg | ORAL_TABLET | Freq: Two times a day (BID) | ORAL | 0 refills | Status: DC

## 2022-12-22 MED ORDER — LOSARTAN POTASSIUM 25 MG PO TABS: 12.5000 mg | ORAL_TABLET | Freq: Every day | ORAL | 0 refills | Status: DC

## 2022-12-22 MED ORDER — POTASSIUM CHLORIDE CRYS ER 20 MEQ PO TBCR: 20.0000 meq | EXTENDED_RELEASE_TABLET | Freq: Two times a day (BID) | ORAL | 0 refills | Status: DC

## 2022-12-22 NOTE — Progress Notes (Unsigned)
Location:  [facility] Room Number: 136P Place of Service:  SNF (31)   CODE STATUS: FULL CODE  Allergies  Allergen Reactions   Bee Venom Swelling   Niacin Other (See Comments)    Muscle aches    Chief Complaint  Patient presents with   Discharge Note    Discharge    HPI:  He is being discharged with home health for pt/ot/rn. He will need the following dme: high back reclining wheelchair; mechanical lift; and hospital bed. He will need his prescriptions written and will need to follow up with his medical provider. He had been hospitalized for acute on chronic heart failure. He was admitted to this facility for short term rehab: bed mobility max assist; stand/pivot: dependent; upper body; lower body; brp: dependent.   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    Essential hypertension    Hematuria 04/30/2019   New complaint, does nott have pain of kidney stones, needs to submit UA for testing asap and needs urology eval   Hyperlipidemia    IGT (impaired glucose tolerance)    Obesity    OSA (obstructive sleep apnea)    Seizures    Age 17 or 3, no meds and no reoccurances   Skin lesion 12/07/2012   Skin tag 11/16/2011   Stroke    Umbilical hernia     Past Surgical History:  Procedure Laterality Date   BOWEL RESECTION N/A 10/22/2022   Procedure: SMALL BOWEL RESECTION;  Surgeon: Lewie Chamber, DO;  Location: [facility];  Service: General;  Laterality: N/A;   COLONOSCOPY N/A 07/15/2015   Procedure: COLONOSCOPY;  Surgeon: Franky Macho, MD;  Location: [facility];  Service: Gastroenterology;  Laterality: N/A;   CORONARY ARTERY BYPASS GRAFT  09/13/2002   x4   HERNIA REPAIR     INCISIONAL HERNIA REPAIR N/A 01/01/2013   Procedure: HERNIA REPAIR INCISIONAL;   Surgeon: Dalia Heading, MD;  Location: [facility];  Service: General;  Laterality: N/A;  umbilical area   INSERTION OF MESH N/A 01/01/2013   Procedure: INSERTION OF MESH;  Surgeon: Dalia Heading, MD;  Location: [facility];  Service: General;  Laterality: N/A;   LAPAROTOMY N/A 10/22/2022   Procedure: EXPLORATORY LAPAROTOMY;  Surgeon: Lewie Chamber, DO;  Location: [facility];  Service: General;  Laterality: N/A;   Left inguinal  hernia repair  09/14/2003   OMENTECTOMY N/A 10/22/2022   Procedure: OMENTECTOMY;  Surgeon: Lewie Chamber, DO;  Location: [facility];  Service: General;  Laterality: N/A;   [name]  09/13/2005   VENTRAL HERNIA REPAIR N/A 10/22/2022   Procedure: HERNIA REPAIR VENTRAL ADULT;  Surgeon: Lewie Chamber, DO;  Location: [facility];  Service: General;  Laterality: N/A;    Social History   Socioeconomic History   Marital status: Married    Spouse name: Karen Kitchens   Number of children: 2   Years of education: Not on file   Highest education level: 12th grade  Occupational History   Occupation: retired   Tobacco Use   Smoking status: Former    Packs/day: 0.50    Years: 5.00    Additional pack years: 0.00    Total pack years: 2.50    Types: Cigarettes  Quit date: 12/27/1980    Years since quitting: 42.0   Smokeless tobacco: Former    Types: Chew    Quit date: 01/24/2013  Vaping Use   Vaping Use: Never used  Substance and Sexual Activity   Alcohol use: No    Alcohol/week: 0.0 standard drinks of alcohol   Drug use: No   Sexual activity: Not Currently    Birth control/protection: None  Other Topics Concern   Not on file  Social History Narrative   Not on file   Social Determinants of Health   Financial Resource Strain: Low Risk  (06/28/2022)   Overall Financial Resource Strain (CARDIA)    Difficulty of Paying Living Expenses: Not hard at all  Food Insecurity: No Food Insecurity (11/23/2022)   Hunger Vital Sign    Worried About Running Out of  Food in the Last Year: Never true    Ran Out of Food in the Last Year: Never true  Transportation Needs: No Transportation Needs (11/23/2022)   PRAPARE - Administrator, Civil Service (Medical): No    Lack of Transportation (Non-Medical): No  Physical Activity: Inactive (06/28/2022)   Exercise Vital Sign    Days of Exercise per Week: 0 days    Minutes of Exercise per Session: 0 min  Stress: No Stress Concern Present (06/28/2022)   [employer] of Occupational Health - Occupational Stress Questionnaire    Feeling of Stress : Not at all  Social Connections: Moderately Integrated (06/28/2022)   Social Connection and Isolation Panel [NHANES]    Frequency of Communication with Friends and Family: More than three times a week    Frequency of Social Gatherings with Friends and Family: Three times a week    Attends Religious Services: More than 4 times per year    Active Member of Clubs or Organizations: No    Attends Banker Meetings: Never    Marital Status: Married  Catering manager Violence: Not At Risk (11/23/2022)   Humiliation, Afraid, Rape, and Kick questionnaire    Fear of Current or Ex-Partner: No    Emotionally Abused: No    Physically Abused: No    Sexually Abused: No   Family History  Problem Relation Age of Onset   Pancreatic cancer Mother    Heart disease Father    Prostate cancer Father    Stroke Brother    Heart disease Brother    Heart disease Brother    COPD Sister    Actinic keratosis Sister    Obesity Son    Anxiety disorder Son       VITAL SIGNS BP 112/62   Pulse 68   Temp 98 F (36.7 C)   Resp 20   Wt 171 lb 9.6 oz (77.8 kg)   SpO2 95%   BMI 26.88 kg/m   Outpatient Encounter Medications as of 12/22/2022  Medication Sig   acetaminophen (TYLENOL) 325 MG tablet Take 2 tablets (650 mg total) by mouth every 4 (four) hours as needed for fever, mild pain, moderate pain or headache.   Amino Acids-Protein Hydrolys (PRO-STAT AWC  PO) Take 30 mLs by mouth 3 (three) times daily.   amiodarone (PACERONE) 200 MG tablet Take 1 tablet (200 mg total) by mouth daily.   apixaban (ELIQUIS) 5 MG TABS tablet Take 1 tablet (5 mg total) by mouth 2 (two) times daily.   furosemide (LASIX) 40 MG tablet Take 40 mg by mouth 2 (two) times daily.   lidocaine 4 % Place  1 patch onto the skin daily. Apply to left hip every morning - remove at hs   losartan (COZAAR) 25 MG tablet Take 0.5 tablets (12.5 mg total) by mouth daily.   miconazole (MICOTIN) 2 % cream Apply 1 Application topically 2 (two) times daily. Apply to redness to groin/scrotum   mirtazapine (REMERON) 7.5 MG tablet Take 7.5 mg by mouth at bedtime.   polyethylene glycol (MIRALAX / GLYCOLAX) 17 g packet Take 17 g by mouth daily.   potassium chloride SA (KLOR-CON M) 20 MEQ tablet Take 20 mEq by mouth 2 (two) times daily.   spironolactone (ALDACTONE) 25 MG tablet Take 25 mg by mouth daily.   UNABLE TO FIND DYSPHAGIA 3 Diet / Thin liquids   No facility-administered encounter medications on file as of 12/22/2022.     SIGNIFICANT DIAGNOSTIC EXAMS  PREVIOUS   11-23-22: 2-d echo 1. Limited Echo for LVEF assessment   2. Left ventricular ejection fraction, by estimation, is 20 to 25%. The  left ventricle has severely decreased function. The left ventricle  demonstrates regional wall motion abnormalities (see scoring  diagram/findings for description). The left  ventricular internal cavity size was moderately dilated. There is mild  left ventricular hypertrophy.   3. The aortic valve is tricuspid. There is severe calcifcation of the  aortic valve. Aortic valve regurgitation is moderate to severe. Aortic  valve stenosis is at least moderate due to severe restriction in the  leaflet mobility.   4. The inferior vena cava is dilated in size with <50% respiratory  variability, suggesting right atrial pressure of 15 mmHg.   11-23-22: ct of chest:  Moderate right pleural effusion with adjacent  atelectasis and patchy airspace disease in the right upper lobe, which corresponds to the nodular opacity seen on recent chest radiograph. Additional patchy opacities in the left upper lobe and loculated effusion along the left oblique fissure. Findings are favored to represent multifocal pneumonia. Recommend follow-up chest CT in 3 months to ensure resolution. Small left basilar pleural effusion with adjacent thickened and calcified pleura consistent with a chronic process. Cardiomegaly with low-density of the cardiac blood pool, can be seen in the setting of anemia.       Mildly dilated main and branch pulmonary arteries, suggestive of pulmonary hypertension. Trace upper abdominal ascites. Mildly dilated ascending aorta measuring up to 4.1 cm  11-23-22 ct of abdomen and pelvis:  1. New calcification is seen bladder measuring 8 mm, likely a bladder calculus. 2. Unchanged subcapsular fluid collection of the left kidney, likely a hematoma secondary to a ruptured cyst.  3. Postsurgical changes of the anterior abdomen with new fluid collection, likely a postop seroma. 4. Aortic Atherosclerosis  NO NEW EXAMS   LABS REVIEWED;   11-22-22: wbc 9.4; hgb 9.8; hct 32.6; mcv 101.2 plt 216; glucose 108; bun 19; creat 0.69; k+ 2.9; na++ 140; ca 8.3 gfr >60; protein 6.0 albumin 2.8; BNP 2168.0 11-23-22; wbc 8.0; hgb 10.3; hct 34.0; mcv 101.2 plt 196; glucose 108; bun 19; creat 0.69; k+ 2.9; na++ 140; ca 8.3; gfr >60; vitamin B 12: 462; folate 24.3; phos 3.2 mag 1.8 11-29-22: glucose  103; bun 25; creat 0.88; k+ 3.3; na++ 140; ca 8.7; gfr >60  NO NEW LABS.   Review of Systems  Constitutional:  Negative for malaise/fatigue.  Respiratory:  Negative for cough and shortness of breath.   Cardiovascular:  Negative for chest pain, palpitations and leg swelling.  Gastrointestinal:  Negative for abdominal pain, constipation and heartburn.  Musculoskeletal:  Negative for back pain, joint pain and myalgias.  Skin:  Negative.   Neurological:  Negative for dizziness.  Psychiatric/Behavioral:  The patient is not nervous/anxious.    Physical Exam Constitutional:      General: He is not in acute distress.    Appearance: He is well-developed. He is not diaphoretic.  Neck:     Thyroid: No thyromegaly.  Cardiovascular:     Rate and Rhythm: Normal rate and regular rhythm.     Pulses: Normal pulses.     Heart sounds: Normal heart sounds.  Pulmonary:     Effort: Pulmonary effort is normal. No respiratory distress.     Breath sounds: Normal breath sounds.  Abdominal:     General: Bowel sounds are normal. There is no distension.     Palpations: Abdomen is soft.     Tenderness: There is no abdominal tenderness.  Musculoskeletal:     Cervical back: Neck supple.     Right lower leg: No edema.     Left lower leg: Edema present.     Comments:  Left hemiplegia; left upper extremity contracture present; with mild edema present    Lymphadenopathy:     Cervical: No cervical adenopathy.  Skin:    General: Skin is warm and dry.  Neurological:     Mental Status: He is alert. Mental status is at baseline.  Psychiatric:        Mood and Affect: Mood normal.      ASSESSMENT/ PLAN:   Patient is being discharged with the following home health services:  pt/ot/rn to evaluate and treat as indicated for gait balance strength adl training medication management   Patient is being discharged with the following durable medical equipment:  high back reclining wheelchair; hospital bed; mechanical lift.   Patient has been advised to f/u with their PCP in 1-2 weeks to for a transitions of care visit.  Social services at their facility was responsible for arranging this appointment.  Pt was provided with adequate prescriptions of noncontrolled medications to reach the scheduled appointment .  For controlled substances, a limited supply was provided as appropriate for the individual patient.  If the pt normally receives these  medications from a pain clinic or has a contract with another physician, these medications should be received from that clinic or physician only).    A 30 day supply of his prescription medications have been sent to [facility]  Time spent with patient: 40 minutes: dme; medications; home health.   Synthia Innocent NP [facility]  call [phone]

## 2022-12-28 DIAGNOSIS — G4733 Obstructive sleep apnea (adult) (pediatric): Secondary | ICD-10-CM | POA: Diagnosis not present

## 2022-12-28 DIAGNOSIS — E876 Hypokalemia: Secondary | ICD-10-CM | POA: Diagnosis not present

## 2022-12-28 DIAGNOSIS — Z7901 Long term (current) use of anticoagulants: Secondary | ICD-10-CM | POA: Diagnosis not present

## 2022-12-28 DIAGNOSIS — I7121 Aneurysm of the ascending aorta, without rupture: Secondary | ICD-10-CM | POA: Diagnosis not present

## 2022-12-28 DIAGNOSIS — I2510 Atherosclerotic heart disease of native coronary artery without angina pectoris: Secondary | ICD-10-CM | POA: Diagnosis not present

## 2022-12-28 DIAGNOSIS — G8929 Other chronic pain: Secondary | ICD-10-CM | POA: Diagnosis not present

## 2022-12-28 DIAGNOSIS — I69354 Hemiplegia and hemiparesis following cerebral infarction affecting left non-dominant side: Secondary | ICD-10-CM | POA: Diagnosis not present

## 2022-12-28 DIAGNOSIS — Z951 Presence of aortocoronary bypass graft: Secondary | ICD-10-CM | POA: Diagnosis not present

## 2022-12-28 DIAGNOSIS — I4891 Unspecified atrial fibrillation: Secondary | ICD-10-CM | POA: Diagnosis not present

## 2022-12-28 DIAGNOSIS — F411 Generalized anxiety disorder: Secondary | ICD-10-CM | POA: Diagnosis not present

## 2022-12-28 DIAGNOSIS — I110 Hypertensive heart disease with heart failure: Secondary | ICD-10-CM | POA: Diagnosis not present

## 2022-12-28 DIAGNOSIS — E6609 Other obesity due to excess calories: Secondary | ICD-10-CM | POA: Diagnosis not present

## 2022-12-28 DIAGNOSIS — E785 Hyperlipidemia, unspecified: Secondary | ICD-10-CM | POA: Diagnosis not present

## 2022-12-28 DIAGNOSIS — N200 Calculus of kidney: Secondary | ICD-10-CM | POA: Diagnosis not present

## 2022-12-28 DIAGNOSIS — R4189 Other symptoms and signs involving cognitive functions and awareness: Secondary | ICD-10-CM | POA: Diagnosis not present

## 2022-12-28 DIAGNOSIS — I5043 Acute on chronic combined systolic (congestive) and diastolic (congestive) heart failure: Secondary | ICD-10-CM | POA: Diagnosis not present

## 2022-12-28 DIAGNOSIS — J9601 Acute respiratory failure with hypoxia: Secondary | ICD-10-CM | POA: Diagnosis not present

## 2022-12-28 DIAGNOSIS — R29818 Other symptoms and signs involving the nervous system: Secondary | ICD-10-CM | POA: Diagnosis not present

## 2022-12-28 DIAGNOSIS — K5909 Other constipation: Secondary | ICD-10-CM | POA: Diagnosis not present

## 2022-12-28 DIAGNOSIS — M549 Dorsalgia, unspecified: Secondary | ICD-10-CM | POA: Diagnosis not present

## 2022-12-28 DIAGNOSIS — D649 Anemia, unspecified: Secondary | ICD-10-CM | POA: Diagnosis not present

## 2022-12-30 DIAGNOSIS — I69354 Hemiplegia and hemiparesis following cerebral infarction affecting left non-dominant side: Secondary | ICD-10-CM | POA: Diagnosis not present

## 2022-12-30 DIAGNOSIS — F411 Generalized anxiety disorder: Secondary | ICD-10-CM | POA: Diagnosis not present

## 2022-12-30 DIAGNOSIS — I5043 Acute on chronic combined systolic (congestive) and diastolic (congestive) heart failure: Secondary | ICD-10-CM | POA: Diagnosis not present

## 2022-12-30 DIAGNOSIS — I2510 Atherosclerotic heart disease of native coronary artery without angina pectoris: Secondary | ICD-10-CM | POA: Diagnosis not present

## 2022-12-30 DIAGNOSIS — I110 Hypertensive heart disease with heart failure: Secondary | ICD-10-CM | POA: Diagnosis not present

## 2022-12-30 DIAGNOSIS — J9601 Acute respiratory failure with hypoxia: Secondary | ICD-10-CM | POA: Diagnosis not present

## 2022-12-31 ENCOUNTER — Other Ambulatory Visit: Payer: Self-pay | Admitting: *Deleted

## 2022-12-31 DIAGNOSIS — I5043 Acute on chronic combined systolic (congestive) and diastolic (congestive) heart failure: Secondary | ICD-10-CM

## 2022-12-31 NOTE — Patient Outreach (Addendum)
[facility] Post- Acute Care Coordinator follow up. Screening for potential [facility] care coordination services as a benefit of health plan and PCP.   Per [facility] Mr. Joubert has [facility]. Mr. Mcinturff recently discharged from [facility].   Telephone call made to [facility] (spouse) at [phone]. The person answered phone stating writer called the wrong number. Telephone call made to Mr. Schroepfer home telephone number [phone]. No answer. HIPAA compliant voicemail message left to request return call.   Message sent to Melton Alar social worker to confirm exact discharge date. Writer unable to tell in [facility].   Will make referral to [facility] care coordination team.   Addendum: Lynnea Ferrier, SNF social worker reports Mr. Jago discharged from [facility] on 12/25/22.  Raiford Noble, MSN, RN,BSN [facility] Post Acute Care Coordinator [phone] (Direct dial)

## 2023-01-03 ENCOUNTER — Telehealth: Payer: Self-pay | Admitting: *Deleted

## 2023-01-03 NOTE — Progress Notes (Signed)
  Care Coordination   Note   01/03/2023 Name: Craig Fields MRN: [id number] DOB: 1941-04-20  Craig Fields is a 82 y.o. year old male who sees Lodema Hong, Milus Mallick, MD for primary care. I reached out to Craig Fields by phone today to offer care coordination services.  Craig Fields was given information about Care Coordination services today including:   The Care Coordination services include support from the care team which includes your Nurse Coordinator, Clinical Social Worker, or Pharmacist.  The Care Coordination team is here to help remove barriers to the health concerns and goals most important to you. Care Coordination services are voluntary, and the patient may decline or stop services at any time by request to their care team member.   Care Coordination Consent Status: Patient agreed to services and verbal consent obtained.   Follow up plan:  Telephone appointment with care coordination team member scheduled for:  01/06/23  Encounter Outcome:  Pt. Scheduled  [facility] Coordination Care Guide  Direct Dial: [phone]

## 2023-01-04 ENCOUNTER — Ambulatory Visit (INDEPENDENT_AMBULATORY_CARE_PROVIDER_SITE_OTHER): Payer: Medicare Other | Admitting: Internal Medicine

## 2023-01-04 ENCOUNTER — Encounter: Payer: Self-pay | Admitting: Internal Medicine

## 2023-01-04 VITALS — BP 98/63 | HR 67 | Ht 67.0 in

## 2023-01-04 DIAGNOSIS — R9389 Abnormal findings on diagnostic imaging of other specified body structures: Secondary | ICD-10-CM | POA: Insufficient documentation

## 2023-01-04 DIAGNOSIS — I5043 Acute on chronic combined systolic (congestive) and diastolic (congestive) heart failure: Secondary | ICD-10-CM

## 2023-01-04 MED ORDER — APIXABAN 5 MG PO TABS: 5.0000 mg | ORAL_TABLET | Freq: Two times a day (BID) | ORAL | 3 refills | Status: DC

## 2023-01-04 MED ORDER — SPIRONOLACTONE 25 MG PO TABS: 25.0000 mg | ORAL_TABLET | Freq: Every day | ORAL | 3 refills | Status: DC

## 2023-01-04 MED ORDER — LOSARTAN POTASSIUM 25 MG PO TABS: 12.5000 mg | ORAL_TABLET | Freq: Every day | ORAL | 3 refills | Status: DC

## 2023-01-04 MED ORDER — POTASSIUM CHLORIDE CRYS ER 20 MEQ PO TBCR: 20.0000 meq | EXTENDED_RELEASE_TABLET | Freq: Every day | ORAL | 3 refills | Status: DC

## 2023-01-04 MED ORDER — POTASSIUM CHLORIDE CRYS ER 20 MEQ PO TBCR: 20.0000 meq | EXTENDED_RELEASE_TABLET | Freq: Every day | ORAL | 0 refills | Status: DC

## 2023-01-04 MED ORDER — AMIODARONE HCL 200 MG PO TABS: 200.0000 mg | ORAL_TABLET | Freq: Every day | ORAL | 3 refills | Status: DC

## 2023-01-04 MED ORDER — FUROSEMIDE 40 MG PO TABS: 40.0000 mg | ORAL_TABLET | Freq: Every day | ORAL | 0 refills | Status: DC

## 2023-01-04 NOTE — Assessment & Plan Note (Signed)
Patient had CT of chest while in hospital for acute exacerbation of HFrEF.  It was favored patient had multifocal pneumonia but recommendation was to have follow-up CT in 3 months to ensure resolution.  CT chest without contrast ordered today to be completed in June for follow-up.

## 2023-01-04 NOTE — Patient Instructions (Addendum)
Thank you, [name] for allowing [place] to provide your care today. You look to be doing well. It was nice to meet you and your family.  Continue taking Lasix 40 mg once daily Continue taking potassium 20 meq daily   You have not had any changes to your medication since Dr. [name] checked your labs, so we will not repeat today.  I have ordered a CT chest to be done in approximately 2 months to follow-up opacities seen on previous CT chest.    Follow up in 3 months for routine care or sooner if needed.      Thurmon Fair, M.D.

## 2023-01-04 NOTE — Progress Notes (Signed)
HPI:Craig Fields is a 82 y.o. male who presents for evaluation of Hospitalization Follow-up ([facility] feels good) . For the details of today's visit, please refer to the assessment and plan.  Physical Exam: Vitals:   01/04/23 1318  BP: 98/63  Pulse: 67  SpO2: 96%  Height:  (1.702 m)     Physical Exam Constitutional:      General: He is not in acute distress.    Comments: Seen in wheelchair.  Alert.  Grandson, daughter, and wife accompanied patient to the visit.  HENT:     Mouth/Throat:     Mouth: Mucous membranes are moist.  Cardiovascular:     Rate and Rhythm: Normal rate and regular rhythm.     Heart sounds: Murmur heard.     Systolic murmur is present with a grade of 2/6.  Pulmonary:     Effort: No respiratory distress.     Breath sounds: Normal breath sounds. No wheezing or rales.  Musculoskeletal:     Right lower leg: No edema.     Left lower leg: No edema.  Neurological:     Mental Status: He is alert and oriented to person, place, and time.      Assessment & Plan:   Craig Fields was seen today for hospitalization follow-up.  Abnormal CT of the chest Assessment & Plan: Patient had CT of chest while in hospital for acute exacerbation of HFrEF.  It was favored patient had multifocal pneumonia but recommendation was to have follow-up CT in 3 months to ensure resolution.  CT chest without contrast ordered today to be completed in June for follow-up.     Orders: -     CT CHEST WO CONTRAST; Future  Acute on chronic combined systolic and diastolic congestive heart failure Overview: 3/11 - 11/29/2022 acute exacerbation of congestive heart failure.  BNP 2168.  Echo revealed EF of 20-25%, severe decreased left ventricular function, and moderate-severe aortic regurgitation.  Aortic stenosis was moderate and certainly stable.  Chest x-ray revealed pulmonary edema.  Excellent clinical response to aggressive IV diuresis.  Cardiology recommended holding  metoprolol.  Assessment & Plan:  Patient here from follow-up of acute exacerbation of congestive heart failure.  HFrEF with EF 20 -25% on TTE on 11/2022 with wma. He is followed up with his cardiologist, Dr. Nona Fields, since his discharge on 4/3.  Euvolemic and his weight was 174 pounds.  His GDMT was Cozaar 12.5 mg daily, Lasix 40 mg daily with KCl 20 mEq daily, and Aldactone was added.  He has been doing well since seeing Dr. Diona Fields. No increased lower extremity edema, or shortness of breath.  He is in a wheelchair  He was 174 pounds at cardiologist's office on 4/3.  Weight not completed today. Euvolemic on exam  Plan: Continue GDMT as above Lactone added a few weeks ago, will check BMP   Orders: -     BMP8+EGFR  Other orders -     Furosemide; Take 1 tablet (40 mg total) by mouth daily.  Dispense: 60 tablet; Refill: 0 -     Amiodarone HCl; Take 1 tablet (200 mg total) by mouth daily.  Dispense: 30 tablet; Refill: 3 -     Apixaban; Take 1 tablet (5 mg total) by mouth 2 (two) times daily.  Dispense: 60 tablet; Refill: 3 -     Losartan Potassium; Take 0.5 tablets (12.5 mg total) by mouth daily.  Dispense: 15 tablet; Refill: 3 -  Potassium Chloride Crys ER; Take 1 tablet (20 mEq total) by mouth daily.  Dispense: 60 tablet; Refill: 3 -     Spironolactone; Take 1 tablet (25 mg total) by mouth daily.  Dispense: 30 tablet; Refill: 3      Craig Banister, MD

## 2023-01-04 NOTE — Assessment & Plan Note (Addendum)
Patient here from follow-up of acute exacerbation of congestive heart failure.  HFrEF with EF 20 -25% on TTE on 11/2022 with wma. He is followed up with his cardiologist, Dr. [name], since his discharge on 4/3.  Euvolemic and his weight was 174 pounds.  His GDMT was Cozaar 12.5 mg daily, Lasix 40 mg daily with KCl 20 mEq daily, and Aldactone was added.  He has been doing well since seeing Dr. [name]. No increased lower extremity edema, or shortness of breath.  He is in a wheelchair  He was 174 pounds at cardiologist's office on 4/3.  Weight not completed today. Euvolemic on exam  Plan: Continue GDMT as above Lactone added a few weeks ago, will check BMP

## 2023-01-05 DIAGNOSIS — I2510 Atherosclerotic heart disease of native coronary artery without angina pectoris: Secondary | ICD-10-CM | POA: Diagnosis not present

## 2023-01-05 DIAGNOSIS — I69354 Hemiplegia and hemiparesis following cerebral infarction affecting left non-dominant side: Secondary | ICD-10-CM | POA: Diagnosis not present

## 2023-01-05 DIAGNOSIS — I110 Hypertensive heart disease with heart failure: Secondary | ICD-10-CM | POA: Diagnosis not present

## 2023-01-05 DIAGNOSIS — J9601 Acute respiratory failure with hypoxia: Secondary | ICD-10-CM | POA: Diagnosis not present

## 2023-01-05 DIAGNOSIS — I5043 Acute on chronic combined systolic (congestive) and diastolic (congestive) heart failure: Secondary | ICD-10-CM | POA: Diagnosis not present

## 2023-01-05 DIAGNOSIS — F411 Generalized anxiety disorder: Secondary | ICD-10-CM | POA: Diagnosis not present

## 2023-01-05 LAB — BMP8+EGFR
BUN/Creatinine Ratio: 34 — ABNORMAL HIGH (ref 10–24)
BUN: 30 mg/dL — ABNORMAL HIGH (ref 8–27)
CO2: 25 mmol/L (ref 20–29)
Calcium: 9.5 mg/dL (ref 8.6–10.2)
Chloride: 95 mmol/L — ABNORMAL LOW (ref 96–106)
Creatinine, Ser: 0.89 mg/dL (ref 0.76–1.27)
Glucose: 92 mg/dL (ref 70–99)
Potassium: 4.5 mmol/L (ref 3.5–5.2)
Sodium: 136 mmol/L (ref 134–144)
eGFR: 85 mL/min/{1.73_m2} (ref >59)

## 2023-01-06 ENCOUNTER — Ambulatory Visit: Payer: Self-pay | Admitting: *Deleted

## 2023-01-06 DIAGNOSIS — F411 Generalized anxiety disorder: Secondary | ICD-10-CM | POA: Diagnosis not present

## 2023-01-06 DIAGNOSIS — I69354 Hemiplegia and hemiparesis following cerebral infarction affecting left non-dominant side: Secondary | ICD-10-CM | POA: Diagnosis not present

## 2023-01-06 DIAGNOSIS — J9601 Acute respiratory failure with hypoxia: Secondary | ICD-10-CM | POA: Diagnosis not present

## 2023-01-06 DIAGNOSIS — I5043 Acute on chronic combined systolic (congestive) and diastolic (congestive) heart failure: Secondary | ICD-10-CM | POA: Diagnosis not present

## 2023-01-06 DIAGNOSIS — I2510 Atherosclerotic heart disease of native coronary artery without angina pectoris: Secondary | ICD-10-CM | POA: Diagnosis not present

## 2023-01-06 DIAGNOSIS — I110 Hypertensive heart disease with heart failure: Secondary | ICD-10-CM | POA: Diagnosis not present

## 2023-01-06 NOTE — Patient Outreach (Signed)
  Care Coordination   Initial Visit Note   11/29/2023 updated note for 01/06/23 Name: Craig Fields MRN: [id number] DOB: 1940-09-24  Craig Fields is a 82 y.o. year old male who sees Craig Perches, MD for primary care. I spoke with wife,   Craig Fields by phone today.  What matters to the patients health and wellness today?  Home health RN visited today, further home health staff to visit 01/06/23  congestive Heart Failure (CHF) not able to stand for weight now so encouraged to monitor for swelling Wife states fluid pill had been stopped related to hypotension which lead to congestive Heart Failure (CHF)    Urinal & diaper used for incontinences accidents   Had to conclude the call as Craig Fields needed to use the rest room      Goals Addressed             This Visit's Progress    obtain home health visits [facility])       Interventions Today    Flowsheet Row Most Recent Value  Chronic Disease   Chronic disease during today's visit Congestive Heart Failure (CHF)  General Interventions   General Interventions Discussed/Reviewed General Interventions Discussed, Doctor Visits, Community Resources  Doctor Visits Discussed/Reviewed Doctor Visits Discussed, PCP  PCP/Specialist Visits Compliance with follow-up visit  Exercise Interventions   Exercise Discussed/Reviewed Exercise Discussed, Physical Activity  Physical Activity Discussed/Reviewed Physical Activity Discussed, Home Exercise Program (HEP)  Education Interventions   Education Provided Provided Education  Provided Verbal Education On Medication, Community Resources, When to see the doctor  [home health services duration in homes]  Mental Health Interventions   Mental Health Discussed/Reviewed Mental Health Discussed, Coping Strategies  Nutrition Interventions   Nutrition Discussed/Reviewed Nutrition Discussed, Fluid intake  Pharmacy Interventions   Pharmacy Dicussed/Reviewed Pharmacy Topics Discussed, Affording  Medications  Safety Interventions   Safety Discussed/Reviewed Safety Discussed, Home Safety  Home Safety Assistive Devices              SDOH assessments and interventions completed:  No     Care Coordination Interventions:  Yes, provided   Follow up plan: No further intervention required. Per wife    Encounter Outcome:  Pt. Visit Completed   Craig Fields L. Noelle Penner, RN, BSN, CCM [facility] Care Management Community Coordinator Office number [phone]

## 2023-01-07 ENCOUNTER — Telehealth: Payer: Self-pay | Admitting: Family Medicine

## 2023-01-07 DIAGNOSIS — I5043 Acute on chronic combined systolic (congestive) and diastolic (congestive) heart failure: Secondary | ICD-10-CM | POA: Diagnosis not present

## 2023-01-07 DIAGNOSIS — F411 Generalized anxiety disorder: Secondary | ICD-10-CM | POA: Diagnosis not present

## 2023-01-07 DIAGNOSIS — I2510 Atherosclerotic heart disease of native coronary artery without angina pectoris: Secondary | ICD-10-CM | POA: Diagnosis not present

## 2023-01-07 DIAGNOSIS — J9601 Acute respiratory failure with hypoxia: Secondary | ICD-10-CM | POA: Diagnosis not present

## 2023-01-07 DIAGNOSIS — I69354 Hemiplegia and hemiparesis following cerebral infarction affecting left non-dominant side: Secondary | ICD-10-CM | POA: Diagnosis not present

## 2023-01-07 DIAGNOSIS — I110 Hypertensive heart disease with heart failure: Secondary | ICD-10-CM | POA: Diagnosis not present

## 2023-01-07 NOTE — Telephone Encounter (Signed)
Zacharia w. Occupational therapist with [employer] called ion on patient behalf   OT frequency 2 week 2  1 week 5  Adl Therapeutic exercises  Function / transfers  Adaptive equipment   Call back  [phone]

## 2023-01-07 NOTE — Telephone Encounter (Signed)
Verbal order given  

## 2023-01-10 DIAGNOSIS — I2510 Atherosclerotic heart disease of native coronary artery without angina pectoris: Secondary | ICD-10-CM | POA: Diagnosis not present

## 2023-01-10 DIAGNOSIS — I69354 Hemiplegia and hemiparesis following cerebral infarction affecting left non-dominant side: Secondary | ICD-10-CM | POA: Diagnosis not present

## 2023-01-10 DIAGNOSIS — F411 Generalized anxiety disorder: Secondary | ICD-10-CM | POA: Diagnosis not present

## 2023-01-10 DIAGNOSIS — I110 Hypertensive heart disease with heart failure: Secondary | ICD-10-CM | POA: Diagnosis not present

## 2023-01-10 DIAGNOSIS — J9601 Acute respiratory failure with hypoxia: Secondary | ICD-10-CM | POA: Diagnosis not present

## 2023-01-10 DIAGNOSIS — I5043 Acute on chronic combined systolic (congestive) and diastolic (congestive) heart failure: Secondary | ICD-10-CM | POA: Diagnosis not present

## 2023-01-13 DIAGNOSIS — I5043 Acute on chronic combined systolic (congestive) and diastolic (congestive) heart failure: Secondary | ICD-10-CM | POA: Diagnosis not present

## 2023-01-13 DIAGNOSIS — I110 Hypertensive heart disease with heart failure: Secondary | ICD-10-CM | POA: Diagnosis not present

## 2023-01-13 DIAGNOSIS — F411 Generalized anxiety disorder: Secondary | ICD-10-CM | POA: Diagnosis not present

## 2023-01-13 DIAGNOSIS — I2510 Atherosclerotic heart disease of native coronary artery without angina pectoris: Secondary | ICD-10-CM | POA: Diagnosis not present

## 2023-01-13 DIAGNOSIS — J9601 Acute respiratory failure with hypoxia: Secondary | ICD-10-CM | POA: Diagnosis not present

## 2023-01-13 DIAGNOSIS — I69354 Hemiplegia and hemiparesis following cerebral infarction affecting left non-dominant side: Secondary | ICD-10-CM | POA: Diagnosis not present

## 2023-01-14 DIAGNOSIS — I69354 Hemiplegia and hemiparesis following cerebral infarction affecting left non-dominant side: Secondary | ICD-10-CM | POA: Diagnosis not present

## 2023-01-14 DIAGNOSIS — J9601 Acute respiratory failure with hypoxia: Secondary | ICD-10-CM | POA: Diagnosis not present

## 2023-01-14 DIAGNOSIS — I2510 Atherosclerotic heart disease of native coronary artery without angina pectoris: Secondary | ICD-10-CM | POA: Diagnosis not present

## 2023-01-14 DIAGNOSIS — I5043 Acute on chronic combined systolic (congestive) and diastolic (congestive) heart failure: Secondary | ICD-10-CM | POA: Diagnosis not present

## 2023-01-14 DIAGNOSIS — F411 Generalized anxiety disorder: Secondary | ICD-10-CM | POA: Diagnosis not present

## 2023-01-14 DIAGNOSIS — I110 Hypertensive heart disease with heart failure: Secondary | ICD-10-CM | POA: Diagnosis not present

## 2023-01-17 ENCOUNTER — Telehealth: Payer: Self-pay | Admitting: Family Medicine

## 2023-01-17 DIAGNOSIS — I110 Hypertensive heart disease with heart failure: Secondary | ICD-10-CM | POA: Diagnosis not present

## 2023-01-17 DIAGNOSIS — I69354 Hemiplegia and hemiparesis following cerebral infarction affecting left non-dominant side: Secondary | ICD-10-CM | POA: Diagnosis not present

## 2023-01-17 DIAGNOSIS — I5043 Acute on chronic combined systolic (congestive) and diastolic (congestive) heart failure: Secondary | ICD-10-CM | POA: Diagnosis not present

## 2023-01-17 DIAGNOSIS — F411 Generalized anxiety disorder: Secondary | ICD-10-CM | POA: Diagnosis not present

## 2023-01-17 DIAGNOSIS — J9601 Acute respiratory failure with hypoxia: Secondary | ICD-10-CM | POA: Diagnosis not present

## 2023-01-17 DIAGNOSIS — I2510 Atherosclerotic heart disease of native coronary artery without angina pectoris: Secondary | ICD-10-CM | POA: Diagnosis not present

## 2023-01-17 NOTE — Telephone Encounter (Signed)
Patient spouse called needs medication refill  mirtazapine (REMERON) 7.5 MG tablet [[id number]]   Patient need other medication as well said has spoke to [place] and she suppose get the list together. Call back # [phone]  [facility]

## 2023-01-18 MED ORDER — TRAZODONE HCL 50 MG PO TABS: ORAL_TABLET | ORAL | 3 refills | Status: DC

## 2023-01-18 NOTE — Telephone Encounter (Signed)
I sent in trazodone half tablet at night for sleep instead of Remeron due to safer medication for him because of heart, so no to remeron, new is trazodone and this is prescribed

## 2023-01-18 NOTE — Telephone Encounter (Signed)
Pls see medication sent in place of remeron, the note is below

## 2023-01-19 ENCOUNTER — Encounter: Payer: Self-pay | Admitting: Student

## 2023-01-19 ENCOUNTER — Ambulatory Visit: Payer: Medicare Other | Attending: Student | Admitting: Student

## 2023-01-19 VITALS — BP 104/56 | HR 64 | Ht 69.5 in

## 2023-01-19 DIAGNOSIS — I110 Hypertensive heart disease with heart failure: Secondary | ICD-10-CM | POA: Diagnosis not present

## 2023-01-19 DIAGNOSIS — I350 Nonrheumatic aortic (valve) stenosis: Secondary | ICD-10-CM | POA: Insufficient documentation

## 2023-01-19 DIAGNOSIS — I10 Essential (primary) hypertension: Secondary | ICD-10-CM | POA: Insufficient documentation

## 2023-01-19 DIAGNOSIS — I2510 Atherosclerotic heart disease of native coronary artery without angina pectoris: Secondary | ICD-10-CM | POA: Insufficient documentation

## 2023-01-19 DIAGNOSIS — I5043 Acute on chronic combined systolic (congestive) and diastolic (congestive) heart failure: Secondary | ICD-10-CM | POA: Diagnosis not present

## 2023-01-19 DIAGNOSIS — F411 Generalized anxiety disorder: Secondary | ICD-10-CM | POA: Diagnosis not present

## 2023-01-19 DIAGNOSIS — J9601 Acute respiratory failure with hypoxia: Secondary | ICD-10-CM | POA: Diagnosis not present

## 2023-01-19 DIAGNOSIS — I4819 Other persistent atrial fibrillation: Secondary | ICD-10-CM | POA: Diagnosis not present

## 2023-01-19 DIAGNOSIS — I5020 Unspecified systolic (congestive) heart failure: Secondary | ICD-10-CM | POA: Diagnosis not present

## 2023-01-19 DIAGNOSIS — E785 Hyperlipidemia, unspecified: Secondary | ICD-10-CM | POA: Insufficient documentation

## 2023-01-19 DIAGNOSIS — I69354 Hemiplegia and hemiparesis following cerebral infarction affecting left non-dominant side: Secondary | ICD-10-CM | POA: Diagnosis not present

## 2023-01-19 MED ORDER — SIMVASTATIN 20 MG PO TABS: 20.0000 mg | ORAL_TABLET | Freq: Every day | ORAL | 3 refills | Status: DC

## 2023-01-19 NOTE — Progress Notes (Signed)
Cardiology Office Note    Date:  01/19/2023  ID:  Craig Fields, DOB [birth year], MRN [id number] Cardiologist: [name], MD    History of Present Illness:    Craig Fields is a 82 y.o. male with past medical history of CAD (s/p CABG in 2004), HFrEF (EF 20-25% in 10/2022), persistent atrial fibrillation, aortic stenosis, HTN, HLD, OSA and prior CVA who presents to the office today for 44-month follow-up.   He was last examined by Dr. [name] in 12/2022 following a recent hospitalization for an acute CHF exacerbation and recent echocardiogram had shown a reduced EF of 20 to 25% and low-flow, low gradient severe AS with mean gradient of 27 mmHg. He was currently residing at the [facility] and denied any recent anginal symptoms at that time. He was overall not felt to be a good candidate for TAVR given his current functional capacity. In regards to medical therapy for his cardiomyopathy, he was continued on Losartan 12.5 mg daily with Lasix being reduced to 40 mg daily and Aldactone being started at 12.5 mg daily. He was not started on an SGLT2 inhibitor given his infection risk. Was recommended to consider adding a beta-blocker at the time of his next visit pending his heart rate.  In talking with the patient, his daughter and grandson today, he has returned home from SNF but is working with PT and OT several times per week. 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  He is in a wheelchair most of the day. Reports his breathing has been stable and denies any specific orthopnea, PND or pitting edema. No recent exertional chest pain or palpitations. His BP is soft at 104/56 today but he denies any associated dizziness or presyncope.   Studies Reviewed:   EKG: EKG is not ordered today.  Echocardiogram: 10/2022 IMPRESSIONS     1. Left ventricular ejection fraction, by estimation, is 20 to 25%. The  left ventricle has severely  decreased function. The left ventricle  demonstrates regional wall motion abnormalities (see scoring  diagram/findings for description). Left ventricular  diastolic parameters are consistent with Grade II diastolic dysfunction  (pseudonormalization).   2. Right ventricular systolic function is mildly reduced. The right  ventricular size is normal. There is mildly elevated pulmonary artery  systolic pressure. The estimated right ventricular systolic pressure is  39.0 mmHg.   3. Left atrial size was severely dilated.   4. Right atrial size was mildly dilated.   5. The mitral valve is degenerative. Mild mitral valve regurgitation. No  evidence of mitral stenosis.   6. The aortic valve is abnormal. There is severe calcifcation of the  aortic valve. Aortic valve regurgitation is moderate. Moderate aortic  valve stenosis. Aortic valve area, by VTI measures 0.63 cm. Aortic valve  mean gradient measures 27.0 mmHg. Aortic   valve Vmax measures 3.40 m/s. DI 0.20, SVI 24 ml/m2, possible low flow  low gradient AS.   7. The inferior vena cava is dilated in size with <50% respiratory  variability, suggesting right atrial pressure of 15 mmHg.   Limited Echocardiogram: 11/2022 IMPRESSIONS     1. Limited Echo for LVEF assessment   2. Left ventricular ejection fraction, by estimation, is 20 to 25%. The  left ventricle has severely decreased function. The left ventricle  demonstrates regional wall motion abnormalities (see scoring  diagram/findings for description). The left  ventricular internal cavity size was moderately  dilated. There is mild  left ventricular hypertrophy.   3. The aortic valve is tricuspid. There is severe calcifcation of the  aortic valve. Aortic valve regurgitation is moderate to severe. Aortic  valve stenosis is at least moderate due to severe restriction in the  leaflet mobility.   4. The inferior vena cava is dilated in size with <50% respiratory  variability, suggesting  right atrial pressure of 15 mmHg.   Comparison(s): No significant change from prior study. Visually aortic  regurgitation jet worsened compared to 10/2022 Echo.    Physical Exam:   VS:  BP (!) 104/56   Pulse 64   Ht 5' 9.5" (1.765 m)   SpO2 96%   BMI 24.98 kg/m    Wt Readings from Last 3 Encounters:  12/22/22 171 lb 9.6 oz (77.8 kg)  12/15/22 174 lb (78.9 kg)  12/15/22 174 lb (78.9 kg)     GEN: Elderly male appearing in no acute distress. Sitting in wheelchair.  NECK: No JVD; No carotid bruits CARDIAC: RRR, 2/6 SEM along RUSB.  RESPIRATORY:  Clear to auscultation without rales, wheezing or rhonchi  ABDOMEN: Appears non-distended. No obvious abdominal masses. EXTREMITIES: No clubbing or cyanosis. No pitting edema.  Distal pedal pulses are 2+ bilaterally.   Assessment and Plan:   1. HFrEF - Recent echocardiogram showed his EF was reduced at 20-25%. Medical management has been pursued at this time given his multiple medical issues. Thankfully, he appears euvolemic on examination today and his respiratory status has improved over the past few months. He is currently on Lasix 40 mg daily, Losartan 12.5 mg daily and Spironolactone 25 mg daily. Recent labs showed his kidney function was stable with creatinine at 0.89.   - Given his BP at 104/56, I am unable to further titrate medical therapy today. I did provide him with a BP log and encouraged them to return this in 2 to 3 weeks. If BP and HR allow, would like to add Toprol-XL 12.5 mg daily or Bisoprolol 2.5 mg daily. Doubt BP would allow for switching from Losartan to [facility] and would not add an SGLT2 inhibitor currently given his high-risk for UTI's. Can ultimately obtain a follow-up echocardiogram in several months once GDMT has been optimized.  2. CAD - He is s/p CABG in 2004. While his activity is limited, he denies any recent anginal symptoms. - He is not on ASA given the need for anticoagulation and will plan to restart statin  therapy as discussed below.  3. Persistent Atrial Fibrillation - He denies any recent palpitations and is maintaining normal sinus rhythm by examination today. Continue Amiodarone 200 mg daily. - No reports of active bleeding. He remains on Eliquis 5 mg twice daily for anticoagulation which is the appropriate dose at this time given his age, weight and renal function.  4. Aortic Stenosis - Most recent imaging was concerning for low-flow/low gradient severe AS and at this time, he is not a candidate for TAVR as discussed during prior office visits. Will continue to follow along as he is still pursuing PT and will hopefully continue to improve. It is also not clear at this time if he would wish to undergo a cardiac catheterization and TAVR in the future as he reports "I am already 82 years old".  5. HTN - His BP is soft at 104/56 during today's visit. At this time, continue current medical therapy with Lasix 40 mg daily, Losartan 12.5 mg daily and Spironolactone 25 mg daily.  6.  HLD - He was previously on Simvastatin 20 mg daily but it appears this was discontinued at SNF for unclear reasons. His LDL was at 61 in 06/2022. Given his history of CAD with CABG and prior CVA, would recommend resuming Simvastatin 20 mg daily as he overall tolerated this well throughout the years. While he is on Amiodarone 200 mg daily, we can use Simvastatin at 20 mg daily but would not further titrate the dose.   Signed, Ellsworth Lennox, PA-C

## 2023-01-19 NOTE — Patient Instructions (Addendum)
Medication Instructions:  Re-start Simvastatin 20 mg daily at  bedtime  Labwork: None today  Testing/Procedures: None today  Follow-Up: 3 months  Any Other Special Instructions Will Be Listed Below (If Applicable).   Keep daily blood pressure log daily for the next 2-3 weeks, return to office for review.  If you need a refill on your cardiac medications before your next appointment, please call your pharmacy.

## 2023-01-20 DIAGNOSIS — J9601 Acute respiratory failure with hypoxia: Secondary | ICD-10-CM

## 2023-01-20 DIAGNOSIS — I69354 Hemiplegia and hemiparesis following cerebral infarction affecting left non-dominant side: Secondary | ICD-10-CM

## 2023-01-20 DIAGNOSIS — M549 Dorsalgia, unspecified: Secondary | ICD-10-CM

## 2023-01-20 DIAGNOSIS — I4891 Unspecified atrial fibrillation: Secondary | ICD-10-CM

## 2023-01-20 DIAGNOSIS — F411 Generalized anxiety disorder: Secondary | ICD-10-CM

## 2023-01-20 DIAGNOSIS — N200 Calculus of kidney: Secondary | ICD-10-CM

## 2023-01-20 DIAGNOSIS — E876 Hypokalemia: Secondary | ICD-10-CM

## 2023-01-20 DIAGNOSIS — I110 Hypertensive heart disease with heart failure: Secondary | ICD-10-CM

## 2023-01-20 DIAGNOSIS — I2510 Atherosclerotic heart disease of native coronary artery without angina pectoris: Secondary | ICD-10-CM

## 2023-01-20 DIAGNOSIS — G8929 Other chronic pain: Secondary | ICD-10-CM

## 2023-01-20 DIAGNOSIS — I7121 Aneurysm of the ascending aorta, without rupture: Secondary | ICD-10-CM

## 2023-01-20 DIAGNOSIS — I5043 Acute on chronic combined systolic (congestive) and diastolic (congestive) heart failure: Secondary | ICD-10-CM

## 2023-01-20 NOTE — Telephone Encounter (Signed)
Patient aware.

## 2023-01-25 DIAGNOSIS — J9601 Acute respiratory failure with hypoxia: Secondary | ICD-10-CM | POA: Diagnosis not present

## 2023-01-25 DIAGNOSIS — I2510 Atherosclerotic heart disease of native coronary artery without angina pectoris: Secondary | ICD-10-CM | POA: Diagnosis not present

## 2023-01-25 DIAGNOSIS — I5043 Acute on chronic combined systolic (congestive) and diastolic (congestive) heart failure: Secondary | ICD-10-CM | POA: Diagnosis not present

## 2023-01-25 DIAGNOSIS — F411 Generalized anxiety disorder: Secondary | ICD-10-CM | POA: Diagnosis not present

## 2023-01-25 DIAGNOSIS — I69354 Hemiplegia and hemiparesis following cerebral infarction affecting left non-dominant side: Secondary | ICD-10-CM | POA: Diagnosis not present

## 2023-01-25 DIAGNOSIS — I110 Hypertensive heart disease with heart failure: Secondary | ICD-10-CM | POA: Diagnosis not present

## 2023-01-26 DIAGNOSIS — I110 Hypertensive heart disease with heart failure: Secondary | ICD-10-CM | POA: Diagnosis not present

## 2023-01-26 DIAGNOSIS — I5043 Acute on chronic combined systolic (congestive) and diastolic (congestive) heart failure: Secondary | ICD-10-CM | POA: Diagnosis not present

## 2023-01-26 DIAGNOSIS — I2510 Atherosclerotic heart disease of native coronary artery without angina pectoris: Secondary | ICD-10-CM | POA: Diagnosis not present

## 2023-01-26 DIAGNOSIS — F411 Generalized anxiety disorder: Secondary | ICD-10-CM | POA: Diagnosis not present

## 2023-01-26 DIAGNOSIS — J9601 Acute respiratory failure with hypoxia: Secondary | ICD-10-CM | POA: Diagnosis not present

## 2023-01-26 DIAGNOSIS — I69354 Hemiplegia and hemiparesis following cerebral infarction affecting left non-dominant side: Secondary | ICD-10-CM | POA: Diagnosis not present

## 2023-01-27 DIAGNOSIS — F411 Generalized anxiety disorder: Secondary | ICD-10-CM | POA: Diagnosis not present

## 2023-01-27 DIAGNOSIS — Z951 Presence of aortocoronary bypass graft: Secondary | ICD-10-CM | POA: Diagnosis not present

## 2023-01-27 DIAGNOSIS — J9601 Acute respiratory failure with hypoxia: Secondary | ICD-10-CM | POA: Diagnosis not present

## 2023-01-27 DIAGNOSIS — R29818 Other symptoms and signs involving the nervous system: Secondary | ICD-10-CM | POA: Diagnosis not present

## 2023-01-27 DIAGNOSIS — I4891 Unspecified atrial fibrillation: Secondary | ICD-10-CM | POA: Diagnosis not present

## 2023-01-27 DIAGNOSIS — I110 Hypertensive heart disease with heart failure: Secondary | ICD-10-CM | POA: Diagnosis not present

## 2023-01-27 DIAGNOSIS — I69354 Hemiplegia and hemiparesis following cerebral infarction affecting left non-dominant side: Secondary | ICD-10-CM | POA: Diagnosis not present

## 2023-01-27 DIAGNOSIS — M549 Dorsalgia, unspecified: Secondary | ICD-10-CM | POA: Diagnosis not present

## 2023-01-27 DIAGNOSIS — E6609 Other obesity due to excess calories: Secondary | ICD-10-CM | POA: Diagnosis not present

## 2023-01-27 DIAGNOSIS — K5909 Other constipation: Secondary | ICD-10-CM | POA: Diagnosis not present

## 2023-01-27 DIAGNOSIS — I7121 Aneurysm of the ascending aorta, without rupture: Secondary | ICD-10-CM | POA: Diagnosis not present

## 2023-01-27 DIAGNOSIS — E876 Hypokalemia: Secondary | ICD-10-CM | POA: Diagnosis not present

## 2023-01-27 DIAGNOSIS — G4733 Obstructive sleep apnea (adult) (pediatric): Secondary | ICD-10-CM | POA: Diagnosis not present

## 2023-01-27 DIAGNOSIS — I2510 Atherosclerotic heart disease of native coronary artery without angina pectoris: Secondary | ICD-10-CM | POA: Diagnosis not present

## 2023-01-27 DIAGNOSIS — R4189 Other symptoms and signs involving cognitive functions and awareness: Secondary | ICD-10-CM | POA: Diagnosis not present

## 2023-01-27 DIAGNOSIS — I5043 Acute on chronic combined systolic (congestive) and diastolic (congestive) heart failure: Secondary | ICD-10-CM | POA: Diagnosis not present

## 2023-01-27 DIAGNOSIS — E785 Hyperlipidemia, unspecified: Secondary | ICD-10-CM | POA: Diagnosis not present

## 2023-01-27 DIAGNOSIS — N200 Calculus of kidney: Secondary | ICD-10-CM | POA: Diagnosis not present

## 2023-01-27 DIAGNOSIS — G8929 Other chronic pain: Secondary | ICD-10-CM | POA: Diagnosis not present

## 2023-01-27 DIAGNOSIS — D649 Anemia, unspecified: Secondary | ICD-10-CM | POA: Diagnosis not present

## 2023-01-27 DIAGNOSIS — Z7901 Long term (current) use of anticoagulants: Secondary | ICD-10-CM | POA: Diagnosis not present

## 2023-01-28 DIAGNOSIS — I5043 Acute on chronic combined systolic (congestive) and diastolic (congestive) heart failure: Secondary | ICD-10-CM | POA: Diagnosis not present

## 2023-01-28 DIAGNOSIS — I2510 Atherosclerotic heart disease of native coronary artery without angina pectoris: Secondary | ICD-10-CM | POA: Diagnosis not present

## 2023-01-28 DIAGNOSIS — I69354 Hemiplegia and hemiparesis following cerebral infarction affecting left non-dominant side: Secondary | ICD-10-CM | POA: Diagnosis not present

## 2023-01-28 DIAGNOSIS — I110 Hypertensive heart disease with heart failure: Secondary | ICD-10-CM | POA: Diagnosis not present

## 2023-01-28 DIAGNOSIS — J9601 Acute respiratory failure with hypoxia: Secondary | ICD-10-CM | POA: Diagnosis not present

## 2023-01-28 DIAGNOSIS — F411 Generalized anxiety disorder: Secondary | ICD-10-CM | POA: Diagnosis not present

## 2023-02-01 ENCOUNTER — Telehealth: Payer: Self-pay | Admitting: Family Medicine

## 2023-02-01 ENCOUNTER — Ambulatory Visit (HOSPITAL_COMMUNITY): Payer: Medicare Other

## 2023-02-01 DIAGNOSIS — I69354 Hemiplegia and hemiparesis following cerebral infarction affecting left non-dominant side: Secondary | ICD-10-CM | POA: Diagnosis not present

## 2023-02-01 DIAGNOSIS — J9601 Acute respiratory failure with hypoxia: Secondary | ICD-10-CM | POA: Diagnosis not present

## 2023-02-01 DIAGNOSIS — F411 Generalized anxiety disorder: Secondary | ICD-10-CM | POA: Diagnosis not present

## 2023-02-01 DIAGNOSIS — I110 Hypertensive heart disease with heart failure: Secondary | ICD-10-CM | POA: Diagnosis not present

## 2023-02-01 DIAGNOSIS — I2510 Atherosclerotic heart disease of native coronary artery without angina pectoris: Secondary | ICD-10-CM | POA: Diagnosis not present

## 2023-02-01 DIAGNOSIS — I5043 Acute on chronic combined systolic (congestive) and diastolic (congestive) heart failure: Secondary | ICD-10-CM | POA: Diagnosis not present

## 2023-02-01 NOTE — Telephone Encounter (Signed)
Sonya called from [facility] home health left voicemail during lunch hours, needs establish order for patient. Sonya called back # [phone]

## 2023-02-01 NOTE — Telephone Encounter (Signed)
Lamar Laundry is aware we received plan of care

## 2023-02-02 DIAGNOSIS — I5043 Acute on chronic combined systolic (congestive) and diastolic (congestive) heart failure: Secondary | ICD-10-CM | POA: Diagnosis not present

## 2023-02-02 DIAGNOSIS — F411 Generalized anxiety disorder: Secondary | ICD-10-CM | POA: Diagnosis not present

## 2023-02-02 DIAGNOSIS — I110 Hypertensive heart disease with heart failure: Secondary | ICD-10-CM | POA: Diagnosis not present

## 2023-02-02 DIAGNOSIS — I2510 Atherosclerotic heart disease of native coronary artery without angina pectoris: Secondary | ICD-10-CM | POA: Diagnosis not present

## 2023-02-02 DIAGNOSIS — J9601 Acute respiratory failure with hypoxia: Secondary | ICD-10-CM | POA: Diagnosis not present

## 2023-02-02 DIAGNOSIS — I69354 Hemiplegia and hemiparesis following cerebral infarction affecting left non-dominant side: Secondary | ICD-10-CM | POA: Diagnosis not present

## 2023-02-03 DIAGNOSIS — I69354 Hemiplegia and hemiparesis following cerebral infarction affecting left non-dominant side: Secondary | ICD-10-CM | POA: Diagnosis not present

## 2023-02-03 DIAGNOSIS — J9601 Acute respiratory failure with hypoxia: Secondary | ICD-10-CM | POA: Diagnosis not present

## 2023-02-03 DIAGNOSIS — I5043 Acute on chronic combined systolic (congestive) and diastolic (congestive) heart failure: Secondary | ICD-10-CM | POA: Diagnosis not present

## 2023-02-03 DIAGNOSIS — I2510 Atherosclerotic heart disease of native coronary artery without angina pectoris: Secondary | ICD-10-CM | POA: Diagnosis not present

## 2023-02-03 DIAGNOSIS — F411 Generalized anxiety disorder: Secondary | ICD-10-CM | POA: Diagnosis not present

## 2023-02-03 DIAGNOSIS — I110 Hypertensive heart disease with heart failure: Secondary | ICD-10-CM | POA: Diagnosis not present

## 2023-02-09 NOTE — Telephone Encounter (Signed)
Lamar Laundry called back said has not received by fax yet. Fax # [phone].  her call back phone [phone]

## 2023-02-11 DIAGNOSIS — I5043 Acute on chronic combined systolic (congestive) and diastolic (congestive) heart failure: Secondary | ICD-10-CM | POA: Diagnosis not present

## 2023-02-11 DIAGNOSIS — J9601 Acute respiratory failure with hypoxia: Secondary | ICD-10-CM | POA: Diagnosis not present

## 2023-02-11 DIAGNOSIS — I69354 Hemiplegia and hemiparesis following cerebral infarction affecting left non-dominant side: Secondary | ICD-10-CM | POA: Diagnosis not present

## 2023-02-11 DIAGNOSIS — F411 Generalized anxiety disorder: Secondary | ICD-10-CM | POA: Diagnosis not present

## 2023-02-11 DIAGNOSIS — I2510 Atherosclerotic heart disease of native coronary artery without angina pectoris: Secondary | ICD-10-CM | POA: Diagnosis not present

## 2023-02-11 DIAGNOSIS — I110 Hypertensive heart disease with heart failure: Secondary | ICD-10-CM | POA: Diagnosis not present

## 2023-02-13 ENCOUNTER — Other Ambulatory Visit: Payer: Self-pay | Admitting: Family Medicine

## 2023-02-14 NOTE — Telephone Encounter (Signed)
Form Is here awaiting Dr Anthony Sar signature as she is out of the office this week

## 2023-02-15 ENCOUNTER — Other Ambulatory Visit: Payer: Self-pay | Admitting: Adult Health

## 2023-02-15 DIAGNOSIS — F411 Generalized anxiety disorder: Secondary | ICD-10-CM | POA: Diagnosis not present

## 2023-02-15 DIAGNOSIS — J9601 Acute respiratory failure with hypoxia: Secondary | ICD-10-CM | POA: Diagnosis not present

## 2023-02-15 DIAGNOSIS — I2510 Atherosclerotic heart disease of native coronary artery without angina pectoris: Secondary | ICD-10-CM | POA: Diagnosis not present

## 2023-02-15 DIAGNOSIS — I5043 Acute on chronic combined systolic (congestive) and diastolic (congestive) heart failure: Secondary | ICD-10-CM | POA: Diagnosis not present

## 2023-02-15 DIAGNOSIS — I110 Hypertensive heart disease with heart failure: Secondary | ICD-10-CM | POA: Diagnosis not present

## 2023-02-15 DIAGNOSIS — I69354 Hemiplegia and hemiparesis following cerebral infarction affecting left non-dominant side: Secondary | ICD-10-CM | POA: Diagnosis not present

## 2023-02-16 ENCOUNTER — Other Ambulatory Visit: Payer: Self-pay | Admitting: Adult Health

## 2023-02-17 ENCOUNTER — Telehealth: Payer: Self-pay | Admitting: Family Medicine

## 2023-02-17 ENCOUNTER — Other Ambulatory Visit: Payer: Self-pay

## 2023-02-17 NOTE — Telephone Encounter (Signed)
Pt called and requested Trazodone to be taken off med list please!     traZODone (DESYREL) 50 MG tablet [[id number]]

## 2023-02-17 NOTE — Telephone Encounter (Signed)
Removed from patient med list

## 2023-02-18 DIAGNOSIS — F411 Generalized anxiety disorder: Secondary | ICD-10-CM | POA: Diagnosis not present

## 2023-02-18 DIAGNOSIS — I2510 Atherosclerotic heart disease of native coronary artery without angina pectoris: Secondary | ICD-10-CM | POA: Diagnosis not present

## 2023-02-18 DIAGNOSIS — I110 Hypertensive heart disease with heart failure: Secondary | ICD-10-CM | POA: Diagnosis not present

## 2023-02-18 DIAGNOSIS — I69354 Hemiplegia and hemiparesis following cerebral infarction affecting left non-dominant side: Secondary | ICD-10-CM | POA: Diagnosis not present

## 2023-02-18 DIAGNOSIS — J9601 Acute respiratory failure with hypoxia: Secondary | ICD-10-CM | POA: Diagnosis not present

## 2023-02-18 DIAGNOSIS — I5043 Acute on chronic combined systolic (congestive) and diastolic (congestive) heart failure: Secondary | ICD-10-CM | POA: Diagnosis not present

## 2023-02-19 ENCOUNTER — Other Ambulatory Visit: Payer: Self-pay | Admitting: Adult Health

## 2023-02-21 ENCOUNTER — Other Ambulatory Visit: Payer: Self-pay

## 2023-02-21 ENCOUNTER — Telehealth: Payer: Self-pay | Admitting: Family Medicine

## 2023-02-21 MED ORDER — FUROSEMIDE 40 MG PO TABS: 40.0000 mg | ORAL_TABLET | Freq: Every day | ORAL | 0 refills | Status: DC

## 2023-02-21 NOTE — Telephone Encounter (Signed)
Prescription Request  02/21/2023  LOV: 07/02/2022  What is the name of the medication or equipment? furosemide (LASIX) 40 MG tablet   Have you contacted your pharmacy to request a refill? Yes   Which pharmacy would you like this sent to?   [id number] [place], [place] - [address] AT [facility] [address] [place] [address] Phone: [phone] Fax: [phone]     Patient notified that their request is being sent to the clinical staff for review and that they should receive a response within 2 business days.   Please advise at [facility] [phone]

## 2023-02-21 NOTE — Telephone Encounter (Signed)
Refills sent to pharmacy. 

## 2023-02-22 ENCOUNTER — Other Ambulatory Visit: Payer: Self-pay

## 2023-02-22 MED ORDER — FUROSEMIDE 40 MG PO TABS: 40.0000 mg | ORAL_TABLET | Freq: Every day | ORAL | 0 refills | Status: DC

## 2023-02-22 NOTE — Telephone Encounter (Signed)
Refill sent.

## 2023-02-22 NOTE — Telephone Encounter (Signed)
Pharm has not received refill request for furosemide (LASIX) 40 MG tablet [   [id number] [place], [place] - [address] AT [facility] [address] [place] [address] Phone: [phone] Fax: [phone]

## 2023-02-23 ENCOUNTER — Other Ambulatory Visit: Payer: Self-pay

## 2023-02-23 DIAGNOSIS — I5043 Acute on chronic combined systolic (congestive) and diastolic (congestive) heart failure: Secondary | ICD-10-CM | POA: Diagnosis not present

## 2023-02-23 DIAGNOSIS — I69354 Hemiplegia and hemiparesis following cerebral infarction affecting left non-dominant side: Secondary | ICD-10-CM | POA: Diagnosis not present

## 2023-02-23 DIAGNOSIS — F411 Generalized anxiety disorder: Secondary | ICD-10-CM | POA: Diagnosis not present

## 2023-02-23 DIAGNOSIS — I110 Hypertensive heart disease with heart failure: Secondary | ICD-10-CM | POA: Diagnosis not present

## 2023-02-23 DIAGNOSIS — I2510 Atherosclerotic heart disease of native coronary artery without angina pectoris: Secondary | ICD-10-CM | POA: Diagnosis not present

## 2023-02-23 DIAGNOSIS — J9601 Acute respiratory failure with hypoxia: Secondary | ICD-10-CM | POA: Diagnosis not present

## 2023-02-23 MED ORDER — FUROSEMIDE 40 MG PO TABS: 40.0000 mg | ORAL_TABLET | Freq: Every day | ORAL | 0 refills | Status: DC

## 2023-02-23 NOTE — Telephone Encounter (Signed)
Rx manually faxed 

## 2023-02-23 NOTE — Telephone Encounter (Signed)
Spouse called said pharmacy not received yet.

## 2023-02-24 ENCOUNTER — Telehealth: Payer: Self-pay | Admitting: Family Medicine

## 2023-02-24 DIAGNOSIS — I69354 Hemiplegia and hemiparesis following cerebral infarction affecting left non-dominant side: Secondary | ICD-10-CM | POA: Diagnosis not present

## 2023-02-24 DIAGNOSIS — F411 Generalized anxiety disorder: Secondary | ICD-10-CM | POA: Diagnosis not present

## 2023-02-24 DIAGNOSIS — J9601 Acute respiratory failure with hypoxia: Secondary | ICD-10-CM | POA: Diagnosis not present

## 2023-02-24 DIAGNOSIS — I110 Hypertensive heart disease with heart failure: Secondary | ICD-10-CM | POA: Diagnosis not present

## 2023-02-24 DIAGNOSIS — I5043 Acute on chronic combined systolic (congestive) and diastolic (congestive) heart failure: Secondary | ICD-10-CM | POA: Diagnosis not present

## 2023-02-24 DIAGNOSIS — I2510 Atherosclerotic heart disease of native coronary artery without angina pectoris: Secondary | ICD-10-CM | POA: Diagnosis not present

## 2023-02-24 NOTE — Telephone Encounter (Signed)
[place] Occupational Therapist w. [employer] called in on patient behalf.  Verbal orders home health OT   1 week 7   ADL Exercise  Activities  Transfers   Call back number [phone]

## 2023-02-24 NOTE — Telephone Encounter (Signed)
Verbal order given  

## 2023-02-26 DIAGNOSIS — I110 Hypertensive heart disease with heart failure: Secondary | ICD-10-CM | POA: Diagnosis not present

## 2023-02-26 DIAGNOSIS — I7121 Aneurysm of the ascending aorta, without rupture: Secondary | ICD-10-CM | POA: Diagnosis not present

## 2023-02-26 DIAGNOSIS — I2510 Atherosclerotic heart disease of native coronary artery without angina pectoris: Secondary | ICD-10-CM | POA: Diagnosis not present

## 2023-02-26 DIAGNOSIS — M549 Dorsalgia, unspecified: Secondary | ICD-10-CM | POA: Diagnosis not present

## 2023-02-26 DIAGNOSIS — J9601 Acute respiratory failure with hypoxia: Secondary | ICD-10-CM | POA: Diagnosis not present

## 2023-02-26 DIAGNOSIS — E876 Hypokalemia: Secondary | ICD-10-CM | POA: Diagnosis not present

## 2023-02-26 DIAGNOSIS — R4189 Other symptoms and signs involving cognitive functions and awareness: Secondary | ICD-10-CM | POA: Diagnosis not present

## 2023-02-26 DIAGNOSIS — F411 Generalized anxiety disorder: Secondary | ICD-10-CM | POA: Diagnosis not present

## 2023-02-26 DIAGNOSIS — R29818 Other symptoms and signs involving the nervous system: Secondary | ICD-10-CM | POA: Diagnosis not present

## 2023-02-26 DIAGNOSIS — G8929 Other chronic pain: Secondary | ICD-10-CM | POA: Diagnosis not present

## 2023-02-26 DIAGNOSIS — I5043 Acute on chronic combined systolic (congestive) and diastolic (congestive) heart failure: Secondary | ICD-10-CM | POA: Diagnosis not present

## 2023-02-26 DIAGNOSIS — N200 Calculus of kidney: Secondary | ICD-10-CM | POA: Diagnosis not present

## 2023-02-26 DIAGNOSIS — D649 Anemia, unspecified: Secondary | ICD-10-CM | POA: Diagnosis not present

## 2023-02-26 DIAGNOSIS — E6609 Other obesity due to excess calories: Secondary | ICD-10-CM | POA: Diagnosis not present

## 2023-02-26 DIAGNOSIS — G4733 Obstructive sleep apnea (adult) (pediatric): Secondary | ICD-10-CM | POA: Diagnosis not present

## 2023-02-26 DIAGNOSIS — Z7901 Long term (current) use of anticoagulants: Secondary | ICD-10-CM | POA: Diagnosis not present

## 2023-02-26 DIAGNOSIS — Z951 Presence of aortocoronary bypass graft: Secondary | ICD-10-CM | POA: Diagnosis not present

## 2023-02-26 DIAGNOSIS — E785 Hyperlipidemia, unspecified: Secondary | ICD-10-CM | POA: Diagnosis not present

## 2023-02-26 DIAGNOSIS — I4891 Unspecified atrial fibrillation: Secondary | ICD-10-CM | POA: Diagnosis not present

## 2023-02-26 DIAGNOSIS — K5909 Other constipation: Secondary | ICD-10-CM | POA: Diagnosis not present

## 2023-02-26 DIAGNOSIS — I69354 Hemiplegia and hemiparesis following cerebral infarction affecting left non-dominant side: Secondary | ICD-10-CM | POA: Diagnosis not present

## 2023-03-04 DIAGNOSIS — F411 Generalized anxiety disorder: Secondary | ICD-10-CM | POA: Diagnosis not present

## 2023-03-04 DIAGNOSIS — J9601 Acute respiratory failure with hypoxia: Secondary | ICD-10-CM | POA: Diagnosis not present

## 2023-03-04 DIAGNOSIS — I110 Hypertensive heart disease with heart failure: Secondary | ICD-10-CM | POA: Diagnosis not present

## 2023-03-04 DIAGNOSIS — I2510 Atherosclerotic heart disease of native coronary artery without angina pectoris: Secondary | ICD-10-CM | POA: Diagnosis not present

## 2023-03-04 DIAGNOSIS — I69354 Hemiplegia and hemiparesis following cerebral infarction affecting left non-dominant side: Secondary | ICD-10-CM | POA: Diagnosis not present

## 2023-03-04 DIAGNOSIS — I5043 Acute on chronic combined systolic (congestive) and diastolic (congestive) heart failure: Secondary | ICD-10-CM | POA: Diagnosis not present

## 2023-03-11 DIAGNOSIS — I69354 Hemiplegia and hemiparesis following cerebral infarction affecting left non-dominant side: Secondary | ICD-10-CM | POA: Diagnosis not present

## 2023-03-11 DIAGNOSIS — J9601 Acute respiratory failure with hypoxia: Secondary | ICD-10-CM | POA: Diagnosis not present

## 2023-03-11 DIAGNOSIS — I2510 Atherosclerotic heart disease of native coronary artery without angina pectoris: Secondary | ICD-10-CM | POA: Diagnosis not present

## 2023-03-11 DIAGNOSIS — F411 Generalized anxiety disorder: Secondary | ICD-10-CM | POA: Diagnosis not present

## 2023-03-11 DIAGNOSIS — I5043 Acute on chronic combined systolic (congestive) and diastolic (congestive) heart failure: Secondary | ICD-10-CM | POA: Diagnosis not present

## 2023-03-11 DIAGNOSIS — I110 Hypertensive heart disease with heart failure: Secondary | ICD-10-CM | POA: Diagnosis not present

## 2023-03-14 DIAGNOSIS — J9601 Acute respiratory failure with hypoxia: Secondary | ICD-10-CM | POA: Diagnosis not present

## 2023-03-14 DIAGNOSIS — I5043 Acute on chronic combined systolic (congestive) and diastolic (congestive) heart failure: Secondary | ICD-10-CM | POA: Diagnosis not present

## 2023-03-14 DIAGNOSIS — I69354 Hemiplegia and hemiparesis following cerebral infarction affecting left non-dominant side: Secondary | ICD-10-CM | POA: Diagnosis not present

## 2023-03-14 DIAGNOSIS — I2510 Atherosclerotic heart disease of native coronary artery without angina pectoris: Secondary | ICD-10-CM | POA: Diagnosis not present

## 2023-03-14 DIAGNOSIS — I110 Hypertensive heart disease with heart failure: Secondary | ICD-10-CM | POA: Diagnosis not present

## 2023-03-14 DIAGNOSIS — F411 Generalized anxiety disorder: Secondary | ICD-10-CM | POA: Diagnosis not present

## 2023-03-17 DIAGNOSIS — J9601 Acute respiratory failure with hypoxia: Secondary | ICD-10-CM | POA: Diagnosis not present

## 2023-03-17 DIAGNOSIS — I69354 Hemiplegia and hemiparesis following cerebral infarction affecting left non-dominant side: Secondary | ICD-10-CM | POA: Diagnosis not present

## 2023-03-17 DIAGNOSIS — F411 Generalized anxiety disorder: Secondary | ICD-10-CM | POA: Diagnosis not present

## 2023-03-17 DIAGNOSIS — I110 Hypertensive heart disease with heart failure: Secondary | ICD-10-CM | POA: Diagnosis not present

## 2023-03-17 DIAGNOSIS — I5043 Acute on chronic combined systolic (congestive) and diastolic (congestive) heart failure: Secondary | ICD-10-CM | POA: Diagnosis not present

## 2023-03-17 DIAGNOSIS — I2510 Atherosclerotic heart disease of native coronary artery without angina pectoris: Secondary | ICD-10-CM | POA: Diagnosis not present

## 2023-03-20 ENCOUNTER — Other Ambulatory Visit: Payer: Self-pay | Admitting: Family Medicine

## 2023-03-23 DIAGNOSIS — I5043 Acute on chronic combined systolic (congestive) and diastolic (congestive) heart failure: Secondary | ICD-10-CM | POA: Diagnosis not present

## 2023-03-23 DIAGNOSIS — I69354 Hemiplegia and hemiparesis following cerebral infarction affecting left non-dominant side: Secondary | ICD-10-CM | POA: Diagnosis not present

## 2023-03-23 DIAGNOSIS — I2510 Atherosclerotic heart disease of native coronary artery without angina pectoris: Secondary | ICD-10-CM | POA: Diagnosis not present

## 2023-03-23 DIAGNOSIS — F411 Generalized anxiety disorder: Secondary | ICD-10-CM | POA: Diagnosis not present

## 2023-03-23 DIAGNOSIS — J9601 Acute respiratory failure with hypoxia: Secondary | ICD-10-CM | POA: Diagnosis not present

## 2023-03-23 DIAGNOSIS — I110 Hypertensive heart disease with heart failure: Secondary | ICD-10-CM | POA: Diagnosis not present

## 2023-03-25 DIAGNOSIS — J9601 Acute respiratory failure with hypoxia: Secondary | ICD-10-CM | POA: Diagnosis not present

## 2023-03-25 DIAGNOSIS — I2510 Atherosclerotic heart disease of native coronary artery without angina pectoris: Secondary | ICD-10-CM | POA: Diagnosis not present

## 2023-03-25 DIAGNOSIS — I5043 Acute on chronic combined systolic (congestive) and diastolic (congestive) heart failure: Secondary | ICD-10-CM | POA: Diagnosis not present

## 2023-03-25 DIAGNOSIS — I110 Hypertensive heart disease with heart failure: Secondary | ICD-10-CM | POA: Diagnosis not present

## 2023-03-25 DIAGNOSIS — F411 Generalized anxiety disorder: Secondary | ICD-10-CM | POA: Diagnosis not present

## 2023-03-25 DIAGNOSIS — I69354 Hemiplegia and hemiparesis following cerebral infarction affecting left non-dominant side: Secondary | ICD-10-CM | POA: Diagnosis not present

## 2023-03-28 DIAGNOSIS — K5909 Other constipation: Secondary | ICD-10-CM | POA: Diagnosis not present

## 2023-03-28 DIAGNOSIS — E6609 Other obesity due to excess calories: Secondary | ICD-10-CM | POA: Diagnosis not present

## 2023-03-28 DIAGNOSIS — E876 Hypokalemia: Secondary | ICD-10-CM | POA: Diagnosis not present

## 2023-03-28 DIAGNOSIS — G8929 Other chronic pain: Secondary | ICD-10-CM | POA: Diagnosis not present

## 2023-03-28 DIAGNOSIS — I69354 Hemiplegia and hemiparesis following cerebral infarction affecting left non-dominant side: Secondary | ICD-10-CM | POA: Diagnosis not present

## 2023-03-28 DIAGNOSIS — I5043 Acute on chronic combined systolic (congestive) and diastolic (congestive) heart failure: Secondary | ICD-10-CM | POA: Diagnosis not present

## 2023-03-28 DIAGNOSIS — R4189 Other symptoms and signs involving cognitive functions and awareness: Secondary | ICD-10-CM | POA: Diagnosis not present

## 2023-03-28 DIAGNOSIS — Z7901 Long term (current) use of anticoagulants: Secondary | ICD-10-CM | POA: Diagnosis not present

## 2023-03-28 DIAGNOSIS — I4891 Unspecified atrial fibrillation: Secondary | ICD-10-CM | POA: Diagnosis not present

## 2023-03-28 DIAGNOSIS — Z951 Presence of aortocoronary bypass graft: Secondary | ICD-10-CM | POA: Diagnosis not present

## 2023-03-28 DIAGNOSIS — R29818 Other symptoms and signs involving the nervous system: Secondary | ICD-10-CM | POA: Diagnosis not present

## 2023-03-28 DIAGNOSIS — F411 Generalized anxiety disorder: Secondary | ICD-10-CM | POA: Diagnosis not present

## 2023-03-28 DIAGNOSIS — J9601 Acute respiratory failure with hypoxia: Secondary | ICD-10-CM | POA: Diagnosis not present

## 2023-03-28 DIAGNOSIS — I110 Hypertensive heart disease with heart failure: Secondary | ICD-10-CM | POA: Diagnosis not present

## 2023-03-28 DIAGNOSIS — D649 Anemia, unspecified: Secondary | ICD-10-CM | POA: Diagnosis not present

## 2023-03-28 DIAGNOSIS — G4733 Obstructive sleep apnea (adult) (pediatric): Secondary | ICD-10-CM | POA: Diagnosis not present

## 2023-03-28 DIAGNOSIS — M549 Dorsalgia, unspecified: Secondary | ICD-10-CM | POA: Diagnosis not present

## 2023-03-28 DIAGNOSIS — N200 Calculus of kidney: Secondary | ICD-10-CM | POA: Diagnosis not present

## 2023-03-28 DIAGNOSIS — I7121 Aneurysm of the ascending aorta, without rupture: Secondary | ICD-10-CM | POA: Diagnosis not present

## 2023-03-28 DIAGNOSIS — E785 Hyperlipidemia, unspecified: Secondary | ICD-10-CM | POA: Diagnosis not present

## 2023-03-28 DIAGNOSIS — I2510 Atherosclerotic heart disease of native coronary artery without angina pectoris: Secondary | ICD-10-CM | POA: Diagnosis not present

## 2023-03-30 DIAGNOSIS — I110 Hypertensive heart disease with heart failure: Secondary | ICD-10-CM | POA: Diagnosis not present

## 2023-03-30 DIAGNOSIS — I69354 Hemiplegia and hemiparesis following cerebral infarction affecting left non-dominant side: Secondary | ICD-10-CM | POA: Diagnosis not present

## 2023-03-30 DIAGNOSIS — I5043 Acute on chronic combined systolic (congestive) and diastolic (congestive) heart failure: Secondary | ICD-10-CM | POA: Diagnosis not present

## 2023-03-30 DIAGNOSIS — I2510 Atherosclerotic heart disease of native coronary artery without angina pectoris: Secondary | ICD-10-CM | POA: Diagnosis not present

## 2023-03-30 DIAGNOSIS — F411 Generalized anxiety disorder: Secondary | ICD-10-CM | POA: Diagnosis not present

## 2023-03-30 DIAGNOSIS — J9601 Acute respiratory failure with hypoxia: Secondary | ICD-10-CM | POA: Diagnosis not present

## 2023-04-04 DIAGNOSIS — I69354 Hemiplegia and hemiparesis following cerebral infarction affecting left non-dominant side: Secondary | ICD-10-CM | POA: Diagnosis not present

## 2023-04-04 DIAGNOSIS — I110 Hypertensive heart disease with heart failure: Secondary | ICD-10-CM | POA: Diagnosis not present

## 2023-04-04 DIAGNOSIS — I2510 Atherosclerotic heart disease of native coronary artery without angina pectoris: Secondary | ICD-10-CM | POA: Diagnosis not present

## 2023-04-04 DIAGNOSIS — F411 Generalized anxiety disorder: Secondary | ICD-10-CM | POA: Diagnosis not present

## 2023-04-04 DIAGNOSIS — I5043 Acute on chronic combined systolic (congestive) and diastolic (congestive) heart failure: Secondary | ICD-10-CM | POA: Diagnosis not present

## 2023-04-04 DIAGNOSIS — J9601 Acute respiratory failure with hypoxia: Secondary | ICD-10-CM | POA: Diagnosis not present

## 2023-04-05 ENCOUNTER — Ambulatory Visit (INDEPENDENT_AMBULATORY_CARE_PROVIDER_SITE_OTHER): Payer: Medicare Other | Admitting: Family Medicine

## 2023-04-05 ENCOUNTER — Encounter: Payer: Self-pay | Admitting: Family Medicine

## 2023-04-05 VITALS — BP 109/67 | HR 63 | Ht 69.0 in

## 2023-04-05 DIAGNOSIS — I2510 Atherosclerotic heart disease of native coronary artery without angina pectoris: Secondary | ICD-10-CM | POA: Diagnosis not present

## 2023-04-05 DIAGNOSIS — I10 Essential (primary) hypertension: Secondary | ICD-10-CM

## 2023-04-05 DIAGNOSIS — I5042 Chronic combined systolic (congestive) and diastolic (congestive) heart failure: Secondary | ICD-10-CM | POA: Diagnosis not present

## 2023-04-05 DIAGNOSIS — I69954 Hemiplegia and hemiparesis following unspecified cerebrovascular disease affecting left non-dominant side: Secondary | ICD-10-CM

## 2023-04-05 DIAGNOSIS — E559 Vitamin D deficiency, unspecified: Secondary | ICD-10-CM

## 2023-04-05 DIAGNOSIS — D649 Anemia, unspecified: Secondary | ICD-10-CM

## 2023-04-05 DIAGNOSIS — R7989 Other specified abnormal findings of blood chemistry: Secondary | ICD-10-CM | POA: Diagnosis not present

## 2023-04-05 DIAGNOSIS — I359 Nonrheumatic aortic valve disorder, unspecified: Secondary | ICD-10-CM | POA: Diagnosis not present

## 2023-04-05 DIAGNOSIS — I480 Paroxysmal atrial fibrillation: Secondary | ICD-10-CM | POA: Diagnosis not present

## 2023-04-05 DIAGNOSIS — R7303 Prediabetes: Secondary | ICD-10-CM

## 2023-04-05 DIAGNOSIS — E785 Hyperlipidemia, unspecified: Secondary | ICD-10-CM

## 2023-04-05 NOTE — Patient Instructions (Signed)
F/U in 4 months, call if you need me sooner  No changes in medication Labs today CBC, lipid, cmp and EGFr, TSH and vit D  Thankful you are improving  Please stay on current medications  Thanks for choosing [facility], we consider it a privelige to serve you.

## 2023-04-06 DIAGNOSIS — I5043 Acute on chronic combined systolic (congestive) and diastolic (congestive) heart failure: Secondary | ICD-10-CM | POA: Diagnosis not present

## 2023-04-06 DIAGNOSIS — F411 Generalized anxiety disorder: Secondary | ICD-10-CM | POA: Diagnosis not present

## 2023-04-06 DIAGNOSIS — I2510 Atherosclerotic heart disease of native coronary artery without angina pectoris: Secondary | ICD-10-CM | POA: Diagnosis not present

## 2023-04-06 DIAGNOSIS — I110 Hypertensive heart disease with heart failure: Secondary | ICD-10-CM | POA: Diagnosis not present

## 2023-04-06 DIAGNOSIS — J9601 Acute respiratory failure with hypoxia: Secondary | ICD-10-CM | POA: Diagnosis not present

## 2023-04-06 DIAGNOSIS — I69354 Hemiplegia and hemiparesis following cerebral infarction affecting left non-dominant side: Secondary | ICD-10-CM | POA: Diagnosis not present

## 2023-04-06 LAB — CMP14+EGFR
ALT: 14 IU/L (ref 0–44)
AST: 25 IU/L (ref 0–40)
Albumin: 3.9 g/dL (ref 3.7–4.7)
Alkaline Phosphatase: 60 IU/L (ref 44–121)
BUN/Creatinine Ratio: 25 — ABNORMAL HIGH (ref 10–24)
BUN: 29 mg/dL — ABNORMAL HIGH (ref 8–27)
Bilirubin Total: 0.6 mg/dL (ref 0.0–1.2)
CO2: 23 mmol/L (ref 20–29)
Calcium: 9.3 mg/dL (ref 8.6–10.2)
Chloride: 100 mmol/L (ref 96–106)
Creatinine, Ser: 1.17 mg/dL (ref 0.76–1.27)
Globulin, Total: 2.6 g/dL (ref 1.5–4.5)
Glucose: 89 mg/dL (ref 70–99)
Potassium: 4.4 mmol/L (ref 3.5–5.2)
Sodium: 138 mmol/L (ref 134–144)
Total Protein: 6.5 g/dL (ref 6.0–8.5)
eGFR: 62 mL/min/{1.73_m2} (ref >59)

## 2023-04-06 LAB — CBC
Hematocrit: 36.7 % — ABNORMAL LOW (ref 37.5–51.0)
Hemoglobin: 11 g/dL — ABNORMAL LOW (ref 13.0–17.7)
MCH: 27.5 pg (ref 26.6–33.0)
MCHC: 30 g/dL — ABNORMAL LOW (ref 31.5–35.7)
MCV: 92 fL (ref 79–97)
Platelets: 167 10*3/uL (ref 150–450)
RBC: 4 x10E6/uL — ABNORMAL LOW (ref 4.14–5.80)
RDW: 14.9 % (ref 11.6–15.4)
WBC: 6.5 10*3/uL (ref 3.4–10.8)

## 2023-04-06 LAB — VITAMIN D 25 HYDROXY (VIT D DEFICIENCY, FRACTURES): Vit D, 25-Hydroxy: 51.8 ng/mL (ref 30.0–100.0)

## 2023-04-06 LAB — LIPID PANEL
Chol/HDL Ratio: 2 ratio (ref 0.0–5.0)
Cholesterol, Total: 126 mg/dL (ref 100–199)
HDL: 62 mg/dL (ref >39)
LDL Chol Calc (NIH): 52 mg/dL (ref 0–99)
Triglycerides: 54 mg/dL (ref 0–149)
VLDL Cholesterol Cal: 12 mg/dL (ref 5–40)

## 2023-04-06 LAB — TSH: TSH: 5.03 u[IU]/mL — ABNORMAL HIGH (ref 0.450–4.500)

## 2023-04-07 ENCOUNTER — Other Ambulatory Visit: Payer: Self-pay

## 2023-04-07 DIAGNOSIS — D649 Anemia, unspecified: Secondary | ICD-10-CM

## 2023-04-11 ENCOUNTER — Encounter: Payer: Self-pay | Admitting: Family Medicine

## 2023-04-11 DIAGNOSIS — I359 Nonrheumatic aortic valve disorder, unspecified: Secondary | ICD-10-CM | POA: Insufficient documentation

## 2023-04-11 DIAGNOSIS — I480 Paroxysmal atrial fibrillation: Secondary | ICD-10-CM | POA: Insufficient documentation

## 2023-04-11 NOTE — Assessment & Plan Note (Signed)
Maintained on eliquis 

## 2023-04-11 NOTE — Progress Notes (Signed)
   Craig Fields     MRN: [id number]      DOB: 14-Nov-1940  Chief Complaint  Patient presents with   Follow-up    Follow up    HPI Craig Fields is here for follow up and re-evaluation of chronic medical conditions, medication management and review of any available recent lab and radiology data.  Preventive health is updated, specifically  Cancer screening and Immunization.   Questions or concerns regarding consultations or procedures which the PT has had in the interim are  addressed. The PT denies any adverse reactions to current medications since the last visit.  There are no new concerns.  There are no specific complaints   ROS Denies recent fever or chills. Denies sinus pressure, nasal congestion, ear pain or sore throat. Denies chest congestion, productive cough or wheezing. Denies chest pains, palpitations and leg swelling Denies abdominal pain, nausea, vomiting,diarrhea or constipation.   Denies dysuria, frequency, hesitancy e. Chronic joint pain, swelling and limitation in mobility. Denies headaches, seizures, numbness, or tingling. Denies depression, anxiety or insomnia. Denies skin break down or rash.   PE  BP 109/67 (BP Location: Right Arm, Patient Position: Sitting, Cuff Size: Large)   Pulse 63   Ht 5\' 9"  (1.753 m)   SpO2 97%   BMI 25.34 kg/m   Patient alert and oriented and in no cardiopulmonary distress.  HEENT: No facial asymmetry, EOMI,     Neck decreased ROM  Chest: Clear to auscultation bilaterally.  CVS: S1, S2 systolic murmur, no S3.Regular rate.  ABD: Soft non tender.   Ext: No edema  MS: markedly decreased  ROM spine, shoulders, hips and knees.  Skin: Intact, no ulcerations or rash noted.  Psych: Good eye contact, normal affect. t not anxious or depressed appearing.  CNS: CN 2-12 intact, left hemiplegia   Assessment & Plan  Essential hypertension, benign Controlled, no change in medication   Hemiplegia, late effect of cerebrovascular  disease (HCC) Unchanged exam, left hemiplegia  Chronic combined systolic and diastolic CHF (congestive heart failure) (HCC) Stable, no decompensation   Hyperlipidemia LDL goal <70 Hyperlipidemia:Low fat diet discussed and encouraged. At goal , no med change   Lipid Panel  Lab Results  Component Value Date   CHOL 126 04/05/2023   HDL 62 04/05/2023   LDLCALC 52 04/05/2023   TRIG 54 04/05/2023   CHOLHDL 2.0 04/05/2023       Paroxysmal atrial fibrillation (HCC) Maintained on eliquis  Aortic valve disease Echo in 2024 demonstrates both severe stenosis and moderate regurgitationfollowed closely by crdiology  Coronary atherosclerosis of native coronary artery Multiivessel CAD

## 2023-04-11 NOTE — Assessment & Plan Note (Signed)
Stable, no decompensation

## 2023-04-11 NOTE — Assessment & Plan Note (Addendum)
Echo in 2024 demonstrates both severe stenosis and moderate regurgitationfollowed closely by crdiology

## 2023-04-11 NOTE — Assessment & Plan Note (Addendum)
Hyperlipidemia:Low fat diet discussed and encouraged. At goal , no med change   Lipid Panel  Lab Results  Component Value Date   CHOL 126 04/05/2023   HDL 62 04/05/2023   LDLCALC 52 04/05/2023   TRIG 54 04/05/2023   CHOLHDL 2.0 04/05/2023

## 2023-04-11 NOTE — Assessment & Plan Note (Signed)
Unchanged exam, left hemiplegia

## 2023-04-11 NOTE — Assessment & Plan Note (Signed)
Controlled, no change in medication  

## 2023-04-11 NOTE — Assessment & Plan Note (Signed)
Multiivessel CAD

## 2023-04-15 DIAGNOSIS — F411 Generalized anxiety disorder: Secondary | ICD-10-CM | POA: Diagnosis not present

## 2023-04-15 DIAGNOSIS — I2510 Atherosclerotic heart disease of native coronary artery without angina pectoris: Secondary | ICD-10-CM | POA: Diagnosis not present

## 2023-04-15 DIAGNOSIS — I110 Hypertensive heart disease with heart failure: Secondary | ICD-10-CM | POA: Diagnosis not present

## 2023-04-15 DIAGNOSIS — I69354 Hemiplegia and hemiparesis following cerebral infarction affecting left non-dominant side: Secondary | ICD-10-CM | POA: Diagnosis not present

## 2023-04-15 DIAGNOSIS — J9601 Acute respiratory failure with hypoxia: Secondary | ICD-10-CM | POA: Diagnosis not present

## 2023-04-15 DIAGNOSIS — I5043 Acute on chronic combined systolic (congestive) and diastolic (congestive) heart failure: Secondary | ICD-10-CM | POA: Diagnosis not present

## 2023-04-18 ENCOUNTER — Other Ambulatory Visit: Payer: Self-pay | Admitting: Family Medicine

## 2023-04-22 DIAGNOSIS — F411 Generalized anxiety disorder: Secondary | ICD-10-CM | POA: Diagnosis not present

## 2023-04-22 DIAGNOSIS — I2510 Atherosclerotic heart disease of native coronary artery without angina pectoris: Secondary | ICD-10-CM | POA: Diagnosis not present

## 2023-04-22 DIAGNOSIS — I110 Hypertensive heart disease with heart failure: Secondary | ICD-10-CM | POA: Diagnosis not present

## 2023-04-22 DIAGNOSIS — I5043 Acute on chronic combined systolic (congestive) and diastolic (congestive) heart failure: Secondary | ICD-10-CM | POA: Diagnosis not present

## 2023-04-22 DIAGNOSIS — I69354 Hemiplegia and hemiparesis following cerebral infarction affecting left non-dominant side: Secondary | ICD-10-CM | POA: Diagnosis not present

## 2023-04-22 DIAGNOSIS — J9601 Acute respiratory failure with hypoxia: Secondary | ICD-10-CM | POA: Diagnosis not present

## 2023-05-12 NOTE — Progress Notes (Signed)
Cardiology Office Note  Date: 05/13/2023   ID: Craig Fields, Craig Fields 23, 1942, MRN [id number]  History of Present Illness: Craig Fields is an 82 y.o. male last seen in [place] by Ms. Strader PA-C, I reviewed the note.  He is here today with family members.  PT/OT finished earlier in the month, it sounds like he plateaued in terms of improvement and is still very limited in mobility.  He is able to stand with assistance, can sit up, but no regular ambulation.  I reviewed his medications.  Current cardiac regimen includes amiodarone, Eliquis, Lasix, Cozaar, potassium supplement, Zocor, and Aldactone.  We went over home blood pressure and heart rate checks, not able to advance GDMT effectively given concerns for lower blood pressure (doubt he would tolerate a switch to [place]) and also infection risk with immobility (not an optimal candidate for SGLT2 inhibitor).  I do not think that it makes sense to pursue TAVR evaluation now, particular in light of his limited functional capacity.  He and his daughter also agree with this after discussion.  For now preference is also to hold off on follow-up cardiac imaging.  Physical Exam: VS:  BP (!) 118/58   Pulse 72   Ht 5\' 7"  (1.702 m)   SpO2 95%   BMI 26.88 kg/m , BMI Body mass index is 26.88 kg/m.  Wt Readings from Last 3 Encounters:  12/22/22 171 lb 9.6 oz (77.8 kg)  12/15/22 174 lb (78.9 kg)  12/15/22 174 lb (78.9 kg)    General: Patient appears comfortable at rest. HEENT: Conjunctiva and lids normal. Neck: Supple, no elevated JVP or carotid bruits. Lungs: Clear to auscultation, nonlabored breathing at rest. Cardiac: Regular rate and rhythm, no S3, 2/6 systolic murmur. Extremities: Lower leg edema, brace on left leg.  ECG:  An ECG dated 12/15/2022 was personally reviewed today and demonstrated:  Sinus rhythm with probable old inferior infarct pattern, nonspecific ST-T changes.  Labwork: 11/22/2022: B Natriuretic Peptide  2,168.0 11/25/2022: Magnesium 1.7 04/05/2023: ALT 14; AST 25; BUN 29; Creatinine, Ser 1.17; Hemoglobin 11.0; Platelets 167; Potassium 4.4; Sodium 138; TSH 5.030     Component Value Date/Time   CHOL 126 04/05/2023 1336   TRIG 54 04/05/2023 1336   HDL 62 04/05/2023 1336   CHOLHDL 2.0 04/05/2023 1336   CHOLHDL 3.5 01/01/2020 1620   VLDL 38 (H) 04/19/2017 0914   LDLCALC 52 04/05/2023 1336   LDLCALC 78 01/01/2020 1620   Other Studies Reviewed Today:  No interval cardiac testing for review today.  Assessment and Plan:  1.  Degenerative calcific aortic stenosis, suspect low-flow/low gradient severe stenosis with recent echocardiogram demonstrating mean AV gradient 27 mmHg and dimensionless index 0.20.  Also has associated moderate aortic regurgitation.  Following discussion with patient and family we will hold off on TAVR referral, I do not think that he is an optimal candidate in light of very limited mobility at baseline and general prognosis in light of his cardiomyopathy and history of stroke.  We will also hold off on further imaging at this time.   2.  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  GDMT limited by blood pressure and also immobility as discussed above.  Continue Cozaar, Aldactone, Lasix, and potassium supplement.   3.  Paroxysmal atrial fibrillation, CHA2DS2-VASc score is 7.  Based on home heart rate checks it looks like rhythm control has been fairly good overall.  He does not report  any palpitations and continues on amiodarone along with Eliquis.  No spontaneous bleeding problems reported.   4.  Multivessel CAD status post CABG in 2004 with LIMA to LAD, SVG to ramus intermedius, SVG to OM 2, and SVG to PDA.  No active angina at current level of activity.  Continue Zocor.   5.  History of stroke with left-sided hemiparesis.   6.  Essential hypertension by history.  7.  Mixed hyperlipidemia.  He  continues on Zocor.  Disposition:  Follow up  6 months.  Signed, Jonelle Sidle, M.D., F.A.C.C. [facility]

## 2023-05-13 ENCOUNTER — Ambulatory Visit: Payer: Medicare Other | Admitting: Cardiology

## 2023-05-13 ENCOUNTER — Encounter: Payer: Self-pay | Admitting: Cardiology

## 2023-05-13 VITALS — BP 118/58 | HR 72 | Ht 67.0 in

## 2023-05-13 DIAGNOSIS — I5020 Unspecified systolic (congestive) heart failure: Secondary | ICD-10-CM | POA: Diagnosis not present

## 2023-05-13 DIAGNOSIS — I350 Nonrheumatic aortic (valve) stenosis: Secondary | ICD-10-CM | POA: Insufficient documentation

## 2023-05-13 DIAGNOSIS — I10 Essential (primary) hypertension: Secondary | ICD-10-CM

## 2023-05-13 DIAGNOSIS — I480 Paroxysmal atrial fibrillation: Secondary | ICD-10-CM | POA: Diagnosis not present

## 2023-05-13 DIAGNOSIS — I25119 Atherosclerotic heart disease of native coronary artery with unspecified angina pectoris: Secondary | ICD-10-CM

## 2023-05-13 NOTE — Patient Instructions (Signed)
Medication Instructions:  Your physician recommends that you continue on your current medications as directed. Please refer to the Current Medication list given to you today.   Labwork: None today  Testing/Procedures: None today  Follow-Up: 6 months  Any Other Special Instructions Will Be Listed Below (If Applicable).  If you need a refill on your cardiac medications before your next appointment, please call your pharmacy.  

## 2023-05-22 ENCOUNTER — Other Ambulatory Visit: Payer: Self-pay | Admitting: Family Medicine

## 2023-06-19 ENCOUNTER — Other Ambulatory Visit: Payer: Self-pay | Admitting: Family Medicine

## 2023-07-25 ENCOUNTER — Ambulatory Visit: Payer: Self-pay | Admitting: Family Medicine

## 2023-07-25 NOTE — Telephone Encounter (Signed)
  Chief Complaint: Fluid leaking from left arm-left arm swollen with pitting edema Symptoms: serosanginous fluid leaking from left arm with pitting edema  Frequency: on and off but constant for past several days Pertinent Negatives: Patient denies SOB, fever, pain Disposition: [x] ED /[] Urgent Care (no appt availability in office) / [] Appointment(In office/virtual)/ []  [place] [facility]/ [] Home Care/ [] Refused Recommended Disposition /[] [place] [facility]/ []  Follow-up with PCP Additional Notes: Advised patient wife to proceed to ED given patient's complex history and possibility of infection due to fluid leaking. Patient wife verbalized understanding       Reason for Disposition  SEVERE arm swelling (e.g., all of arm looks swollen)  Answer Assessment - Initial Assessment Questions 1. ONSET: "When did the swelling start?" (e.g., minutes, hours, days)     On and off 2. LOCATION: "What part of the arm is swollen?"  "Are both arms swollen or just one arm?"     Left arm that is paralyzed from stroke 3. SEVERITY: "How bad is the swelling?" (e.g., localized; mild, moderate, severe)   - LOCALIZED: Small area of puffiness or swelling on just one arm   - JOINT SWELLING: Swelling of one joint   - MILD: Puffiness or swelling of hand   - MODERATE: Puffiness or swollen feeling of entire arm    - SEVERE: All of arm looks swollen; pitting edema     severe 4. REDNESS: "Does the swelling look red or infected?"     no 5. PAIN: "Is the swelling painful to touch?" If Yes, ask: "How painful is it?"   (Scale 1-10; mild, moderate or severe)     Not hurting -1 6. FEVER: "Do you have a fever?" If Yes, ask: "What is it, how was it measured, and when did it start?"      no 7. CAUSE: "What do you think is causing the arm swelling?"     unsure 8. MEDICAL HISTORY: "Do you have a history of heart failure, kidney disease, liver failure, or cancer?"     Yes-heart disease 9. RECURRENT SYMPTOM: "Have you  had arm swelling before?" If Yes, ask: "When was the last time?" "What happened that time?"     Yes-on and off 10. OTHER SYMPTOMS: "Do you have any other symptoms?" (e.g., chest pain, difficulty breathing)       No 11. PREGNANCY: "Is there any chance you are pregnant?" "When was your last menstrual period?"       N/a  Protocols used: Arm Swelling and Edema-A-AH

## 2023-07-26 ENCOUNTER — Telehealth: Payer: Self-pay | Admitting: Cardiology

## 2023-07-26 DIAGNOSIS — Z79899 Other long term (current) drug therapy: Secondary | ICD-10-CM

## 2023-07-26 DIAGNOSIS — I5020 Unspecified systolic (congestive) heart failure: Secondary | ICD-10-CM

## 2023-07-26 NOTE — Telephone Encounter (Signed)
Pt c/o swelling/edema: STAT if pt has developed SOB within 24 hours  If swelling, where is the swelling located?   Arms, legs and above hip  How much weight have you gained and in what time span?     Have you gained 2 pounds in a day or 5 pounds in a week?   Do you have a log of your daily weights (if so, list)?   No  Are you currently taking a fluid pill?   Are you currently SOB?   Yes  Have you traveled recently in a car or plane for an extended period of time?   Wife stated today patient's blood pressure has been trending low - BP 90/53  HR 66.  Wife wants to know if his next steps.

## 2023-07-26 NOTE — Telephone Encounter (Signed)
Pt's wife states that pt is retaining fluid. Wife states that pt has SOB with exertion. She states that he has swelling to the lower extremities, belly and left arm. Wife describes pitting edema to the legs. Pt does not elevate legs when sitting. She states that his arm is "leaking". Wife is unable to weigh patient daily. Please advise.

## 2023-07-27 ENCOUNTER — Telehealth: Payer: Self-pay | Admitting: Family Medicine

## 2023-07-27 ENCOUNTER — Telehealth: Payer: Self-pay | Admitting: Cardiology

## 2023-07-27 DIAGNOSIS — I5020 Unspecified systolic (congestive) heart failure: Secondary | ICD-10-CM

## 2023-07-27 MED ORDER — FUROSEMIDE 40 MG PO TABS: 80.0000 mg | ORAL_TABLET | Freq: Every day | ORAL | 3 refills | Status: DC

## 2023-07-27 NOTE — Telephone Encounter (Signed)
Copied from CRM [id number]. Topic: Medical Record Request - Provider/Facility Request >> Jul 27, 2023 11:01 AM Eunice Blase wrote: Reason for CRM: Neysa Bonito with [facility] [phone] called regarding palliative care for pt. If you have any questions please contact Christy.

## 2023-07-27 NOTE — Telephone Encounter (Signed)
Craig Fields with [facility] states they received palliative care order, but because of their location they can only do virtual visits. Craig Fields states wife prefers in person visits. Order was also for weekly visits, but they would not be able to see patient that often, so they recommend going through another agent. If questions, call may be returned to [phone] (option#: 2)

## 2023-07-27 NOTE — Telephone Encounter (Signed)
Wife called to ask that the lab request be sent to [facility]. Please advise

## 2023-07-27 NOTE — Telephone Encounter (Signed)
Referral entered for [facility].

## 2023-07-27 NOTE — Telephone Encounter (Signed)
Lab order changed to [facility]. Lab slip mailed to pt's home.

## 2023-07-27 NOTE — Telephone Encounter (Signed)
Spoke to pt's wife who verbalized understanding. Pt's wife did request we send the referral in for Palliative care. Pt's wife had no questions or concerns at this time.

## 2023-07-27 NOTE — Addendum Note (Signed)
Addended by: Leonides Schanz C on: 07/27/2023 09:16 AM   Modules accepted: Orders

## 2023-07-28 NOTE — Addendum Note (Signed)
Addended by: Kerney Elbe on: 07/28/2023 03:15 PM   Modules accepted: Orders

## 2023-07-29 DIAGNOSIS — Z515 Encounter for palliative care: Secondary | ICD-10-CM | POA: Diagnosis not present

## 2023-07-29 DIAGNOSIS — I259 Chronic ischemic heart disease, unspecified: Secondary | ICD-10-CM | POA: Diagnosis not present

## 2023-07-29 DIAGNOSIS — E786 Lipoprotein deficiency: Secondary | ICD-10-CM | POA: Diagnosis not present

## 2023-07-29 DIAGNOSIS — Z66 Do not resuscitate: Secondary | ICD-10-CM | POA: Diagnosis not present

## 2023-07-29 DIAGNOSIS — E440 Moderate protein-calorie malnutrition: Secondary | ICD-10-CM | POA: Diagnosis not present

## 2023-07-29 DIAGNOSIS — I5042 Chronic combined systolic (congestive) and diastolic (congestive) heart failure: Secondary | ICD-10-CM | POA: Diagnosis not present

## 2023-07-29 DIAGNOSIS — I10 Essential (primary) hypertension: Secondary | ICD-10-CM | POA: Diagnosis not present

## 2023-07-29 DIAGNOSIS — I350 Nonrheumatic aortic (valve) stenosis: Secondary | ICD-10-CM | POA: Diagnosis not present

## 2023-07-29 DIAGNOSIS — I359 Nonrheumatic aortic valve disorder, unspecified: Secondary | ICD-10-CM | POA: Diagnosis not present

## 2023-07-29 DIAGNOSIS — J9600 Acute respiratory failure, unspecified whether with hypoxia or hypercapnia: Secondary | ICD-10-CM | POA: Diagnosis not present

## 2023-07-29 DIAGNOSIS — R911 Solitary pulmonary nodule: Secondary | ICD-10-CM | POA: Diagnosis not present

## 2023-07-29 DIAGNOSIS — I69959 Hemiplegia and hemiparesis following unspecified cerebrovascular disease affecting unspecified side: Secondary | ICD-10-CM | POA: Diagnosis not present

## 2023-07-30 DIAGNOSIS — R911 Solitary pulmonary nodule: Secondary | ICD-10-CM | POA: Diagnosis not present

## 2023-07-30 DIAGNOSIS — I69959 Hemiplegia and hemiparesis following unspecified cerebrovascular disease affecting unspecified side: Secondary | ICD-10-CM | POA: Diagnosis not present

## 2023-07-30 DIAGNOSIS — J9600 Acute respiratory failure, unspecified whether with hypoxia or hypercapnia: Secondary | ICD-10-CM | POA: Diagnosis not present

## 2023-07-30 DIAGNOSIS — I359 Nonrheumatic aortic valve disorder, unspecified: Secondary | ICD-10-CM | POA: Diagnosis not present

## 2023-07-30 DIAGNOSIS — I10 Essential (primary) hypertension: Secondary | ICD-10-CM | POA: Diagnosis not present

## 2023-07-30 DIAGNOSIS — I5042 Chronic combined systolic (congestive) and diastolic (congestive) heart failure: Secondary | ICD-10-CM | POA: Diagnosis not present

## 2023-08-01 DIAGNOSIS — I10 Essential (primary) hypertension: Secondary | ICD-10-CM | POA: Diagnosis not present

## 2023-08-01 DIAGNOSIS — I5042 Chronic combined systolic (congestive) and diastolic (congestive) heart failure: Secondary | ICD-10-CM | POA: Diagnosis not present

## 2023-08-01 DIAGNOSIS — I359 Nonrheumatic aortic valve disorder, unspecified: Secondary | ICD-10-CM | POA: Diagnosis not present

## 2023-08-01 DIAGNOSIS — J9600 Acute respiratory failure, unspecified whether with hypoxia or hypercapnia: Secondary | ICD-10-CM | POA: Diagnosis not present

## 2023-08-01 DIAGNOSIS — R911 Solitary pulmonary nodule: Secondary | ICD-10-CM | POA: Diagnosis not present

## 2023-08-01 DIAGNOSIS — I69959 Hemiplegia and hemiparesis following unspecified cerebrovascular disease affecting unspecified side: Secondary | ICD-10-CM | POA: Diagnosis not present

## 2023-08-05 ENCOUNTER — Ambulatory Visit: Payer: Medicare Other | Admitting: Family Medicine

## 2023-08-05 DIAGNOSIS — I69959 Hemiplegia and hemiparesis following unspecified cerebrovascular disease affecting unspecified side: Secondary | ICD-10-CM | POA: Diagnosis not present

## 2023-08-05 DIAGNOSIS — I10 Essential (primary) hypertension: Secondary | ICD-10-CM | POA: Diagnosis not present

## 2023-08-05 DIAGNOSIS — I5042 Chronic combined systolic (congestive) and diastolic (congestive) heart failure: Secondary | ICD-10-CM | POA: Diagnosis not present

## 2023-08-05 DIAGNOSIS — I359 Nonrheumatic aortic valve disorder, unspecified: Secondary | ICD-10-CM | POA: Diagnosis not present

## 2023-08-05 DIAGNOSIS — R911 Solitary pulmonary nodule: Secondary | ICD-10-CM | POA: Diagnosis not present

## 2023-08-05 DIAGNOSIS — J9600 Acute respiratory failure, unspecified whether with hypoxia or hypercapnia: Secondary | ICD-10-CM | POA: Diagnosis not present

## 2023-08-08 DIAGNOSIS — I5042 Chronic combined systolic (congestive) and diastolic (congestive) heart failure: Secondary | ICD-10-CM | POA: Diagnosis not present

## 2023-08-08 DIAGNOSIS — J9600 Acute respiratory failure, unspecified whether with hypoxia or hypercapnia: Secondary | ICD-10-CM | POA: Diagnosis not present

## 2023-08-08 DIAGNOSIS — I69959 Hemiplegia and hemiparesis following unspecified cerebrovascular disease affecting unspecified side: Secondary | ICD-10-CM | POA: Diagnosis not present

## 2023-08-08 DIAGNOSIS — R911 Solitary pulmonary nodule: Secondary | ICD-10-CM | POA: Diagnosis not present

## 2023-08-08 DIAGNOSIS — I10 Essential (primary) hypertension: Secondary | ICD-10-CM | POA: Diagnosis not present

## 2023-08-08 DIAGNOSIS — I359 Nonrheumatic aortic valve disorder, unspecified: Secondary | ICD-10-CM | POA: Diagnosis not present

## 2023-08-14 DIAGNOSIS — I359 Nonrheumatic aortic valve disorder, unspecified: Secondary | ICD-10-CM | POA: Diagnosis not present

## 2023-08-14 DIAGNOSIS — E786 Lipoprotein deficiency: Secondary | ICD-10-CM | POA: Diagnosis not present

## 2023-08-14 DIAGNOSIS — I69959 Hemiplegia and hemiparesis following unspecified cerebrovascular disease affecting unspecified side: Secondary | ICD-10-CM | POA: Diagnosis not present

## 2023-08-14 DIAGNOSIS — E440 Moderate protein-calorie malnutrition: Secondary | ICD-10-CM | POA: Diagnosis not present

## 2023-08-14 DIAGNOSIS — R911 Solitary pulmonary nodule: Secondary | ICD-10-CM | POA: Diagnosis not present

## 2023-08-14 DIAGNOSIS — I350 Nonrheumatic aortic (valve) stenosis: Secondary | ICD-10-CM | POA: Diagnosis not present

## 2023-08-14 DIAGNOSIS — I10 Essential (primary) hypertension: Secondary | ICD-10-CM | POA: Diagnosis not present

## 2023-08-14 DIAGNOSIS — Z515 Encounter for palliative care: Secondary | ICD-10-CM | POA: Diagnosis not present

## 2023-08-14 DIAGNOSIS — Z66 Do not resuscitate: Secondary | ICD-10-CM | POA: Diagnosis not present

## 2023-08-14 DIAGNOSIS — I5042 Chronic combined systolic (congestive) and diastolic (congestive) heart failure: Secondary | ICD-10-CM | POA: Diagnosis not present

## 2023-08-14 DIAGNOSIS — I259 Chronic ischemic heart disease, unspecified: Secondary | ICD-10-CM | POA: Diagnosis not present

## 2023-08-14 DIAGNOSIS — J9600 Acute respiratory failure, unspecified whether with hypoxia or hypercapnia: Secondary | ICD-10-CM | POA: Diagnosis not present

## 2023-08-15 DIAGNOSIS — J9600 Acute respiratory failure, unspecified whether with hypoxia or hypercapnia: Secondary | ICD-10-CM | POA: Diagnosis not present

## 2023-08-15 DIAGNOSIS — I10 Essential (primary) hypertension: Secondary | ICD-10-CM | POA: Diagnosis not present

## 2023-08-15 DIAGNOSIS — I69959 Hemiplegia and hemiparesis following unspecified cerebrovascular disease affecting unspecified side: Secondary | ICD-10-CM | POA: Diagnosis not present

## 2023-08-15 DIAGNOSIS — I359 Nonrheumatic aortic valve disorder, unspecified: Secondary | ICD-10-CM | POA: Diagnosis not present

## 2023-08-15 DIAGNOSIS — R911 Solitary pulmonary nodule: Secondary | ICD-10-CM | POA: Diagnosis not present

## 2023-08-15 DIAGNOSIS — I5042 Chronic combined systolic (congestive) and diastolic (congestive) heart failure: Secondary | ICD-10-CM | POA: Diagnosis not present

## 2023-08-22 DIAGNOSIS — I5042 Chronic combined systolic (congestive) and diastolic (congestive) heart failure: Secondary | ICD-10-CM | POA: Diagnosis not present

## 2023-08-22 DIAGNOSIS — R911 Solitary pulmonary nodule: Secondary | ICD-10-CM | POA: Diagnosis not present

## 2023-08-22 DIAGNOSIS — I359 Nonrheumatic aortic valve disorder, unspecified: Secondary | ICD-10-CM | POA: Diagnosis not present

## 2023-08-22 DIAGNOSIS — I69959 Hemiplegia and hemiparesis following unspecified cerebrovascular disease affecting unspecified side: Secondary | ICD-10-CM | POA: Diagnosis not present

## 2023-08-22 DIAGNOSIS — J9600 Acute respiratory failure, unspecified whether with hypoxia or hypercapnia: Secondary | ICD-10-CM | POA: Diagnosis not present

## 2023-08-22 DIAGNOSIS — I10 Essential (primary) hypertension: Secondary | ICD-10-CM | POA: Diagnosis not present

## 2023-08-29 DIAGNOSIS — I69959 Hemiplegia and hemiparesis following unspecified cerebrovascular disease affecting unspecified side: Secondary | ICD-10-CM | POA: Diagnosis not present

## 2023-08-29 DIAGNOSIS — R911 Solitary pulmonary nodule: Secondary | ICD-10-CM | POA: Diagnosis not present

## 2023-08-29 DIAGNOSIS — J9600 Acute respiratory failure, unspecified whether with hypoxia or hypercapnia: Secondary | ICD-10-CM | POA: Diagnosis not present

## 2023-08-29 DIAGNOSIS — I10 Essential (primary) hypertension: Secondary | ICD-10-CM | POA: Diagnosis not present

## 2023-08-29 DIAGNOSIS — I5042 Chronic combined systolic (congestive) and diastolic (congestive) heart failure: Secondary | ICD-10-CM | POA: Diagnosis not present

## 2023-08-29 DIAGNOSIS — I359 Nonrheumatic aortic valve disorder, unspecified: Secondary | ICD-10-CM | POA: Diagnosis not present

## 2023-08-30 DIAGNOSIS — I69959 Hemiplegia and hemiparesis following unspecified cerebrovascular disease affecting unspecified side: Secondary | ICD-10-CM | POA: Diagnosis not present

## 2023-08-30 DIAGNOSIS — J9600 Acute respiratory failure, unspecified whether with hypoxia or hypercapnia: Secondary | ICD-10-CM | POA: Diagnosis not present

## 2023-08-30 DIAGNOSIS — I359 Nonrheumatic aortic valve disorder, unspecified: Secondary | ICD-10-CM | POA: Diagnosis not present

## 2023-08-30 DIAGNOSIS — I5042 Chronic combined systolic (congestive) and diastolic (congestive) heart failure: Secondary | ICD-10-CM | POA: Diagnosis not present

## 2023-08-30 DIAGNOSIS — I10 Essential (primary) hypertension: Secondary | ICD-10-CM | POA: Diagnosis not present

## 2023-08-30 DIAGNOSIS — R911 Solitary pulmonary nodule: Secondary | ICD-10-CM | POA: Diagnosis not present

## 2023-09-03 DIAGNOSIS — I5042 Chronic combined systolic (congestive) and diastolic (congestive) heart failure: Secondary | ICD-10-CM | POA: Diagnosis not present

## 2023-09-03 DIAGNOSIS — I359 Nonrheumatic aortic valve disorder, unspecified: Secondary | ICD-10-CM | POA: Diagnosis not present

## 2023-09-03 DIAGNOSIS — I10 Essential (primary) hypertension: Secondary | ICD-10-CM | POA: Diagnosis not present

## 2023-09-03 DIAGNOSIS — I69959 Hemiplegia and hemiparesis following unspecified cerebrovascular disease affecting unspecified side: Secondary | ICD-10-CM | POA: Diagnosis not present

## 2023-09-03 DIAGNOSIS — R911 Solitary pulmonary nodule: Secondary | ICD-10-CM | POA: Diagnosis not present

## 2023-09-03 DIAGNOSIS — J9600 Acute respiratory failure, unspecified whether with hypoxia or hypercapnia: Secondary | ICD-10-CM | POA: Diagnosis not present

## 2023-09-08 DIAGNOSIS — J9600 Acute respiratory failure, unspecified whether with hypoxia or hypercapnia: Secondary | ICD-10-CM | POA: Diagnosis not present

## 2023-09-08 DIAGNOSIS — I10 Essential (primary) hypertension: Secondary | ICD-10-CM | POA: Diagnosis not present

## 2023-09-08 DIAGNOSIS — I359 Nonrheumatic aortic valve disorder, unspecified: Secondary | ICD-10-CM | POA: Diagnosis not present

## 2023-09-08 DIAGNOSIS — I5042 Chronic combined systolic (congestive) and diastolic (congestive) heart failure: Secondary | ICD-10-CM | POA: Diagnosis not present

## 2023-09-08 DIAGNOSIS — R911 Solitary pulmonary nodule: Secondary | ICD-10-CM | POA: Diagnosis not present

## 2023-09-08 DIAGNOSIS — I69959 Hemiplegia and hemiparesis following unspecified cerebrovascular disease affecting unspecified side: Secondary | ICD-10-CM | POA: Diagnosis not present

## 2023-09-09 DIAGNOSIS — I10 Essential (primary) hypertension: Secondary | ICD-10-CM | POA: Diagnosis not present

## 2023-09-09 DIAGNOSIS — J9600 Acute respiratory failure, unspecified whether with hypoxia or hypercapnia: Secondary | ICD-10-CM | POA: Diagnosis not present

## 2023-09-09 DIAGNOSIS — I5042 Chronic combined systolic (congestive) and diastolic (congestive) heart failure: Secondary | ICD-10-CM | POA: Diagnosis not present

## 2023-09-09 DIAGNOSIS — I69959 Hemiplegia and hemiparesis following unspecified cerebrovascular disease affecting unspecified side: Secondary | ICD-10-CM | POA: Diagnosis not present

## 2023-09-09 DIAGNOSIS — I359 Nonrheumatic aortic valve disorder, unspecified: Secondary | ICD-10-CM | POA: Diagnosis not present

## 2023-09-09 DIAGNOSIS — R911 Solitary pulmonary nodule: Secondary | ICD-10-CM | POA: Diagnosis not present

## 2023-09-12 DIAGNOSIS — I10 Essential (primary) hypertension: Secondary | ICD-10-CM | POA: Diagnosis not present

## 2023-09-12 DIAGNOSIS — I69959 Hemiplegia and hemiparesis following unspecified cerebrovascular disease affecting unspecified side: Secondary | ICD-10-CM | POA: Diagnosis not present

## 2023-09-12 DIAGNOSIS — J9600 Acute respiratory failure, unspecified whether with hypoxia or hypercapnia: Secondary | ICD-10-CM | POA: Diagnosis not present

## 2023-09-12 DIAGNOSIS — I5042 Chronic combined systolic (congestive) and diastolic (congestive) heart failure: Secondary | ICD-10-CM | POA: Diagnosis not present

## 2023-09-12 DIAGNOSIS — R911 Solitary pulmonary nodule: Secondary | ICD-10-CM | POA: Diagnosis not present

## 2023-09-12 DIAGNOSIS — I359 Nonrheumatic aortic valve disorder, unspecified: Secondary | ICD-10-CM | POA: Diagnosis not present

## 2023-09-14 DIAGNOSIS — I69959 Hemiplegia and hemiparesis following unspecified cerebrovascular disease affecting unspecified side: Secondary | ICD-10-CM | POA: Diagnosis not present

## 2023-09-14 DIAGNOSIS — Z515 Encounter for palliative care: Secondary | ICD-10-CM | POA: Diagnosis not present

## 2023-09-14 DIAGNOSIS — I350 Nonrheumatic aortic (valve) stenosis: Secondary | ICD-10-CM | POA: Diagnosis not present

## 2023-09-14 DIAGNOSIS — E440 Moderate protein-calorie malnutrition: Secondary | ICD-10-CM | POA: Diagnosis not present

## 2023-09-14 DIAGNOSIS — J9600 Acute respiratory failure, unspecified whether with hypoxia or hypercapnia: Secondary | ICD-10-CM | POA: Diagnosis not present

## 2023-09-14 DIAGNOSIS — R911 Solitary pulmonary nodule: Secondary | ICD-10-CM | POA: Diagnosis not present

## 2023-09-14 DIAGNOSIS — I10 Essential (primary) hypertension: Secondary | ICD-10-CM | POA: Diagnosis not present

## 2023-09-14 DIAGNOSIS — Z66 Do not resuscitate: Secondary | ICD-10-CM | POA: Diagnosis not present

## 2023-09-14 DIAGNOSIS — I359 Nonrheumatic aortic valve disorder, unspecified: Secondary | ICD-10-CM | POA: Diagnosis not present

## 2023-09-14 DIAGNOSIS — I259 Chronic ischemic heart disease, unspecified: Secondary | ICD-10-CM | POA: Diagnosis not present

## 2023-09-14 DIAGNOSIS — I5042 Chronic combined systolic (congestive) and diastolic (congestive) heart failure: Secondary | ICD-10-CM | POA: Diagnosis not present

## 2023-09-14 DIAGNOSIS — E786 Lipoprotein deficiency: Secondary | ICD-10-CM | POA: Diagnosis not present

## 2023-09-17 DIAGNOSIS — R911 Solitary pulmonary nodule: Secondary | ICD-10-CM | POA: Diagnosis not present

## 2023-09-17 DIAGNOSIS — I5042 Chronic combined systolic (congestive) and diastolic (congestive) heart failure: Secondary | ICD-10-CM | POA: Diagnosis not present

## 2023-09-17 DIAGNOSIS — I359 Nonrheumatic aortic valve disorder, unspecified: Secondary | ICD-10-CM | POA: Diagnosis not present

## 2023-09-17 DIAGNOSIS — I69959 Hemiplegia and hemiparesis following unspecified cerebrovascular disease affecting unspecified side: Secondary | ICD-10-CM | POA: Diagnosis not present

## 2023-09-17 DIAGNOSIS — J9600 Acute respiratory failure, unspecified whether with hypoxia or hypercapnia: Secondary | ICD-10-CM | POA: Diagnosis not present

## 2023-09-17 DIAGNOSIS — I10 Essential (primary) hypertension: Secondary | ICD-10-CM | POA: Diagnosis not present

## 2023-09-19 DIAGNOSIS — I10 Essential (primary) hypertension: Secondary | ICD-10-CM | POA: Diagnosis not present

## 2023-09-19 DIAGNOSIS — I69959 Hemiplegia and hemiparesis following unspecified cerebrovascular disease affecting unspecified side: Secondary | ICD-10-CM | POA: Diagnosis not present

## 2023-09-19 DIAGNOSIS — R911 Solitary pulmonary nodule: Secondary | ICD-10-CM | POA: Diagnosis not present

## 2023-09-19 DIAGNOSIS — I359 Nonrheumatic aortic valve disorder, unspecified: Secondary | ICD-10-CM | POA: Diagnosis not present

## 2023-09-19 DIAGNOSIS — I5042 Chronic combined systolic (congestive) and diastolic (congestive) heart failure: Secondary | ICD-10-CM | POA: Diagnosis not present

## 2023-09-19 DIAGNOSIS — J9600 Acute respiratory failure, unspecified whether with hypoxia or hypercapnia: Secondary | ICD-10-CM | POA: Diagnosis not present

## 2023-09-24 DIAGNOSIS — I10 Essential (primary) hypertension: Secondary | ICD-10-CM | POA: Diagnosis not present

## 2023-09-24 DIAGNOSIS — R911 Solitary pulmonary nodule: Secondary | ICD-10-CM | POA: Diagnosis not present

## 2023-09-24 DIAGNOSIS — I5042 Chronic combined systolic (congestive) and diastolic (congestive) heart failure: Secondary | ICD-10-CM | POA: Diagnosis not present

## 2023-09-24 DIAGNOSIS — J9600 Acute respiratory failure, unspecified whether with hypoxia or hypercapnia: Secondary | ICD-10-CM | POA: Diagnosis not present

## 2023-09-24 DIAGNOSIS — I69959 Hemiplegia and hemiparesis following unspecified cerebrovascular disease affecting unspecified side: Secondary | ICD-10-CM | POA: Diagnosis not present

## 2023-09-24 DIAGNOSIS — I359 Nonrheumatic aortic valve disorder, unspecified: Secondary | ICD-10-CM | POA: Diagnosis not present

## 2023-09-26 DIAGNOSIS — I5042 Chronic combined systolic (congestive) and diastolic (congestive) heart failure: Secondary | ICD-10-CM | POA: Diagnosis not present

## 2023-09-26 DIAGNOSIS — R911 Solitary pulmonary nodule: Secondary | ICD-10-CM | POA: Diagnosis not present

## 2023-09-26 DIAGNOSIS — J9600 Acute respiratory failure, unspecified whether with hypoxia or hypercapnia: Secondary | ICD-10-CM | POA: Diagnosis not present

## 2023-09-26 DIAGNOSIS — I359 Nonrheumatic aortic valve disorder, unspecified: Secondary | ICD-10-CM | POA: Diagnosis not present

## 2023-09-26 DIAGNOSIS — I10 Essential (primary) hypertension: Secondary | ICD-10-CM | POA: Diagnosis not present

## 2023-09-26 DIAGNOSIS — I69959 Hemiplegia and hemiparesis following unspecified cerebrovascular disease affecting unspecified side: Secondary | ICD-10-CM | POA: Diagnosis not present

## 2023-10-15 DEATH — deceased

## 2023-11-03 ENCOUNTER — Telehealth: Payer: Self-pay | Admitting: Cardiology

## 2023-11-03 NOTE — Telephone Encounter (Signed)
I spoke with wife and extended our condolences . I will let Dr.McDowell know.

## 2023-11-03 NOTE — Telephone Encounter (Signed)
Wife Reita Cliche) called to report patient passed away 09-29-23.

## 2023-11-29 NOTE — Patient Instructions (Signed)
 Visit Information  Thank you for taking time to visit with me today. Please don't hesitate to contact me if I can be of assistance to you.   Following are the goals we discussed today:   Goals Addressed             This Visit's Progress    obtain home health visits [facility])       Interventions Today    Flowsheet Row Most Recent Value  Chronic Disease   Chronic disease during today's visit Congestive Heart Failure (CHF)  General Interventions   General Interventions Discussed/Reviewed General Interventions Discussed, Doctor Visits, Community Resources  Doctor Visits Discussed/Reviewed Doctor Visits Discussed, PCP  PCP/Specialist Visits Compliance with follow-up visit  Exercise Interventions   Exercise Discussed/Reviewed Exercise Discussed, Physical Activity  Physical Activity Discussed/Reviewed Physical Activity Discussed, Home Exercise Program (HEP)  Education Interventions   Education Provided Provided Education  Provided Verbal Education On Medication, Community Resources, When to see the doctor  [home health services duration in homes]  Mental Health Interventions   Mental Health Discussed/Reviewed Mental Health Discussed, Coping Strategies  Nutrition Interventions   Nutrition Discussed/Reviewed Nutrition Discussed, Fluid intake  Pharmacy Interventions   Pharmacy Dicussed/Reviewed Pharmacy Topics Discussed, Affording Medications  Safety Interventions   Safety Discussed/Reviewed Safety Discussed, Home Safety  Home Safety Assistive Devices              Our next appointment is by telephone na  on na at na  Please call the care guide team at [phone] if you need to cancel or reschedule your appointment.   If you are experiencing a Mental Health or Behavioral Health Crisis or need someone to talk to, please call the Suicide and Crisis Lifeline: 988 call the [place] National Suicide Prevention Lifeline: (931)009-9082 or TTY: 939-839-6307 TTY (786)732-5698) to talk to a  trained counselor call 1-800-273-TALK (toll free, 24 hour hotline) call the [facility]: 4168280108 call 911   No preference for AVS copy  The patient has been provided with contact information for the care management team and has been advised to call with any health related questions or concerns.   Anael Rosch L. Noelle Penner, RN, BSN, CCM [facility] RN Care Manager [phone]

## 2023-12-06 ENCOUNTER — Ambulatory Visit: Payer: Medicare Other | Admitting: Cardiology
